# Patient Record
Sex: Female | Born: 1965 | Race: White | Hispanic: No | Smoking: Never smoker
Health system: Southern US, Community
[De-identification: ages and names within clinical notes are randomized; demographics above are authoritative.]

## PROBLEM LIST (undated history)

## (undated) DIAGNOSIS — I509 Heart failure, unspecified: Secondary | ICD-10-CM

## (undated) DIAGNOSIS — G4730 Sleep apnea, unspecified: Secondary | ICD-10-CM

## (undated) DIAGNOSIS — F32A Depression, unspecified: Secondary | ICD-10-CM

## (undated) DIAGNOSIS — I639 Cerebral infarction, unspecified: Secondary | ICD-10-CM

## (undated) DIAGNOSIS — F419 Anxiety disorder, unspecified: Secondary | ICD-10-CM

## (undated) DIAGNOSIS — Z9889 Other specified postprocedural states: Secondary | ICD-10-CM

## (undated) DIAGNOSIS — M359 Systemic involvement of connective tissue, unspecified: Secondary | ICD-10-CM

## (undated) DIAGNOSIS — I10 Essential (primary) hypertension: Secondary | ICD-10-CM

## (undated) DIAGNOSIS — I2699 Other pulmonary embolism without acute cor pulmonale: Secondary | ICD-10-CM

## (undated) DIAGNOSIS — N289 Disorder of kidney and ureter, unspecified: Secondary | ICD-10-CM

## (undated) DIAGNOSIS — R7303 Prediabetes: Secondary | ICD-10-CM

## (undated) DIAGNOSIS — R519 Headache, unspecified: Secondary | ICD-10-CM

## (undated) DIAGNOSIS — E039 Hypothyroidism, unspecified: Secondary | ICD-10-CM

## (undated) DIAGNOSIS — Z87442 Personal history of urinary calculi: Secondary | ICD-10-CM

## (undated) DIAGNOSIS — K769 Liver disease, unspecified: Secondary | ICD-10-CM

## (undated) DIAGNOSIS — A6004 Herpesviral vulvovaginitis: Secondary | ICD-10-CM

## (undated) DIAGNOSIS — E785 Hyperlipidemia, unspecified: Secondary | ICD-10-CM

## (undated) DIAGNOSIS — R112 Nausea with vomiting, unspecified: Secondary | ICD-10-CM

## (undated) DIAGNOSIS — K259 Gastric ulcer, unspecified as acute or chronic, without hemorrhage or perforation: Secondary | ICD-10-CM

## (undated) DIAGNOSIS — J449 Chronic obstructive pulmonary disease, unspecified: Secondary | ICD-10-CM

## (undated) DIAGNOSIS — K219 Gastro-esophageal reflux disease without esophagitis: Secondary | ICD-10-CM

## (undated) DIAGNOSIS — E538 Deficiency of other specified B group vitamins: Secondary | ICD-10-CM

## (undated) DIAGNOSIS — M1990 Unspecified osteoarthritis, unspecified site: Secondary | ICD-10-CM

## (undated) DIAGNOSIS — K5909 Other constipation: Secondary | ICD-10-CM

## (undated) DIAGNOSIS — R053 Chronic cough: Secondary | ICD-10-CM

## (undated) DIAGNOSIS — Z8711 Personal history of peptic ulcer disease: Secondary | ICD-10-CM

## (undated) DIAGNOSIS — K449 Diaphragmatic hernia without obstruction or gangrene: Secondary | ICD-10-CM

## (undated) DIAGNOSIS — I2724 Chronic thromboembolic pulmonary hypertension: Secondary | ICD-10-CM

## (undated) DIAGNOSIS — E559 Vitamin D deficiency, unspecified: Secondary | ICD-10-CM

## (undated) DIAGNOSIS — N393 Stress incontinence (female) (male): Secondary | ICD-10-CM

## (undated) DIAGNOSIS — Z8616 Personal history of COVID-19: Secondary | ICD-10-CM

## (undated) DIAGNOSIS — R569 Unspecified convulsions: Secondary | ICD-10-CM

## (undated) DIAGNOSIS — K649 Unspecified hemorrhoids: Secondary | ICD-10-CM

## (undated) DIAGNOSIS — N189 Chronic kidney disease, unspecified: Secondary | ICD-10-CM

## (undated) DIAGNOSIS — K760 Fatty (change of) liver, not elsewhere classified: Secondary | ICD-10-CM

## (undated) DIAGNOSIS — M797 Fibromyalgia: Secondary | ICD-10-CM

## (undated) DIAGNOSIS — F329 Major depressive disorder, single episode, unspecified: Secondary | ICD-10-CM

## (undated) DIAGNOSIS — I890 Lymphedema, not elsewhere classified: Secondary | ICD-10-CM

## (undated) DIAGNOSIS — R51 Headache: Secondary | ICD-10-CM

## (undated) DIAGNOSIS — Z8719 Personal history of other diseases of the digestive system: Secondary | ICD-10-CM

## (undated) HISTORY — DX: Unspecified convulsions: R56.9

## (undated) HISTORY — DX: Headache: R51

## (undated) HISTORY — DX: Anxiety disorder, unspecified: F41.9

## (undated) HISTORY — PX: CHOLECYSTECTOMY: SHX55

## (undated) HISTORY — DX: Hyperlipidemia, unspecified: E78.5

## (undated) HISTORY — PX: BREAST SURGERY: SHX581

## (undated) HISTORY — PX: LEFT HEART CATH: SHX5946

## (undated) HISTORY — DX: Personal history of other diseases of the digestive system: Z87.19

## (undated) HISTORY — PX: REDUCTION MAMMAPLASTY: SUR839

## (undated) HISTORY — PX: URETER SURGERY: SHX823

## (undated) HISTORY — PX: CARPAL TUNNEL RELEASE: SHX101

## (undated) HISTORY — PX: TONSILLECTOMY: SUR1361

## (undated) HISTORY — DX: Personal history of peptic ulcer disease: Z87.11

## (undated) HISTORY — DX: Headache, unspecified: R51.9

## (undated) HISTORY — DX: Liver disease, unspecified: K76.9

## (undated) HISTORY — DX: Major depressive disorder, single episode, unspecified: F32.9

## (undated) HISTORY — DX: Depression, unspecified: F32.A

---

## 1998-07-23 HISTORY — PX: VAGINAL HYSTERECTOMY: SUR661

## 2003-10-26 ENCOUNTER — Ambulatory Visit (HOSPITAL_COMMUNITY): Admission: RE | Admit: 2003-10-26 | Discharge: 2003-10-26 | Payer: Self-pay | Admitting: Gastroenterology

## 2003-11-02 ENCOUNTER — Ambulatory Visit (HOSPITAL_COMMUNITY): Admission: RE | Admit: 2003-11-02 | Discharge: 2003-11-02 | Payer: Self-pay | Admitting: Gastroenterology

## 2005-07-06 ENCOUNTER — Emergency Department: Payer: Self-pay | Admitting: Emergency Medicine

## 2005-07-07 ENCOUNTER — Other Ambulatory Visit: Payer: Self-pay

## 2006-01-04 ENCOUNTER — Encounter: Payer: Self-pay | Admitting: Physical Medicine & Rehabilitation

## 2006-01-20 ENCOUNTER — Encounter: Payer: Self-pay | Admitting: Physical Medicine & Rehabilitation

## 2006-02-16 ENCOUNTER — Emergency Department: Payer: Self-pay | Admitting: Emergency Medicine

## 2008-04-26 ENCOUNTER — Encounter: Admission: RE | Admit: 2008-04-26 | Discharge: 2008-04-26 | Payer: Self-pay | Admitting: Rheumatology

## 2008-04-30 ENCOUNTER — Emergency Department: Payer: Self-pay | Admitting: Emergency Medicine

## 2009-09-05 ENCOUNTER — Emergency Department: Payer: Self-pay | Admitting: Emergency Medicine

## 2011-05-30 DIAGNOSIS — N200 Calculus of kidney: Secondary | ICD-10-CM | POA: Insufficient documentation

## 2011-05-30 DIAGNOSIS — G5600 Carpal tunnel syndrome, unspecified upper limb: Secondary | ICD-10-CM | POA: Insufficient documentation

## 2012-07-09 ENCOUNTER — Ambulatory Visit: Payer: Self-pay | Admitting: Family Medicine

## 2012-07-18 ENCOUNTER — Ambulatory Visit: Payer: Self-pay | Admitting: Family Medicine

## 2012-10-08 DIAGNOSIS — M7918 Myalgia, other site: Secondary | ICD-10-CM | POA: Insufficient documentation

## 2012-10-09 ENCOUNTER — Ambulatory Visit: Payer: Self-pay | Admitting: Family Medicine

## 2012-10-23 DIAGNOSIS — E039 Hypothyroidism, unspecified: Secondary | ICD-10-CM | POA: Insufficient documentation

## 2012-10-23 DIAGNOSIS — M546 Pain in thoracic spine: Secondary | ICD-10-CM | POA: Insufficient documentation

## 2012-10-23 HISTORY — DX: Hypothyroidism, unspecified: E03.9

## 2012-11-15 ENCOUNTER — Ambulatory Visit: Payer: Self-pay | Admitting: Family Medicine

## 2013-01-14 ENCOUNTER — Ambulatory Visit: Payer: Self-pay | Admitting: Unknown Physician Specialty

## 2013-01-16 LAB — PATHOLOGY REPORT

## 2013-02-07 ENCOUNTER — Ambulatory Visit: Payer: Self-pay | Admitting: Unknown Physician Specialty

## 2013-02-13 ENCOUNTER — Ambulatory Visit: Payer: Self-pay | Admitting: Unknown Physician Specialty

## 2013-02-18 ENCOUNTER — Ambulatory Visit: Payer: Self-pay | Admitting: Unknown Physician Specialty

## 2013-08-04 ENCOUNTER — Emergency Department: Payer: Self-pay | Admitting: Emergency Medicine

## 2013-08-04 LAB — BASIC METABOLIC PANEL
Anion Gap: 6 — ABNORMAL LOW (ref 7–16)
BUN: 13 mg/dL (ref 7–18)
Calcium, Total: 9.6 mg/dL (ref 8.5–10.1)
Chloride: 106 mmol/L (ref 98–107)
Co2: 25 mmol/L (ref 21–32)
Creatinine: 0.74 mg/dL (ref 0.60–1.30)
EGFR (African American): >60
EGFR (Non-African Amer.): >60
Glucose: 76 mg/dL (ref 65–99)
Osmolality: 273 (ref 275–301)
Potassium: 3.6 mmol/L (ref 3.5–5.1)
Sodium: 137 mmol/L (ref 136–145)

## 2013-08-04 LAB — CBC WITH DIFFERENTIAL/PLATELET
Basophil #: 0.1 10*3/uL (ref 0.0–0.1)
Basophil %: 1.1 %
Eosinophil #: 0.2 10*3/uL (ref 0.0–0.7)
Eosinophil %: 1.6 %
HCT: 40.2 % (ref 35.0–47.0)
HGB: 13.9 g/dL (ref 12.0–16.0)
Lymphocyte #: 5.9 10*3/uL — ABNORMAL HIGH (ref 1.0–3.6)
Lymphocyte %: 54.2 %
MCH: 31.4 pg (ref 26.0–34.0)
MCHC: 34.5 g/dL (ref 32.0–36.0)
MCV: 91 fL (ref 80–100)
Monocyte #: 1.1 x10 3/mm — ABNORMAL HIGH (ref 0.2–0.9)
Monocyte %: 10.3 %
Neutrophil #: 3.6 10*3/uL (ref 1.4–6.5)
Neutrophil %: 32.8 %
Platelet: 247 10*3/uL (ref 150–440)
RBC: 4.42 10*6/uL (ref 3.80–5.20)
RDW: 13.3 % (ref 11.5–14.5)
WBC: 11 10*3/uL (ref 3.6–11.0)

## 2013-08-04 LAB — CK TOTAL AND CKMB (NOT AT ARMC)
CK, Total: 98 U/L (ref 21–215)
CK-MB: 0.9 ng/mL (ref 0.5–3.6)

## 2013-08-04 LAB — TSH: Thyroid Stimulating Horm: 2.12 u[IU]/mL

## 2013-08-04 LAB — TROPONIN I: Troponin-I: <0.02 ng/mL

## 2013-10-06 ENCOUNTER — Encounter: Payer: Self-pay | Admitting: Gastroenterology

## 2013-12-02 ENCOUNTER — Emergency Department: Payer: Self-pay | Admitting: Internal Medicine

## 2014-03-09 ENCOUNTER — Emergency Department: Payer: Self-pay | Admitting: Emergency Medicine

## 2014-03-09 LAB — URINALYSIS, COMPLETE
Bacteria: NONE SEEN
Bilirubin,UR: NEGATIVE
Blood: NEGATIVE
Glucose,UR: NEGATIVE mg/dL (ref 0–75)
Ketone: NEGATIVE
Leukocyte Esterase: NEGATIVE
Nitrite: NEGATIVE
Ph: 6 (ref 4.5–8.0)
Protein: NEGATIVE
RBC,UR: 4 /HPF (ref 0–5)
Specific Gravity: 1.025 (ref 1.003–1.030)
Squamous Epithelial: NONE SEEN
WBC UR: 1 /HPF (ref 0–5)

## 2014-03-09 LAB — COMPREHENSIVE METABOLIC PANEL
Albumin: 3.4 g/dL (ref 3.4–5.0)
Alkaline Phosphatase: 90 U/L
Anion Gap: 12 (ref 7–16)
BUN: 15 mg/dL (ref 7–18)
Bilirubin,Total: 0.2 mg/dL (ref 0.2–1.0)
Calcium, Total: 8.5 mg/dL (ref 8.5–10.1)
Chloride: 108 mmol/L — ABNORMAL HIGH (ref 98–107)
Co2: 21 mmol/L (ref 21–32)
Creatinine: 0.8 mg/dL (ref 0.60–1.30)
EGFR (African American): >60
EGFR (Non-African Amer.): >60
Glucose: 99 mg/dL (ref 65–99)
Osmolality: 282 (ref 275–301)
Potassium: 3.8 mmol/L (ref 3.5–5.1)
SGOT(AST): 27 U/L (ref 15–37)
SGPT (ALT): 40 U/L
Sodium: 141 mmol/L (ref 136–145)
Total Protein: 7.3 g/dL (ref 6.4–8.2)

## 2014-03-09 LAB — CBC
HCT: 38.4 % (ref 35.0–47.0)
HGB: 12.9 g/dL (ref 12.0–16.0)
MCH: 31.6 pg (ref 26.0–34.0)
MCHC: 33.6 g/dL (ref 32.0–36.0)
MCV: 94 fL (ref 80–100)
Platelet: 218 10*3/uL (ref 150–440)
RBC: 4.08 10*6/uL (ref 3.80–5.20)
RDW: 12.5 % (ref 11.5–14.5)
WBC: 7.5 10*3/uL (ref 3.6–11.0)

## 2014-03-17 DIAGNOSIS — R1013 Epigastric pain: Secondary | ICD-10-CM | POA: Insufficient documentation

## 2014-03-17 DIAGNOSIS — K219 Gastro-esophageal reflux disease without esophagitis: Secondary | ICD-10-CM | POA: Insufficient documentation

## 2014-03-17 DIAGNOSIS — E66813 Obesity, class 3: Secondary | ICD-10-CM | POA: Insufficient documentation

## 2014-03-17 DIAGNOSIS — K760 Fatty (change of) liver, not elsewhere classified: Secondary | ICD-10-CM | POA: Insufficient documentation

## 2014-04-05 ENCOUNTER — Ambulatory Visit: Payer: Self-pay | Admitting: Unknown Physician Specialty

## 2014-04-06 LAB — PATHOLOGY REPORT

## 2014-04-16 DIAGNOSIS — K259 Gastric ulcer, unspecified as acute or chronic, without hemorrhage or perforation: Secondary | ICD-10-CM | POA: Insufficient documentation

## 2014-06-14 ENCOUNTER — Ambulatory Visit: Payer: Self-pay | Admitting: Unknown Physician Specialty

## 2014-09-16 ENCOUNTER — Encounter: Payer: Self-pay | Admitting: Gastroenterology

## 2014-11-17 DIAGNOSIS — A6004 Herpesviral vulvovaginitis: Secondary | ICD-10-CM | POA: Insufficient documentation

## 2014-12-01 ENCOUNTER — Other Ambulatory Visit: Payer: Self-pay | Admitting: Family Medicine

## 2014-12-02 ENCOUNTER — Other Ambulatory Visit: Payer: Self-pay | Admitting: Family Medicine

## 2014-12-03 ENCOUNTER — Other Ambulatory Visit: Payer: Self-pay | Admitting: Family Medicine

## 2014-12-03 DIAGNOSIS — N644 Mastodynia: Secondary | ICD-10-CM

## 2015-02-23 ENCOUNTER — Ambulatory Visit
Admission: RE | Admit: 2015-02-23 | Discharge: 2015-02-23 | Disposition: A | Discharge status: Home / Self Care | Payer: 59 | Source: Ambulatory Visit | Attending: Unknown Physician Specialty | Admitting: Unknown Physician Specialty

## 2015-02-23 ENCOUNTER — Other Ambulatory Visit: Payer: Self-pay | Admitting: Unknown Physician Specialty

## 2015-02-23 DIAGNOSIS — R1013 Epigastric pain: Secondary | ICD-10-CM | POA: Diagnosis present

## 2015-02-23 MED ORDER — IOHEXOL 300 MG/ML  SOLN
100.0000 mL | Freq: Once | INTRAMUSCULAR | Status: AC | PRN
  Administered 2015-02-23: 100 mL via INTRAVENOUS

## 2015-04-14 ENCOUNTER — Encounter (HOSPITAL_BASED_OUTPATIENT_CLINIC_OR_DEPARTMENT_OTHER): Payer: Self-pay | Admitting: *Deleted

## 2015-04-15 ENCOUNTER — Ambulatory Visit (HOSPITAL_BASED_OUTPATIENT_CLINIC_OR_DEPARTMENT_OTHER)
Admission: RE | Admit: 2015-04-15 | Discharge: 2015-04-15 | Disposition: A | Discharge status: Home / Self Care | Payer: 59 | Source: Ambulatory Visit | Attending: Otolaryngology | Admitting: Otolaryngology

## 2015-04-15 ENCOUNTER — Encounter (HOSPITAL_BASED_OUTPATIENT_CLINIC_OR_DEPARTMENT_OTHER): Payer: Self-pay | Admitting: Anesthesiology

## 2015-04-15 ENCOUNTER — Ambulatory Visit (HOSPITAL_BASED_OUTPATIENT_CLINIC_OR_DEPARTMENT_OTHER): Payer: 59 | Admitting: Anesthesiology

## 2015-04-15 ENCOUNTER — Encounter (HOSPITAL_BASED_OUTPATIENT_CLINIC_OR_DEPARTMENT_OTHER)
Admission: RE | Disposition: A | Discharge status: Home / Self Care | Payer: Self-pay | Source: Ambulatory Visit | Attending: Otolaryngology

## 2015-04-15 DIAGNOSIS — Z6834 Body mass index (BMI) 34.0-34.9, adult: Secondary | ICD-10-CM | POA: Insufficient documentation

## 2015-04-15 DIAGNOSIS — E039 Hypothyroidism, unspecified: Secondary | ICD-10-CM | POA: Insufficient documentation

## 2015-04-15 DIAGNOSIS — D101 Benign neoplasm of tongue: Secondary | ICD-10-CM | POA: Diagnosis not present

## 2015-04-15 HISTORY — DX: Gastro-esophageal reflux disease without esophagitis: K21.9

## 2015-04-15 HISTORY — DX: Nausea with vomiting, unspecified: R11.2

## 2015-04-15 HISTORY — DX: Unspecified osteoarthritis, unspecified site: M19.90

## 2015-04-15 HISTORY — DX: Other specified postprocedural states: Z98.890

## 2015-04-15 HISTORY — PX: DIRECT LARYNGOSCOPY: SHX5326

## 2015-04-15 HISTORY — DX: Hypothyroidism, unspecified: E03.9

## 2015-04-15 HISTORY — DX: Nausea with vomiting, unspecified: Z98.890

## 2015-04-15 SURGERY — LARYNGOSCOPY, DIRECT
Anesthesia: General | Site: Mouth | Laterality: Left

## 2015-04-15 MED ORDER — ATROPINE SULFATE 0.4 MG/ML IJ SOLN
INTRAMUSCULAR | Status: AC
  Filled 2015-04-15: qty 1

## 2015-04-15 MED ORDER — SUCCINYLCHOLINE CHLORIDE 20 MG/ML IJ SOLN
INTRAMUSCULAR | Status: DC | PRN
  Administered 2015-04-15: 120 mg via INTRAVENOUS

## 2015-04-15 MED ORDER — MIDAZOLAM HCL 5 MG/5ML IJ SOLN
INTRAMUSCULAR | Status: DC | PRN
  Administered 2015-04-15: 2 mg via INTRAVENOUS

## 2015-04-15 MED ORDER — METHYLENE BLUE 1 % INJ SOLN
INTRAMUSCULAR | Status: AC
  Filled 2015-04-15: qty 10

## 2015-04-15 MED ORDER — LIDOCAINE HCL (CARDIAC) 20 MG/ML IV SOLN
INTRAVENOUS | Status: DC | PRN
  Administered 2015-04-15: 75 mg via INTRAVENOUS

## 2015-04-15 MED ORDER — OXYCODONE-ACETAMINOPHEN 5-325 MG PO TABS: 1.0000 | ORAL_TABLET | ORAL | Status: DC | PRN

## 2015-04-15 MED ORDER — PROPOFOL 10 MG/ML IV BOLUS
INTRAVENOUS | Status: DC | PRN
  Administered 2015-04-15: 200 mg via INTRAVENOUS

## 2015-04-15 MED ORDER — OXYMETAZOLINE HCL 0.05 % NA SOLN
NASAL | Status: DC | PRN
  Administered 2015-04-15: 1

## 2015-04-15 MED ORDER — PROMETHAZINE HCL 25 MG/ML IJ SOLN: 6.2500 mg | INTRAMUSCULAR | Status: DC | PRN

## 2015-04-15 MED ORDER — HYDROMORPHONE HCL 1 MG/ML IJ SOLN: 0.2500 mg | INTRAMUSCULAR | Status: DC | PRN

## 2015-04-15 MED ORDER — DEXAMETHASONE SODIUM PHOSPHATE 10 MG/ML IJ SOLN
INTRAMUSCULAR | Status: AC
  Filled 2015-04-15: qty 1

## 2015-04-15 MED ORDER — SUCCINYLCHOLINE CHLORIDE 20 MG/ML IJ SOLN
INTRAMUSCULAR | Status: AC
  Filled 2015-04-15: qty 1

## 2015-04-15 MED ORDER — OXYCODONE HCL 5 MG/5ML PO SOLN: 5.0000 mg | Freq: Once | ORAL | Status: AC | PRN

## 2015-04-15 MED ORDER — SCOPOLAMINE 1 MG/3DAYS TD PT72: 1.0000 | MEDICATED_PATCH | Freq: Once | TRANSDERMAL | Status: DC | PRN

## 2015-04-15 MED ORDER — ONDANSETRON HCL 4 MG/2ML IJ SOLN
INTRAMUSCULAR | Status: AC
  Filled 2015-04-15: qty 2

## 2015-04-15 MED ORDER — DEXAMETHASONE SODIUM PHOSPHATE 4 MG/ML IJ SOLN
INTRAMUSCULAR | Status: DC | PRN
  Administered 2015-04-15: 10 mg via INTRAVENOUS

## 2015-04-15 MED ORDER — GLYCOPYRROLATE 0.2 MG/ML IJ SOLN: 0.2000 mg | Freq: Once | INTRAMUSCULAR | Status: DC | PRN

## 2015-04-15 MED ORDER — LIDOCAINE-EPINEPHRINE 1 %-1:100000 IJ SOLN
INTRAMUSCULAR | Status: AC
  Filled 2015-04-15: qty 1

## 2015-04-15 MED ORDER — MIDAZOLAM HCL 2 MG/2ML IJ SOLN
INTRAMUSCULAR | Status: AC
  Filled 2015-04-15: qty 4

## 2015-04-15 MED ORDER — LACTATED RINGERS IV SOLN
INTRAVENOUS | Status: DC
  Administered 2015-04-15: 07:00:00 via INTRAVENOUS

## 2015-04-15 MED ORDER — EPINEPHRINE HCL 1 MG/ML IJ SOLN
INTRAMUSCULAR | Status: AC
  Filled 2015-04-15: qty 1

## 2015-04-15 MED ORDER — OXYMETAZOLINE HCL 0.05 % NA SOLN
NASAL | Status: AC
  Filled 2015-04-15: qty 15

## 2015-04-15 MED ORDER — FENTANYL CITRATE (PF) 100 MCG/2ML IJ SOLN
INTRAMUSCULAR | Status: AC
  Filled 2015-04-15: qty 4

## 2015-04-15 MED ORDER — FENTANYL CITRATE (PF) 100 MCG/2ML IJ SOLN
INTRAMUSCULAR | Status: DC | PRN
  Administered 2015-04-15: 100 ug via INTRAVENOUS

## 2015-04-15 MED ORDER — LIDOCAINE HCL (CARDIAC) 20 MG/ML IV SOLN
INTRAVENOUS | Status: AC
  Filled 2015-04-15: qty 5

## 2015-04-15 MED ORDER — PROPOFOL 500 MG/50ML IV EMUL
INTRAVENOUS | Status: AC
  Filled 2015-04-15: qty 50

## 2015-04-15 MED ORDER — OXYCODONE HCL 5 MG PO TABS
5.0000 mg | ORAL_TABLET | Freq: Once | ORAL | Status: AC | PRN
  Administered 2015-04-15: 5 mg via ORAL

## 2015-04-15 MED ORDER — BACITRACIN ZINC 500 UNIT/GM EX OINT
TOPICAL_OINTMENT | CUTANEOUS | Status: AC
  Filled 2015-04-15: qty 0.9

## 2015-04-15 MED ORDER — OXYCODONE HCL 5 MG PO TABS
ORAL_TABLET | ORAL | Status: AC
  Filled 2015-04-15: qty 1

## 2015-04-15 SURGICAL SUPPLY — 13 items
COAGULATOR SUCT 6 FR SWTCH (ELECTROSURGICAL) ×1
COAGULATOR SUCT SWTCH 10FR 6 (ELECTROSURGICAL) ×2 IMPLANT
COVER MAYO STAND STRL (DRAPES) ×3 IMPLANT
GLOVE BIOGEL PI IND STRL 7.5 (GLOVE) ×1 IMPLANT
GLOVE BIOGEL PI INDICATOR 7.5 (GLOVE) ×2
GLOVE SS BIOGEL STRL SZ 7.5 (GLOVE) ×1 IMPLANT
GLOVE SUPERSENSE BIOGEL SZ 7.5 (GLOVE) ×2
GLOVE SURG SS PI 7.5 STRL IVOR (GLOVE) ×3 IMPLANT
NEEDLE SPNL 25GX3.5 QUINCKE BL (NEEDLE) ×3 IMPLANT
SHEET MEDIUM DRAPE 40X70 STRL (DRAPES) ×3 IMPLANT
SYR CONTROL 10ML LL (SYRINGE) ×3 IMPLANT
TUBE CONNECTING 20'X1/4 (TUBING) ×1
TUBE CONNECTING 20X1/4 (TUBING) ×2 IMPLANT

## 2015-04-15 NOTE — Transfer of Care (Signed)
Immediate Anesthesia Transfer of Care Note  Patient: Tammy Young  Procedure(s) Performed: Procedure(s): DIRECT LARYNGOSCOPY BIOPSY AND LASER LEFT TONGUE LESION (Left)  Patient Location: PACU  Anesthesia Type:General  Level of Consciousness: awake, alert  and oriented  Airway & Oxygen Therapy: Patient Spontanous Breathing and Patient connected to face mask oxygen  Post-op Assessment: Report given to RN and Post -op Vital signs reviewed and stable  Post vital signs: Reviewed and stable  Last Vitals:  Filed Vitals:   04/15/15 0719  BP: 138/86  Pulse: 78  Temp: 36.8 C  Resp: 18    Complications: No apparent anesthesia complications

## 2015-04-15 NOTE — Anesthesia Preprocedure Evaluation (Addendum)
Anesthesia Evaluation  Patient identified by MRN, date of birth, ID band Patient awake    Reviewed: Allergy & Precautions, NPO status   History of Anesthesia Complications (+) PONV  Airway Mallampati: II  TM Distance: >3 FB Neck ROM: Full    Dental  (+) Dental Advisory Given   Pulmonary neg pulmonary ROS,    breath sounds clear to auscultation       Cardiovascular negative cardio ROS   Rhythm:Regular Rate:Normal     Neuro/Psych negative neurological ROS     GI/Hepatic Neg liver ROS, GERD  ,  Endo/Other  Hypothyroidism Morbid obesity  Renal/GU negative Renal ROS     Musculoskeletal  (+) Arthritis ,   Abdominal   Peds  Hematology negative hematology ROS (+)   Anesthesia Other Findings   Reproductive/Obstetrics                            Anesthesia Physical Anesthesia Plan  ASA: II  Anesthesia Plan: General   Post-op Pain Management:    Induction: Intravenous  Airway Management Planned: Oral ETT  Additional Equipment:   Intra-op Plan:   Post-operative Plan: Extubation in OR  Informed Consent: I have reviewed the patients History and Physical, chart, labs and discussed the procedure including the risks, benefits and alternatives for the proposed anesthesia with the patient or authorized representative who has indicated his/her understanding and acceptance.   Dental advisory given  Plan Discussed with: CRNA  Anesthesia Plan Comments:        Anesthesia Quick Evaluation

## 2015-04-15 NOTE — Op Note (Signed)
Preop/postop diagnosis: Left tongue lesion and hoarseness Procedure: Direct laryngoscopy and excision of left tongue lesion Anesthesia: Gen. Estimated blood loss less than 5 mL Indications: A 49 year old with a tongue lesion on the left side that has persisted for several months. She did not want to proceed with a biopsy in the office. She's had hoarseness previously and one would have her larynx looked at again. She was informed risks and benefits of the procedure and options were discussed all questions are answered and consent was obtained. Procedure: Patient was taken to the operating room placed in the supine position after general endotracheal tube anesthesia was examined with the anesthesia scope. The vocal cords looked clean and normal with no lesions. Subglottis look normal. The arytenoids and area epiglottic fold had no lesions. The epiglottis looked normal. The left tongue was examined and the area of ulceration was grasped with a forceps and sharp dissection to remove this area for biopsy. The area was then ablated with the laser and suction cautery was used to gain good hemostasis. The laser was set on 8. There was good hemostasis. She was awakened brought to recovery room in stable condition counts correct

## 2015-04-15 NOTE — Discharge Instructions (Addendum)
Call for an appointment in about 1 week. Gargle with salt water to cleanse the wound. The pain medication will not completely eliminate all of the discomfort in the tongue. It should be sore for approximately 7-10 days. Call if there's any questions and especially if there's any bleeding, swelling, inability to move the tongue, voice change, or increasing pain.   Post Anesthesia Home Care Instructions  Activity: Get plenty of rest for the remainder of the day. A responsible adult should stay with you for 24 hours following the procedure.  For the next 24 hours, DO NOT: -Drive a car -Paediatric nurse -Drink alcoholic beverages -Take any medication unless instructed by your physician -Make any legal decisions or sign important papers.  Meals: Start with liquid foods such as gelatin or soup. Progress to regular foods as tolerated. Avoid greasy, spicy, heavy foods. If nausea and/or vomiting occur, drink only clear liquids until the nausea and/or vomiting subsides. Call your physician if vomiting continues.  Special Instructions/Symptoms: Your throat may feel dry or sore from the anesthesia or the breathing tube placed in your throat during surgery. If this causes discomfort, gargle with warm salt water. The discomfort should disappear within 24 hours.  If you had a scopolamine patch placed behind your ear for the management of post- operative nausea and/or vomiting:  1. The medication in the patch is effective for 72 hours, after which it should be removed.  Wrap patch in a tissue and discard in the trash. Wash hands thoroughly with soap and water. 2. You may remove the patch earlier than 72 hours if you experience unpleasant side effects which may include dry mouth, dizziness or visual disturbances. 3. Avoid touching the patch. Wash your hands with soap and water after contact with the patch.

## 2015-04-15 NOTE — Anesthesia Postprocedure Evaluation (Signed)
  Anesthesia Post-op Note  Patient: Tammy Young  Procedure(s) Performed: Procedure(s): DIRECT LARYNGOSCOPY BIOPSY AND LASER LEFT TONGUE LESION (Left)  Patient Location: PACU  Anesthesia Type:General  Level of Consciousness: awake, alert  and oriented  Airway and Oxygen Therapy: Patient Spontanous Breathing  Post-op Pain: none  Post-op Assessment: Post-op Vital signs reviewed              Post-op Vital Signs: Reviewed  Last Vitals:  Filed Vitals:   04/15/15 0954  BP: 139/84  Pulse: 73  Temp: 36.5 C  Resp: 16    Complications: No apparent anesthesia complications

## 2015-04-15 NOTE — H&P (Signed)
Tammy Young is an 49 y.o. female.   Chief Complaint: sore in mouth  HPI: hx of 2 months of sore in the mouth. Wants removed in OR  Past Medical History  Diagnosis Date  . GERD (gastroesophageal reflux disease)   . Hypothyroidism   . Arthritis   . PONV (postoperative nausea and vomiting)     Past Surgical History  Procedure Laterality Date  . Vaginal hysterectomy  2000  . Cholecystectomy    . Ureter surgery    . Tonsillectomy    . Carpal tunnel release Bilateral     History reviewed. No pertinent family history. Social History:  reports that she has never smoked. She has never used smokeless tobacco. She reports that she does not drink alcohol or use illicit drugs.  Allergies: No Known Allergies  Medications Prior to Admission  Medication Sig Dispense Refill  . levothyroxine (SYNTHROID, LEVOTHROID) 25 MCG tablet Take 25 mcg by mouth daily before breakfast.    . pantoprazole (PROTONIX) 40 MG tablet Take 40 mg by mouth 2 (two) times daily.    . ranitidine (ZANTAC) 150 MG tablet Take 150 mg by mouth at bedtime.    . sucralfate (CARAFATE) 1 G tablet Take 1 g by mouth 4 (four) times daily -  with meals and at bedtime.      No results found for this or any previous visit (from the past 48 hour(s)). No results found.  Review of Systems  Constitutional: Negative.   HENT: Negative.   Eyes: Negative.   Respiratory: Negative.   Cardiovascular: Negative.   Skin: Negative.     Blood pressure 138/86, pulse 78, temperature 98.2 F (36.8 C), temperature source Oral, resp. rate 18, height 5\' 5"  (1.651 m), weight 93.169 kg (205 lb 6.4 oz), SpO2 99 %. Physical Exam  Constitutional: She appears well-developed and well-nourished.  HENT:  Head: Normocephalic and atraumatic.  Nose: Nose normal.  Eyes: Pupils are equal, round, and reactive to light.  Neck: Normal range of motion. Neck supple.  Cardiovascular: Normal rate.   Respiratory: Effort normal.  GI: Soft.  Musculoskeletal:  Normal range of motion.     Assessment/Plan Left tongue ulcer- has ulcer and needs biopsy. Discussed procedure  Melissa Montane 04/15/2015, 7:34 AM

## 2015-04-15 NOTE — Anesthesia Procedure Notes (Signed)
Procedure Name: Intubation Date/Time: 04/15/2015 7:54 AM Performed by: Melynda Ripple D Pre-anesthesia Checklist: Patient identified, Emergency Drugs available, Suction available and Patient being monitored Patient Re-evaluated:Patient Re-evaluated prior to inductionOxygen Delivery Method: Circle System Utilized Preoxygenation: Pre-oxygenation with 100% oxygen Intubation Type: IV induction Ventilation: Mask ventilation without difficulty Tube type: Oral Laser Tube: Laser Tube and Cuffed inflated with minimal occlusive pressure - saline Number of attempts: 1 Airway Equipment and Method: Stylet and Oral airway Placement Confirmation: ETT inserted through vocal cords under direct vision,  positive ETCO2 and breath sounds checked- equal and bilateral Tube secured with: Tape Dental Injury: Teeth and Oropharynx as per pre-operative assessment

## 2015-04-18 ENCOUNTER — Encounter (HOSPITAL_BASED_OUTPATIENT_CLINIC_OR_DEPARTMENT_OTHER): Payer: Self-pay | Admitting: Otolaryngology

## 2015-09-11 ENCOUNTER — Emergency Department: Payer: 59

## 2015-09-11 ENCOUNTER — Encounter: Payer: Self-pay | Admitting: Emergency Medicine

## 2015-09-11 ENCOUNTER — Emergency Department
Admission: EM | Admit: 2015-09-11 | Discharge: 2015-09-11 | Disposition: A | Discharge status: Home / Self Care | Payer: 59 | Attending: Emergency Medicine | Admitting: Emergency Medicine

## 2015-09-11 DIAGNOSIS — J040 Acute laryngitis: Secondary | ICD-10-CM | POA: Insufficient documentation

## 2015-09-11 DIAGNOSIS — Z79899 Other long term (current) drug therapy: Secondary | ICD-10-CM | POA: Insufficient documentation

## 2015-09-11 DIAGNOSIS — M79661 Pain in right lower leg: Secondary | ICD-10-CM | POA: Insufficient documentation

## 2015-09-11 DIAGNOSIS — R2241 Localized swelling, mass and lump, right lower limb: Secondary | ICD-10-CM | POA: Diagnosis not present

## 2015-09-11 DIAGNOSIS — J029 Acute pharyngitis, unspecified: Secondary | ICD-10-CM | POA: Diagnosis present

## 2015-09-11 LAB — URINALYSIS COMPLETE WITH MICROSCOPIC (ARMC ONLY)
Bacteria, UA: NONE SEEN
Bilirubin Urine: NEGATIVE
Glucose, UA: NEGATIVE mg/dL
Ketones, ur: NEGATIVE mg/dL
Leukocytes, UA: NEGATIVE
Nitrite: NEGATIVE
Protein, ur: NEGATIVE mg/dL
RBC / HPF: NONE SEEN RBC/hpf (ref 0–5)
Specific Gravity, Urine: 1.01 (ref 1.005–1.030)
Squamous Epithelial / LPF: NONE SEEN
WBC, UA: NONE SEEN WBC/hpf (ref 0–5)
pH: 5 (ref 5.0–8.0)

## 2015-09-11 LAB — BASIC METABOLIC PANEL
Anion gap: 7 (ref 5–15)
BUN: 13 mg/dL (ref 6–20)
CO2: 25 mmol/L (ref 22–32)
Calcium: 9.5 mg/dL (ref 8.9–10.3)
Chloride: 108 mmol/L (ref 101–111)
Creatinine, Ser: 0.81 mg/dL (ref 0.44–1.00)
GFR calc Af Amer: >60 mL/min (ref >60)
GFR calc non Af Amer: >60 mL/min (ref >60)
Glucose, Bld: 120 mg/dL — ABNORMAL HIGH (ref 65–99)
Potassium: 3.4 mmol/L — ABNORMAL LOW (ref 3.5–5.1)
Sodium: 140 mmol/L (ref 135–145)

## 2015-09-11 LAB — CBC
HCT: 38.6 % (ref 35.0–47.0)
Hemoglobin: 13.3 g/dL (ref 12.0–16.0)
MCH: 31.6 pg (ref 26.0–34.0)
MCHC: 34.3 g/dL (ref 32.0–36.0)
MCV: 91.9 fL (ref 80.0–100.0)
Platelets: 251 10*3/uL (ref 150–440)
RBC: 4.2 MIL/uL (ref 3.80–5.20)
RDW: 13 % (ref 11.5–14.5)
WBC: 10.5 10*3/uL (ref 3.6–11.0)

## 2015-09-11 LAB — TSH: TSH: 1.753 u[IU]/mL (ref 0.350–4.500)

## 2015-09-11 MED ORDER — BENZONATATE 100 MG PO CAPS: 100.0000 mg | ORAL_CAPSULE | Freq: Three times a day (TID) | ORAL | Status: DC

## 2015-09-11 NOTE — ED Notes (Signed)
Pt states that she can't wait any longer and she needs to leave. Pt states she will follow up with her PCP to check her ears.

## 2015-09-11 NOTE — ED Provider Notes (Signed)
[facility] Emergency Department Provider Note  ____________________________________________   I have reviewed the triage vital signs and the nursing notes.   HISTORY  Chief Complaint Dizziness and Leg Pain  hoarse voice   HPI Tammy Young is a 50 y.o. female who presents with primary complaint of "losing my voice "and sore throat. She reports over the past 2 days she has been losing her voice and has had a mild to moderate sore throat. She denies difficulty breathing. She denies fevers or chills. No recent sick contacts. She also complains of right calf swelling and pain. She reports it has been swollen before but recently it has started hurting. She denies a history of DVT. No recent travel.     Past Medical History  Diagnosis Date  . GERD (gastroesophageal reflux disease)   . Hypothyroidism   . Arthritis   . PONV (postoperative nausea and vomiting)     There are no active problems to display for this patient.   Past Surgical History  Procedure Laterality Date  . Vaginal hysterectomy  2000  . Cholecystectomy    . Ureter surgery    . Tonsillectomy    . Carpal tunnel release Bilateral   . Direct laryngoscopy Left 04/15/2015    Procedure: DIRECT LARYNGOSCOPY BIOPSY AND LASER LEFT TONGUE LESION;  Surgeon: Melissa Montane, MD;  Location: [place];  Service: ENT;  Laterality: Left;    Current Outpatient Rx  Name  Route  Sig  Dispense  Refill  . levothyroxine (SYNTHROID, LEVOTHROID) 25 MCG tablet   Oral   Take 25 mcg by mouth daily before breakfast.         . pantoprazole (PROTONIX) 40 MG tablet   Oral   Take 40 mg by mouth 2 (two) times daily.         . ranitidine (ZANTAC) 150 MG tablet   Oral   Take 150 mg by mouth at bedtime.         . sucralfate (CARAFATE) 1 G tablet   Oral   Take 1 g by mouth 4 (four) times daily -  with meals and at bedtime.           Allergies Review of patient's allergies indicates no  known allergies.  History reviewed. No pertinent family history.  Social History Social History  Substance Use Topics  . Smoking status: Never Smoker   . Smokeless tobacco: Never Used  . Alcohol Use: No    Review of Systems  Constitutional: Negative for fever. Eyes: Negative for visual changes. ENT: Positive for sore throat Cardiovascular: Negative for chest pain. Respiratory: Negative for shortness of breath. Gastrointestinal: Negative for abdominal pain Genitourinary: Negative for dysuria. Musculoskeletal: Negative for back pain. Skin: Negative for rash. Neurological: Negative for headaches  Psychiatric: No anxiety    ____________________________________________   PHYSICAL EXAM:  VITAL SIGNS: ED Triage Vitals  Enc Vitals Group     BP 09/11/15 1626 151/83 mmHg     Pulse Rate 09/11/15 1626 94     Resp 09/11/15 1626 20     Temp 09/11/15 1626 98.1 F (36.7 C)     Temp Source 09/11/15 1626 Oral     SpO2 09/11/15 1626 95 %     Weight 09/11/15 1626 213 lb (96.616 kg)     Height 09/11/15 1626 5\' 5"  (1.651 m)     Head Cir --      Peak Flow --      Pain Score 09/11/15  1626 4     Pain Loc --      Pain Edu? --      Excl. in [place]? --     Constitutional: Alert and oriented. Well appearing and in no distress. Eyes: Conjunctivae are normal.  ENT   Head: Normocephalic and atraumatic.   Mouth/Throat: Mucous membranes are moist. Pharynx is normal Cardiovascular: Normal rate, regular rhythm. Normal and symmetric distal pulses are present in all extremities. Respiratory: Normal respiratory effort without tachypnea nor retractions.  Gastrointestinal: Soft and non-tender in all quadrants.  Genitourinary: deferred Musculoskeletal: Nontender with normal range of motion in all extremities. Right calf is slightly swollen, no erythema, 2+ distal pulses Neurologic:  Normal speech and language. No gross focal neurologic deficits are appreciated. Skin:  Skin is warm, dry and  intact. No rash noted. Psychiatric: Mood and affect are normal. Patient exhibits appropriate insight and judgment.  ____________________________________________    LABS (pertinent positives/negatives)  Labs Reviewed  BASIC METABOLIC PANEL - Abnormal; Notable for the following:    Potassium 3.4 (*)    Glucose, Bld 120 (*)    All other components within normal limits  URINALYSIS COMPLETEWITH MICROSCOPIC ([facility] ONLY) - Abnormal; Notable for the following:    Color, Urine STRAW (*)    APPearance CLEAR (*)    Hgb urine dipstick 1+ (*)    All other components within normal limits  CBC  TSH  CBG MONITORING, ED    ____________________________________________   EKG  ED ECG REPORT I, Lavonia Drafts, the attending physician, personally viewed and interpreted this ECG.  Date: 09/11/2015 EKG Time: 4:38 PM Rate: 94 Rhythm: normal sinus rhythm QRS Axis: normal Intervals: normal ST/T Wave abnormalities: normal Conduction Disturbances: none Narrative Interpretation: unremarkable   ____________________________________________    RADIOLOGY I have personally reviewed any xrays that were ordered on this patient: Ultrasound of lower extremity is negative for DVT  ____________________________________________   PROCEDURES  Procedure(s) performed: none  Critical Care performed: none  ____________________________________________   INITIAL IMPRESSION / ASSESSMENT AND PLAN / ED COURSE  Pertinent labs & imaging results that were available during my care of the patient were reviewed by me and considered in my medical decision making (see chart for details).  History of present illness most consistent with laryngitis likely viral nature. We will obtain ultrasound of her extremity given swelling of the calf.  Ultrasound is unremarkable. Recommend supportive care for her likely viral illness. Lab work and EKG are  unremarkable  ____________________________________________   FINAL CLINICAL IMPRESSION(S) / ED DIAGNOSES  Final diagnoses:  Laryngitis  Calf pain, right     Lavonia Drafts, MD 09/11/15 2020

## 2015-09-11 NOTE — ED Notes (Addendum)
C/o sore throat and hoarseness.  Has been feeling dizzy; thinks might be r/t thyroid. Also has notable right calf swelling with pain to right calf. Thyroid levels last checked one year ago.

## 2015-09-11 NOTE — ED Notes (Signed)
Also c/o headaches

## 2015-09-11 NOTE — ED Notes (Signed)
Spoke with dr Archie Balboa about pt, will add tsh to labs.

## 2015-09-11 NOTE — ED Notes (Signed)
Started pt's discharge instructions, pt states she forgot to ask MD about her headaches and dizziness, wants MD to look in her ears. MD aware.

## 2015-09-11 NOTE — ED Notes (Signed)
Discussed discharge instructions, prescriptions, and follow-up care with patient. No questions or concerns at this time. Pt stable at discharge.  

## 2015-09-11 NOTE — Discharge Instructions (Signed)
Laryngitis  Laryngitis is inflammation of your vocal cords. This causes hoarseness, coughing, loss of voice, sore throat, or a dry throat. Your vocal cords are two bands of muscles that are found in your throat. When you speak, these cords come together and vibrate. These vibrations come out through your mouth as sound. When your vocal cords are inflamed, your voice sounds different.  Laryngitis can be temporary (acute) or long-term (chronic). Most cases of acute laryngitis improve with time. Chronic laryngitis is laryngitis that lasts for more than three weeks.  CAUSES  Acute laryngitis may be caused by:   A viral infection.   Lots of talking, yelling, or singing. This is also called vocal strain.   Bacterial infections.  Chronic laryngitis may be caused by:   Vocal strain.   Injury to your vocal cords.   Acid reflux (gastroesophageal reflux disease or GERD).   Allergies.   Sinus infection.   Smoking.   Alcohol abuse.   Breathing in chemicals or dust.   Growths on the vocal cords.  RISK FACTORS  Risk factors for laryngitis include:   Smoking.   Alcohol abuse.   Having allergies.  SIGNS AND SYMPTOMS  Symptoms of laryngitis may include:   Low, hoarse voice.   Loss of voice.   Dry cough.   Sore throat.   Stuffy nose.  DIAGNOSIS  Laryngitis may be diagnosed by:   Physical exam.   Throat culture.   Blood test.   Laryngoscopy. This procedure allows your health care provider to look at your vocal cords with a mirror or viewing tube.  TREATMENT  Treatment for laryngitis depends on what is causing it. Usually, treatment involves resting your voice and using medicines to soothe your throat. However, if your laryngitis is caused by a bacterial infection, you may need to take antibiotic medicine. If your laryngitis is caused by a growth, you may need to have a procedure to remove it.  HOME CARE INSTRUCTIONS   Drink enough fluid to keep your urine clear or pale yellow.   Breathe in moist air. Use a  humidifier if you live in a dry climate.   Take medicines only as directed by your health care provider.   If you were prescribed an antibiotic medicine, finish it all even if you start to feel better.   Do not smoke cigarettes or electronic cigarettes. If you need help quitting, ask your health care provider.   Talk as little as possible. Also avoid whispering, which can cause vocal strain.   Write instead of talking. Do this until your voice is back to normal.  SEEK MEDICAL CARE IF:   You have a fever.   You have increasing pain.   You have difficulty swallowing.  SEEK IMMEDIATE MEDICAL CARE IF:   You cough up blood.   You have trouble breathing.     This information is not intended to replace advice given to you by your health care provider. Make sure you discuss any questions you have with your health care provider.     Document Released: 07/09/2005 Document Revised: 07/30/2014 Document Reviewed: 12/22/2013  Elsevier Interactive Patient Education 2016 [employer]

## 2015-10-24 DIAGNOSIS — K649 Unspecified hemorrhoids: Secondary | ICD-10-CM | POA: Insufficient documentation

## 2015-10-24 DIAGNOSIS — J324 Chronic pansinusitis: Secondary | ICD-10-CM | POA: Insufficient documentation

## 2015-10-24 DIAGNOSIS — E785 Hyperlipidemia, unspecified: Secondary | ICD-10-CM | POA: Insufficient documentation

## 2015-10-24 DIAGNOSIS — G44221 Chronic tension-type headache, intractable: Secondary | ICD-10-CM | POA: Insufficient documentation

## 2015-11-16 ENCOUNTER — Ambulatory Visit (INDEPENDENT_AMBULATORY_CARE_PROVIDER_SITE_OTHER): Payer: 59 | Admitting: Neurology

## 2015-11-16 ENCOUNTER — Encounter: Payer: Self-pay | Admitting: Neurology

## 2015-11-16 VITALS — BP 158/97 | HR 88 | Ht 64.0 in | Wt 233.6 lb

## 2015-11-16 DIAGNOSIS — G44209 Tension-type headache, unspecified, not intractable: Secondary | ICD-10-CM

## 2015-11-16 DIAGNOSIS — R519 Headache, unspecified: Secondary | ICD-10-CM

## 2015-11-16 DIAGNOSIS — R51 Headache: Secondary | ICD-10-CM

## 2015-11-16 MED ORDER — TRAMADOL HCL 50 MG PO TABS: 100.0000 mg | ORAL_TABLET | Freq: Four times a day (QID) | ORAL | Status: DC | PRN

## 2015-11-16 MED ORDER — TOPIRAMATE 50 MG PO TABS: 100.0000 mg | ORAL_TABLET | Freq: Two times a day (BID) | ORAL | Status: DC

## 2015-11-16 NOTE — Patient Instructions (Signed)
I had a long discussion with the patient regarding her chronic daily headaches and discussed my evaluation and treatment plan and answered questions. I recommend she start Topamax 50 mg twice daily for headache prevention and have discussed side effects with her. Also tried tramadol 100 mg every 6 hourly when necessary but not more than 2 days per week for symptomatic treatment. I also advised her to increase participation in activities for stress relaxation-like regular exercise, swimming, medication and yoga. Check MRI scan of the brain for structural lesions and MRV for sinus thrombosis. She will return for follow-up in 2 months or call earlier if necessary  General Headache Without Cause A headache is pain or discomfort felt around the head or neck area. The specific cause of a headache may not be found. There are many causes and types of headaches. A few common ones are:  Tension headaches.  Migraine headaches.  Cluster headaches.  Chronic daily headaches. HOME CARE INSTRUCTIONS  Watch your condition for any changes. Take these steps to help with your condition: Managing Pain  Take over-the-counter and prescription medicines only as told by your health care provider.  Lie down in a dark, quiet room when you have a headache.  If directed, apply ice to the head and neck area:  Put ice in a plastic bag.  Place a towel between your skin and the bag.  Leave the ice on for 20 minutes, 2-3 times per day.  Use a heating pad or hot shower to apply heat to the head and neck area as told by your health care provider.  Keep lights dim if bright lights bother you or make your headaches worse. Eating and Drinking  Eat meals on a regular schedule.  Limit alcohol use.  Decrease the amount of caffeine you drink, or stop drinking caffeine. General Instructions  Keep all follow-up visits as told by your health care provider. This is important.  Keep a headache journal to help find out  what may trigger your headaches. For example, write down:  What you eat and drink.  How much sleep you get.  Any change to your diet or medicines.  Try massage or other relaxation techniques.  Limit stress.  Sit up straight, and do not tense your muscles.  Do not use tobacco products, including cigarettes, chewing tobacco, or e-cigarettes. If you need help quitting, ask your health care provider.  Exercise regularly as told by your health care provider.  Sleep on a regular schedule. Get 7-9 hours of sleep, or the amount recommended by your health care provider. SEEK MEDICAL CARE IF:   Your symptoms are not helped by medicine.  You have a headache that is different from the usual headache.  You have nausea or you vomit.  You have a fever. SEEK IMMEDIATE MEDICAL CARE IF:   Your headache becomes severe.  You have repeated vomiting.  You have a stiff neck.  You have a loss of vision.  You have problems with speech.  You have pain in the eye or ear.  You have muscular weakness or loss of muscle control.  You lose your balance or have trouble walking.  You feel faint or pass out.  You have confusion.   This information is not intended to replace advice given to you by your health care provider. Make sure you discuss any questions you have with your health care provider.   Document Released: 07/09/2005 Document Revised: 03/30/2015 Document Reviewed: 11/01/2014 Elsevier Interactive Patient Education 2016 [employer]vier  Inc[employer]  

## 2015-11-16 NOTE — Progress Notes (Signed)
[facility] [address] [place]. [place] [address] [phone]       OFFICE CONSULT NOTE  Tammy Young Date of Birth:  March 20, 1966 Medical Record Number:  [id number]   Referring MD:  Melissa Montane  Reason for Referral:  Headaches  HPI: Tammy Young is a 50 year obese Caucasian lady with history of chronic headaches off and on since age 50. She states the headaches have gotten particularly worse in the last 4-5 years and have been constant and daily now. She wakes up in the morning with a headache and may fluctuate a bit during the day with activity being 3/3-4 out of 5 on the mild side and 10/10 when severe. She cannot identify has any specific triggers or relieving factors. The headache is not accompanied by nausea, vomiting, light or sound sensitivity. The headache is not disabling and she is delivered to work and do her daily routine. He does not interfere with her sleep. She describes the headache has been mostly throbbing at times gets a tightness or pressure sensation. She denies any pain in neck: Neck tightness or spasm. She was recently seen by Dr. Janace Hoard from ENT and had a negative evaluation. She has tried multiple over-the-counter agents including Aleve, Tylenol, Advil, Excedrin Migraine as well as recently Percocet but without relief of her headache. She takes [facility] powder occasionally but states she does not take it more than a few times a month. She does admit to significant weight gain of nearly 50 pounds over the last 6 months despite being euthyroid. She denies any loss of vision, blurred vision, double vision, vertigo, focal extremity weakness numbness gait or balance problems. There is no history of significant recent head injury or fall. He does have a history of a closed head injury when she was a baby with a skull fracture requiring stitches on the scalp but she had no lasting residual deficits. She states that she was never formally diagnosed with migraines and  she did not have significant photophobia, phonophobia or nausea with her prior headaches as well. She did have a polysomnogram study done 5 years ago which did not show sleep apnea. She was evaluated and headache clinic at [facility]. She states that she was on several different pain medications which made her drowsy and sleepy and caused only temporary relief and headaches and hence she stopped all medications. She is unable to give me specific details of the medication names and I do not have any records to review even while looking in care everywhere [facility] records.  ROS:   14 system review of systems is positive for  headache, dizziness, confusion, restless legs, shortness of breath, blurred vision, weight gain, fatigue, leg swelling, spinning sensation and all other systems negative PMH:  Past Medical History  Diagnosis Date  . GERD (gastroesophageal reflux disease)   . Hypothyroidism   . Arthritis   . PONV (postoperative nausea and vomiting)   . Headache     Social History:  Social History   Social History  . Marital Status: Married    Spouse Name: N/A  . Number of Children: N/A  . Years of Education: N/A   Occupational History  . Not on file.   Social History Main Topics  . Smoking status: Never Smoker   . Smokeless tobacco: Never Used  . Alcohol Use: No  . Drug Use: No  . Sexual Activity: Yes   Other Topics Concern  .  Not on file   Social History Narrative    Medications:   Current Outpatient Prescriptions on File Prior to Visit  Medication Sig Dispense Refill  . levothyroxine (SYNTHROID, LEVOTHROID) 25 MCG tablet Take 25 mcg by mouth daily before breakfast.    . pantoprazole (PROTONIX) 40 MG tablet Take 40 mg by mouth 2 (two) times daily.    . ranitidine (ZANTAC) 150 MG tablet Take 150 mg by mouth at bedtime.    . sucralfate (CARAFATE) 1 G tablet Take 1 g by mouth 4 (four) times daily -  with meals and at  bedtime.     No current facility-administered medications on file prior to visit.    Allergies:  No Known Allergies  Physical Exam General: well developed, well nourished, seated, in no evident distress Head: head normocephalic and atraumatic.   Neck: supple with no carotid or supraclavicular bruits Cardiovascular: regular rate and rhythm, no murmurs Musculoskeletal: no deformity Skin:  no rash/petichiae Vascular:  Normal pulses all extremities  Neurologic Exam Mental Status: Awake and fully alert. Oriented to place and time. Recent and remote memory intact. Attention span, concentration and fund of knowledge appropriate. Mood and affect appropriate.  Cranial Nerves: Fundoscopic exam reveals sharp disc margins. Spontaneous vessel pulsations are noted on the disc. Pupils equal, briskly reactive to light. Extraocular movements full without nystagmus. Visual fields full to confrontation. Hearing intact. Facial sensation intact. Face, tongue, palate moves normally and symmetrically.  Motor: Normal bulk and tone. Normal strength in all tested extremity muscles. Sensory.: intact to touch , pinprick , position and vibratory sensation.  Coordination: Rapid alternating movements normal in all extremities. Finger-to-nose and heel-to-shin performed accurately bilaterally. Gait and Station: Arises from chair without difficulty. Stance is normal. Gait demonstrates normal stride length and balance . Able to heel, toe and tandem walk without difficulty.  Reflexes: 1+ and symmetric. Toes downgoing.      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 She has a life long history of similar headaches with   episodic flareups      PLAN: I had a long discussion with the patient regarding her chronic daily headaches and discussed my evaluation and treatment plan and answered questions. I recommend she start Topamax 50 mg twice daily for headache  prevention and have discussed side effects with her. Also tried tramadol 100 mg every 6 hourly when necessary but not more than 2 days per week for symptomatic treatment. I also advised her to increase participation in activities for stress relaxation-like regular exercise, swimming, medication and yoga. Check MRI scan of the brain for structural lesions and MRV for sinus thrombosis. Greater than 50% time during this 40 minute consultation was spent on counseling and coordination of care about her headaches and headache prevention and treatment She will return for follow-up in 2 months or call earlier if necessary Antony Contras, MD  [facility] [address] [place], [place] [address]  Phone [phone] Fax [phone]  Note: This document was prepared with digital dictation and possible smart phrase technology. Any transcriptional errors that result from this process are unintentional.

## 2015-11-21 ENCOUNTER — Other Ambulatory Visit: Payer: Self-pay | Admitting: Neurology

## 2015-11-21 ENCOUNTER — Telehealth: Payer: Self-pay | Admitting: Neurology

## 2015-11-21 DIAGNOSIS — G44021 Chronic cluster headache, intractable: Secondary | ICD-10-CM

## 2015-11-21 NOTE — Telephone Encounter (Signed)
Rn call patient back having side effects from topiramte. Pt stated she started taking it Friday, and the whole weekend. Pt stated the whole weekend she was dizzy, almost pass out. She had swimmers head to. Pt stated her past headache regimen from her neurologist was prednisone,trazodone,floricet-CD, metoclopramide-HCL,depakoete-ER, indomethacin,and none of these medications help her headache. Pt stated she did not take the topirmate today. Rn stated a message will be sent to the [place].

## 2015-11-21 NOTE — Telephone Encounter (Signed)
II spoke to the patient. She was inserted spoke to patient and advised to stay on topamax for few more dsys to see if she felt better.he has failed depakote, zanaflex, indomethacin, Prednisone in past. I will refer her to Dr Jaynee Eagles for second opinion. She voiced understanding

## 2015-11-21 NOTE — Telephone Encounter (Signed)
Patient is calling and Rx topiramate 50 mg you gave her is not working well for her. She states she is dizzy, swimmie-headed, and has almost passed out.  She states she has a 45 minute drive each way to work and is afraid to try to drive this.  She has discontinued the medication.   Also, she wanted to give you the medications another neurologist gave her in 2010.  They are:  Depakote-ER, irdomethacin, metoclopramide-HCL, tizanidine, predizone, trazodone, fioricet-CD.  Please call.

## 2015-11-21 NOTE — Telephone Encounter (Signed)
I'm more than happy to see patient, Tammy Young you can talk to emma to get her scheduled thanks

## 2015-11-22 ENCOUNTER — Telehealth: Payer: Self-pay | Admitting: Neurology

## 2015-11-22 NOTE — Telephone Encounter (Signed)
Spoke with patient that [facility] imaging facilities are in [place] or [place] and patient did not want to travel that far. Patient is keeping her appointment on 5/10 @ [facility]. Thanks!

## 2015-11-22 NOTE — Telephone Encounter (Signed)
Rn call patient back about seeing Dr.Ahern for a second opinion for headaches. Pt has tried 7 headaches regimens. Also she was recently prescribed topamax and is having side effects from it. Rn stated Dr.Sethi referred her to Dr. Jaynee Eagles for a second opinion. Pt agreed to see Dr.Ahern on 12/01/2015. Pt will be having MRI on 11/30/2015.

## 2015-11-22 NOTE — Telephone Encounter (Signed)
Pt called said she will need to pay $600 out of pocket for MRI at [facility] but if she could get MRI referred to somewhere associated with [facility] it would be 92% covered thru PAP. Please call

## 2015-11-24 ENCOUNTER — Telehealth: Payer: Self-pay | Admitting: Neurology

## 2015-11-24 ENCOUNTER — Encounter (HOSPITAL_BASED_OUTPATIENT_CLINIC_OR_DEPARTMENT_OTHER): Payer: Self-pay | Admitting: Emergency Medicine

## 2015-11-24 ENCOUNTER — Emergency Department (HOSPITAL_BASED_OUTPATIENT_CLINIC_OR_DEPARTMENT_OTHER)
Admission: EM | Admit: 2015-11-24 | Discharge: 2015-11-24 | Disposition: A | Discharge status: Home / Self Care | Payer: 59 | Attending: Emergency Medicine | Admitting: Emergency Medicine

## 2015-11-24 DIAGNOSIS — R51 Headache: Secondary | ICD-10-CM | POA: Diagnosis present

## 2015-11-24 DIAGNOSIS — M1990 Unspecified osteoarthritis, unspecified site: Secondary | ICD-10-CM | POA: Diagnosis not present

## 2015-11-24 DIAGNOSIS — E039 Hypothyroidism, unspecified: Secondary | ICD-10-CM | POA: Insufficient documentation

## 2015-11-24 DIAGNOSIS — R519 Headache, unspecified: Secondary | ICD-10-CM

## 2015-11-24 HISTORY — DX: Disorder of kidney and ureter, unspecified: N28.9

## 2015-11-24 MED ORDER — DIPHENHYDRAMINE HCL 50 MG/ML IJ SOLN
25.0000 mg | Freq: Once | INTRAMUSCULAR | Status: AC
  Administered 2015-11-24: 25 mg via INTRAVENOUS
  Filled 2015-11-24: qty 1

## 2015-11-24 MED ORDER — METOCLOPRAMIDE HCL 5 MG/ML IJ SOLN
10.0000 mg | Freq: Once | INTRAMUSCULAR | Status: AC
  Administered 2015-11-24: 10 mg via INTRAVENOUS
  Filled 2015-11-24: qty 2

## 2015-11-24 MED ORDER — KETOROLAC TROMETHAMINE 30 MG/ML IJ SOLN
30.0000 mg | Freq: Once | INTRAMUSCULAR | Status: AC
  Administered 2015-11-24: 30 mg via INTRAVENOUS
  Filled 2015-11-24: qty 1

## 2015-11-24 MED ORDER — SODIUM CHLORIDE 0.9 % IV BOLUS (SEPSIS)
1000.0000 mL | Freq: Once | INTRAVENOUS | Status: AC
  Administered 2015-11-24: 1000 mL via INTRAVENOUS

## 2015-11-24 NOTE — ED Notes (Signed)
Pt c/o recurrent headaches for past year, "some days worse than others".  Pt seen by neurology and rx'd a medication which she had to stop taking 3 days ago due to side effects. Pt has MRI scheduled for May 10th, but past 3 days pt has been tearful from pain.

## 2015-11-24 NOTE — Discharge Instructions (Signed)

## 2015-11-24 NOTE — Telephone Encounter (Signed)
I believe this was sent to me in error?

## 2015-11-24 NOTE — Telephone Encounter (Signed)
I do'nt have anything today at all, she should go to the ED.

## 2015-11-24 NOTE — Telephone Encounter (Signed)
Dr Jaynee Eagles- please advise if you would like me to do anything more. Pt has f/u with you next week

## 2015-11-24 NOTE — Telephone Encounter (Signed)
Pt called, she has appt with Dr Jaynee Eagles on 5/11- referred by Dr Leonie Man. Pt sts her head is pounding, she is teary-eyed. She said her supervisor said she should go to ED. Pt asked if she could be seen today. Dr Jaynee Eagles schedule is booked. Operator was unable to get in touch with RN, pt was advised to go to ED. FYI

## 2015-11-24 NOTE — ED Provider Notes (Signed)
CSN: [id number]     Arrival date & time 11/24/15  1037 History   First MD Initiated Contact with Patient 11/24/15 1047     Chief Complaint  Patient presents with  . Headache     (Consider location/radiation/quality/duration/timing/severity/associated sxs/prior Treatment) HPI Comments: Patient presents to the emergency department with chief complaint of headache. She states that she has a history of recurrent headaches. She is followed by neurology for these. She states that she has tried several different medications, but none have worked. She states that most recently she was taking Topamax, but states that this caused her to feel dizzy, so she discontinued it 3 days ago. She states that she has not had dizziness since discontinuing this medication. She has a follow-up appointment with a different neurologist on May 10, and is also scheduled to have outpatient MRI. She denies any associated fevers, chills, nausea, photophobia, phonophobia, neck stiffness, or ataxia. There are no modifying factors. Patient's states that she was sent to the ED by her employer, because she was crying at her workplace. Patient states that this headache is not unlike her other headaches, however she is just worried about the persistent nature.  The history is provided by the patient. No language interpreter was used.    Past Medical History  Diagnosis Date  . GERD (gastroesophageal reflux disease)   . Hypothyroidism   . Arthritis   . PONV (postoperative nausea and vomiting)   . Headache   . 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   Past Surgical History  Procedure Laterality Date  . Vaginal hysterectomy  2000  . Cholecystectomy    . Ureter surgery    . Tonsillectomy    . Carpal tunnel release Bilateral   . Direct laryngoscopy Left 04/15/2015    Procedure: DIRECT LARYNGOSCOPY BIOPSY AND LASER LEFT TONGUE LESION;  Surgeon: Melissa Montane, MD;  Location: [facility];  Service: ENT;  Laterality: Left;  . Breast surgery     Family History  Problem Relation Age of Onset  . Migraines Maternal Grandmother   . Stroke Maternal Grandmother    Social History  Substance Use Topics  . Smoking status: Never Smoker   . Smokeless tobacco: Never Used  . Alcohol Use: No   OB History    No data available     Review of Systems  Constitutional: Negative for fever and chills.  Respiratory: Negative for shortness of breath.   Cardiovascular: Negative for chest pain.  Gastrointestinal: Negative for nausea, vomiting, diarrhea and constipation.  Genitourinary: Negative for dysuria.  Neurological: Positive for headaches.  All other systems reviewed and are negative.     Allergies  Review of patient's allergies indicates not on file.  Home Medications   Prior to Admission medications   Medication Sig Start Date End Date Taking? Authorizing Provider  Cyanocobalamin (VITAMIN B 12 PO) Take 5,000 mg by mouth.    Historical Provider, MD  levothyroxine (SYNTHROID, LEVOTHROID) 25 MCG tablet Take 25 mcg by mouth daily before breakfast.    Historical Provider, MD  oxyCODONE-acetaminophen (PERCOCET/ROXICET) 5-325 MG tablet  04/15/15   Historical Provider, MD  pantoprazole (PROTONIX) 40 MG tablet Take 40 mg by mouth 2 (two) times daily.    Historical Provider, MD  phentermine (ADIPEX-P) 37.5 MG tablet Take by mouth. 10/13/15   Historical Provider, MD  ranitidine (ZANTAC) 150 MG tablet Take 150 mg by mouth at bedtime.  Historical Provider, MD  ranitidine (ZANTAC) 150 MG tablet Take by mouth.    Historical Provider, MD  sucralfate (CARAFATE) 1 G tablet Take 1 g by mouth 4 (four) times daily -  with meals and at bedtime.    Historical Provider, MD  topiramate (TOPAMAX) 50 MG tablet Take 2 tablets (100 mg total) by mouth 2 (two) times daily. 11/16/15   Garvin Fila, MD  traMADol (ULTRAM) 50 MG tablet Take 2 tablets (100 mg total) by mouth every 6 (six)  hours as needed. 11/16/15   Garvin Fila, MD   BP 157/97 mmHg  Pulse 87  Temp(Src) 98.2 F (36.8 C) (Oral)  Resp 18  Ht 5\' 4"  (1.626 m)  Wt 105.688 kg  BMI 39.97 kg/m2  SpO2 100% Physical Exam  Constitutional: She is oriented to person, place, and time. She appears well-developed and well-nourished.  HENT:  Head: Normocephalic and atraumatic.  Right Ear: External ear normal.  Left Ear: External ear normal.  Eyes: Conjunctivae and EOM are normal. Pupils are equal, round, and reactive to light.  Neck: Normal range of motion. Neck supple.  No pain with neck flexion, no meningismus  Cardiovascular: Normal rate, regular rhythm and normal heart sounds.  Exam reveals no gallop and no friction rub.   No murmur heard. Pulmonary/Chest: Effort normal and breath sounds normal. No respiratory distress. She has no wheezes. She has no rales. She exhibits no tenderness.  Abdominal: Soft. She exhibits no distension and no mass. There is no tenderness. There is no rebound and no guarding.  Musculoskeletal: Normal range of motion. She exhibits no edema or tenderness.  Normal gait  Neurological: She is alert and oriented to person, place, and time.  CN 3-12 intact, normal finger to nose, no pronator drift, sensation and strength intact bilaterally  Skin: Skin is warm and dry.  Psychiatric: She has a normal mood and affect. Her behavior is normal. Judgment and thought content normal.  Nursing note and vitals reviewed.   ED Course  Procedures (including critical care time)   MDM   Final diagnoses:  Chronic nonintractable headache, unspecified headache type    Patient with chronic recurrent headaches. Patient is followed by neurology. Their recent note and workup is reviewed.  Neurology is concerned for combination tension/migraine type headaches. Patient has outpatient MRI scheduled for 1 week. She has had negative CT head scans in the past several years. Patient is currently neurovascularly  intact. Her vital signs are stable. I doubt there is a need for emergent transfer for MRI today. Will treat patient's symptoms, and reassess.  12:16 PM Patient feels improved. Feel patient is stable for discharge today. She is neurovascularly intact. She has close follow-up with neurology.  Pt HA treated and improved while in ED.  Presentation is like pts typical HA and non concerning for [facility], ICH, Meningitis, or temporal arteritis. Pt is afebrile with no focal neuro deficits, nuchal rigidity, or change in vision. Pt is to follow up with PCP to discuss prophylactic medication. Pt verbalizes understanding and is agreeable with plan to dc.      Montine Circle, PA-C 11/24/15 1217  Ezequiel Essex, MD 11/24/15 1310

## 2015-11-30 ENCOUNTER — Other Ambulatory Visit: Payer: 59

## 2015-11-30 ENCOUNTER — Ambulatory Visit (INDEPENDENT_AMBULATORY_CARE_PROVIDER_SITE_OTHER): Payer: 59

## 2015-11-30 DIAGNOSIS — R51 Headache: Secondary | ICD-10-CM

## 2015-11-30 DIAGNOSIS — R519 Headache, unspecified: Secondary | ICD-10-CM

## 2015-12-01 ENCOUNTER — Encounter: Payer: Self-pay | Admitting: Neurology

## 2015-12-01 ENCOUNTER — Ambulatory Visit (INDEPENDENT_AMBULATORY_CARE_PROVIDER_SITE_OTHER): Payer: 59 | Admitting: Neurology

## 2015-12-01 ENCOUNTER — Telehealth: Payer: Self-pay | Admitting: Neurology

## 2015-12-01 VITALS — BP 170/90 | HR 74 | Ht 64.0 in | Wt 231.6 lb

## 2015-12-01 DIAGNOSIS — G43711 Chronic migraine without aura, intractable, with status migrainosus: Secondary | ICD-10-CM | POA: Diagnosis not present

## 2015-12-01 MED ORDER — GADOPENTETATE DIMEGLUMINE 469.01 MG/ML IV SOLN: 20.0000 mL | Freq: Once | INTRAVENOUS | Status: AC | PRN

## 2015-12-01 MED ORDER — PROPRANOLOL HCL ER 60 MG PO CP24: 60.0000 mg | ORAL_CAPSULE | Freq: Every day | ORAL | Status: DC

## 2015-12-01 NOTE — Telephone Encounter (Signed)
Patient called, states she was just here to see Dr. Jaynee Eagles and was supposed to get a Rx for a medication. Per nurse Heron Nay goes to her pharmacy electronically. Patient then wanted to know which pharmacy? [facility] or [facility]? Patient advised, [facility] [place].

## 2015-12-01 NOTE — Patient Instructions (Signed)
Remember to drink plenty of fluid, eat healthy meals and do not skip any meals. Try to eat protein with a every meal and eat a healthy snack such as fruit or nuts in between meals. Try to keep a regular sleep-wake schedule and try to exercise daily, particularly in the form of walking, 20-30 minutes a day, if you can.   As far as your medications are concerned, I would like to suggest Propranolol once daily at night.  I would like to see you back in 3 months, sooner if we need to. Please call us with any interim questions, concerns, problems, updates or refill requests.   Our phone number is [phone]. We also have an after hours call service for urgent matters and there is a physician on-call for urgent questions. For any emergencies you know to call 911 or go to the nearest emergency room

## 2015-12-01 NOTE — Progress Notes (Addendum)
[facility]    Provider:  Dr Jaynee Eagles Referring Provider: White, Corley Physician:  WHITE, Orlene Och, NP  CC:  Chronic headache  HPI:  Tammy Young is a 50 y.o. female here as a referral from Dr. Dema Severin for headaches. She has headaches that are throbbing, more on one or the other side but can also be all over the head. She has smell sensitivity. She has mild sensitivity to light, she has to wear sun glasses sometimes but does not need a dark room. But the light bothers her when directly coming at her. She can get some rare nausea as well. Headaches are daily and continuous. Started at the age of 41. in 2010 she started having them daily. She went to the headache center in [facility]. She has tried multiple medications. She has pressure and squeezing in her head and she has a feeling of brain freeze and tingling and bees stinging her. She does not take OTC medications daily, no more than 2-3x a week, no medication overuse headache. The headaches can be severe "12/10", on average they are 8/10 and right now is a 6/10. She has had weight gain. She has had a sleep study 7 years ago was negative, the headaches were occuring back then as well.  She has had multiple MRIs and evaluations even as far back as the age of 41 when she may have had a lumbar puncture. She doesn't know if she snores. She is very tired during the day. Her B12 was low and she takes supplementation which has helped with her fatigue. She is tired during the day. This is the worst the headaches have ever been. She saw an eye doctor several years ago and she just uses reading glasses.  Headache is worse when laying down maybe, she doesn't know. All she knows is that she has a headache 24x7. Throbbing in the morning. She stopped the Topamax due to side effects. No aura. No other focal neurologic deficits.  She has tried depakote, reglan, indomethacin, tizanidibe, prednisone, trazodone,  fioricet, Topamax. Amitriptyline sounds familiar but she doesn't know for sure.  Reviewed notes, labs and imaging from outside physicians, which showed: Personally reviewed MRI of the brain and MRV of the head completed 12/01/2015 which are both unremarkable, no signs of intracranial hypertension or other etiology for her headaches.  Review of Systems: Patient complains of symptoms per HPI as well as the following symptoms: Weight gain, fatigue, blurred vision, lymph nodes, swelling of legs, confusion, headache, dizziness. Pertinent negatives per HPI. All others negative.   Social History   Social History  . Marital Status: Married    Spouse Name: N/A  . Number of Children: 1  . Years of Education: GED   Occupational History  . [employer]    Social History Main Topics  . Smoking status: Never Smoker   . Smokeless tobacco: Never Used  . Alcohol Use: No  . Drug Use: No  . Sexual Activity: Yes   Other Topics Concern  . Not on file   Social History Narrative   Lives alone   Caffeine use: Drink 1 glass tea/day       Family History  Problem Relation Age of Onset  . Migraines Maternal Grandmother   . Stroke Maternal Grandmother     Past Medical History  Diagnosis Date  . GERD (gastroesophageal reflux disease)   . Hypothyroidism   . Arthritis   . PONV (postoperative nausea and vomiting)   .  Headache   . 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    Past Surgical History  Procedure Laterality Date  . Vaginal hysterectomy  2000  . Cholecystectomy    . Ureter surgery    . Tonsillectomy    . Carpal tunnel release Bilateral   . Direct laryngoscopy Left 04/15/2015    Procedure: DIRECT LARYNGOSCOPY BIOPSY AND LASER LEFT TONGUE LESION;  Surgeon: Melissa Montane, MD;  Location: [facility];  Service: ENT;  Laterality: Left;  . Breast surgery      Current Outpatient Prescriptions  Medication Sig Dispense  Refill  . Cyanocobalamin (VITAMIN B 12 PO) Take 5,000 mg by mouth.    . levothyroxine (SYNTHROID, LEVOTHROID) 25 MCG tablet Take 25 mcg by mouth daily before breakfast.    . oxyCODONE-acetaminophen (PERCOCET/ROXICET) 5-325 MG tablet Take 1 tablet by mouth every 6 (six) hours as needed.     . pantoprazole (PROTONIX) 40 MG tablet Take 40 mg by mouth 2 (two) times daily.    . ranitidine (ZANTAC) 150 MG tablet Take 150 mg by mouth at bedtime.    . sucralfate (CARAFATE) 1 G tablet Take 1 g by mouth 4 (four) times daily -  with meals and at bedtime.    . traMADol (ULTRAM) 50 MG tablet Take 2 tablets (100 mg total) by mouth every 6 (six) hours as needed. 30 tablet 1   No current facility-administered medications for this visit.   Facility-Administered Medications Ordered in Other Visits  Medication Dose Route Frequency Provider Last Rate Last Dose  . gadopentetate dimeglumine (MAGNEVIST) injection 20 mL  20 mL Intravenous Once PRN Garvin Fila, MD        Allergies as of 12/01/2015  . (No Known Allergies)    Vitals: BP 170/90 mmHg  Pulse 74  Ht 5\' 4"  (1.626 m)  Wt 231 lb 9.6 oz (105.053 kg)  BMI 39.73 kg/m2 Last Weight:  Wt Readings from Last 1 Encounters:  12/01/15 231 lb 9.6 oz (105.053 kg)   Last Height:   Ht Readings from Last 1 Encounters:  12/01/15 5\' 4"  (1.626 m)   Physical exam: Exam: Gen: NAD, conversant, well nourised, obese, well groomed                     CV: RRR, no MRG. No Carotid Bruits. No peripheral edema, warm, nontender Eyes: Conjunctivae clear without exudates or hemorrhage  Neuro: Detailed Neurologic Exam  Speech:    Speech is normal; fluent and spontaneous with normal comprehension.  Cognition:    The patient is oriented to person, place, and time;     recent and remote memory intact;     language fluent;     normal attention, concentration,     fund of knowledge Cranial Nerves:    The pupils are equal, round, and reactive to light. The fundi are  normal and spontaneous venous pulsations are present. Visual fields are full to finger confrontation. Extraocular movements are intact. Trigeminal sensation is intact and the muscles of mastication are normal. The face is symmetric. The palate elevates in the midline. Hearing intact. Voice is normal. Shoulder shrug is normal. The tongue has normal motion without fasciculations.   Coordination:    Normal finger to nose and heel to shin. Normal rapid alternating movements.   Gait:    Heel-toe and tandem gait are normal.   Motor Observation:    No asymmetry, no  atrophy, and no involuntary movements noted. Tone:    Normal muscle tone.    Posture:    Posture is normal. normal erect    Strength:    Strength is V/V in the upper and lower limbs.      Sensation: intact to LT     Reflex Exam:  DTR's:    Deep tendon reflexes in the upper and lower extremities are normal bilaterally.   Toes:    The toes are downgoing bilaterally.   Clonus:    Clonus is absent.       Assessment/Plan:  50 year old failed female with years of daily continuous headaches. We'll try propranolol. She is crying in the office today. Discussed that I'm happy to work with her, maintain a partnership to try to manage her headaches. But she has had these headaches for over 20 years and it may take time. We'll try propranolol, she is never tried that in the past. She could not tolerate Topamax and stopped taking it. Discussed Botox for migraines as well. Discussed evaluating for triggers, weight loss, diet, exercise and managing stress. Funduscopic exam is normal and MRI of the brain did not show any signs of intracranial hypertension or other etiology for her headaches. Also discussed nerve blocks and sphenopalatine nerve blocks.  Common propranolol side effects may include:nausea, vomiting, diarrhea, constipation, stomach cramps; decreased sex drive, impotence, or difficulty having an orgasm;sleep problems (insomnia); or  tired feeling. Do NOT get pregnant on this medication. Stop immediately for: slow or uneven heartbeats;a light-headed feeling, like you might pass out;wheezing or trouble breathing;shortness of breath (even with mild exertion), swelling, rapid weight gain;.sudden weakness, vision problems, or loss of coordination (especially in a child with hemangioma that affects the face or head);cold feeling in your hands and feet;depression, confusion, hallucinations;liver problems - nausea, upper stomach pain, itching, tired feeling, loss of appetite, dark urine, clay-colored stools, jaundice (yellowing of the skin or eyes);low blood sugar - headache, hunger, weakness, sweating, confusion, irritability, dizziness, fast heart rate, or feeling jittery;low blood sugar in a baby - pale skin, blue or purple skin, sweating, fussiness, crying, not wanting to eat, feeling cold, drowsiness, weak or shallow breathing (breathing may stop for short periods), seizure (convulsions), or loss of consciousness; or severe skin reaction - fever, sore throat, swelling in your face or tongue, burning in your eyes, skin pain, followed by a red or purple skin rash that spreads (especially in the face or upper body) and causes blistering and peeling.  To prevent or relieve headaches, try the following: Cool Compress. Lie down and place a cool compress on your head.  Avoid headache triggers. If certain foods or odors seem to have triggered your migraines in the past, avoid them. A headache diary might help you identify triggers.  Include physical activity in your daily routine. Try a daily walk or other moderate aerobic exercise.  Manage stress. Find healthy ways to cope with the stressors, such as delegating tasks on your to-do list.  Practice relaxation techniques. Try deep breathing, yoga, massage and visualization.  Eat regularly. Eating regularly scheduled meals and maintaining a healthy diet might help prevent headaches. Also, drink  plenty of fluids.  Follow a regular sleep schedule. Sleep deprivation might contribute to headaches Consider biofeedback. With this mind-body technique, you learn to control certain bodily functions - such as muscle tension, heart rate and blood pressure - to prevent headaches or reduce headache pain.    Proceed to emergency room if you experience new or  worsening symptoms or symptoms do not resolve, if you have new neurologic symptoms or if headache is severe, or for any concerning symptom.   CC: Eulogio Bear, NP  Sarina Ill, MD  [facility] [address] [place], [place] [address]  Phone [phone] Fax [phone]  A total of 40 minutes was spent face-to-face with this patient. Over half this time was spent on counseling patient on the intractable migraine diagnosis and different diagnostic and therapeutic options available.

## 2015-12-02 ENCOUNTER — Encounter: Payer: Self-pay | Admitting: Neurology

## 2015-12-02 DIAGNOSIS — G43711 Chronic migraine without aura, intractable, with status migrainosus: Secondary | ICD-10-CM | POA: Insufficient documentation

## 2015-12-27 ENCOUNTER — Encounter (HOSPITAL_BASED_OUTPATIENT_CLINIC_OR_DEPARTMENT_OTHER): Payer: Self-pay | Admitting: *Deleted

## 2015-12-27 ENCOUNTER — Emergency Department (HOSPITAL_BASED_OUTPATIENT_CLINIC_OR_DEPARTMENT_OTHER)
Admission: EM | Admit: 2015-12-27 | Discharge: 2015-12-27 | Disposition: A | Discharge status: Home / Self Care | Payer: 59 | Attending: Emergency Medicine | Admitting: Emergency Medicine

## 2015-12-27 DIAGNOSIS — M1990 Unspecified osteoarthritis, unspecified site: Secondary | ICD-10-CM | POA: Insufficient documentation

## 2015-12-27 DIAGNOSIS — Y939 Activity, unspecified: Secondary | ICD-10-CM | POA: Diagnosis not present

## 2015-12-27 DIAGNOSIS — E039 Hypothyroidism, unspecified: Secondary | ICD-10-CM | POA: Diagnosis not present

## 2015-12-27 DIAGNOSIS — W57XXXA Bitten or stung by nonvenomous insect and other nonvenomous arthropods, initial encounter: Secondary | ICD-10-CM | POA: Insufficient documentation

## 2015-12-27 DIAGNOSIS — Y999 Unspecified external cause status: Secondary | ICD-10-CM | POA: Insufficient documentation

## 2015-12-27 DIAGNOSIS — S30861A Insect bite (nonvenomous) of abdominal wall, initial encounter: Secondary | ICD-10-CM | POA: Insufficient documentation

## 2015-12-27 DIAGNOSIS — Y929 Unspecified place or not applicable: Secondary | ICD-10-CM | POA: Diagnosis not present

## 2015-12-27 DIAGNOSIS — S80861A Insect bite (nonvenomous), right lower leg, initial encounter: Secondary | ICD-10-CM | POA: Diagnosis not present

## 2015-12-27 MED ORDER — DOXYCYCLINE HYCLATE 100 MG PO CAPS: 100.0000 mg | ORAL_CAPSULE | Freq: Two times a day (BID) | ORAL | Status: AC

## 2015-12-27 MED FILL — DOXYCYCLINE MONO 100 MG CAP: 100 | 10 days supply | Qty: 20 | Fill #0

## 2015-12-27 NOTE — ED Notes (Signed)
She has several small raised areas that she states are tick bites. She removed one tick.

## 2015-12-27 NOTE — ED Provider Notes (Signed)
CSN: [id number]     Arrival date & time 12/27/15  1035 History   First MD Initiated Contact with Patient 12/27/15 1047     Chief Complaint  Patient presents with  . Insect Bite     (Consider location/radiation/quality/duration/timing/severity/associated sxs/prior Treatment) HPI   50 year old female presenting for evaluation of tick bite. Patient went on a fishing trip more than a week ago. She noticed a tick on her right upper abdomen 2 days ago which she initially thought it was a skin tag. She was able to successfully remove it but complaining of itchiness and skin irritation at the affected area. She also noticed 2 other skin irritation to her back and her right lower leg without any sign of tic. She endorses feeling generalized fatigue for the past several days and some mild abdominal discomfort. She denies having any fever, severe headache, focal numbness or weakness, nausea vomiting diarrhea. She has been using alcohol wipes to clean the skin. She has a total hysterectomy. She believes the tick may have been on the skin for more than a week. She cannot tell me if the tick was fully engorged when she removed it.  Past Medical History  Diagnosis Date  . GERD (gastroesophageal reflux disease)   . Hypothyroidism   . Arthritis   . PONV (postoperative nausea and vomiting)   . Headache   . 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   Past Surgical History  Procedure Laterality Date  . Vaginal hysterectomy  2000  . Cholecystectomy    . Ureter surgery    . Tonsillectomy    . Carpal tunnel release Bilateral   . Direct laryngoscopy Left 04/15/2015    Procedure: DIRECT LARYNGOSCOPY BIOPSY AND LASER LEFT TONGUE LESION;  Surgeon: Melissa Montane, MD;  Location: [facility];  Service: ENT;  Laterality: Left;  . Breast surgery     Family History  Problem Relation Age of Onset  . Migraines Maternal Grandmother   . Stroke Maternal  Grandmother    Social History  Substance Use Topics  . Smoking status: Never Smoker   . Smokeless tobacco: Never Used  . Alcohol Use: No   OB History    No data available     Review of Systems  Constitutional: Negative for fever.  Gastrointestinal: Positive for abdominal pain.  Skin: Negative for rash.  Neurological: Negative for headaches.      Allergies  Review of patient's allergies indicates no known allergies.  Home Medications   Prior to Admission medications   Medication Sig Start Date End Date Taking? Authorizing Provider  Cyanocobalamin (VITAMIN B 12 PO) Take 5,000 mg by mouth.    Historical Provider, MD  levothyroxine (SYNTHROID, LEVOTHROID) 25 MCG tablet Take 25 mcg by mouth daily before breakfast.    Historical Provider, MD  oxyCODONE-acetaminophen (PERCOCET/ROXICET) 5-325 MG tablet Take 1 tablet by mouth every 6 (six) hours as needed.  04/15/15   Historical Provider, MD  pantoprazole (PROTONIX) 40 MG tablet Take 40 mg by mouth 2 (two) times daily.    Historical Provider, MD  propranolol ER (INDERAL LA) 60 MG 24 hr capsule Take 1 capsule (60 mg total) by mouth daily. 12/01/15   Melvenia Beam, MD  ranitidine (ZANTAC) 150 MG tablet Take 150 mg by mouth at bedtime.    Historical Provider, MD  sucralfate (CARAFATE) 1 G tablet Take 1 g by mouth 4 (four) times daily -  with  meals and at bedtime.    Historical Provider, MD  traMADol (ULTRAM) 50 MG tablet Take 2 tablets (100 mg total) by mouth every 6 (six) hours as needed. 11/16/15   Garvin Fila, MD   BP 147/90 mmHg  Pulse 75  Temp(Src) 98.6 F (37 C) (Oral)  Resp 20  Ht 5' 4.5" (1.638 m)  Wt 104.781 kg  BMI 39.05 kg/m2  SpO2 99% Physical Exam  Constitutional: She is oriented to person, place, and time. She appears well-developed and well-nourished. No distress.  Caucasian female resting in bed in no acute discomfort.  HENT:  Head: Atraumatic.  Right Ear: External ear normal.  Left Ear: External ear normal.   Eyes: Conjunctivae are normal.  Neck: Neck supple.  Abdominal: Soft. She exhibits no distension. There is no tenderness.  Neurological: She is alert and oriented to person, place, and time.  Skin: No rash noted.  Localized skin irritation noted to the right upper abdominal region without any tick or rash. Similar localized skin irritation noted to the left lateral flank and right anterior lower leg without embedded tick.    Psychiatric: She has a normal mood and affect.  Nursing note and vitals reviewed.   ED Course  Procedures (including critical care time)   MDM   Final diagnoses:  Tick bites    BP 147/90 mmHg  Pulse 75  Temp(Src) 98.6 F (37 C) (Oral)  Resp 20  Ht 5' 4.5" (1.638 m)  Wt 104.781 kg  BMI 39.05 kg/m2  SpO2 99%   11:32 AM Patient with recent tick bite. Tick has been on her skin for more than 24 hours. She does not have any concerning rash, no fever or severe headache or polyarthralgia. However, due to the prolonged duration of tick exposure and concern, patient will be placed on doxycycline the next 10 days. Return precaution given. A full body skin examination was performed by myself and our nurse.  Domenic Moras, PA-C 12/27/15 Mystic Liu, MD 12/27/15 Carollee Massed

## 2015-12-27 NOTE — Discharge Instructions (Signed)
Tick Bite Information °Ticks are insects that attach themselves to the skin. There are many types of ticks. Common types include wood ticks and deer ticks. Sometimes, ticks carry diseases that can make a person very ill. The most common places for ticks to attach themselves are the scalp, neck, armpits, waist, and groin.  °HOW CAN YOU PREVENT TICK BITES? °Take these steps to help prevent tick bites when you are outdoors: °· Wear long sleeves and long pants. °· Wear white clothes so you can see ticks more easily. °· Tuck your pant legs into your socks. °· If walking on a trail, stay in the middle of the trail to avoid brushing against bushes. °· Avoid walking through areas with long grass. °· Put bug spray on all skin that is showing and along boot tops, pant legs, and sleeve cuffs. °· Check clothes, hair, and skin often and before going inside. °· Brush off any ticks that are not attached. °· Take a shower or bath as soon as possible after being outdoors. °HOW SHOULD YOU REMOVE A TICK? °Ticks should be removed as soon as possible to help prevent diseases. °1. If latex gloves are available, put them on before trying to remove a tick. °2. Use tweezers to grasp the tick as close to the skin as possible. You may also use curved forceps or a tick removal tool. Grasp the tick as close to its head as possible. Avoid grasping the tick on its body. °3. Pull gently upward until the tick lets go. Do not twist the tick or jerk it suddenly. This may break off the tick's head or mouth parts. °4. Do not squeeze or crush the tick's body. This could force disease-carrying fluids from the tick into your body. °5. After the tick is removed, wash the bite area and your hands with soap and water or alcohol. °6. Apply a small amount of antiseptic cream or ointment to the bite site. °7. Wash any tools that were used. °Do not try to remove a tick by applying a hot match, petroleum jelly, or fingernail polish to the tick. These methods do  not work. They may also increase the chances of disease being spread from the tick bite. °WHEN SHOULD YOU SEEK HELP? °Contact your health care provider if you are unable to remove a tick or if a part of the tick breaks off in the skin. °After a tick bite, you need to watch for signs and symptoms of diseases that can be spread by ticks. Contact your health care provider if you develop any of the following: °· Fever. °· Rash. °· Redness and puffiness (swelling) in the area of the tick bite. °· Tender, puffy lymph glands. °· Watery poop (diarrhea). °· Weight loss. °· Cough. °· Feeling more tired than normal (fatigue). °· Muscle, joint, or bone pain. °· Belly (abdominal) pain. °· Headache. °· Change in your level of consciousness. °· Trouble walking or moving your legs. °· Loss of feeling (numbness) in the legs. °· Loss of movement (paralysis). °· Shortness of breath. °· Confusion. °· Throwing up (vomiting) many times. °  °This information is not intended to replace advice given to you by your health care provider. Make sure you discuss any questions you have with your health care provider. °  °Document Released: 10/03/2009 Document Revised: 03/11/2013 Document Reviewed: 12/17/2012 °Elsevier Interactive Patient Education ©2016 [employer] ° °

## 2016-01-04 ENCOUNTER — Other Ambulatory Visit: Payer: Self-pay | Admitting: Neurology

## 2016-01-04 ENCOUNTER — Telehealth: Payer: Self-pay | Admitting: Neurology

## 2016-01-04 MED ORDER — PROPRANOLOL HCL ER 120 MG PO CP24: 120.0000 mg | ORAL_CAPSULE | Freq: Every day | ORAL | Status: DC

## 2016-01-04 NOTE — Telephone Encounter (Signed)
I increased her propranolol, called in prescription. Again, watch for CP, low blood pressure. Do not take if BP less than 120/80 or pulse less than 65 thanks

## 2016-01-04 NOTE — Telephone Encounter (Signed)
Dr Jaynee Eagles- FYI Called and spoke to pt. Relayed message below per Dr Jaynee Eagles. Pt in agreement to increase dose. She knows to stop med and call if she experiences any SE. She denies having home BP cuff to monitor  BP. I recommended to pick one up from local drug store to monitor BP. She verbalized understanding and states she will go look for one tonight.

## 2016-01-04 NOTE — Telephone Encounter (Signed)
Dr Jaynee Eagles- This is the pt I spoke to you about. You mentioned trying to increase her dose. Please advise. I can call pt back, thank you

## 2016-01-04 NOTE — Telephone Encounter (Signed)
Patient called to advise she has been taking propranolol ER (INDERAL LA) 60 MG 24 hr capsule for (1) month and headaches are no better.

## 2016-01-09 NOTE — Telephone Encounter (Signed)
Pt called said she went to pick up medication, it will cost &70/mth. She does not want to get a refill for a medication that doesn't work, she does not want to spend that money and the medication not work.

## 2016-01-09 NOTE — Telephone Encounter (Signed)
She doesn't have to pick up the medication. Unfortunately she has tried multiple medications over the last 20 years that have not worked. She has a follow up with me in august I can see her then unless she wants to see a nurse practioner beforehand. Was she asking for another medication? If not, i will see her in August.

## 2016-01-09 NOTE — Telephone Encounter (Signed)
Dr Jaynee Eagles- see phone note below. Pt last seen 12/01/15. Do you want her to come in and see NP? You are booked out I think until August for f/u's.

## 2016-01-10 NOTE — Telephone Encounter (Signed)
Spoke with pt on 6/19 at 1730. Relayed Dr Jaynee Eagles message. Pt declined making appt with NP at this time. She wants to think about her options and she is going to call back and let me know. Power was lost at [facility] and phone call got dropped.

## 2016-01-10 NOTE — Telephone Encounter (Signed)
Dr Jaynee Eagles- see below Called pt back. Apologized that phone cut off last night. Explained [facility] power/phones shut off. We lost power. She verbalized understanding. She stated she is going to call around today to different pharmacies to see if she can get med cheaper somewhere else. She will call back and let me know.

## 2016-01-11 ENCOUNTER — Other Ambulatory Visit: Payer: Self-pay | Admitting: Neurology

## 2016-01-11 NOTE — Telephone Encounter (Signed)
Will try effexor, discussed side effects. For migraines.      For Venlafaxine: provided UpToDate patient drug information handout and also discussed the following: .Call your doctor at once if you have: blurred vision, tunnel vision, eye pain or swelling, or seeing halos around lights; easy bruising, unusual bleeding (nose, mouth, vagina, or rectum), purple or red pinpoint spots under your skin; cough, chest tightness, trouble breathing; seizure (convulsions); high levels of serotonin in the body - agitation, hallucinations, fever, fast heart rate, overactive reflexes, nausea, vomiting, diarrhea, loss of coordination, fainting; low levels of sodium in the body - headache, confusion, slurred speech, severe weakness, vomiting, loss of coordination, feeling unsteady; or severe nervous system reaction - very stiff (rigid) muscles, high fever, sweating, confusion, fast or uneven heartbeats, tremors, feeling like you might pass out. Common venlafaxine side effects may include: vision changes; nausea, vomiting, diarrhea; changes in appetite or weight; dry mouth, yawning; dizziness, headache, anxiety, feeling nervous; fast heartbeats, tremors or shaking; sleep problems (insomnia), strange dreams, tired feeling; increased sweating; or decreased sex drive, impotence, or difficulty having an orgasm.

## 2016-01-12 ENCOUNTER — Telehealth: Payer: Self-pay | Admitting: Neurology

## 2016-01-12 ENCOUNTER — Encounter: Payer: Self-pay | Admitting: *Deleted

## 2016-01-12 ENCOUNTER — Other Ambulatory Visit: Payer: Self-pay | Admitting: *Deleted

## 2016-01-12 DIAGNOSIS — G43711 Chronic migraine without aura, intractable, with status migrainosus: Secondary | ICD-10-CM

## 2016-01-12 MED ORDER — VENLAFAXINE HCL ER 75 MG PO CP24: 75.0000 mg | ORAL_CAPSULE | Freq: Every day | ORAL | Status: DC

## 2016-01-12 NOTE — Telephone Encounter (Signed)
Katie with [facility] is calling for clarification on quanity for Rx venlafaxine XR 75 mg 24 hr.  Please call her [phone] or refax.  Thanks!

## 2016-01-12 NOTE — Telephone Encounter (Signed)
Sent clarified order with quantity 30 as requested.

## 2016-02-08 ENCOUNTER — Ambulatory Visit: Payer: 59 | Admitting: Neurology

## 2016-02-20 ENCOUNTER — Encounter: Payer: Self-pay | Admitting: Neurology

## 2016-03-01 ENCOUNTER — Ambulatory Visit: Payer: 59 | Admitting: Neurology

## 2016-05-03 ENCOUNTER — Encounter: Payer: 59 | Admitting: Neurology

## 2016-05-20 ENCOUNTER — Ambulatory Visit
Admission: EM | Admit: 2016-05-20 | Discharge: 2016-05-20 | Discharge status: Against Medical Advice | Payer: 59 | Attending: Family Medicine | Admitting: Family Medicine

## 2016-05-20 DIAGNOSIS — M79645 Pain in left finger(s): Secondary | ICD-10-CM

## 2016-05-20 NOTE — ED Triage Notes (Signed)
Per patient while giving her grandson a bath x 2 weeks ago hit left hand on door knob.

## 2016-05-20 NOTE — ED Provider Notes (Signed)
CSN: [id number]     Arrival date & time 05/20/16  1529 History   First MD Initiated Contact with Patient 05/20/16 1555     Chief Complaint  Patient presents with  . Hand Pain   (Consider location/radiation/quality/duration/timing/severity/associated sxs/prior Treatment) Pt came in for hitting her middle finger on a door 2 weeks ago states that she was not seen at the time of the injury. Pt has full ROM , no decrease sensation, denies any radiating pain, able to flex and extend digits. No swelling or bruising noted to digits. When I expressed to pt that I did not feel that she needed an x ray she began to state that it was due to she did not have any insurance. I expressed that I had not looked to see her insurance and I did not want to treat with unwarranted test for her. I explained that I could refer to a hand specialist to ensure it was no nerve or ligament damage and pt states she did not want me to refer her. Nurse was in room with [place]. Pt then decided to leave AMA and refused to give any other information. Discussed this with MD Alveta Heimlich       Past Medical History:  Diagnosis Date  . Arthritis   . GERD (gastroesophageal reflux disease)   . Headache   . Hypothyroidism   . PONV (postoperative nausea and vomiting)   . 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   Past Surgical History:  Procedure Laterality Date  . BREAST SURGERY    . CARPAL TUNNEL RELEASE Bilateral   . CHOLECYSTECTOMY    . DIRECT LARYNGOSCOPY Left 04/15/2015   Procedure: DIRECT LARYNGOSCOPY BIOPSY AND LASER LEFT TONGUE LESION;  Surgeon: Melissa Montane, MD;  Location: [facility];  Service: ENT;  Laterality: Left;  . TONSILLECTOMY    . URETER SURGERY    . VAGINAL HYSTERECTOMY  2000   Family History  Problem Relation Age of Onset  . Migraines Maternal Grandmother   . Stroke Maternal Grandmother    Social History  Substance Use Topics  . Smoking status: Never  Smoker  . Smokeless tobacco: Never Used  . Alcohol use No   OB History    No data available     Review of Systems  Constitutional: Negative.   Musculoskeletal:       Pain to lt long finger   Skin: Negative.   Neurological: Negative.     Allergies  Review of patient's allergies indicates no known allergies.  Home Medications   Prior to Admission medications   Medication Sig Start Date End Date Taking? Authorizing Provider  Cyanocobalamin (VITAMIN B 12 PO) Take 5,000 mg by mouth.   Yes Historical Provider, MD  levothyroxine (SYNTHROID, LEVOTHROID) 25 MCG tablet Take 25 mcg by mouth daily before breakfast.    Historical Provider, MD  oxyCODONE-acetaminophen (PERCOCET/ROXICET) 5-325 MG tablet Take 1 tablet by mouth every 6 (six) hours as needed.  04/15/15   Historical Provider, MD  pantoprazole (PROTONIX) 40 MG tablet Take 40 mg by mouth 2 (two) times daily.    Historical Provider, MD  ranitidine (ZANTAC) 150 MG tablet Take 150 mg by mouth at bedtime.    Historical Provider, MD  sucralfate (CARAFATE) 1 G tablet Take 1 g by mouth 4 (four) times daily -  with meals and at bedtime.    Historical Provider, MD  traMADol (ULTRAM) 50 MG tablet Take 2  tablets (100 mg total) by mouth every 6 (six) hours as needed. 11/16/15   Garvin Fila, MD  venlafaxine XR (EFFEXOR XR) 75 MG 24 hr capsule Take 1 capsule (75 mg total) by mouth daily with breakfast. 01/12/16   Melvenia Beam, MD   Meds Ordered and Administered this Visit  Medications - No data to display  There were no vitals taken for this visit. No data found.   Physical Exam  Constitutional: She appears well-developed.  Musculoskeletal: Normal range of motion.  Neurological: She is alert.  Skin: Skin is warm. Capillary refill takes less than 2 seconds.    Urgent Care Course   Clinical Course    Procedures (including critical care time)  Labs Review Labs Reviewed - No data to display  Imaging Review No results  found.           MDM   1. Finger pain, left    Discussed that I did not feel an x ray was needed and we could refer to a hand specialist to r/o nerve or ligament injury. Pt has full ROM no swelling, no decrease ROM, discussed splinting and pt did not want any further treatment she walked out leaving ama.     Melanee Left, NP 05/20/16 [phone]

## 2016-06-22 DIAGNOSIS — I10 Essential (primary) hypertension: Secondary | ICD-10-CM | POA: Insufficient documentation

## 2016-07-02 DIAGNOSIS — N183 Chronic kidney disease, stage 3 unspecified: Secondary | ICD-10-CM | POA: Insufficient documentation

## 2016-10-08 ENCOUNTER — Emergency Department: Payer: PRIVATE HEALTH INSURANCE

## 2016-10-08 ENCOUNTER — Encounter: Payer: Self-pay | Admitting: Emergency Medicine

## 2016-10-08 ENCOUNTER — Inpatient Hospital Stay
Admission: EM | Admit: 2016-10-08 | Discharge: 2016-10-10 | DRG: 661 | Disposition: A | Discharge status: Home / Self Care | Payer: PRIVATE HEALTH INSURANCE | Attending: Internal Medicine | Admitting: Internal Medicine

## 2016-10-08 DIAGNOSIS — E039 Hypothyroidism, unspecified: Secondary | ICD-10-CM | POA: Diagnosis present

## 2016-10-08 DIAGNOSIS — R109 Unspecified abdominal pain: Secondary | ICD-10-CM | POA: Diagnosis present

## 2016-10-08 DIAGNOSIS — N201 Calculus of ureter: Secondary | ICD-10-CM | POA: Diagnosis not present

## 2016-10-08 DIAGNOSIS — N202 Calculus of kidney with calculus of ureter: Secondary | ICD-10-CM | POA: Diagnosis present

## 2016-10-08 DIAGNOSIS — I10 Essential (primary) hypertension: Secondary | ICD-10-CM | POA: Diagnosis present

## 2016-10-08 DIAGNOSIS — R52 Pain, unspecified: Secondary | ICD-10-CM | POA: Diagnosis not present

## 2016-10-08 DIAGNOSIS — Z9049 Acquired absence of other specified parts of digestive tract: Secondary | ICD-10-CM

## 2016-10-08 DIAGNOSIS — Z823 Family history of stroke: Secondary | ICD-10-CM

## 2016-10-08 DIAGNOSIS — Z87442 Personal history of urinary calculi: Secondary | ICD-10-CM

## 2016-10-08 DIAGNOSIS — Z79899 Other long term (current) drug therapy: Secondary | ICD-10-CM

## 2016-10-08 DIAGNOSIS — Z419 Encounter for procedure for purposes other than remedying health state, unspecified: Secondary | ICD-10-CM

## 2016-10-08 DIAGNOSIS — Z90710 Acquired absence of both cervix and uterus: Secondary | ICD-10-CM

## 2016-10-08 DIAGNOSIS — K219 Gastro-esophageal reflux disease without esophagitis: Secondary | ICD-10-CM | POA: Diagnosis present

## 2016-10-08 DIAGNOSIS — N139 Obstructive and reflux uropathy, unspecified: Principal | ICD-10-CM | POA: Diagnosis present

## 2016-10-08 HISTORY — DX: Essential (primary) hypertension: I10

## 2016-10-08 LAB — URINALYSIS, ROUTINE W REFLEX MICROSCOPIC
Bacteria, UA: NONE SEEN
Bilirubin Urine: NEGATIVE
Glucose, UA: NEGATIVE mg/dL
Ketones, ur: NEGATIVE mg/dL
Leukocytes, UA: NEGATIVE
Nitrite: NEGATIVE
Protein, ur: NEGATIVE mg/dL
Specific Gravity, Urine: 1.01 (ref 1.005–1.030)
pH: 5 (ref 5.0–8.0)

## 2016-10-08 LAB — CBC
HCT: 41.6 % (ref 35.0–47.0)
HCT: 39.1 % (ref 35.0–47.0)
Hemoglobin: 14.8 g/dL (ref 12.0–16.0)
Hemoglobin: 13.7 g/dL (ref 12.0–16.0)
MCH: 32.4 pg (ref 26.0–34.0)
MCH: 32 pg (ref 26.0–34.0)
MCHC: 35.6 g/dL (ref 32.0–36.0)
MCHC: 35.1 g/dL (ref 32.0–36.0)
MCV: 91.1 fL (ref 80.0–100.0)
MCV: 91.3 fL (ref 80.0–100.0)
Platelets: 252 10*3/uL (ref 150–440)
Platelets: 207 10*3/uL (ref 150–440)
RBC: 4.57 MIL/uL (ref 3.80–5.20)
RBC: 4.28 MIL/uL (ref 3.80–5.20)
RDW: 12.7 % (ref 11.5–14.5)
RDW: 12.5 % (ref 11.5–14.5)
WBC: 8 10*3/uL (ref 3.6–11.0)
WBC: 8.5 10*3/uL (ref 3.6–11.0)

## 2016-10-08 LAB — COMPREHENSIVE METABOLIC PANEL
ALT: 30 U/L (ref 14–54)
AST: 28 U/L (ref 15–41)
Albumin: 4.4 g/dL (ref 3.5–5.0)
Alkaline Phosphatase: 77 U/L (ref 38–126)
Anion gap: 9 (ref 5–15)
BUN: 7 mg/dL (ref 6–20)
CO2: 22 mmol/L (ref 22–32)
Calcium: 9.2 mg/dL (ref 8.9–10.3)
Chloride: 107 mmol/L (ref 101–111)
Creatinine, Ser: 0.7 mg/dL (ref 0.44–1.00)
GFR calc Af Amer: >60 mL/min (ref >60)
GFR calc non Af Amer: >60 mL/min (ref >60)
Glucose, Bld: 127 mg/dL — ABNORMAL HIGH (ref 65–99)
Potassium: 3.7 mmol/L (ref 3.5–5.1)
Sodium: 138 mmol/L (ref 135–145)
Total Bilirubin: 0.6 mg/dL (ref 0.3–1.2)
Total Protein: 8.1 g/dL (ref 6.5–8.1)

## 2016-10-08 LAB — LIPASE, BLOOD: Lipase: 28 U/L (ref 11–51)

## 2016-10-08 LAB — POCT PREGNANCY, URINE: Preg Test, Ur: POSITIVE — AB

## 2016-10-08 LAB — CREATININE, SERUM
Creatinine, Ser: 0.69 mg/dL (ref 0.44–1.00)
GFR calc Af Amer: >60 mL/min (ref >60)
GFR calc non Af Amer: >60 mL/min (ref >60)

## 2016-10-08 LAB — HCG, QUANTITATIVE, PREGNANCY: hCG, Beta Chain, Quant, S: 4 m[IU]/mL (ref <5)

## 2016-10-08 MED ORDER — ENOXAPARIN SODIUM 40 MG/0.4ML ~~LOC~~ SOLN
40.0000 mg | SUBCUTANEOUS | Status: DC
  Administered 2016-10-09: 40 mg via SUBCUTANEOUS
  Filled 2016-10-08 (×2): qty 0.4

## 2016-10-08 MED ORDER — SUCRALFATE 1 G PO TABS
1.0000 g | ORAL_TABLET | Freq: Three times a day (TID) | ORAL | Status: DC
  Administered 2016-10-08 – 2016-10-10 (×7): 1 g via ORAL
  Filled 2016-10-08 (×7): qty 1

## 2016-10-08 MED ORDER — LEVOTHYROXINE SODIUM 25 MCG PO TABS
25.0000 ug | ORAL_TABLET | Freq: Every day | ORAL | Status: DC
  Administered 2016-10-09 – 2016-10-10 (×2): 25 ug via ORAL
  Filled 2016-10-08 (×2): qty 1

## 2016-10-08 MED ORDER — SODIUM CHLORIDE 0.9 % IV BOLUS (SEPSIS)
1000.0000 mL | Freq: Once | INTRAVENOUS | Status: AC
  Administered 2016-10-08: 1000 mL via INTRAVENOUS

## 2016-10-08 MED ORDER — MORPHINE SULFATE (PF) 4 MG/ML IV SOLN
4.0000 mg | Freq: Once | INTRAVENOUS | Status: AC
  Administered 2016-10-08: 4 mg via INTRAVENOUS
  Filled 2016-10-08: qty 1

## 2016-10-08 MED ORDER — SENNOSIDES-DOCUSATE SODIUM 8.6-50 MG PO TABS: 1.0000 | ORAL_TABLET | Freq: Every evening | ORAL | Status: DC | PRN

## 2016-10-08 MED ORDER — SODIUM CHLORIDE 0.9 % IV SOLN
INTRAVENOUS | Status: DC
  Administered 2016-10-08 – 2016-10-10 (×5): via INTRAVENOUS

## 2016-10-08 MED ORDER — HYDROCODONE-ACETAMINOPHEN 5-325 MG PO TABS
1.0000 | ORAL_TABLET | ORAL | Status: DC | PRN
  Administered 2016-10-08 – 2016-10-10 (×4): 2 via ORAL
  Filled 2016-10-08 (×4): qty 2

## 2016-10-08 MED ORDER — NAPROXEN 500 MG PO TABS: 500.0000 mg | ORAL_TABLET | Freq: Two times a day (BID) | ORAL | 2 refills | Status: DC

## 2016-10-08 MED ORDER — BISACODYL 5 MG PO TBEC: 5.0000 mg | DELAYED_RELEASE_TABLET | Freq: Every day | ORAL | Status: DC | PRN

## 2016-10-08 MED ORDER — ACETAMINOPHEN 650 MG RE SUPP: 650.0000 mg | Freq: Four times a day (QID) | RECTAL | Status: DC | PRN

## 2016-10-08 MED ORDER — MORPHINE SULFATE (PF) 2 MG/ML IV SOLN
2.0000 mg | INTRAVENOUS | Status: DC | PRN
  Administered 2016-10-08 – 2016-10-09 (×3): 2 mg via INTRAVENOUS
  Filled 2016-10-08 (×4): qty 1

## 2016-10-08 MED ORDER — ONDANSETRON HCL 4 MG/2ML IJ SOLN
INTRAMUSCULAR | Status: AC
  Administered 2016-10-08: 4 mg via INTRAVENOUS
  Filled 2016-10-08: qty 2

## 2016-10-08 MED ORDER — HYDROCODONE-ACETAMINOPHEN 5-325 MG PO TABS
1.0000 | ORAL_TABLET | ORAL | 0 refills | Status: DC | PRN
Start: 2016-10-08 — End: 2016-10-10

## 2016-10-08 MED ORDER — ACETAMINOPHEN 325 MG PO TABS: 650.0000 mg | ORAL_TABLET | Freq: Four times a day (QID) | ORAL | Status: DC | PRN

## 2016-10-08 MED ORDER — PANTOPRAZOLE SODIUM 40 MG PO TBEC
40.0000 mg | DELAYED_RELEASE_TABLET | Freq: Two times a day (BID) | ORAL | Status: DC
  Administered 2016-10-08 – 2016-10-10 (×4): 40 mg via ORAL
  Filled 2016-10-08 (×4): qty 1

## 2016-10-08 MED ORDER — MORPHINE SULFATE (PF) 4 MG/ML IV SOLN
4.0000 mg | Freq: Once | INTRAVENOUS | Status: AC
  Administered 2016-10-08: 4 mg via INTRAVENOUS

## 2016-10-08 MED ORDER — KETOROLAC TROMETHAMINE 30 MG/ML IJ SOLN
30.0000 mg | Freq: Four times a day (QID) | INTRAMUSCULAR | Status: DC | PRN
  Administered 2016-10-09 (×2): 30 mg via INTRAVENOUS
  Filled 2016-10-08 (×2): qty 1

## 2016-10-08 MED ORDER — ONDANSETRON HCL 4 MG/2ML IJ SOLN
4.0000 mg | Freq: Once | INTRAMUSCULAR | Status: AC
  Administered 2016-10-08: 4 mg via INTRAVENOUS

## 2016-10-08 MED ORDER — KETOROLAC TROMETHAMINE 30 MG/ML IJ SOLN
30.0000 mg | Freq: Once | INTRAMUSCULAR | Status: AC
  Administered 2016-10-08: 30 mg via INTRAVENOUS
  Filled 2016-10-08: qty 1

## 2016-10-08 MED ORDER — LOSARTAN POTASSIUM 50 MG PO TABS
50.0000 mg | ORAL_TABLET | Freq: Every day | ORAL | Status: DC
  Administered 2016-10-08: 50 mg via ORAL
  Filled 2016-10-08: qty 1

## 2016-10-08 MED ORDER — HYDRALAZINE HCL 20 MG/ML IJ SOLN: 10.0000 mg | Freq: Four times a day (QID) | INTRAMUSCULAR | Status: DC | PRN

## 2016-10-08 MED ORDER — MORPHINE SULFATE (PF) 4 MG/ML IV SOLN
INTRAVENOUS | Status: AC
  Administered 2016-10-08: 4 mg via INTRAVENOUS
  Filled 2016-10-08: qty 1

## 2016-10-08 NOTE — ED Provider Notes (Addendum)
[facility] Emergency Department Provider Note   ____________________________________________    I have reviewed the triage vital signs and the nursing notes.   HISTORY  Chief Complaint Back Pain   HPI Tammy Young is a 51 y.o. female who presents with complaints of acute right flank pain. Patient reports she has a history of kidney stones. She reports she passed a small one over the weekend but kept having intermittent right flank pain. This morning he became very severe, and sharp in nature. She denies dysuria or fevers. History of appendectomy and cholecystectomy. Mild nausea from pain.   Past Medical History:  Diagnosis Date  . Arthritis   . GERD (gastroesophageal reflux disease)   . Headache   . Hypothyroidism   . PONV (postoperative nausea and vomiting)   . 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    Patient Active Problem List   Diagnosis Date Noted  . Intractable chronic migraine without aura and with status migrainosus 12/02/2015  . Chronic daily headache 11/16/2015  . Tension headache 11/16/2015    Past Surgical History:  Procedure Laterality Date  . BREAST SURGERY    . CARPAL TUNNEL RELEASE Bilateral   . CHOLECYSTECTOMY    . DIRECT LARYNGOSCOPY Left 04/15/2015   Procedure: DIRECT LARYNGOSCOPY BIOPSY AND LASER LEFT TONGUE LESION;  Surgeon: Melissa Montane, MD;  Location: [place];  Service: ENT;  Laterality: Left;  . TONSILLECTOMY    . URETER SURGERY    . VAGINAL HYSTERECTOMY  2000    Prior to Admission medications   Medication Sig Start Date End Date Taking? Authorizing Provider  levothyroxine (SYNTHROID, LEVOTHROID) 25 MCG tablet Take 25 mcg by mouth daily before breakfast.   Yes Historical Provider, MD  pantoprazole (PROTONIX) 40 MG tablet Take 40 mg by mouth 2 (two) times daily.   Yes Historical Provider, MD  ranitidine (ZANTAC) 150 MG tablet Take 150 mg by mouth  at bedtime.   Yes Historical Provider, MD  sucralfate (CARAFATE) 1 G tablet Take 1 g by mouth 4 (four) times daily -  with meals and at bedtime.   Yes Historical Provider, MD  Cyanocobalamin (VITAMIN B 12 PO) Take 5,000 mg by mouth.    Historical Provider, MD  HYDROcodone-acetaminophen (NORCO/VICODIN) 5-325 MG tablet Take 1 tablet by mouth every 4 (four) hours as needed for moderate pain. 10/08/16   Lavonia Drafts, MD  naproxen (NAPROSYN) 500 MG tablet Take 1 tablet (500 mg total) by mouth 2 (two) times daily with a meal. 10/08/16   Lavonia Drafts, MD     Allergies Patient has no known allergies.  Family History  Problem Relation Age of Onset  . Migraines Maternal Grandmother   . Stroke Maternal Grandmother     Social History Social History  Substance Use Topics  . Smoking status: Never Smoker  . Smokeless tobacco: Never Used  . Alcohol use No    Review of Systems  Constitutional: No fever/chills Eyes: No visual changes.   Cardiovascular: Denies chest pain. Respiratory: Denies shortness of breath. Gastrointestinal: As above  Genitourinary: Negative for dysuria. Musculoskeletal: As above Skin: Negative for rash. Neurological: Negative for headaches  10-point ROS otherwise negative.  ____________________________________________   PHYSICAL EXAM:  VITAL SIGNS: ED Triage Vitals  Enc Vitals Group     BP 10/08/16 0647 (!) 177/107     Pulse Rate 10/08/16 0647 86     Resp 10/08/16 0647 18  Temp 10/08/16 0647 98 F (36.7 C)     Temp Source 10/08/16 0647 Oral     SpO2 10/08/16 0647 97 %     Weight 10/08/16 0648 213 lb (96.6 kg)     Height 10/08/16 0648 5\' 5"  (1.651 m)     Head Circumference --      Peak Flow --      Pain Score 10/08/16 0648 9     Pain Loc --      Pain Edu? --      Excl. in [place]? --     Constitutional: Alert and oriented. In obvious pain, no acute distress Eyes: Conjunctivae are normal.   Nose: No congestion/rhinnorhea. Mouth/Throat: Mucous  membranes are moist.    Cardiovascular: Normal rate, regular rhythm. Grossly normal heart sounds.  Good peripheral circulation. Respiratory: Normal respiratory effort.  No retractions. Lungs CTAB. Gastrointestinal: Soft and nontender. No distention.  No CVA tenderness. Genitourinary: deferred Musculoskeletal: No lower extremity tenderness nor edema.  Warm and well perfused Neurologic:  Normal speech and language. No gross focal neurologic deficits are appreciated.  Skin:  Skin is warm, dry and intact. No rash noted. Psychiatric: Mood and affect are normal. Speech and behavior are normal.  ____________________________________________   LABS (all labs ordered are listed, but only abnormal results are displayed)  Labs Reviewed  URINALYSIS, ROUTINE W REFLEX MICROSCOPIC - Abnormal; Notable for the following:       Result Value   Color, Urine YELLOW (*)    APPearance CLEAR (*)    Hgb urine dipstick LARGE (*)    Squamous Epithelial / LPF 0-5 (*)    All other components within normal limits  COMPREHENSIVE METABOLIC PANEL - Abnormal; Notable for the following:    Glucose, Bld 127 (*)    All other components within normal limits  POCT PREGNANCY, URINE - Abnormal; Notable for the following:    Preg Test, Ur POSITIVE (*)    All other components within normal limits  LIPASE, BLOOD  CBC  HCG, QUANTITATIVE, PREGNANCY  POC URINE PREG, ED   ____________________________________________  EKG  None ____________________________________________  RADIOLOGY  CT renal stone study pending ____________________________________________   PROCEDURES  Procedure(s) performed: No    Critical Care performed: No ____________________________________________   INITIAL IMPRESSION / ASSESSMENT AND PLAN / ED COURSE  Pertinent labs & imaging results that were available during my care of the patient were reviewed by me and considered in my medical decision making (see chart for  details).  Patient presents with right flank pain, and obvious pain. IV morphine and IV Zofran ordered. Presentation strongly suspicious for kidney stone, we will give IV fluids, check labs urine CT renal stone study and reevaluate    Patient reports improvement in pain but still is quite uncomfortable despite having had multiple doses of IV pain medications. I'll discuss with urology and admit to medicine for pain control.  Discussed with Dr. Zannie Cove of Urology he will see the patient in the hospital.  ____________________________________________   FINAL CLINICAL IMPRESSION(S) / ED DIAGNOSES  Final diagnoses:  Acute right flank pain  Ureterolithiasis  Intractable pain      NEW MEDICATIONS STARTED DURING THIS VISIT:  New Prescriptions   HYDROCODONE-ACETAMINOPHEN (NORCO/VICODIN) 5-325 MG TABLET    Take 1 tablet by mouth every 4 (four) hours as needed for moderate pain.   NAPROXEN (NAPROSYN) 500 MG TABLET    Take 1 tablet (500 mg total) by mouth 2 (two) times daily with a meal.  Note:  This document was prepared using Dragon voice recognition software and may include unintentional dictation errors.    Lavonia Drafts, MD 10/08/16 1204    Lavonia Drafts, MD 10/08/16 [phone]

## 2016-10-08 NOTE — Consult Note (Signed)
Urology Consult  I have been asked to see the patient by Dr. Corky Downs, for evaluation and management of ureteral stone, flank pain.  Chief Complaint: right flank pain  History of Present Illness: Tammy Young is a 51 y.o. year old with a history of nephrolithiasis presented to the emergency room today with worsening right flank pain since Friday. Noncontrast CT scan shows a 4 mm (possibly 2 adjacent fragments) with mild/ moderate associated hydroureteronephrosis down to this level.  She denies any fevers, chills, nausea, vomiting.  Creatinine 0.7. UA unremarkable. No leukocytosis.  She does have a history of recurrent stones.  She has undergone a ureteral reconstructive procedure at age 31 (left flank incision) with Dr. Ernst Spell.  She passes stones every several years.  Her last visit was at least 3 years ago.    Due to difficulty controlling her pain, she was admitted to the hospitalist service.   Past Medical History:  Diagnosis Date  . Arthritis   . GERD (gastroesophageal reflux disease)   . Headache   . Hypothyroidism   . PONV (postoperative nausea and vomiting)   . 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    Past Surgical History:  Procedure Laterality Date  . BREAST SURGERY    . CARPAL TUNNEL RELEASE Bilateral   . CHOLECYSTECTOMY    . DIRECT LARYNGOSCOPY Left 04/15/2015   Procedure: DIRECT LARYNGOSCOPY BIOPSY AND LASER LEFT TONGUE LESION;  Surgeon: Melissa Montane, MD;  Location: [place];  Service: ENT;  Laterality: Left;  . TONSILLECTOMY    . URETER SURGERY    . VAGINAL HYSTERECTOMY  2000    Home Medications:  Current Meds  Medication Sig  . levothyroxine (SYNTHROID, LEVOTHROID) 25 MCG tablet Take 25 mcg by mouth daily before breakfast.  . pantoprazole (PROTONIX) 40 MG tablet Take 40 mg by mouth 2 (two) times daily.  . ranitidine (ZANTAC) 150 MG tablet Take 150 mg by mouth at bedtime.  . sucralfate  (CARAFATE) 1 G tablet Take 1 g by mouth 4 (four) times daily -  with meals and at bedtime.    Allergies: No Known Allergies  Family History  Problem Relation Age of Onset  . Migraines Maternal Grandmother   . Stroke Maternal Grandmother     Social History:  reports that she has never smoked. She has never used smokeless tobacco. She reports that she does not drink alcohol or use drugs.  ROS: A complete review of systems was performed.  All systems are negative except for pertinent findings as noted.  Physical Exam:  Vital signs in last 24 hours: Temp:  [98 F (36.7 C)] 98 F (36.7 C) (03/19 0647) Pulse Rate:  [61-90] 87 (03/19 1211) Resp:  [18] 18 (03/19 1211) BP: (108-177)/(67-107) 149/85 (03/19 1211) SpO2:  [94 %-99 %] 97 % (03/19 1211) Weight:  [213 lb (96.6 kg)] 213 lb (96.6 kg) (03/19 0648) Constitutional:  Alert and oriented, lying on side, visible uncomfortable. HEENT: [place] AT, moist mucus membranes.  Trachea midline, no masses Cardiovascular: Regular rate and rhythm, no clubbing, cyanosis, or edema. Respiratory: Normal respiratory effort, lungs clear bilaterally GI: Abdomen is soft, nontender, nondistended, no abdominal masses GU: No CVA tenderness Skin: No rashes, bruises or suspicious lesions Neurologic: Grossly intact, no focal deficits, moving all 4 extremities Psychiatric: Normal mood and affect   Laboratory Data:   Recent Labs  10/08/16 0655  WBC 8.0  HGB 14.8  HCT 41.6    Recent Labs  10/08/16 0655  NA 138  K 3.7  CL 107  CO2 22  GLUCOSE 127*  BUN 7  CREATININE 0.70  CALCIUM 9.2   Component     Latest Ref Rng & Units 10/08/2016  pH     5.0 - 8.0 5.0  Protein     NEGATIVE mg/dL NEGATIVE  Nitrite     NEGATIVE NEGATIVE  Mucous      PRESENT  Color, Urine     YELLOW YELLOW (A)  Appearance     CLEAR CLEAR (A)  Specific Gravity, Urine     1.005 - 1.030 1.010  Glucose     NEGATIVE mg/dL NEGATIVE  Hgb urine dipstick     NEGATIVE LARGE  (A)  Bilirubin Urine     NEGATIVE NEGATIVE  Ketones, ur     NEGATIVE mg/dL NEGATIVE  Leukocytes, UA     NEGATIVE NEGATIVE  RBC / HPF     0 - 5 RBC/hpf TOO NUMEROUS TO COUNT  WBC, UA     0 - 5 WBC/hpf 0-5  Bacteria, UA     NONE SEEN NONE SEEN  Squamous Epithelial / LPF     NONE SEEN 0-5 (A)    Radiologic Imaging: Ct Renal Stone Study  Result Date: 10/08/2016 CLINICAL DATA:  51 year old female fell with acute right flank and mid back pain. Reports she passed a small kidney stone over the weekend. EXAM: CT ABDOMEN AND PELVIS WITHOUT CONTRAST TECHNIQUE: Multidetector CT imaging of the abdomen and pelvis was performed following the standard protocol without IV contrast. COMPARISON:  CT Abdomen 02/23/2015 and earlier. FINDINGS: Lower chest: Normal lung bases.  No pericardial or pleural effusion. Hepatobiliary: Surgically absent gallbladder as before. Geographic decreased density in the right lobe of the noncontrast liver compatible with steatosis. Pancreas: Negative. Spleen: Negative. Adrenals/Urinary Tract: Normal adrenal glands. The left kidney and left ureter are normal. Mild to moderate right hydronephrosis and moderate to severe right hydroureter. No perinephric stranding. No intrarenal calculus. The right ureter remains dilated into the pelvis, and about 10 mm proximal to the right ureterovesical junction there is a solitary 3 x 4 mm solitary versus two smaller adjacent obstructing calculi (series 2, image 85 and coronal image 79. Multiple superimposed bilateral calcified phleboliths. The urinary bladder is mostly decompressed and unremarkable. No abdominal free fluid. Stomach/Bowel: Negative rectosigmoid colon. Negative left colon. Negative transverse and right colon. The appendix is not identified and is suspected to be surgically absent. Negative terminal ileum. No dilated small bowel. Negative stomach and duodenum. Vascular/Lymphatic: Vascular patency is not evaluated in the absence of IV  contrast. No lymphadenopathy. Reproductive: Uterus is surgically absent. Adnexa are diminutive or absent. Other: No pelvic free fluid. Musculoskeletal: Chronic L1 superior endplate compression. Stable visualized osseous structures. IMPRESSION: 1. Acute obstructive uropathy on the right with 3 x 4 mm - solitary versus two small adjacent - obstructing stone(s) located about 10 mm from the right UVJ. 2. No other urologic calculus identified. 3. Hepatic steatosis suspected. Electronically Signed   By: Genevie Ann M.D.   On: 10/08/2016 07:26   CT scan personally reviewed today.  Impression/Assessment:  51 yo F with 4 mm R UVJ stone with poorly controlled pain, otherwise no evidence of infection.  Plan:  -Options including medical expulsive therapy vs. Ureteroscopy were discussed, risks and benefits of each were discussed -Desires conservative management at this time -recommend IVF @125 , strain urine, flomax, pain meds -CALL IMMEDIATELY if  spikes fever or clinic status changes -NPO at MN with reassessment early tomorrow AM  10/08/2016, 1:32 PM  Hollice Espy,  MD   Thank you for involving me in this patient's care.  I will continue to follow along.  Please page with any further questions or concerns.

## 2016-10-08 NOTE — ED Notes (Signed)
Discussed positive pregnancy with Dr. Corky Downs.  Patient states she had a full hysterectomy.  The second line on the pregnancy test was very faint.

## 2016-10-08 NOTE — H&P (Addendum)
[facility] [place] NAME: Tammy Young    MR#:  [id number]  DATE OF BIRTH:  1965-11-17  DATE OF ADMISSION:  10/08/2016  PRIMARY CARE PHYSICIAN: WHITE, Orlene Och, NP   REQUESTING/REFERRING PHYSICIAN: dr Corky Downs  CHIEF COMPLAINT:   Kidney pain HISTORY OF PRESENT ILLNESS:  Tammy Young  is a 51 y.o. female with a known history of Kidney stones who presents to the emergency room with intractable right flank pain. Patient reports she developed flank pain on Friday and thought she passed a stone on Saturday however symptoms worsened onset a says she presents today. She denies nausea, vomiting, fevers or chills. She denies dysuria. She has received multiple pain medications in the emergency room without much relief. Her current pain is 6 out of 10. CT scan shows Acute obstructive uropathy on the right with 3 x 4 mm - solitary versus two small adjacent - obstructing stone(s) located about 10 mm from the right UVJ.    PAST MEDICAL HISTORY:   Past Medical History:  Diagnosis Date  . Arthritis   . GERD (gastroesophageal reflux disease)   . Headache   . Hypothyroidism   . PONV (postoperative nausea and vomiting)   . 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  Essential hypertension  PAST SURGICAL HISTORY:   Past Surgical History:  Procedure Laterality Date  . BREAST SURGERY    . CARPAL TUNNEL RELEASE Bilateral   . CHOLECYSTECTOMY    . DIRECT LARYNGOSCOPY Left 04/15/2015   Procedure: DIRECT LARYNGOSCOPY BIOPSY AND LASER LEFT TONGUE LESION;  Surgeon: Melissa Montane, MD;  Location: [facility];  Service: ENT;  Laterality: Left;  . TONSILLECTOMY    . URETER SURGERY    . VAGINAL HYSTERECTOMY  2000    SOCIAL HISTORY:   Social History  Substance Use Topics  . Smoking status: Never Smoker  . Smokeless tobacco: Never Used  . Alcohol use No    FAMILY HISTORY:   Family  History  Problem Relation Age of Onset  . Migraines Maternal Grandmother   . Stroke Maternal Grandmother     DRUG ALLERGIES:  No Known Allergies  REVIEW OF SYSTEMS:   Review of Systems  Constitutional: Negative.  Negative for chills, fever and malaise/fatigue.  HENT: Negative.  Negative for ear discharge, ear pain, hearing loss, nosebleeds and sore throat.   Eyes: Negative.  Negative for blurred vision and pain.  Respiratory: Negative.  Negative for cough, hemoptysis, shortness of breath and wheezing.   Cardiovascular: Negative.  Negative for chest pain, palpitations and leg swelling.  Gastrointestinal: Negative.  Negative for abdominal pain, blood in stool, diarrhea, nausea and vomiting.  Genitourinary: Positive for flank pain. Negative for dysuria.  Musculoskeletal: Negative for back pain.  Skin: Negative.   Neurological: Negative for dizziness, tremors, speech change, focal weakness, seizures and headaches.  Endo/Heme/Allergies: Negative.  Does not bruise/bleed easily.  Psychiatric/Behavioral: Negative.  Negative for depression, hallucinations and suicidal ideas.    MEDICATIONS AT HOME:   Prior to Admission medications   Medication Sig Start Date End Date Taking? Authorizing Provider  levothyroxine (SYNTHROID, LEVOTHROID) 25 MCG tablet Take 25 mcg by mouth daily before breakfast.   Yes Historical Provider, MD  pantoprazole (PROTONIX) 40 MG tablet Take 40 mg by mouth 2 (two) times daily.   Yes Historical Provider, MD  ranitidine (ZANTAC) 150 MG tablet Take 150 mg by mouth at bedtime.  Yes Historical Provider, MD  sucralfate (CARAFATE) 1 G tablet Take 1 g by mouth 4 (four) times daily -  with meals and at bedtime.   Yes Historical Provider, MD  Cyanocobalamin (VITAMIN B 12 PO) Take 5,000 mg by mouth.    Historical Provider, MD  HYDROcodone-acetaminophen (NORCO/VICODIN) 5-325 MG tablet Take 1 tablet by mouth every 4 (four) hours as needed for moderate pain. 10/08/16   Lavonia Drafts, MD  naproxen (NAPROSYN) 500 MG tablet Take 1 tablet (500 mg total) by mouth 2 (two) times daily with a meal. 10/08/16   Lavonia Drafts, MD    Losartan 50 mg daily  VITAL SIGNS:  Blood pressure (!) 149/85, pulse 87, temperature 98 F (36.7 C), temperature source Oral, resp. rate 18, height 5\' 5"  (1.651 m), weight 96.6 kg (213 lb), SpO2 97 %.  PHYSICAL EXAMINATION:   Physical Exam  Constitutional: She is oriented to person, place, and time and well-developed, well-nourished, and in no distress. No distress.  HENT:  Head: Normocephalic.  Eyes: No scleral icterus.  Neck: Normal range of motion. Neck supple. No JVD present. No tracheal deviation present.  Cardiovascular: Normal rate, regular rhythm and normal heart sounds.  Exam reveals no gallop and no friction rub.   No murmur heard. Pulmonary/Chest: Effort normal and breath sounds normal. No respiratory distress. She has no wheezes. She has no rales. She exhibits no tenderness.  Abdominal: Soft. Bowel sounds are normal. She exhibits no distension and no mass. There is no tenderness. There is no rebound and no guarding.  Musculoskeletal: Normal range of motion. She exhibits no edema.  Right cvat  Neurological: She is alert and oriented to person, place, and time.  Skin: Skin is warm. No rash noted. No erythema.  Psychiatric: Affect and judgment normal.      LABORATORY PANEL:   CBC  Recent Labs Lab 10/08/16 0655  WBC 8.0  HGB 14.8  HCT 41.6  PLT 252   ------------------------------------------------------------------------------------------------------------------  Chemistries   Recent Labs Lab 10/08/16 0655  NA 138  K 3.7  CL 107  CO2 22  GLUCOSE 127*  BUN 7  CREATININE 0.70  CALCIUM 9.2  AST 28  ALT 30  ALKPHOS 77  BILITOT 0.6   ------------------------------------------------------------------------------------------------------------------  Cardiac Enzymes No results for input(s): TROPONINI in  the last 168 hours. ------------------------------------------------------------------------------------------------------------------  RADIOLOGY:  Ct Renal Stone Study  Result Date: 10/08/2016 CLINICAL DATA:  51 year old female fell with acute right flank and mid back pain. Reports she passed a small kidney stone over the weekend. EXAM: CT ABDOMEN AND PELVIS WITHOUT CONTRAST TECHNIQUE: Multidetector CT imaging of the abdomen and pelvis was performed following the standard protocol without IV contrast. COMPARISON:  CT Abdomen 02/23/2015 and earlier. FINDINGS: Lower chest: Normal lung bases.  No pericardial or pleural effusion. Hepatobiliary: Surgically absent gallbladder as before. Geographic decreased density in the right lobe of the noncontrast liver compatible with steatosis. Pancreas: Negative. Spleen: Negative. Adrenals/Urinary Tract: Normal adrenal glands. The left kidney and left ureter are normal. Mild to moderate right hydronephrosis and moderate to severe right hydroureter. No perinephric stranding. No intrarenal calculus. The right ureter remains dilated into the pelvis, and about 10 mm proximal to the right ureterovesical junction there is a solitary 3 x 4 mm solitary versus two smaller adjacent obstructing calculi (series 2, image 85 and coronal image 79. Multiple superimposed bilateral calcified phleboliths. The urinary bladder is mostly decompressed and unremarkable. No abdominal free fluid. Stomach/Bowel: Negative rectosigmoid colon. Negative left colon. Negative  transverse and right colon. The appendix is not identified and is suspected to be surgically absent. Negative terminal ileum. No dilated small bowel. Negative stomach and duodenum. Vascular/Lymphatic: Vascular patency is not evaluated in the absence of IV contrast. No lymphadenopathy. Reproductive: Uterus is surgically absent. Adnexa are diminutive or absent. Other: No pelvic free fluid. Musculoskeletal: Chronic L1 superior endplate  compression. Stable visualized osseous structures. IMPRESSION: 1. Acute obstructive uropathy on the right with 3 x 4 mm - solitary versus two small adjacent - obstructing stone(s) located about 10 mm from the right UVJ. 2. No other urologic calculus identified. 3. Hepatic steatosis suspected. Electronically Signed   By: Genevie Ann M.D.   On: 10/08/2016 07:26    EKG:    IMPRESSION AND PLAN:   51 year old female with a past medical history significant for kidney stones and hypothyroidism who presents with right flank pain.  1. Acute obstructive uropathy on the right with 3 x 4 mm - solitary versus two small adjacent - obstructing stone(s) located about 10 mm from the right UVJ: Urology consult Pain management IV fluids Strain urine  2. Hypothyroidism: Continue Levaquin 7  3. GERD: Continue PPI  4. Accelerated essential hypertension due to pain: Continue losartan and start when necessary hydralazine   All the records are reviewed and case discussed with ED provider as stated above. Management plans discussed with the patient and she is in agreement  CODE STATUS: full  TOTAL TIME TAKING CARE OF THIS PATIENT: 39 minutes.    Arseniy Toomey M.D on 10/08/2016 at 12:18 PM  Between 7am to 6pm - Pager - [phone]  After 6pm go to [email or web address] - Proofreader  [facility]  Office  [phone]  CC: Primary care physician; WHITE, Orlene Och, NP

## 2016-10-08 NOTE — ED Triage Notes (Signed)
Pt with history of kidney stones; says she passed a small one over the weekend; pt presents in tears, unable to sit still with right mid back pain; says she is unable to void;

## 2016-10-09 ENCOUNTER — Encounter: Payer: Self-pay | Admitting: *Deleted

## 2016-10-09 ENCOUNTER — Inpatient Hospital Stay: Payer: PRIVATE HEALTH INSURANCE

## 2016-10-09 ENCOUNTER — Encounter
Admission: EM | Disposition: A | Discharge status: Home / Self Care | Payer: Self-pay | Source: Home / Self Care | Attending: Internal Medicine

## 2016-10-09 ENCOUNTER — Inpatient Hospital Stay: Payer: PRIVATE HEALTH INSURANCE | Admitting: Anesthesiology

## 2016-10-09 DIAGNOSIS — N201 Calculus of ureter: Secondary | ICD-10-CM

## 2016-10-09 HISTORY — PX: CYSTOSCOPY WITH URETEROSCOPY, STONE BASKETRY AND STENT PLACEMENT: SHX6378

## 2016-10-09 LAB — BASIC METABOLIC PANEL
Anion gap: 6 (ref 5–15)
BUN: 14 mg/dL (ref 6–20)
CO2: 23 mmol/L (ref 22–32)
Calcium: 8.4 mg/dL — ABNORMAL LOW (ref 8.9–10.3)
Chloride: 109 mmol/L (ref 101–111)
Creatinine, Ser: 0.7 mg/dL (ref 0.44–1.00)
GFR calc Af Amer: >60 mL/min (ref >60)
GFR calc non Af Amer: >60 mL/min (ref >60)
Glucose, Bld: 107 mg/dL — ABNORMAL HIGH (ref 65–99)
Potassium: 3.8 mmol/L (ref 3.5–5.1)
Sodium: 138 mmol/L (ref 135–145)

## 2016-10-09 LAB — CBC
HCT: 36.6 % (ref 35.0–47.0)
Hemoglobin: 12.6 g/dL (ref 12.0–16.0)
MCH: 31.7 pg (ref 26.0–34.0)
MCHC: 34.4 g/dL (ref 32.0–36.0)
MCV: 92.1 fL (ref 80.0–100.0)
Platelets: 194 10*3/uL (ref 150–440)
RBC: 3.97 MIL/uL (ref 3.80–5.20)
RDW: 12.9 % (ref 11.5–14.5)
WBC: 6.6 10*3/uL (ref 3.6–11.0)

## 2016-10-09 LAB — HIV ANTIBODY (ROUTINE TESTING W REFLEX): HIV Screen 4th Generation wRfx: NONREACTIVE

## 2016-10-09 SURGERY — CYSTOSCOPY, WITH CALCULUS MANIPULATION OR REMOVAL
Anesthesia: General | Laterality: Right

## 2016-10-09 MED ORDER — MIDAZOLAM HCL 2 MG/2ML IJ SOLN
INTRAMUSCULAR | Status: AC
  Filled 2016-10-09: qty 2

## 2016-10-09 MED ORDER — ROCURONIUM BROMIDE 100 MG/10ML IV SOLN
INTRAVENOUS | Status: DC | PRN
  Administered 2016-10-09: 20 mg via INTRAVENOUS

## 2016-10-09 MED ORDER — ONDANSETRON HCL 4 MG/2ML IJ SOLN: 4.0000 mg | Freq: Once | INTRAMUSCULAR | Status: DC | PRN

## 2016-10-09 MED ORDER — SUCCINYLCHOLINE CHLORIDE 20 MG/ML IJ SOLN
INTRAMUSCULAR | Status: DC | PRN
  Administered 2016-10-09: 100 mg via INTRAVENOUS

## 2016-10-09 MED ORDER — ROCURONIUM BROMIDE 50 MG/5ML IV SOLN
INTRAVENOUS | Status: AC
  Filled 2016-10-09: qty 1

## 2016-10-09 MED ORDER — LIDOCAINE HCL (CARDIAC) 20 MG/ML IV SOLN
INTRAVENOUS | Status: DC | PRN
  Administered 2016-10-09: 80 mg via INTRAVENOUS

## 2016-10-09 MED ORDER — IOTHALAMATE MEGLUMINE 43 % IV SOLN
INTRAVENOUS | Status: DC | PRN
Start: 2016-10-09 — End: 2016-10-09
  Administered 2016-10-09: 5 mL

## 2016-10-09 MED ORDER — ONDANSETRON HCL 4 MG/2ML IJ SOLN
INTRAMUSCULAR | Status: AC
  Filled 2016-10-09: qty 2

## 2016-10-09 MED ORDER — MIDAZOLAM HCL 2 MG/2ML IJ SOLN
INTRAMUSCULAR | Status: DC | PRN
  Administered 2016-10-09: 2 mg via INTRAVENOUS

## 2016-10-09 MED ORDER — KETOROLAC TROMETHAMINE 30 MG/ML IJ SOLN
INTRAMUSCULAR | Status: AC
  Filled 2016-10-09: qty 1

## 2016-10-09 MED ORDER — DEXAMETHASONE SODIUM PHOSPHATE 10 MG/ML IJ SOLN
INTRAMUSCULAR | Status: DC | PRN
  Administered 2016-10-09: 10 mg via INTRAVENOUS

## 2016-10-09 MED ORDER — FENTANYL CITRATE (PF) 100 MCG/2ML IJ SOLN
INTRAMUSCULAR | Status: AC
Start: 2016-10-09 — End: 2016-10-09
  Filled 2016-10-09: qty 2

## 2016-10-09 MED ORDER — SUGAMMADEX SODIUM 500 MG/5ML IV SOLN
INTRAVENOUS | Status: AC
  Filled 2016-10-09: qty 5

## 2016-10-09 MED ORDER — PROPOFOL 10 MG/ML IV BOLUS
INTRAVENOUS | Status: AC
  Filled 2016-10-09: qty 20

## 2016-10-09 MED ORDER — FENTANYL CITRATE (PF) 100 MCG/2ML IJ SOLN
INTRAMUSCULAR | Status: DC | PRN
  Administered 2016-10-09 (×2): 50 ug via INTRAVENOUS

## 2016-10-09 MED ORDER — SUGAMMADEX SODIUM 200 MG/2ML IV SOLN
INTRAVENOUS | Status: DC | PRN
  Administered 2016-10-09: 225 mg via INTRAVENOUS

## 2016-10-09 MED ORDER — LIDOCAINE HCL (PF) 2 % IJ SOLN
INTRAMUSCULAR | Status: AC
  Filled 2016-10-09: qty 2

## 2016-10-09 MED ORDER — SUCCINYLCHOLINE CHLORIDE 20 MG/ML IJ SOLN
INTRAMUSCULAR | Status: AC
  Filled 2016-10-09: qty 1

## 2016-10-09 MED ORDER — CIPROFLOXACIN IN D5W 400 MG/200ML IV SOLN
400.0000 mg | Freq: Once | INTRAVENOUS | Status: AC
  Administered 2016-10-09: 400 mg via INTRAVENOUS
  Filled 2016-10-09: qty 200

## 2016-10-09 MED ORDER — FENTANYL CITRATE (PF) 100 MCG/2ML IJ SOLN
25.0000 ug | INTRAMUSCULAR | Status: DC | PRN
  Administered 2016-10-09 (×4): 25 ug via INTRAVENOUS

## 2016-10-09 MED ORDER — KETOROLAC TROMETHAMINE 30 MG/ML IJ SOLN
INTRAMUSCULAR | Status: DC | PRN
  Administered 2016-10-09: 30 mg via INTRAVENOUS

## 2016-10-09 MED ORDER — PROPOFOL 10 MG/ML IV BOLUS
INTRAVENOUS | Status: DC | PRN
  Administered 2016-10-09: 170 mg via INTRAVENOUS

## 2016-10-09 MED ORDER — FENTANYL CITRATE (PF) 100 MCG/2ML IJ SOLN
INTRAMUSCULAR | Status: AC
  Administered 2016-10-09: 25 ug via INTRAVENOUS
  Filled 2016-10-09: qty 2

## 2016-10-09 MED ORDER — DEXAMETHASONE SODIUM PHOSPHATE 10 MG/ML IJ SOLN
INTRAMUSCULAR | Status: AC
  Filled 2016-10-09: qty 1

## 2016-10-09 MED ORDER — ONDANSETRON HCL 4 MG/2ML IJ SOLN
INTRAMUSCULAR | Status: DC | PRN
  Administered 2016-10-09: 4 mg via INTRAVENOUS

## 2016-10-09 SURGICAL SUPPLY — 31 items
ADHESIVE MASTISOL STRL (MISCELLANEOUS) ×4 IMPLANT
BAG DRAIN CYSTO-URO LG1000N (MISCELLANEOUS) ×4 IMPLANT
BASKET ZERO TIP 1.9FR (BASKET) ×4 IMPLANT
BRUSH SCRUB EZ 1% IODOPHOR (MISCELLANEOUS) ×4 IMPLANT
CATH URETL 5X70 OPEN END (CATHETERS) ×4 IMPLANT
CNTNR SPEC 2.5X3XGRAD LEK (MISCELLANEOUS)
CONRAY 43 FOR UROLOGY 50M (MISCELLANEOUS) ×4 IMPLANT
CONT SPEC 4OZ STER OR WHT (MISCELLANEOUS)
CONTAINER SPEC 2.5X3XGRAD LEK (MISCELLANEOUS) IMPLANT
DRAPE UTILITY 15X26 TOWEL STRL (DRAPES) ×4 IMPLANT
DRSG TEGADERM 2-3/8X2-3/4 SM (GAUZE/BANDAGES/DRESSINGS) ×4 IMPLANT
FIBER LASER LITHO 273 (Laser) IMPLANT
GLOVE BIO SURGEON STRL SZ 6.5 (GLOVE) ×3 IMPLANT
GLOVE BIO SURGEONS STRL SZ 6.5 (GLOVE) ×1
GOWN STRL REUS W/ TWL LRG LVL3 (GOWN DISPOSABLE) ×4 IMPLANT
GOWN STRL REUS W/TWL LRG LVL3 (GOWN DISPOSABLE) ×4
GUIDEWIRE SUPER STIFF (WIRE) IMPLANT
INFUSOR MANOMETER BAG 3000ML (MISCELLANEOUS) ×4 IMPLANT
INTRODUCER DILATOR DOUBLE (INTRODUCER) IMPLANT
KIT RM TURNOVER CYSTO AR (KITS) ×4 IMPLANT
PACK CYSTO AR (MISCELLANEOUS) ×4 IMPLANT
SENSORWIRE 0.038 NOT ANGLED (WIRE) ×8
SET CYSTO W/LG BORE CLAMP LF (SET/KITS/TRAYS/PACK) ×4 IMPLANT
SHEATH URETERAL 12FRX35CM (MISCELLANEOUS) IMPLANT
SOL .9 NS 3000ML IRR  AL (IV SOLUTION) ×2
SOL .9 NS 3000ML IRR UROMATIC (IV SOLUTION) ×2 IMPLANT
STENT URET 6FRX24 CONTOUR (STENTS) ×4 IMPLANT
STENT URET 6FRX26 CONTOUR (STENTS) IMPLANT
SURGILUBE 2OZ TUBE FLIPTOP (MISCELLANEOUS) ×4 IMPLANT
WATER STERILE IRR 1000ML POUR (IV SOLUTION) ×4 IMPLANT
WIRE SENSOR 0.038 NOT ANGLED (WIRE) ×4 IMPLANT

## 2016-10-09 NOTE — Anesthesia Preprocedure Evaluation (Signed)
Anesthesia Evaluation  Patient identified by MRN, date of birth, ID band Patient awake    Reviewed: Allergy & Precautions, NPO status , Patient's Chart, lab work & pertinent test results  History of Anesthesia Complications (+) PONV  Airway Mallampati: III       Dental   Pulmonary neg pulmonary ROS,           Cardiovascular negative cardio ROS       Neuro/Psych negative neurological ROS     GI/Hepatic GERD  Medicated and Poorly Controlled,  Endo/Other  Hypothyroidism   Renal/GU Renal disease (stones)     Musculoskeletal   Abdominal   Peds  Hematology negative hematology ROS (+)   Anesthesia Other Findings   Reproductive/Obstetrics                             Anesthesia Physical Anesthesia Plan  ASA: II  Anesthesia Plan: General   Post-op Pain Management:    Induction: Intravenous  Airway Management Planned: Oral ETT  Additional Equipment:   Intra-op Plan:   Post-operative Plan:   Informed Consent: I have reviewed the patients History and Physical, chart, labs and discussed the procedure including the risks, benefits and alternatives for the proposed anesthesia with the patient or authorized representative who has indicated his/her understanding and acceptance.     Plan Discussed with:   Anesthesia Plan Comments:         Anesthesia Quick Evaluation

## 2016-10-09 NOTE — Clinical Social Work Note (Signed)
CSW consulted for pt having difficulty affording medications. CSW updated RNCM who will follow up. CSW is signing off as no further needs identified. Please reconsult if a need arises prior to discharge.   Darden Dates, MSW, LCSW  Clinical Social Worker  [phone]

## 2016-10-09 NOTE — Progress Notes (Signed)
Chaplain responded to an OR for a Pt in [id number], who requested for Prayer. When the [facility] arrived, the Nurse was in the Rm. [place] waited for the Nurse to complete her visit and then entered the Rm. CH had a talk with the Pt. Pt states she is anxious about a procedure due this afternoon. Pt also mentioned she is lonely because her mother cannot come to see her at the hospital, and her son is not responding to her phone calls. Pt states she [place] alter[place] beach [place]. Pt thinks that her son is angry with her and that is making her depressed. CH provided encouragement, prayer support and presence. [place] also informed Pt that [facility] services were available if needed. Pt does not have a good faith or family support system, and would benefit from a Chaplain's follow up visit.     10/09/16 1000  Clinical Encounter Type  Visited With Patient;Health care provider  Visit Type Initial  Referral From Nurse  Consult/Referral To Chaplain  Spiritual Encounters  Spiritual Needs Prayer;Other (Comment)

## 2016-10-09 NOTE — Plan of Care (Signed)
Patient states that she had a complete hysterectomy at 28 and is currently no sexually active. Not possible for her to be pregnant.

## 2016-10-09 NOTE — Anesthesia Procedure Notes (Signed)
Procedure Name: Intubation Date/Time: 10/09/2016 1:35 PM Performed by: Johnna Acosta Pre-anesthesia Checklist: Patient identified, Emergency Drugs available, Suction available, Patient being monitored and Timeout performed Patient Re-evaluated:Patient Re-evaluated prior to inductionOxygen Delivery Method: Circle system utilized Preoxygenation: Pre-oxygenation with 100% oxygen Intubation Type: IV induction Ventilation: Mask ventilation without difficulty Laryngoscope Size: Miller and 2 Grade View: Grade I Tube type: Oral Tube size: 7.0 mm Number of attempts: 1 Airway Equipment and Method: Stylet Placement Confirmation: ETT inserted through vocal cords under direct vision,  positive ETCO2 and breath sounds checked- equal and bilateral Secured at: 21 cm Tube secured with: Tape Dental Injury: Teeth and Oropharynx as per pre-operative assessment

## 2016-10-09 NOTE — Anesthesia Post-op Follow-up Note (Cosign Needed)
Anesthesia QCDR form completed.        

## 2016-10-09 NOTE — Op Note (Signed)
Date of procedure: 10/09/16  Preoperative diagnosis:  1. Right distal ureteral stone   Postoperative diagnosis:  1. Same as above   Procedure: 1. Right ureteroscopy 2. Basket extraction of Stone fragment 3. Right ureteral stent placement 4. Right retrograde pyelogram  Surgeon: Hollice Espy, MD  Anesthesia: General  Complications: None  Intraoperative findings: Small 4 mm right distal ureteral stone identified, able to be extracted in small pieces by basket.  EBL: Minimal  Specimens: Stone fragments  Drains: 6 x 24 French double-J ureteral stent with string in place on right  Indication: Tammy Young is a 51 y.o. patient with a 4 mm obstructing right distal ureteral stone admitted for poorly controlled pain .  After reviewing the management options for treatment, she elected to proceed with the above surgical procedure(s). We have discussed the potential benefits and risks of the procedure, side effects of the proposed treatment, the likelihood of the patient achieving the goals of the procedure, and any potential problems that might occur during the procedure or recuperation. Informed consent has been obtained.  Description of procedure:  The patient was taken to the operating room and general anesthesia was induced.  The patient was placed in the dorsal lithotomy position, prepped and draped in the usual sterile fashion, and preoperative antibiotics were administered. A preoperative time-out was performed.   A 21 French cystoscope was advanced per urethra into the bladder. Attention was turned to the right ureteral orifice which was cannulated using a 5 [place] open-ended ureteral catheter just within the UO. Gentle retrograde pyelogram was performed which revealed a decompressed distal ureter with a transition point approximately 1 cm within the ureter consistent with an obstructing stone.  A wire was then placed up to level of care kidney under fluoroscopic guidance. This was  snapped in place as a safety wire. A 4.5 French semirigid ureteroscope was then advanced in the distal ureter where a stone was encountered. With some manipulation, the stone broke up into a few smaller pieces. A 1.9 French to plus nitinol basket was then used to extract each of these pieces. Once the distal ureter was clear, the scope was advanced all the way up to level of the iliacs and no additional stone fragments were identified. The scope was then removed. A 624 French double-J ureteral stent was advanced over the wire up to level of the kidney. The wire was partially drawn until coil was noted within the renal pelvis. The wire was then fully withdrawn and a coil was opened within the bladder. The bladder was then drained. The string was left attached to the stent. This was affixed to the patient's left inner thigh using Mastisol and Tegaderm. The patient was then cleaned and dried, repositioned supine position, reversed anesthesia, taken the PACU in stable condition.  Plan: Patient may remove her own stent on Friday morning. She will follow up in our clinic in 4 weeks with a renal ultrasound prior. Please discharged home with pain medication and Flomax, Ditropan.  She may be discharged home when deemed appropriate by the medical service once her pain is adequately controlled.  Hollice Espy, M.D.

## 2016-10-09 NOTE — Progress Notes (Signed)
Patient ID: Tammy Young, female   DOB: 08-15-1965, 51 y.o.   MRN: [id number]  [facility] PROGRESS NOTE  Tammy Young QIH:[id number] DOB: 1965-12-07 DOA: 10/08/2016 PCP: WHITE, Orlene Och, NP  HPI/Subjective: Patient seen earlier this morning before procedure and she was in a lot of pain. She is very concerned because she lives by herself and takes a long time to drive to the hospital. Some nausea but no vomiting.  Objective: Vitals:   10/09/16 1423 10/09/16 1424  BP: 102/63 102/63  Pulse: 68 67  Resp: 14 13  Temp: 98 F (36.7 C) 98 F (36.7 C)    Filed Weights   10/08/16 0648 10/08/16 1527 10/09/16 1238  Weight: 96.6 kg (213 lb) 106.6 kg (235 lb) 106.6 kg (235 lb)    ROS: Review of Systems  Constitutional: Negative for chills and fever.  Eyes: Negative for blurred vision.  Respiratory: Negative for cough and shortness of breath.   Cardiovascular: Negative for chest pain.  Gastrointestinal: Positive for abdominal pain and nausea. Negative for constipation, diarrhea and vomiting.  Genitourinary: Negative for dysuria.  Musculoskeletal: Negative for joint pain.  Neurological: Negative for dizziness and headaches.   Exam: Physical Exam  Constitutional: She is oriented to person, place, and time.  HENT:  Nose: No mucosal edema.  Mouth/Throat: No oropharyngeal exudate or posterior oropharyngeal edema.  Eyes: Conjunctivae, EOM and lids are normal. Pupils are equal, round, and reactive to light.  Neck: No JVD present. Carotid bruit is not present. No edema present. No thyroid mass and no thyromegaly present.  Cardiovascular: S1 normal and S2 normal.  Exam reveals no gallop.   No murmur heard. Pulses:      Dorsalis pedis pulses are 2+ on the right side, and 2+ on the left side.  Respiratory: No respiratory distress. She has decreased breath sounds in the right lower field and the left lower field. She has no wheezes. She has no rhonchi. She has no rales.  GI:  Soft. Bowel sounds are normal. There is tenderness in the right lower quadrant.  Musculoskeletal:       Right ankle: She exhibits swelling.       Left ankle: She exhibits swelling.  Lymphadenopathy:    She has no cervical adenopathy.  Neurological: She is alert and oriented to person, place, and time. No cranial nerve deficit.  Skin: Skin is warm. No rash noted. Nails show no clubbing.  Psychiatric: She has a normal mood and affect.      Data Reviewed: Basic Metabolic Panel:  Recent Labs Lab 10/08/16 0655 10/08/16 1450 10/09/16 0328  NA 138  --  138  K 3.7  --  3.8  CL 107  --  109  CO2 22  --  23  GLUCOSE 127*  --  107*  BUN 7  --  14  CREATININE 0.70 0.69 0.70  CALCIUM 9.2  --  8.4*   Liver Function Tests:  Recent Labs Lab 10/08/16 0655  AST 28  ALT 30  ALKPHOS 77  BILITOT 0.6  PROT 8.1  ALBUMIN 4.4    Recent Labs Lab 10/08/16 0655  LIPASE 28   CBC:  Recent Labs Lab 10/08/16 0655 10/08/16 1450 10/09/16 0328  WBC 8.0 8.5 6.6  HGB 14.8 13.7 12.6  HCT 41.6 39.1 36.6  MCV 91.1 91.3 92.1  PLT 252 207 194    Studies: Ct Renal Stone Study  Result Date: 10/08/2016 CLINICAL DATA:  51 year old female fell with acute right flank  and mid back pain. Reports she passed a small kidney stone over the weekend. EXAM: CT ABDOMEN AND PELVIS WITHOUT CONTRAST TECHNIQUE: Multidetector CT imaging of the abdomen and pelvis was performed following the standard protocol without IV contrast. COMPARISON:  CT Abdomen 02/23/2015 and earlier. FINDINGS: Lower chest: Normal lung bases.  No pericardial or pleural effusion. Hepatobiliary: Surgically absent gallbladder as before. Geographic decreased density in the right lobe of the noncontrast liver compatible with steatosis. Pancreas: Negative. Spleen: Negative. Adrenals/Urinary Tract: Normal adrenal glands. The left kidney and left ureter are normal. Mild to moderate right hydronephrosis and moderate to severe right hydroureter. No  perinephric stranding. No intrarenal calculus. The right ureter remains dilated into the pelvis, and about 10 mm proximal to the right ureterovesical junction there is a solitary 3 x 4 mm solitary versus two smaller adjacent obstructing calculi (series 2, image 85 and coronal image 79. Multiple superimposed bilateral calcified phleboliths. The urinary bladder is mostly decompressed and unremarkable. No abdominal free fluid. Stomach/Bowel: Negative rectosigmoid colon. Negative left colon. Negative transverse and right colon. The appendix is not identified and is suspected to be surgically absent. Negative terminal ileum. No dilated small bowel. Negative stomach and duodenum. Vascular/Lymphatic: Vascular patency is not evaluated in the absence of IV contrast. No lymphadenopathy. Reproductive: Uterus is surgically absent. Adnexa are diminutive or absent. Other: No pelvic free fluid. Musculoskeletal: Chronic L1 superior endplate compression. Stable visualized osseous structures. IMPRESSION: 1. Acute obstructive uropathy on the right with 3 x 4 mm - solitary versus two small adjacent - obstructing stone(s) located about 10 mm from the right UVJ. 2. No other urologic calculus identified. 3. Hepatic steatosis suspected. Electronically Signed   By: Genevie Ann M.D.   On: 10/08/2016 07:26    Scheduled Meds: . [MAR Hold] enoxaparin (LOVENOX) injection  40 mg Subcutaneous Q24H  . [MAR Hold] levothyroxine  25 mcg Oral QAC breakfast  . [MAR Hold] pantoprazole  40 mg Oral BID  . [MAR Hold] sucralfate  1 g Oral TID WC & HS   Continuous Infusions: . sodium chloride 125 mL/hr at 10/09/16 0000    Assessment/Plan:  1. Obstructive uropathy on the right. Patient taken today for urological procedure by urology. Flomax and Ditropan recommended by urology. Pain control. Gentle fluids. Strain stone. Patient will end up taking out her own stent on Friday. Follow-up with urology as outpatient. Likely discharge tomorrow  morning. 2. Hypothyroidism unspecified on levothyroxine 3. GERD on Protonix and Carafate  Code Status:     Code Status Orders        Start     Ordered   10/08/16 1421  Full code  Continuous     10/08/16 1420    Code Status History    Date Active Date Inactive Code Status Order ID Comments User Context   This patient has a current code status but no historical code status.     Disposition Plan: Likely home tomorrow morning  Consultants:  Urology  Procedures:  Please see operative report by urology  Time spent: 26 minutes  Loletha Grayer  [employer]

## 2016-10-09 NOTE — Transfer of Care (Signed)
Immediate Anesthesia Transfer of Care Note  Patient: Tammy Young  Procedure(s) Performed: Procedure(s): CYSTOSCOPY WITH URETEROSCOPY, STONE BASKETRY AND STENT PLACEMENT  Patient Location: PACU  Anesthesia Type:General  Level of Consciousness: sedated  Airway & Oxygen Therapy: Patient Spontanous Breathing and Patient connected to face mask oxygen  Post-op Assessment: Report given to RN and Post -op Vital signs reviewed and stable  Post vital signs: Reviewed and stable  Last Vitals:  Vitals:   10/09/16 1239 10/09/16 1423  BP: (!) 155/75 102/63  Pulse:  68  Resp:  14  Temp:  36.7 C    Last Pain:  Vitals:   10/09/16 1423  TempSrc: Temporal  PainSc:          Complications: No apparent anesthesia complications

## 2016-10-09 NOTE — Progress Notes (Signed)
Urology Consult Follow Up  Subjective: Continues to have severe pain.  Desiring intervention this AM.  Currently NPO.  Anti-infectives: Anti-infectives    Start     Dose/Rate Route Frequency Ordered Stop   10/09/16 0800  ciprofloxacin (CIPRO) IVPB 400 mg     400 mg 200 mL/hr over 60 Minutes Intravenous  Once 10/09/16 0755        Current Facility-Administered Medications  Medication Dose Route Frequency Provider Last Rate Last Dose  . 0.9 %  sodium chloride infusion   Intravenous Continuous Bettey Costa, MD 125 mL/hr at 10/09/16 0000    . acetaminophen (TYLENOL) tablet 650 mg  650 mg Oral Q6H PRN Bettey Costa, MD       Or  . acetaminophen (TYLENOL) suppository 650 mg  650 mg Rectal Q6H PRN Bettey Costa, MD      . bisacodyl (DULCOLAX) EC tablet 5 mg  5 mg Oral Daily PRN Bettey Costa, MD      . ciprofloxacin (CIPRO) IVPB 400 mg  400 mg Intravenous Once Hollice Espy, MD      . enoxaparin (LOVENOX) injection 40 mg  40 mg Subcutaneous Q24H Sital Mody, MD      . HYDROcodone-acetaminophen (NORCO/VICODIN) 5-325 MG per tablet 1-2 tablet  1-2 tablet Oral Q4H PRN Bettey Costa, MD   2 tablet at 10/08/16 1811  . ketorolac (TORADOL) 30 MG/ML injection 30 mg  30 mg Intravenous Q6H PRN Bettey Costa, MD   30 mg at 10/09/16 0307  . levothyroxine (SYNTHROID, LEVOTHROID) tablet 25 mcg  25 mcg Oral QAC breakfast Bettey Costa, MD   25 mcg at 10/09/16 0746  . morphine 2 MG/ML injection 2 mg  2 mg Intravenous Q4H PRN Bettey Costa, MD   2 mg at 10/09/16 0234  . pantoprazole (PROTONIX) EC tablet 40 mg  40 mg Oral BID Bettey Costa, MD   40 mg at 10/09/16 0746  . senna-docusate (Senokot-S) tablet 1 tablet  1 tablet Oral QHS PRN Bettey Costa, MD      . sucralfate (CARAFATE) tablet 1 g  1 g Oral TID WC & HS Bettey Costa, MD   1 g at 10/09/16 0746   Facility-Administered Medications Ordered in Other Encounters  Medication Dose Route Frequency Provider Last Rate Last Dose  . gadopentetate dimeglumine (MAGNEVIST) injection 20 mL  20 mL  Intravenous Once PRN Garvin Fila, MD         Objective: Vital signs in last 24 hours: Temp:  [97.7 F (36.5 C)-98.1 F (36.7 C)] 98 F (36.7 C) (03/20 0351) Pulse Rate:  [60-90] 66 (03/20 0351) Resp:  [18-20] 18 (03/20 0351) BP: (108-159)/(60-93) 117/70 (03/20 0351) SpO2:  [94 %-99 %] 98 % (03/20 0351) Weight:  [235 lb (106.6 kg)] 235 lb (106.6 kg) (03/19 1527)  Intake/Output from previous day: 03/19 0701 - 03/20 0700 In: 2829.6 [P.O.:240; I.V.:1589.6; IV Piggyback:1000] Out: 1150 [Urine:1150] Intake/Output this shift: No intake/output data recorded.   Physical Exam  Constitutional: She is oriented to person, place, and time. She appears distressed.  uncomfortable  HENT:  Head: Normocephalic and atraumatic.  Cardiovascular: Normal rate.   Pulmonary/Chest: Effort normal. No respiratory distress.  Abdominal: Soft. She exhibits no distension.  Neurological: She is oriented to person, place, and time.  Skin: Skin is warm and dry.  Psychiatric: Affect normal.    Lab Results:   Recent Labs  10/08/16 1450 10/09/16 0328  WBC 8.5 6.6  HGB 13.7 12.6  HCT 39.1 36.6  PLT 207 194  BMET  Recent Labs  10/08/16 0655 10/08/16 1450 10/09/16 0328  NA 138  --  138  K 3.7  --  3.8  CL 107  --  109  CO2 22  --  23  GLUCOSE 127*  --  107*  BUN 7  --  14  CREATININE 0.70 0.69 0.70  CALCIUM 9.2  --  8.4*   PT/INR No results for input(s): LABPROT, INR in the last 72 hours. ABG No results for input(s): PHART, HCO3 in the last 72 hours.  Invalid input(s): PCO2, PO2  Studies/Results: Ct Renal Stone Study  Result Date: 10/08/2016 CLINICAL DATA:  51 year old female fell with acute right flank and mid back pain. Reports she passed a small kidney stone over the weekend. EXAM: CT ABDOMEN AND PELVIS WITHOUT CONTRAST TECHNIQUE: Multidetector CT imaging of the abdomen and pelvis was performed following the standard protocol without IV contrast. COMPARISON:  CT Abdomen  02/23/2015 and earlier. FINDINGS: Lower chest: Normal lung bases.  No pericardial or pleural effusion. Hepatobiliary: Surgically absent gallbladder as before. Geographic decreased density in the right lobe of the noncontrast liver compatible with steatosis. Pancreas: Negative. Spleen: Negative. Adrenals/Urinary Tract: Normal adrenal glands. The left kidney and left ureter are normal. Mild to moderate right hydronephrosis and moderate to severe right hydroureter. No perinephric stranding. No intrarenal calculus. The right ureter remains dilated into the pelvis, and about 10 mm proximal to the right ureterovesical junction there is a solitary 3 x 4 mm solitary versus two smaller adjacent obstructing calculi (series 2, image 85 and coronal image 79. Multiple superimposed bilateral calcified phleboliths. The urinary bladder is mostly decompressed and unremarkable. No abdominal free fluid. Stomach/Bowel: Negative rectosigmoid colon. Negative left colon. Negative transverse and right colon. The appendix is not identified and is suspected to be surgically absent. Negative terminal ileum. No dilated small bowel. Negative stomach and duodenum. Vascular/Lymphatic: Vascular patency is not evaluated in the absence of IV contrast. No lymphadenopathy. Reproductive: Uterus is surgically absent. Adnexa are diminutive or absent. Other: No pelvic free fluid. Musculoskeletal: Chronic L1 superior endplate compression. Stable visualized osseous structures. IMPRESSION: 1. Acute obstructive uropathy on the right with 3 x 4 mm - solitary versus two small adjacent - obstructing stone(s) located about 10 mm from the right UVJ. 2. No other urologic calculus identified. 3. Hepatic steatosis suspected. Electronically Signed   By: Genevie Ann M.D.   On: 10/08/2016 07:26     Assessment: Will arrange for RIGHT  URS today given poorly controlled pain  Risks and benefits of ureteroscopy were reviewed including but not limited to infection,  bleeding, pain, ureteral injury which could require open surgery versus prolonged indwelling if ureteral perforation occurs, persistent stone disease, requirement for staged procedure, possible stent, and global anesthesia risks. Patient expressed understanding and desires to proceed with ureteroscopy.  Plan: -NPO -Consent -to OR around noon today -abx on call to OR     LOS: 1 day    Hollice Espy 10/09/2016

## 2016-10-10 ENCOUNTER — Telehealth: Payer: Self-pay

## 2016-10-10 ENCOUNTER — Encounter: Payer: Self-pay | Admitting: Urology

## 2016-10-10 MED ORDER — TAMSULOSIN HCL 0.4 MG PO CAPS
0.4000 mg | ORAL_CAPSULE | Freq: Every day | ORAL | Status: DC
  Administered 2016-10-10: 0.4 mg via ORAL
  Filled 2016-10-10: qty 1

## 2016-10-10 MED ORDER — OXYBUTYNIN CHLORIDE 5 MG PO TABS: 5.0000 mg | ORAL_TABLET | Freq: Two times a day (BID) | ORAL | 0 refills | Status: DC

## 2016-10-10 MED ORDER — HYDROCODONE-ACETAMINOPHEN 5-325 MG PO TABS: 1.0000 | ORAL_TABLET | Freq: Four times a day (QID) | ORAL | 0 refills | Status: DC | PRN

## 2016-10-10 MED ORDER — OXYBUTYNIN CHLORIDE 5 MG PO TABS
5.0000 mg | ORAL_TABLET | Freq: Two times a day (BID) | ORAL | Status: DC
  Administered 2016-10-10: 10:00:00 5 mg via ORAL
  Filled 2016-10-10 (×2): qty 1

## 2016-10-10 MED ORDER — TAMSULOSIN HCL 0.4 MG PO CAPS: 0.4000 mg | ORAL_CAPSULE | Freq: Every day | ORAL | 0 refills | Status: DC

## 2016-10-10 NOTE — Telephone Encounter (Signed)
Pt called stating she had a stent placed yesterday by Dr. Erlene Quan and is starting to feel discomfort with stent in place. Reinforced with pt that stent pain is normal, drink more fluids, can apply a heating pad to the area, and tylenol/ibuprofen alternating q4h. Reinforced with pt not to pull stent until Friday. Pt voiced understanding of whole conversation.

## 2016-10-10 NOTE — Progress Notes (Signed)
Patient ID: Tammy Young, female   DOB: 1965-08-22, 51 y.o.   MRN: [id number] [facility] was admitted to the Hospital on 10/08/2016 and Discharged  10/10/2016 and should be excused from work/school   for 4 days starting 10/08/2016 , may return to work/school without any restrictions.  Loletha Grayer M.D on 10/10/2016,at 10:45 AM  [facility] [place]

## 2016-10-10 NOTE — Care Management (Signed)
Admitted to this facility with the diagnosis of obstructive uropathy. Lives alone. Mother is Pricilla Holm [phone]). Last seen Eulogio Bear NP 2-3 months ago. No home health, skilled nursing or oxygen in the home. Takes care of all basic activities of daily living herself, drives. No falls. Good appetite. Prescriptions are filled at [facility] or [facility] on [address]. Works for [employer]. States she has a hard time paying for her medications. Works part-time now.  Information given about [facility].  Discharge to home today per Dr. Leslye Peer. Shelbie Ammons RN MSN CCM Care Management

## 2016-10-10 NOTE — Discharge Summary (Signed)
[facility] [employer] NAME: Tammy Young    MR#:  [id number]  DATE OF BIRTH:  1966/04/02  DATE OF ADMISSION:  10/08/2016 ADMITTING PHYSICIAN: Bettey Costa, MD  DATE OF DISCHARGE: 10/10/2016  1:38 PM  PRIMARY CARE PHYSICIAN: WHITE, Orlene Och, NP    ADMISSION DIAGNOSIS:  Ureterolithiasis [N20.1] Acute right flank pain [R10.9] Intractable pain [R52]  DISCHARGE DIAGNOSIS:  Active Problems:   Obstructive uropathy   Ureterolithiasis   Acute right flank pain   Intractable pain   SECONDARY DIAGNOSIS:   Past Medical History:  Diagnosis Date  . Arthritis   . GERD (gastroesophageal reflux disease)   . Headache   . Hypertension   . Hypothyroidism   . PONV (postoperative nausea and vomiting)   . 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    HOSPITAL COURSE:   1. Obstructive nephrolithiasis on the right. The patient was taken to the operating room by Dr. Erlene Quan on 10/09/2016 and stent placement and lithotripsy was done. Patient was feeling better in the morning of 10/10/2016 and was discharged home. The patient has a string attached to her stent that she can pull out on Friday. Patient was given a small amount of pain medication, Flomax and ditropan. Follow up with urology as outpatient. The patient did have amber: Urine in the morning which is normal after a procedure like this. Patient was advised that she must increase her urine output to over 2 L per day of urine output. 2. Hypothyroidism unspecified on levothyroxine 3. GERD on Protonix and Carafate   DISCHARGE CONDITIONS:   Satisfactory  CONSULTS OBTAINED:  Treatment Team:  Hollice Espy, MD  DRUG ALLERGIES:  No Known Allergies  DISCHARGE MEDICATIONS:   Discharge Medication List as of 10/10/2016 10:25 AM    START taking these medications   Details  oxybutynin (DITROPAN) 5 MG tablet Take 1 tablet (5 mg total) by mouth 2  (two) times daily., Starting Wed 10/10/2016, Print    tamsulosin (FLOMAX) 0.4 MG CAPS capsule Take 1 capsule (0.4 mg total) by mouth daily after breakfast., Starting Wed 10/10/2016, Print      CONTINUE these medications which have CHANGED   Details  HYDROcodone-acetaminophen (NORCO/VICODIN) 5-325 MG tablet Take 1 tablet by mouth every 6 (six) hours as needed for moderate pain., Starting Wed 10/10/2016, Print      CONTINUE these medications which have NOT CHANGED   Details  levothyroxine (SYNTHROID, LEVOTHROID) 25 MCG tablet Take 25 mcg by mouth daily before breakfast., Historical Med    pantoprazole (PROTONIX) 40 MG tablet Take 40 mg by mouth 2 (two) times daily., Historical Med    ranitidine (ZANTAC) 150 MG tablet Take 150 mg by mouth at bedtime., Historical Med    sucralfate (CARAFATE) 1 G tablet Take 1 g by mouth 4 (four) times daily -  with meals and at bedtime., Historical Med    Cyanocobalamin (VITAMIN B 12 PO) Take 5,000 mg by mouth., Historical Med      STOP taking these medications     traMADol (ULTRAM) 50 MG tablet      venlafaxine XR (EFFEXOR XR) 75 MG 24 hr capsule          DISCHARGE INSTRUCTIONS:   Follow-up urology 1 week, pull out stent on Friday follow-up with PMD in 2 weeks  If you experience worsening of your admission symptoms, develop shortness of breath, life threatening emergency, suicidal or homicidal  thoughts you must seek medical attention immediately by calling 911 or calling your MD immediately  if symptoms less severe.  You Must read complete instructions/literature along with all the possible adverse reactions/side effects for all the Medicines you take and that have been prescribed to you. Take any new Medicines after you have completely understood and accept all the possible adverse reactions/side effects.   Please note  You were cared for by a hospitalist during your hospital stay. If you have any questions about your discharge medications or  the care you received while you were in the hospital after you are discharged, you can call the unit and asked to speak with the hospitalist on call if the hospitalist that took care of you is not available. Once you are discharged, your primary care physician will handle any further medical issues. Please note that NO REFILLS for any discharge medications will be authorized once you are discharged, as it is imperative that you return to your primary care physician (or establish a relationship with a primary care physician if you do not have one) for your aftercare needs so that they can reassess your need for medications and monitor your lab values.    Today   CHIEF COMPLAINT:   Chief Complaint  Patient presents with  . Back Pain    HISTORY OF PRESENT ILLNESS:  Tammy Young  is a 51 y.o. female with a known history of Kidney stones presented with back pain and flank pain   VITAL SIGNS:  Blood pressure 135/73, pulse 85, temperature 98.2 F (36.8 C), temperature source Oral, resp. rate 20, height 5\' 5"  (1.651 m), weight 106.6 kg (235 lb), SpO2 97 %.    PHYSICAL EXAMINATION:  GENERAL:  52 y.o.-year-old patient lying in the bed with no acute distress.  EYES: Pupils equal, round, reactive to light and accommodation. No scleral icterus. Extraocular muscles intact.  HEENT: Head atraumatic, normocephalic. Oropharynx and nasopharynx clear.  NECK:  Supple, no jugular venous distention. No thyroid enlargement, no tenderness.  LUNGS: Normal breath sounds bilaterally, no wheezing, rales,rhonchi or crepitation. No use of accessory muscles of respiration.  CARDIOVASCULAR: S1, S2 normal. No murmurs, rubs, or gallops.  ABDOMEN: Soft, non-tender, non-distended. Bowel sounds present. No organomegaly or mass.  EXTREMITIES: No pedal edema, cyanosis, or clubbing.  NEUROLOGIC: Cranial nerves II through XII are intact. Muscle strength 5/5 in all extremities. Sensation intact. Gait not checked.   PSYCHIATRIC: The patient is alert and oriented x 3.  SKIN: No obvious rash, lesion, or ulcer.   DATA REVIEW:   CBC  Recent Labs Lab 10/09/16 0328  WBC 6.6  HGB 12.6  HCT 36.6  PLT 194    Chemistries   Recent Labs Lab 10/08/16 0655  10/09/16 0328  NA 138  --  138  K 3.7  --  3.8  CL 107  --  109  CO2 22  --  23  GLUCOSE 127*  --  107*  BUN 7  --  14  CREATININE 0.70  < > 0.70  CALCIUM 9.2  --  8.4*  AST 28  --   --   ALT 30  --   --   ALKPHOS 77  --   --   BILITOT 0.6  --   --   < > = values in this interval not displayed.    RADIOLOGY:  Dg Abd 1 View  Result Date: 10/09/2016 CLINICAL DATA:  51 year old female with history of cystoscopy in ureteroscopy status post  stent placement. EXAM: ABDOMEN - 1 VIEW; DG C-ARM 61-120 MIN COMPARISON:  No priors. FINDINGS: Two intraoperative fluoroscopic spot views of the abdomen and pelvis demonstrate a right double-J ureteral stent, the proximal loop of which appears reformed in the expected location of the right renal pelvis, and the distal loop appears reformed likely in the right side of the urinary bladder. IMPRESSION: 1. Intraoperative documentation of right-sided ureteroscopy and stent placement, as above. Electronically Signed   By: Vinnie Langton M.D.   On: 10/09/2016 15:25   Dg C-arm 1-60 Min  Result Date: 10/09/2016 CLINICAL DATA:  51 year old female with history of cystoscopy in ureteroscopy status post stent placement. EXAM: ABDOMEN - 1 VIEW; DG C-ARM 61-120 MIN COMPARISON:  No priors. FINDINGS: Two intraoperative fluoroscopic spot views of the abdomen and pelvis demonstrate a right double-J ureteral stent, the proximal loop of which appears reformed in the expected location of the right renal pelvis, and the distal loop appears reformed likely in the right side of the urinary bladder. IMPRESSION: 1. Intraoperative documentation of right-sided ureteroscopy and stent placement, as above. Electronically Signed   By: Vinnie Langton M.D.   On: 10/09/2016 15:25    Management plans discussed with the patient, and she is in agreement.  CODE STATUS:  Code Status History    Date Active Date Inactive Code Status Order ID Comments User Context   10/08/2016  2:20 PM 10/10/2016  4:43 PM Full Code [id number]  Bettey Costa, MD ED      TOTAL TIME TAKING CARE OF THIS PATIENT: 35 minutes.    Loletha Grayer M.D on 10/10/2016 at 5:33 PM  Between 7am to 6pm - Pager - [phone]  After 6pm go to [email or web address] - Proofreader  [facility] Office  [phone]  CC: Primary care physician; WHITE, Orlene Och, NP

## 2016-10-10 NOTE — Discharge Instructions (Signed)
You have a ureteral stent in place.  This is a tube that extends from your kidney to your bladder.  This may cause urinary bleeding, burning with urination, and urinary frequency.  Please call our office or present to the ED if you develop fevers >101 or pain which is not able to be controlled with oral pain medications.  You may be given either Flomax and/ or ditropan to help with bladder spasms and stent pain in addition to pain medications.    Your stent is on a string. This is taped to your left inner thigh. On Friday morning, untape the sting and pulled gently until the entire stent is removed. If you have any difficulty doing this or concerns, please call our office.  [facility] [address] [place], [place] [address] [phone]

## 2016-10-10 NOTE — Anesthesia Postprocedure Evaluation (Signed)
Anesthesia Post Note  Patient: Tammy Young  Procedure(s) Performed: Procedure(s): CYSTOSCOPY WITH URETEROSCOPY, [place]  Patient location during evaluation: PACU Anesthesia Type: General Level of consciousness: awake and alert and oriented Pain management: pain level controlled Vital Signs Assessment: post-procedure vital signs reviewed and stable Respiratory status: spontaneous breathing, nonlabored ventilation and respiratory function stable Cardiovascular status: blood pressure returned to baseline and stable Postop Assessment: no signs of nausea or vomiting Anesthetic complications: no     Last Vitals:  Vitals:   10/09/16 1938 10/10/16 0417  BP: 135/78 113/62  Pulse: 92 70  Resp: 20 18  Temp: 36.9 C 36.4 C    Last Pain:  Vitals:   10/10/16 0417  TempSrc: Oral  PainSc:                  [name]

## 2016-10-12 ENCOUNTER — Telehealth: Payer: Self-pay

## 2016-10-12 NOTE — Telephone Encounter (Signed)
Pt called back c/o pain after removing stent. Reinforced with pt to take tylenol/ibuprofen, apply heat to the area, take flomax, and relax. Pt requested stronger pain medication. Reinforced with pt pain medication typically is not given for this as it doesn't help as well as the other suggestions. Pt voiced understanding of whole conversation.

## 2016-10-19 LAB — STONE ANALYSIS
Ca Oxalate,Dihydrate: 45 %
Ca Oxalate,Monohydr.: 45 %
Ca phos cry stone ql IR: 10 %
Stone Weight KSTONE: 1.2 mg

## 2016-11-01 ENCOUNTER — Ambulatory Visit
Admission: RE | Admit: 2016-11-01 | Discharge: 2016-11-01 | Disposition: A | Discharge status: Home / Self Care | Payer: PRIVATE HEALTH INSURANCE | Source: Ambulatory Visit | Attending: Urology | Admitting: Urology

## 2016-11-01 DIAGNOSIS — N201 Calculus of ureter: Secondary | ICD-10-CM | POA: Diagnosis not present

## 2016-11-06 ENCOUNTER — Ambulatory Visit: Payer: Self-pay | Admitting: Urology

## 2016-11-07 NOTE — Progress Notes (Signed)
11/08/2016 10:18 AM   Tammy Young 1965/08/26 [id number]  Referring provider: Ricardo Jericho, NP [address] [place], [place] [address]  Chief Complaint  Patient presents with  . New Patient (Initial Visit)    ureteral stone 4 week follow up from cystoscopy with ureteroscopy at hospital    HPI: Patient is a 51 year old Caucasian female who is status post right URS for a distal ureteral stone presents today for follow up and to discuss RUS results.  Patient was admitted for a 4 mm distal right ureteral stone with obstruction that resulted in poorly controlled pain.  She underwent right ureteroscopically with stone basketing and right ureteral stent placement for this stone on 10/09/2016 with Dr. Erlene Quan.  The ureteral stent was left on a tether and patient removed the stents 3 days later.  Her postoperative course was as expected and uneventful.  Renal ultrasound completed on 11/01/2016 noted minimal to mild right side hydronephrosis prior to voiding which resolved after voiding.  Otherwise normal sonographic appearance of both kidneys and the urinary bladder.  I have independently reviewed the films.  Her stone composition was 45% calcium oxalate dihydrate, 45% calcium oxalate monohydrate and 10% calcium phosphate.  Today, she is experiencing incontinence, intermittency, hesitancy, straining to urinate and a weak urinary stream.  She denies dysuria, gross hematuria, suprapubic pain and flank pain. She also denies fevers, chills, nausea or vomiting.  Her UA was unremarkable.   She states these symptoms stated after her stent.    PMH: Past Medical History:  Diagnosis Date  . Anxiety and depression   . Arthritis   . GERD (gastroesophageal reflux disease)   . Headache   . History of stomach ulcers   . HLD (hyperlipidemia)   . Hypertension   . Hypothyroidism   . Liver disease   . PONV (postoperative nausea and vomiting)   . Renal disorder    kidney stones and  shortened ureter repaired with surgery as a child.  . Seizures [facility])     Surgical History: Past Surgical History:  Procedure Laterality Date  . BREAST SURGERY    . CARPAL TUNNEL RELEASE Bilateral   . CHOLECYSTECTOMY    . CYSTOSCOPY WITH URETEROSCOPY, STONE BASKETRY AND STENT PLACEMENT  10/09/2016   Procedure: CYSTOSCOPY WITH URETEROSCOPY, STONE BASKETRY AND STENT PLACEMENT;  Surgeon: Hollice Espy, MD;  Location: [facility];  Service: Urology;;  . DIRECT LARYNGOSCOPY Left 04/15/2015   Procedure: DIRECT LARYNGOSCOPY BIOPSY AND LASER LEFT TONGUE LESION;  Surgeon: Melissa Montane, MD;  Location: [place];  Service: ENT;  Laterality: Left;  . TONSILLECTOMY    . URETER SURGERY    . VAGINAL HYSTERECTOMY  2000    Home Medications:  Allergies as of 11/08/2016   No Known Allergies     Medication List       Accurate as of 11/08/16 10:18 AM. Always use your most recent med list.          CARAFATE 1 g tablet Generic drug:  sucralfate Take 1 g by mouth 4 (four) times daily -  with meals and at bedtime.   clonazePAM 0.5 MG tablet Commonly known as:  KLONOPIN Take by mouth.   escitalopram 10 MG tablet Commonly known as:  LEXAPRO Take by mouth.   HYDROcodone-acetaminophen 5-325 MG tablet Commonly known as:  NORCO/VICODIN Take 1 tablet by mouth every 6 (six) hours as needed for moderate pain.   levothyroxine 25 MCG tablet Commonly known as:  SYNTHROID, LEVOTHROID  Take 25 mcg by mouth daily before breakfast.   losartan 25 MG tablet Commonly known as:  COZAAR Take by mouth.   oxybutynin 5 MG tablet Commonly known as:  DITROPAN Take 1 tablet (5 mg total) by mouth 2 (two) times daily.   pantoprazole 40 MG tablet Commonly known as:  PROTONIX Take 40 mg by mouth 2 (two) times daily.   ranitidine 150 MG tablet Commonly known as:  ZANTAC Take 150 mg by mouth at bedtime.   tamsulosin 0.4 MG Caps capsule Commonly known as:  FLOMAX Take 1 capsule (0.4 mg total) by  mouth daily after breakfast.   valACYclovir 500 MG tablet Commonly known as:  VALTREX TAKE ONE CAPLET BY MOUTH TWICE DAILY   VITAMIN B 12 PO Take 5,000 mg by mouth.   cyanocobalamin 1000 MCG/ML injection Commonly known as:  (VITAMIN B-12) Inject into the muscle.       Allergies: No Known Allergies  Family History: Family History  Problem Relation Age of Onset  . Kidney Stones Mother   . Diabetes Mother   . Kidney Stones Father   . Migraines Maternal Grandmother   . Stroke Maternal Grandmother   . Diabetes Maternal Grandmother   . Prostate cancer Maternal Grandfather   . Kidney cancer Neg Hx   . Bladder Cancer Neg Hx     Social History:  reports that she has never smoked. She has never used smokeless tobacco. She reports that she does not drink alcohol or use drugs.  ROS: UROLOGY Frequent Urination?: No Hard to postpone urination?: No Burning/pain with urination?: No Get up at night to urinate?: No Leakage of urine?: Yes Urine stream starts and stops?: Yes Trouble starting stream?: Yes Do you have to strain to urinate?: Yes Blood in urine?: No Urinary tract infection?: No Sexually transmitted disease?: No Injury to kidneys or bladder?: No Painful intercourse?: No Weak stream?: Yes Currently pregnant?: No Vaginal bleeding?: No Last menstrual period?: n  Gastrointestinal Nausea?: No Vomiting?: No Indigestion/heartburn?: Yes Diarrhea?: No Constipation?: No  Constitutional Fever: No Night sweats?: No Weight loss?: No Fatigue?: No  Skin Skin rash/lesions?: No Itching?: No  Eyes Blurred vision?: Yes Double vision?: No  Ears/Nose/Throat Sore throat?: No Sinus problems?: No  Hematologic/Lymphatic Swollen glands?: Yes Easy bruising?: No  Cardiovascular Leg swelling?: Yes Chest pain?: No  Respiratory Cough?: Yes Shortness of breath?: No  Endocrine Excessive thirst?: No  Musculoskeletal Back pain?: Yes Joint pain?:  No  Neurological Headaches?: Yes Dizziness?: No  Psychologic Depression?: No Anxiety?: No  Physical Exam: BP 126/88   Pulse 83   Ht 5' 4.5" (1.638 m)   Wt 230 lb 8 oz (104.6 kg)   BMI 38.95 kg/m   Constitutional: Well nourished. Alert and oriented, No acute distress. HEENT: [place] AT, moist mucus membranes. Trachea midline, no masses. Cardiovascular: No clubbing, cyanosis, or edema. Respiratory: Normal respiratory effort, no increased work of breathing. GI: Abdomen is soft, non tender, non distended, no abdominal masses. Liver and spleen not palpable.  No hernias appreciated.  Stool sample for occult testing is not indicated.  Normal external genitalia, normal pubic hair distribution, no lesions.  Normal urethral meatus, no lesions, no prolapse, no discharge.   No urethral masses, tenderness and/or tenderness. No bladder fullness, tenderness or masses. Normal vagina mucosa, good estrogen effect, no discharge, no lesions, good pelvic support, Grade I cystocele is noted.  No rectocele noted.  Uterus, cervix and adnexa are surgically absent.  Anus and perineum are without rashes or  lesions.    GU: No CVA tenderness.  No bladder fullness or masses.   Skin: No rashes, bruises or suspicious lesions. Lymph: No cervical or inguinal adenopathy. Neurologic: Grossly intact, no focal deficits, moving all 4 extremities. Psychiatric: Normal mood and affect.  Laboratory Data: Lab Results  Component Value Date   WBC 6.6 10/09/2016   HGB 12.6 10/09/2016   HCT 36.6 10/09/2016   MCV 92.1 10/09/2016   PLT 194 10/09/2016    Lab Results  Component Value Date   CREATININE 0.70 10/09/2016     Lab Results  Component Value Date   TSH 1.753 09/11/2015    Lab Results  Component Value Date   AST 28 10/08/2016   Lab Results  Component Value Date   ALT 30 10/08/2016     Urinalysis Unremarkable.  See EPIC.    Pertinent Imaging: CLINICAL DATA:  51 year old female with obstructive uropathy  on the right in March and right ureteral stent placement at that time.  EXAM: RENAL / URINARY TRACT ULTRASOUND COMPLETE  COMPARISON:  Fluoroscopic images from right ureteral stent 10/09/2016. CT Abdomen and Pelvis 10/08/2016  FINDINGS: Right Kidney:  Length: 11.9 cm. Echogenicity within normal limits. No mass visualized. Minimal to mild right hydronephrosis on prevoid images (image 12) which is resolved postvoid (image 33). No right ureteral stent identified.  Left Kidney:  Length: 12.2 cm. Echogenicity within normal limits. No mass or hydronephrosis visualized.  Bladder:  Appears normal for degree of bladder distention. No ureteral stent identified. Both ureteral jets detected with Doppler.  IMPRESSION: 1. Minimal to mild right side hydronephrosis prior to voiding which resolved after voiding. 2. Otherwise normal sonographic appearance of both kidneys and the urinary bladder.   Electronically Signed   By: Genevie Ann M.D.   On: 11/01/2016 16:56     Assessment & Plan:    1. Right ureteral stone  - Stone composition is 45% calcium oxalate dihydrate, 45% calcium oxalate monohydrate and 10% calcium phosphate.  - Offered to 24 hour metabolic analysis - patient would like to pursue study - orders sent  2. Right hydronephrosis  - the hydronephrosis has resolved.    3. Microscopic hematuria  - UA today is unremarkable  - continue to monitor the patient's UA after the treatment/passage of the stone to ensure the hematuria has resolved  - if hematuria persists, we will pursue a hematuria workup with CT Urogram and cystoscopy if appropriate.  4. SUI  - offered behavioral therapies, bladder training, bladder control strategies and pelvic floor muscle training - Patient does not want to pursue physical therapy at this time due to cost, but I did give her the handout on Kegel exercises  - fluid management - encouraged patient to increase her water intake especially  sent she is a recurrent stone former  - RTC in symptoms worsen   These notes generated with voice recognition software. I apologize for typographical errors.  Zara Council, [facility] [address] [place], [place] [address] [phone]

## 2016-11-08 ENCOUNTER — Encounter: Payer: Self-pay | Admitting: Urology

## 2016-11-08 ENCOUNTER — Ambulatory Visit (INDEPENDENT_AMBULATORY_CARE_PROVIDER_SITE_OTHER): Payer: No Typology Code available for payment source | Admitting: Urology

## 2016-11-08 VITALS — BP 126/88 | HR 83 | Ht 64.5 in | Wt 230.5 lb

## 2016-11-08 DIAGNOSIS — N393 Stress incontinence (female) (male): Secondary | ICD-10-CM

## 2016-11-08 DIAGNOSIS — N201 Calculus of ureter: Secondary | ICD-10-CM | POA: Diagnosis not present

## 2016-11-08 DIAGNOSIS — R3129 Other microscopic hematuria: Secondary | ICD-10-CM

## 2016-11-08 DIAGNOSIS — N132 Hydronephrosis with renal and ureteral calculous obstruction: Secondary | ICD-10-CM | POA: Diagnosis not present

## 2016-11-08 LAB — MICROSCOPIC EXAMINATION
Bacteria, UA: NONE SEEN
RBC, UA: NONE SEEN /hpf (ref 0–?)
WBC, UA: NONE SEEN /hpf (ref 0–?)

## 2016-11-08 LAB — URINALYSIS, COMPLETE
Bilirubin, UA: NEGATIVE
Glucose, UA: NEGATIVE
Ketones, UA: NEGATIVE
Leukocytes, UA: NEGATIVE
Nitrite, UA: NEGATIVE
Protein, UA: NEGATIVE
Specific Gravity, UA: 1.025 (ref 1.005–1.030)
Urobilinogen, Ur: 0.2 mg/dL (ref 0.2–1.0)
pH, UA: 5 (ref 5.0–7.5)

## 2017-07-27 ENCOUNTER — Encounter: Payer: Self-pay | Admitting: Emergency Medicine

## 2017-07-27 ENCOUNTER — Other Ambulatory Visit: Payer: Self-pay

## 2017-07-27 ENCOUNTER — Ambulatory Visit
Admission: EM | Admit: 2017-07-27 | Discharge: 2017-07-27 | Disposition: A | Discharge status: Home / Self Care | Payer: 59 | Attending: Emergency Medicine | Admitting: Emergency Medicine

## 2017-07-27 DIAGNOSIS — B958 Unspecified staphylococcus as the cause of diseases classified elsewhere: Secondary | ICD-10-CM

## 2017-07-27 DIAGNOSIS — L089 Local infection of the skin and subcutaneous tissue, unspecified: Secondary | ICD-10-CM | POA: Diagnosis not present

## 2017-07-27 MED ORDER — MUPIROCIN 2 % EX OINT
1.0000 | TOPICAL_OINTMENT | Freq: Three times a day (TID) | CUTANEOUS | 0 refills | Status: DC
Start: 2017-07-27 — End: 2018-10-03

## 2017-07-27 NOTE — ED Provider Notes (Addendum)
[facility]    CSN: [id number] Arrival date & time: 07/27/17  1325     History   Chief Complaint Chief Complaint  Patient presents with  . Abscess    HPI Tammy Young is a 52 y.o. female.   HPI  52 year old female with a circular sore on her superior left buttock.  States has been present since December 27.  She went on vacation in [place] and [place] and returned home with it.  She has no remembrance of any injury.  It is not been draining.  He has not been spreading.  She has no other lesions on the rest of her body.  Has felt tired and run down but has also recently undergone a CT scan because of a lung issue.  She denies any fever or chills.          Past Medical History:  Diagnosis Date  . Anxiety and depression   . Arthritis   . GERD (gastroesophageal reflux disease)   . Headache   . History of stomach ulcers   . HLD (hyperlipidemia)   . Hypertension   . Hypothyroidism   . Liver disease   . PONV (postoperative nausea and vomiting)   . 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  . Seizures [facility])     Patient Active Problem List   Diagnosis Date Noted  . Obstructive uropathy 10/08/2016  . Ureterolithiasis   . Acute right flank pain   . Intractable pain   . Intractable chronic migraine without aura and with status migrainosus 12/02/2015  . Chronic daily headache 11/16/2015  . Tension headache 11/16/2015    Past Surgical History:  Procedure Laterality Date  . BREAST SURGERY    . CARPAL TUNNEL RELEASE Bilateral   . CHOLECYSTECTOMY    . CYSTOSCOPY WITH URETEROSCOPY, STONE BASKETRY AND STENT PLACEMENT  10/09/2016   Procedure: CYSTOSCOPY WITH URETEROSCOPY, STONE BASKETRY AND STENT PLACEMENT;  Surgeon: Hollice Espy, MD;  Location: [facility];  Service: Urology;;  . DIRECT LARYNGOSCOPY Left 04/15/2015   Procedure: DIRECT LARYNGOSCOPY BIOPSY AND LASER LEFT TONGUE LESION;  Surgeon: Melissa Montane, MD;  Location: [place];  Service: ENT;  Laterality: Left;  . TONSILLECTOMY    . URETER SURGERY    . VAGINAL HYSTERECTOMY  2000    OB History    No data available       Home Medications    Prior to Admission medications   Medication Sig Start Date End Date Taking? Authorizing Provider  cyanocobalamin (,VITAMIN B-12,) 1000 MCG/ML injection Inject into the muscle. 09/15/14  Yes [provider]  Cyanocobalamin (VITAMIN B 12 PO) Take 5,000 mg by mouth.   Yes [provider]  escitalopram (LEXAPRO) 10 MG tablet Take by mouth. 10/17/16 07/27/17 Yes [provider]  HYDROcodone-acetaminophen (NORCO/VICODIN) 5-325 MG tablet Take 1 tablet by mouth every 6 (six) hours as needed for moderate pain. 10/10/16  Yes Wieting, Richard, MD  levothyroxine (SYNTHROID, LEVOTHROID) 25 MCG tablet Take 25 mcg by mouth daily before breakfast.   Yes [provider]  losartan (COZAAR) 25 MG tablet Take by mouth. 09/24/16 09/24/17 Yes [provider]  ranitidine (ZANTAC) 150 MG tablet Take 150 mg by mouth at bedtime.   Yes [provider]  mupirocin ointment (BACTROBAN) 2 % Apply 1 application topically 3 (three) times daily. 07/27/17   Lorin Picket, PA-C  oxybutynin (DITROPAN) 5 MG tablet Take  1 tablet (5 mg total) by mouth 2 (two) times daily. 10/10/16   Loletha Grayer, MD  valACYclovir (VALTREX) 500 MG tablet TAKE ONE CAPLET BY MOUTH TWICE DAILY 10/10/15   [provider]    Family History Family History  Problem Relation Age of Onset  . Kidney Stones Mother   . Diabetes Mother   . Kidney Stones Father   . Migraines Maternal Grandmother   . Stroke Maternal Grandmother   . Diabetes Maternal Grandmother   . Prostate cancer Maternal Grandfather   . Kidney cancer Neg Hx   . Bladder Cancer Neg Hx     Social History Social History   Tobacco Use  . Smoking status: Never Smoker  . Smokeless tobacco: Never Used  Substance  Use Topics  . Alcohol use: No  . Drug use: No     Allergies   Patient has no known allergies.   Review of Systems Review of Systems  Constitutional: Positive for fatigue. Negative for activity change, chills and fever.  Skin: Positive for rash.  All other systems reviewed and are negative.    Physical Exam Triage Vital Signs ED Triage Vitals  Enc Vitals Group     BP 07/27/17 1357 (!) 147/87     Pulse Rate 07/27/17 1357 85     Resp 07/27/17 1357 16     Temp 07/27/17 1357 98.1 F (36.7 C)     Temp Source 07/27/17 1357 Oral     SpO2 07/27/17 1357 97 %     Weight 07/27/17 1353 250 lb (113.4 kg)     Height 07/27/17 1353 5\' 5"  (1.651 m)     Head Circumference --      Peak Flow --      Pain Score 07/27/17 1353 0     Pain Loc --      Pain Edu? --      Excl. in [place]? --    No data found.  Updated Vital Signs BP (!) 147/87 (BP Location: Left Arm)   Pulse 85   Temp 98.1 F (36.7 C) (Oral)   Resp 16   Ht 5\' 5"  (1.651 m)   Wt 250 lb (113.4 kg)   SpO2 97%   BMI 41.60 kg/m   Visual Acuity Right Eye Distance:   Left Eye Distance:   Bilateral Distance:    Right Eye Near:   Left Eye Near:    Bilateral Near:     Physical Exam  Constitutional: She is oriented to person, place, and time. She appears well-developed and well-nourished. No distress.  HENT:  Head: Normocephalic.  Eyes: Pupils are equal, round, and reactive to light.  Neck: Normal range of motion.  Musculoskeletal: Normal range of motion.  Neurological: She is alert and oriented to person, place, and time.  Skin: Skin is warm and dry. She is not diaphoretic.  Refer to photographs for detail  Psychiatric: She has a normal mood and affect. Her behavior is normal. Judgment and thought content normal.  Nursing note and vitals reviewed.        UC Treatments / Results  Labs (all labs ordered are listed, but only abnormal results are displayed) Labs Reviewed - No data to display  EKG  EKG  Interpretation None       Radiology No results found.  Procedures Procedures (including critical care time)  Medications Ordered in UC Medications - No data to display   Initial Impression / Assessment and Plan / UC Course  I have  reviewed the triage vital signs and the nursing notes.  Pertinent labs & imaging results that were available during my care of the patient were reviewed by me and considered in my medical decision making (see chart for details).     Plan: 1. Test/x-ray results and diagnosis reviewed with patient 2. rx as per orders; risks, benefits, potential side effects reviewed with patient 3. Recommend supportive treatment with keeping area clean.  Apply Bactroban ointment 3 times daily cover with a dressing after each application.  Follow up with primaryr care or dermatology if is not improving in a week or 2. 4. F/u prn if symptoms worsen or don't improve   Final Clinical Impressions(s) / UC Diagnoses   Final diagnoses:  Staph skin infection    ED Discharge Orders        Ordered    mupirocin ointment (BACTROBAN) 2 %  3 times daily     07/27/17 1432       Controlled Substance Prescriptions [place] Controlled Substance Registry consulted? Not Applicable   Lorin Picket, PA-C 07/27/17 1445    Lorin Picket, PA-C 07/27/17 1447

## 2017-07-27 NOTE — ED Triage Notes (Signed)
Patient c/o abscess on her right buttock since Dec. 27.

## 2017-08-01 DIAGNOSIS — R05 Cough: Secondary | ICD-10-CM | POA: Insufficient documentation

## 2017-08-01 DIAGNOSIS — R053 Chronic cough: Secondary | ICD-10-CM | POA: Insufficient documentation

## 2017-09-12 ENCOUNTER — Other Ambulatory Visit: Payer: Self-pay | Admitting: Sports Medicine

## 2017-09-12 DIAGNOSIS — S32010D Wedge compression fracture of first lumbar vertebra, subsequent encounter for fracture with routine healing: Secondary | ICD-10-CM

## 2017-09-15 DIAGNOSIS — R0602 Shortness of breath: Secondary | ICD-10-CM | POA: Insufficient documentation

## 2017-09-23 ENCOUNTER — Ambulatory Visit: Payer: 59

## 2017-10-17 DIAGNOSIS — K1120 Sialoadenitis, unspecified: Secondary | ICD-10-CM | POA: Insufficient documentation

## 2017-10-18 DIAGNOSIS — G4733 Obstructive sleep apnea (adult) (pediatric): Secondary | ICD-10-CM

## 2017-10-18 HISTORY — DX: Obstructive sleep apnea (adult) (pediatric): G47.33

## 2017-12-09 ENCOUNTER — Ambulatory Visit: Payer: 59

## 2017-12-13 ENCOUNTER — Ambulatory Visit
Admission: RE | Admit: 2017-12-13 | Discharge: 2017-12-13 | Disposition: A | Discharge status: Home / Self Care | Payer: 59 | Source: Ambulatory Visit | Attending: Sports Medicine | Admitting: Sports Medicine

## 2017-12-13 DIAGNOSIS — S32010D Wedge compression fracture of first lumbar vertebra, subsequent encounter for fracture with routine healing: Secondary | ICD-10-CM | POA: Diagnosis not present

## 2017-12-13 DIAGNOSIS — G8929 Other chronic pain: Secondary | ICD-10-CM | POA: Insufficient documentation

## 2017-12-13 DIAGNOSIS — M5127 Other intervertebral disc displacement, lumbosacral region: Secondary | ICD-10-CM | POA: Insufficient documentation

## 2017-12-13 DIAGNOSIS — M5441 Lumbago with sciatica, right side: Secondary | ICD-10-CM | POA: Diagnosis present

## 2017-12-13 DIAGNOSIS — M438X6 Other specified deforming dorsopathies, lumbar region: Secondary | ICD-10-CM | POA: Insufficient documentation

## 2018-01-14 DIAGNOSIS — F3341 Major depressive disorder, recurrent, in partial remission: Secondary | ICD-10-CM | POA: Insufficient documentation

## 2018-02-10 IMAGING — CT CT RENAL STONE PROTOCOL
2 of 4 series · 16 of 46 positions shown, 18 images · non-contrast
Comparison: CT Abdomen 02/23/2015 and earlier.

CLINICAL DATA: 50-year-old female fell with acute right flank and
mid back pain. Reports she passed a small kidney stone over the
weekend.

EXAM:
CT ABDOMEN AND PELVIS WITHOUT CONTRAST
TECHNIQUE: Multidetector CT imaging of the abdomen and pelvis was performed
following the standard protocol without IV contrast.

[Series 2: stone full standard · axial · 0.84mm/px · z∈[-781,-331]mm · 13 of 100 slices shown, 15 images]
[im 5/100  soft-tissue]
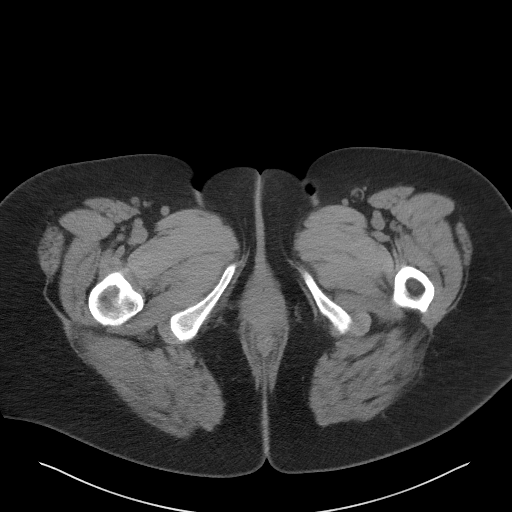
[im 5/100  bone]
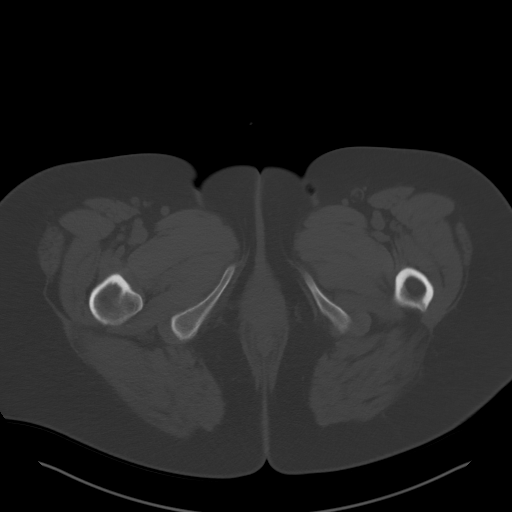
[im 13/100  soft-tissue]
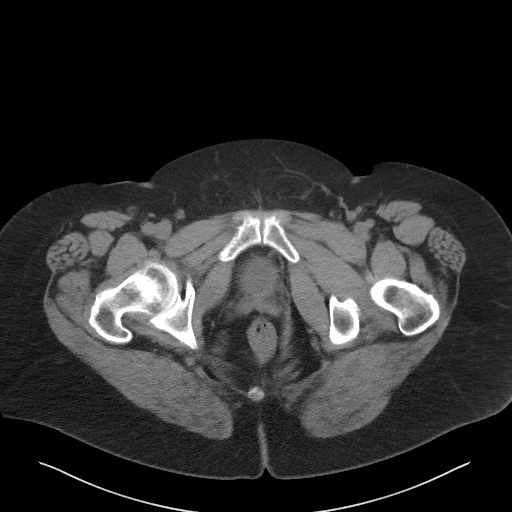
[im 22/100  soft-tissue]
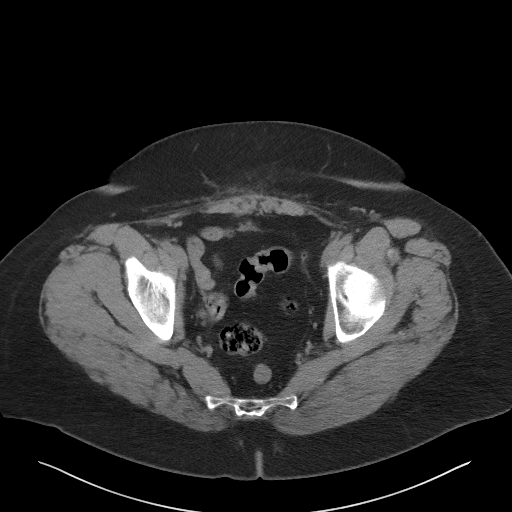
[im 26/100  soft-tissue]
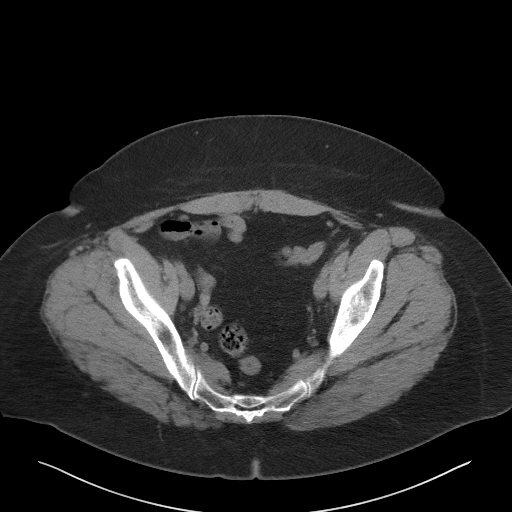
[im 35/100  soft-tissue]
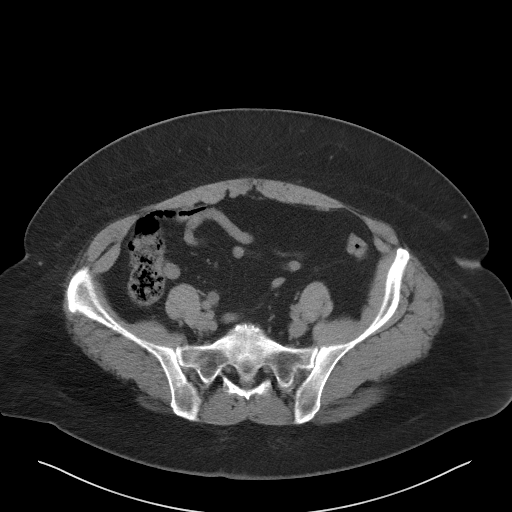
[im 44/100  soft-tissue]
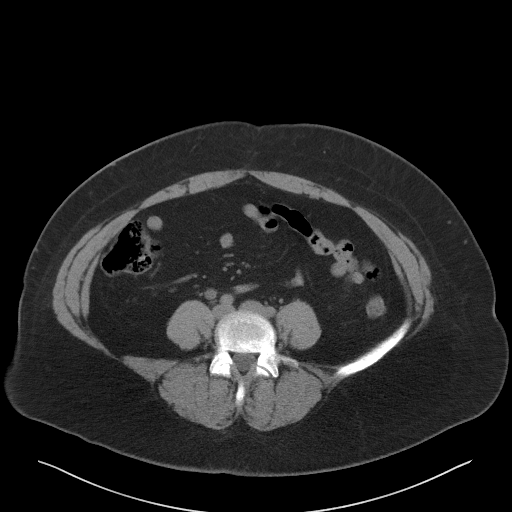
[im 52/100  soft-tissue]
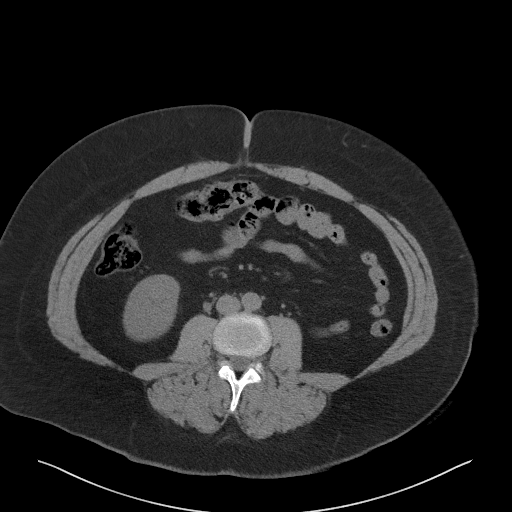
[im 56/100  soft-tissue]
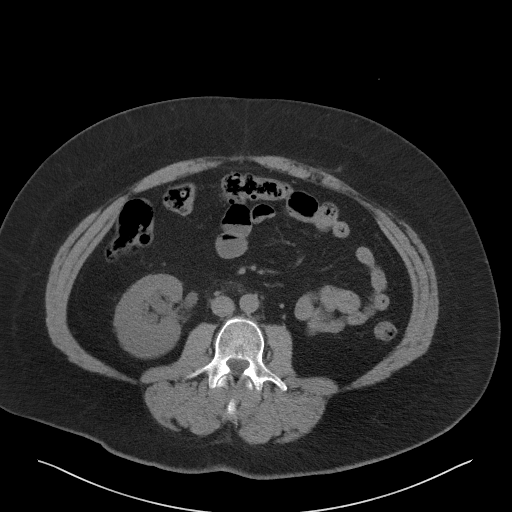
[im 65/100  soft-tissue]
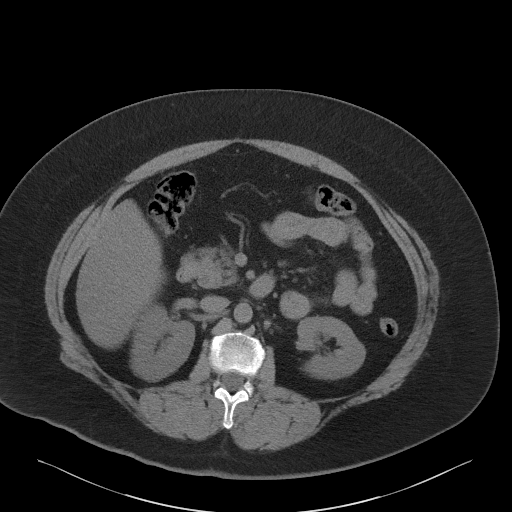
[im 65/100  bone]
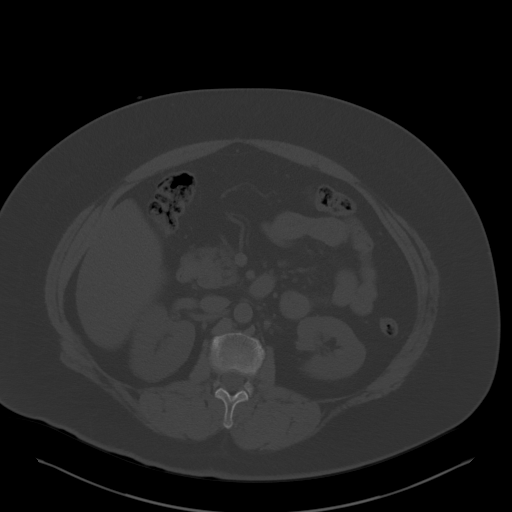
[im 74/100  soft-tissue]
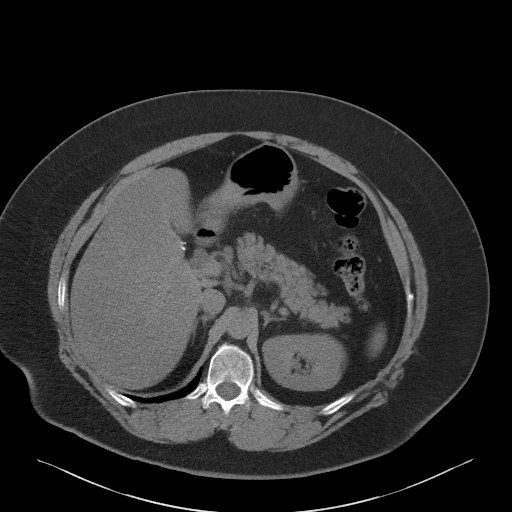
[im 78/100  soft-tissue]
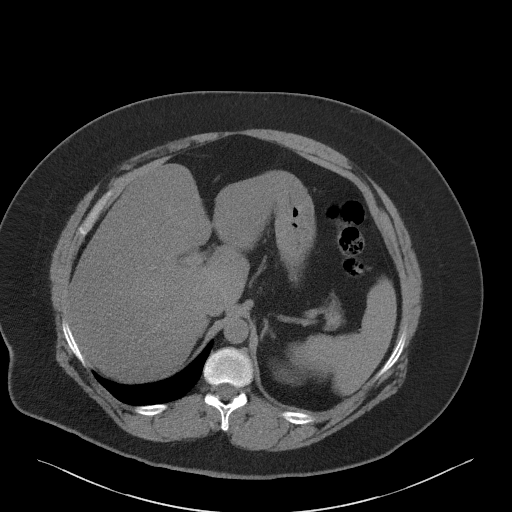
[im 87/100  soft-tissue]
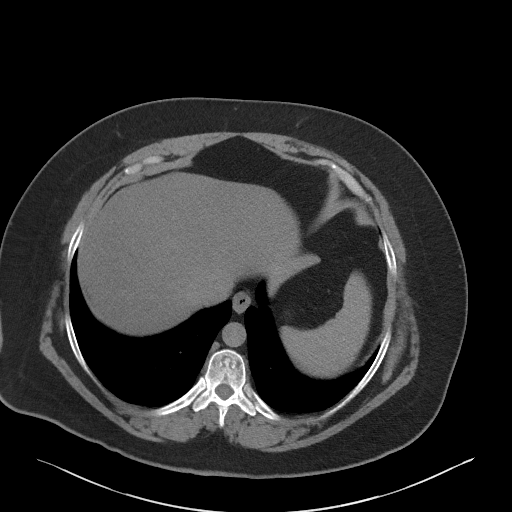
[im 95/100  soft-tissue]
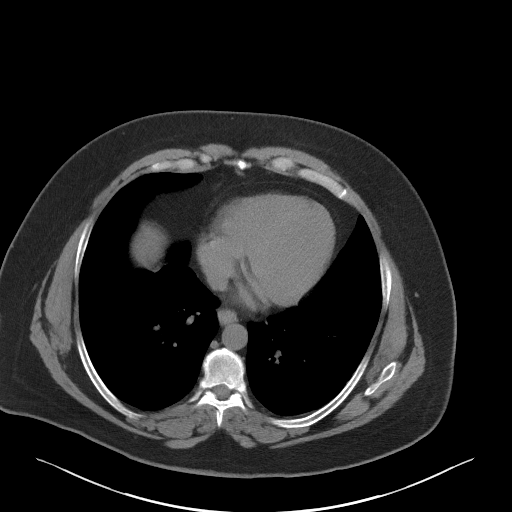

[Series 5: coronal · coronal · 0.87mm/px · 3 of 143 slices shown]
[im 48/143  soft-tissue]
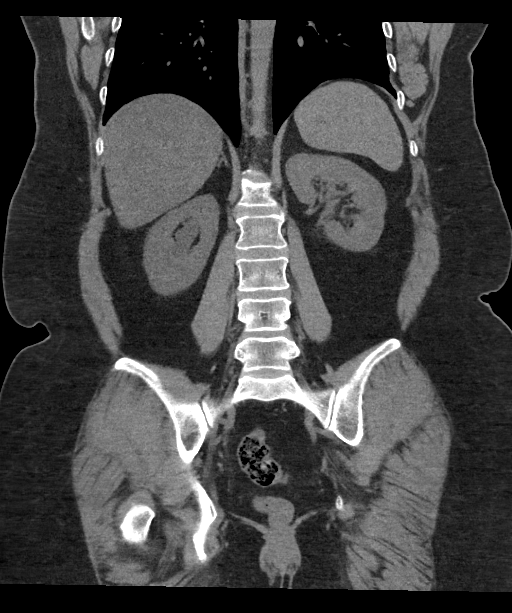
[im 64/143  soft-tissue]
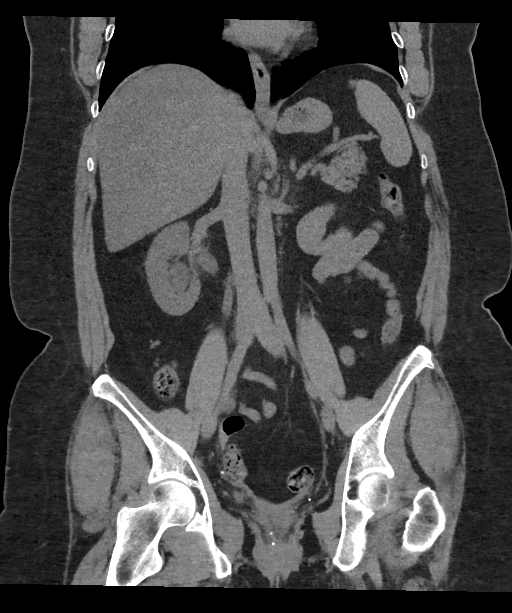
[im 79/143  soft-tissue]
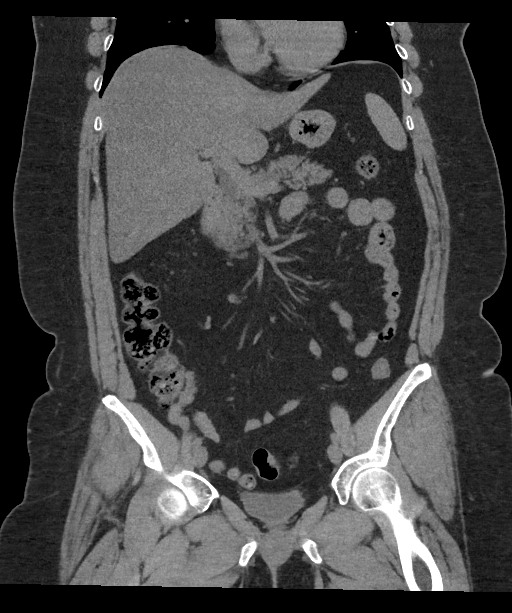

[16 of 46 positions shown; findings below may reference images not displayed]

FINDINGS: Lower chest: Normal lung bases.  No pericardial or pleural effusion.

Hepatobiliary: Surgically absent gallbladder as before. Geographic
decreased density in the right lobe of the noncontrast liver
compatible with steatosis.

Pancreas: Negative.

Spleen: Negative.

Adrenals/Urinary Tract: Normal adrenal glands.

The left kidney and left ureter are normal.

Mild to moderate right hydronephrosis and moderate to severe right
hydroureter. No perinephric stranding. No intrarenal calculus. The
right ureter remains dilated into the pelvis, and about 10 mm
proximal to the right ureterovesical junction there is a solitary 3
x 4 mm solitary versus two smaller adjacent obstructing calculi
(series 2, image 85 and coronal image 79. Multiple superimposed
bilateral calcified phleboliths. The urinary bladder is mostly
decompressed and unremarkable.

No abdominal free fluid.

Stomach/Bowel: Negative rectosigmoid colon. Negative left colon.
Negative transverse and right colon. The appendix is not identified
and is suspected to be surgically absent. Negative terminal ileum.
No dilated small bowel. Negative stomach and duodenum.

Vascular/Lymphatic: Vascular patency is not evaluated in the absence
of IV contrast. No lymphadenopathy.

Reproductive: Uterus is surgically absent. Adnexa are diminutive or
absent.

Other: No pelvic free fluid.

Musculoskeletal: Chronic L1 superior endplate compression. Stable
visualized osseous structures.
IMPRESSION: 1. Acute obstructive uropathy on the right with 3 x 4 mm - solitary
versus two small adjacent - obstructing stone(s) located about 10 mm
from the right UVJ.
2. No other urologic calculus identified.
3. Hepatic steatosis suspected.

## 2018-02-11 IMAGING — CR DG ABDOMEN 1V
2 series · 2 of 2 positions shown · non-contrast
Comparison: No priors.

CLINICAL DATA: 50-year-old female with history of cystoscopy in
ureteroscopy status post stent placement.

EXAM:
ABDOMEN - 1 VIEW; DG C-ARM 61-120 MIN

[cont. (1 of 2)]
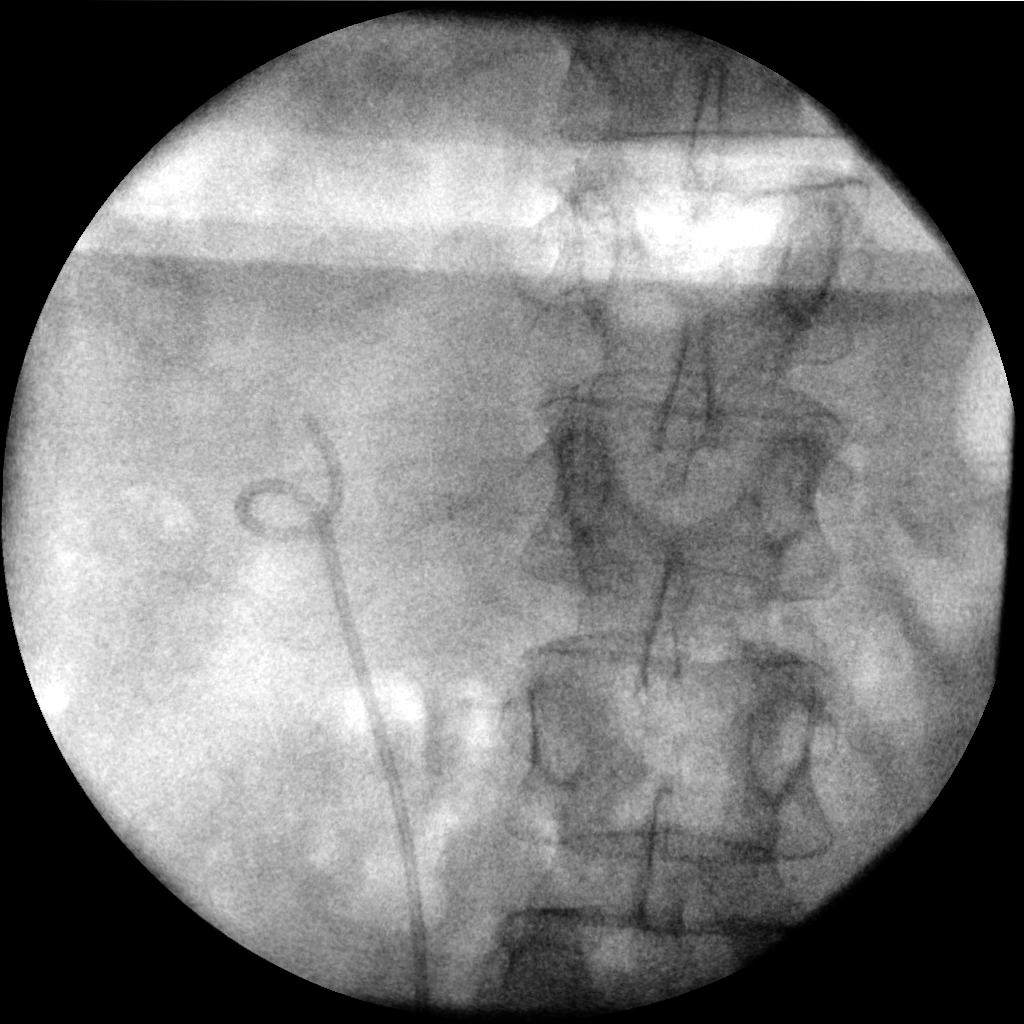

[cont. (2 of 2)]
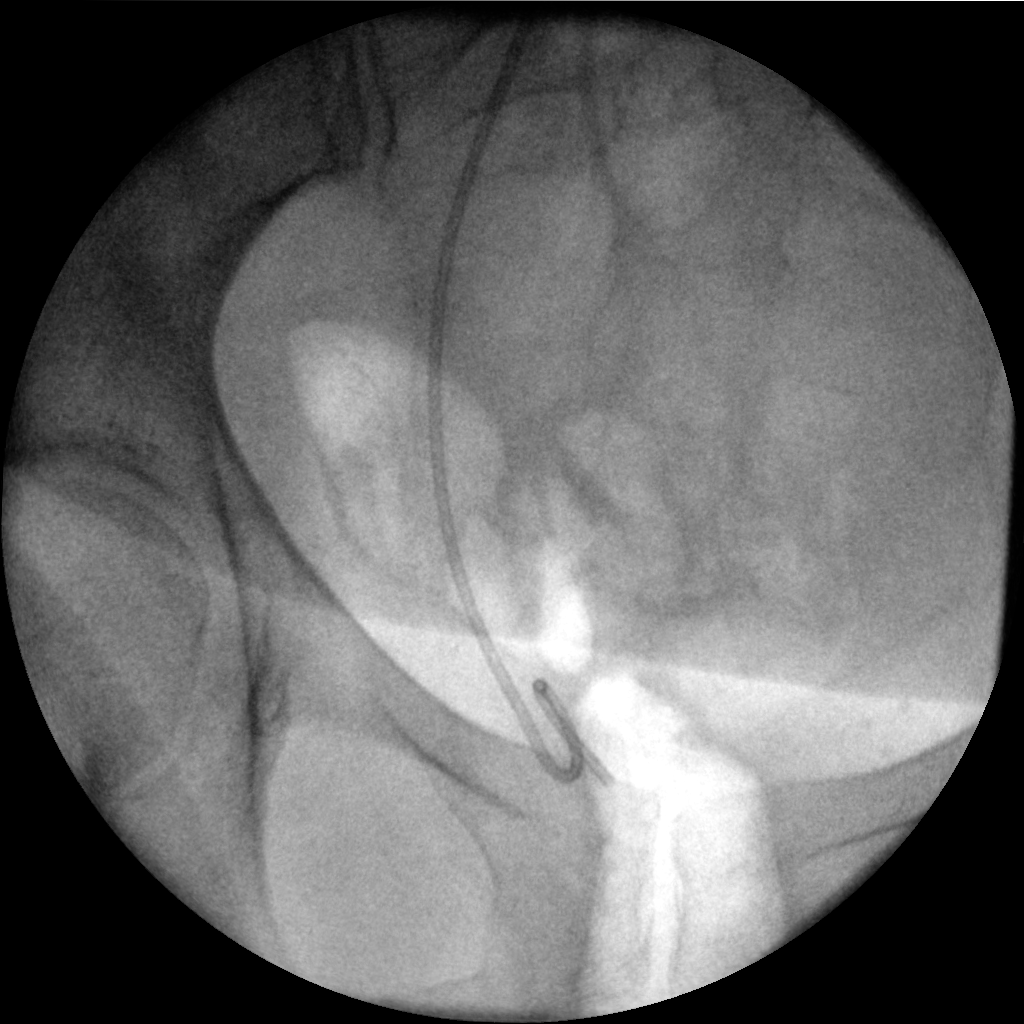

[2 of 2 positions shown; findings below may reference images not displayed]

FINDINGS: Two intraoperative fluoroscopic spot views of the abdomen and pelvis
demonstrate a right double-J ureteral stent, the proximal loop of
which appears reformed in the expected location of the right renal
pelvis, and the distal loop appears reformed likely in the right
side of the urinary bladder.
IMPRESSION: 1. Intraoperative documentation of right-sided ureteroscopy and
stent placement, as above.

## 2018-10-03 ENCOUNTER — Other Ambulatory Visit: Payer: Self-pay

## 2018-10-03 ENCOUNTER — Ambulatory Visit
Admission: EM | Admit: 2018-10-03 | Discharge: 2018-10-03 | Disposition: A | Discharge status: Home / Self Care | Payer: 59 | Attending: Family Medicine | Admitting: Family Medicine

## 2018-10-03 ENCOUNTER — Encounter: Payer: Self-pay | Admitting: Emergency Medicine

## 2018-10-03 DIAGNOSIS — R519 Headache, unspecified: Secondary | ICD-10-CM

## 2018-10-03 DIAGNOSIS — H9212 Otorrhea, left ear: Secondary | ICD-10-CM | POA: Diagnosis not present

## 2018-10-03 DIAGNOSIS — R51 Headache: Secondary | ICD-10-CM

## 2018-10-03 MED ORDER — BUTALBITAL-APAP-CAFFEINE 50-325-40 MG PO TABS: 1.0000 | ORAL_TABLET | Freq: Four times a day (QID) | ORAL | 0 refills | Status: AC | PRN

## 2018-10-03 MED ORDER — KETOROLAC TROMETHAMINE 60 MG/2ML IM SOLN
60.0000 mg | Freq: Once | INTRAMUSCULAR | Status: AC
  Administered 2018-10-03: 60 mg via INTRAMUSCULAR

## 2018-10-03 MED ORDER — BACLOFEN 10 MG PO TABS: 10.0000 mg | ORAL_TABLET | Freq: Three times a day (TID) | ORAL | 0 refills | Status: DC | PRN

## 2018-10-03 NOTE — ED Triage Notes (Signed)
Patient in today c/o headache off & on x a few months. Patient also complaining of bilateral ear itching and sharp pains in her left ear. Patient denies fever. Patient has tried OTC earache relief drops, which she has only used once. Patient also has tried [facility] for the headache without relief.

## 2018-10-03 NOTE — Discharge Instructions (Signed)
Follow up with your primary.   I recommend that you see Neurology again [facility] or [facility]).  Take care  Dr. Lacinda Axon

## 2018-10-04 NOTE — ED Provider Notes (Signed)
[facility]    CSN: [id number] Arrival date & time: 10/03/18  1429  History   Chief Complaint Chief Complaint  Patient presents with  . Headache  . Otalgia    left   HPI  53 year old female presents with the above complaints.  Patient is a longstanding history of headaches.  Has previously seen neurology with no improvement with several treatment options.  Was initially thought to be tension headache.  Later thought to be secondary to migraine.  She has taken Topamax, propranolol, tramadol in the past without resolution.  She has also seen pain management without a significant improvement in her frequent headaches.  Patient states that she continues to have ongoing headaches.  Worse as of late.  She also reports left ear pain.  Her pain is quite severe.  She has had no relief with over-the-counter BC powder.  She reports that her headaches are left-sided, located around the eye and then extends posteriorly.  Describes it as throbbing.  No associated nausea or photophobia.  No known exacerbating or relieving factors.  No other reported symptoms.  No other complaints.  PMH, Surgical Hx, Family Hx, Social History reviewed and updated as below.  Past Medical History:  Diagnosis Date  . Anxiety and depression   . Arthritis   . GERD (gastroesophageal reflux disease)   . Headache   . History of stomach ulcers   . HLD (hyperlipidemia)   . Hypertension   . Hypothyroidism   . Liver disease   . PONV (postoperative nausea and vomiting)   . 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  . Seizures [place])     Patient Active Problem List   Diagnosis Date Noted  . Obstructive uropathy 10/08/2016  . Ureterolithiasis   . Acute right flank pain   . Intractable pain   . Intractable chronic migraine without aura and with status migrainosus 12/02/2015  . Chronic daily headache 11/16/2015  . Tension headache 11/16/2015    Past Surgical  History:  Procedure Laterality Date  . BREAST SURGERY    . CARPAL TUNNEL RELEASE Bilateral   . CHOLECYSTECTOMY    . CYSTOSCOPY WITH URETEROSCOPY, STONE BASKETRY AND STENT PLACEMENT  10/09/2016   Procedure: CYSTOSCOPY WITH URETEROSCOPY, STONE BASKETRY AND STENT PLACEMENT;  Surgeon: Hollice Espy, MD;  Location: [facility];  Service: Urology;;  . DIRECT LARYNGOSCOPY Left 04/15/2015   Procedure: DIRECT LARYNGOSCOPY BIOPSY AND LASER LEFT TONGUE LESION;  Surgeon: Melissa Montane, MD;  Location: [place];  Service: ENT;  Laterality: Left;  . TONSILLECTOMY    . URETER SURGERY    . VAGINAL HYSTERECTOMY  2000    OB History   No obstetric history on file.      Home Medications    Prior to Admission medications   Medication Sig Start Date End Date Taking? Authorizing Provider  Cyanocobalamin (VITAMIN B 12 PO) Take 5,000 mg by mouth.   Yes [provider]  DULoxetine (CYMBALTA) 30 MG capsule Take 1 capsule by mouth daily. 01/14/18 01/14/19 Yes [provider]  levothyroxine (SYNTHROID, LEVOTHROID) 25 MCG tablet Take 25 mcg by mouth daily before breakfast.   Yes [provider]  ranitidine (ZANTAC) 150 MG tablet Take 150 mg by mouth at bedtime.   Yes [provider]  valACYclovir (VALTREX) 500 MG tablet TAKE ONE CAPLET BY MOUTH TWICE DAILY 10/10/15  Yes [provider]  baclofen (LIORESAL) 10 MG tablet Take 1 tablet (  10 mg total) by mouth 3 (three) times daily as needed. 10/03/18   Coral Spikes, DO  butalbital-acetaminophen-caffeine (FIORICET, ESGIC) 2038237159 MG tablet Take 1-2 tablets by mouth every 6 (six) hours as needed for headache. Do not exceed 6 tablets in 24 hours. 10/03/18 10/03/19  Coral Spikes, DO    Family History Family History  Problem Relation Age of Onset  . Kidney Stones Mother   . Diabetes Mother   . Kidney Stones Father   . Migraines Maternal Grandmother   . Stroke Maternal Grandmother   . Diabetes Maternal Grandmother    . Prostate cancer Maternal Grandfather   . Kidney cancer Neg Hx   . Bladder Cancer Neg Hx     Social History Social History   Tobacco Use  . Smoking status: Passive Smoke Exposure - Never Smoker  . Smokeless tobacco: Never Used  . Tobacco comment: mother smokes outside  Substance Use Topics  . Alcohol use: Yes    Comment: rarely  . Drug use: No     Allergies   Augmentin [amoxicillin-pot clavulanate]   Review of Systems Review of Systems  Constitutional: Negative.   HENT: Positive for ear pain.   Neurological: Positive for headaches.   Physical Exam Triage Vital Signs ED Triage Vitals  Enc Vitals Group     BP 10/03/18 1500 (!) 151/100     Pulse Rate 10/03/18 1500 89     Resp 10/03/18 1500 16     Temp 10/03/18 1500 98.3 F (36.8 C)     Temp Source 10/03/18 1500 Oral     SpO2 10/03/18 1500 98 %     Weight 10/03/18 1500 240 lb (108.9 kg)     Height 10/03/18 1500 5\' 5"  (1.651 m)     Head Circumference --      Peak Flow --      Pain Score 10/03/18 1459 9     Pain Loc --      Pain Edu? --      Excl. in [place]? --    Updated Vital Signs BP (!) 151/100 (BP Location: Left Arm)   Pulse 89   Temp 98.3 F (36.8 C) (Oral)   Resp 16   Ht 5\' 5"  (1.651 m)   Wt 108.9 kg   SpO2 98%   BMI 39.94 kg/m   Visual Acuity Right Eye Distance:   Left Eye Distance:   Bilateral Distance:    Right Eye Near:   Left Eye Near:    Bilateral Near:     Physical Exam Vitals signs and nursing note reviewed.  Constitutional:      General: She is not in acute distress.    Appearance: Normal appearance. She is well-developed. She is obese.  HENT:     Head: Normocephalic and atraumatic.     Right Ear: Tympanic membrane normal.     Left Ear: Tympanic membrane normal.     Nose: Nose normal.  Eyes:     General:        Right eye: No discharge.        Left eye: No discharge.     Conjunctiva/sclera: Conjunctivae normal.  Cardiovascular:     Rate and Rhythm: Normal rate and regular  rhythm.  Pulmonary:     Effort: Pulmonary effort is normal.     Breath sounds: Normal breath sounds.  Neurological:     General: No focal deficit present.     Mental Status: She is alert and oriented to person,  place, and time.  Psychiatric:        Mood and Affect: Mood normal.        Behavior: Behavior normal.    UC Treatments / Results  Labs (all labs ordered are listed, but only abnormal results are displayed) Labs Reviewed - No data to display  EKG None  Radiology No results found.  Procedures Procedures (including critical care time)  Medications Ordered in UC Medications  ketorolac (TORADOL) injection 60 mg (60 mg Intramuscular Given 10/03/18 1547)    Initial Impression / Assessment and Plan / UC Course  I have reviewed the triage vital signs and the nursing notes.  Pertinent labs & imaging results that were available during my care of the patient were reviewed by me and considered in my medical decision making (see chart for details).    53 year old female presents with chronic intractable headache.  Etiology is unclear.  Has some features of tension, throbbing character of migraine.  Could be trigeminal neuralgia.  Trial of Fioricet and baclofen.  Toradol given today without improvement.  Advised to follow-up with primary care physician.  Needs additional referral to see neurology again.  Final Clinical Impressions(s) / UC Diagnoses   Final diagnoses:  Chronic intractable headache, unspecified headache type     Discharge Instructions     Follow up with your primary.   I recommend that you see Neurology again [facility] or [facility]).  Take care  Dr. Lacinda Axon    ED Prescriptions    Medication Sig Dispense Auth. Provider   butalbital-acetaminophen-caffeine (FIORICET, ESGIC) 50-325-40 MG tablet Take 1-2 tablets by mouth every 6 (six) hours as needed for headache. Do not exceed 6 tablets in 24 hours. 20 tablet Kary Sugrue G, DO   baclofen (LIORESAL) 10 MG tablet Take 1  tablet (10 mg total) by mouth 3 (three) times daily as needed. 17 each Coral Spikes, DO     Controlled Substance Prescriptions [place] Controlled Substance Registry consulted? Not Applicable   Coral Spikes, [place] 10/04/18 [id number]

## 2018-11-12 ENCOUNTER — Other Ambulatory Visit: Payer: Self-pay | Admitting: Neurology

## 2018-11-12 DIAGNOSIS — G932 Benign intracranial hypertension: Secondary | ICD-10-CM

## 2018-11-13 ENCOUNTER — Ambulatory Visit
Admission: RE | Admit: 2018-11-13 | Discharge: 2018-11-13 | Disposition: A | Discharge status: Home / Self Care | Payer: 59 | Source: Ambulatory Visit | Attending: Neurology | Admitting: Neurology

## 2018-11-13 ENCOUNTER — Other Ambulatory Visit: Payer: Self-pay

## 2018-11-13 DIAGNOSIS — G932 Benign intracranial hypertension: Secondary | ICD-10-CM

## 2018-11-13 MED ORDER — ACETAMINOPHEN 500 MG PO TABS
1000.0000 mg | ORAL_TABLET | Freq: Four times a day (QID) | ORAL | Status: DC | PRN
  Administered 2018-11-13: 1000 mg via ORAL
  Filled 2018-11-13 (×2): qty 2

## 2018-11-13 MED ORDER — ACETAMINOPHEN 500 MG PO TABS
ORAL_TABLET | ORAL | Status: AC
  Filled 2018-11-13: qty 2

## 2018-11-13 NOTE — OR Nursing (Signed)
Sandwich meal served.  Ate 100% Coffee and coke for caffeine to help headache

## 2018-11-13 NOTE — Discharge Instructions (Signed)
Lumbar Puncture, Care After  This sheet gives you information about how to care for yourself after your procedure. Your health care provider may also give you more specific instructions. If you have problems or questions, contact your health care provider.  What can I expect after the procedure?  After the procedure, it is common to have:   Mild discomfort or pain at the puncture site.   A mild headache that is relieved with pain medicines.  Follow these instructions at home:  Activity     Lie down flat or rest for as long as directed by your health care provider.   Return to your normal activities as told by your health care provider. Ask your health care provider what activities are safe for you.   Avoid lifting anything heavier than 10 lb (4.5 kg) for at least 12 hours after the procedure.   Do not drive for 24 hours if you were given a medicine to help you relax (sedative) during your procedure.   Do not drive or use heavy machinery while taking prescription pain medicine.  Puncture site care   Remove or change your bandage (dressing) as told by your health care provider.   Check your puncture area every day for signs of infection. Check for:  ? More pain.  ? Redness or swelling.  ? Fluid or blood leaking from the puncture site.  ? Warmth.  ? Pus or a bad smell.  General instructions   Take over-the-counter and prescription medicines only as told by your health care provider.   Drink enough fluids to keep your urine clear or pale yellow. Your health care provider may recommend drinking caffeine to prevent a headache.   Keep all follow-up visits as told by your health care provider. This is important.  Contact a health care provider if:   You have fever or chills.   You have nausea or vomiting.   You have a headache that lasts for more than 2 days or does not get better with medicine.  Get help right away if:   You develop any of the following in your  legs:  ? Weakness.  ? Numbness.  ? Tingling.   You are unable to control when you urinate or have a bowel movement (incontinence).   You have signs of infection around your puncture site, such as:  ? More pain.  ? Redness or swelling.  ? Fluid or blood leakage.  ? Warmth.  ? Pus or a bad smell.   You are dizzy or you feel like you might faint.   You have a severe headache, especially when you sit or stand.  Summary   A lumbar puncture is a procedure in which a small needle is inserted into the lower back to remove fluid that surrounds the brain and spinal cord.   After this procedure, it is common to have a headache and pain around the needle insertion area.   Lying flat, staying hydrated, and drinking caffeine can help prevent headaches.   Monitor your needle insertion site for signs of infection, including warmth, fluid, or more pain.   Get help right away if you develop leg weakness, leg numbness, incontinence, or severe headaches.  This information is not intended to replace advice given to you by your health care provider. Make sure you discuss any questions you have with your health care provider.  Document Released: 07/14/2013 Document Revised: 08/22/2016 Document Reviewed: 08/22/2016  [employer]vier Interactive Patient Education  2019 [employer]

## 2018-11-25 ENCOUNTER — Other Ambulatory Visit: Payer: Self-pay | Admitting: Neurology

## 2018-11-25 DIAGNOSIS — G932 Benign intracranial hypertension: Secondary | ICD-10-CM

## 2018-12-04 ENCOUNTER — Ambulatory Visit
Admission: RE | Admit: 2018-12-04 | Discharge: 2018-12-04 | Disposition: A | Discharge status: Home / Self Care | Payer: No Typology Code available for payment source | Source: Ambulatory Visit | Attending: Neurology | Admitting: Neurology

## 2018-12-04 ENCOUNTER — Other Ambulatory Visit: Payer: Self-pay

## 2018-12-04 DIAGNOSIS — G932 Benign intracranial hypertension: Secondary | ICD-10-CM

## 2018-12-04 LAB — POCT I-STAT CREATININE: Creatinine, Ser: 1 mg/dL (ref 0.44–1.00)

## 2018-12-04 MED ORDER — GADOBUTROL 1 MMOL/ML IV SOLN
10.0000 mL | Freq: Once | INTRAVENOUS | Status: AC | PRN
  Administered 2018-12-04: 11:00:00 10 mL via INTRAVENOUS

## 2018-12-16 ENCOUNTER — Other Ambulatory Visit: Payer: Self-pay | Admitting: Physical Medicine and Rehabilitation

## 2018-12-16 DIAGNOSIS — M5416 Radiculopathy, lumbar region: Secondary | ICD-10-CM

## 2018-12-23 ENCOUNTER — Other Ambulatory Visit: Payer: Self-pay

## 2018-12-23 ENCOUNTER — Ambulatory Visit
Admission: RE | Admit: 2018-12-23 | Discharge: 2018-12-23 | Disposition: A | Discharge status: Home / Self Care | Payer: 59 | Source: Ambulatory Visit | Attending: Physical Medicine and Rehabilitation | Admitting: Physical Medicine and Rehabilitation

## 2018-12-23 DIAGNOSIS — M5416 Radiculopathy, lumbar region: Secondary | ICD-10-CM | POA: Diagnosis present

## 2019-03-13 ENCOUNTER — Other Ambulatory Visit: Payer: Self-pay

## 2019-03-13 DIAGNOSIS — Z20822 Contact with and (suspected) exposure to covid-19: Secondary | ICD-10-CM

## 2019-03-14 LAB — NOVEL CORONAVIRUS, NAA: SARS-CoV-2, NAA: NOT DETECTED

## 2019-05-08 ENCOUNTER — Ambulatory Visit
Admission: RE | Admit: 2019-05-08 | Discharge: 2019-05-08 | Disposition: A | Discharge status: Home / Self Care | Payer: 59 | Source: Ambulatory Visit | Attending: Family Medicine | Admitting: Family Medicine

## 2019-05-08 ENCOUNTER — Other Ambulatory Visit: Payer: Self-pay

## 2019-05-08 ENCOUNTER — Other Ambulatory Visit: Payer: Self-pay | Admitting: Family Medicine

## 2019-05-08 ENCOUNTER — Ambulatory Visit
Admission: RE | Admit: 2019-05-08 | Discharge: 2019-05-08 | Disposition: A | Discharge status: Home / Self Care | Payer: 59 | Attending: Family Medicine | Admitting: Family Medicine

## 2019-05-08 DIAGNOSIS — R053 Chronic cough: Secondary | ICD-10-CM

## 2019-05-08 DIAGNOSIS — R05 Cough: Secondary | ICD-10-CM | POA: Diagnosis not present

## 2019-06-12 ENCOUNTER — Other Ambulatory Visit: Payer: Self-pay | Admitting: Specialist

## 2019-06-12 DIAGNOSIS — R0602 Shortness of breath: Secondary | ICD-10-CM

## 2019-06-12 DIAGNOSIS — R053 Chronic cough: Secondary | ICD-10-CM

## 2019-06-12 DIAGNOSIS — R05 Cough: Secondary | ICD-10-CM

## 2019-06-26 ENCOUNTER — Ambulatory Visit: Payer: 59

## 2019-08-03 ENCOUNTER — Other Ambulatory Visit: Payer: Self-pay | Admitting: Gastroenterology

## 2019-08-03 DIAGNOSIS — R1013 Epigastric pain: Secondary | ICD-10-CM

## 2019-08-03 DIAGNOSIS — R05 Cough: Secondary | ICD-10-CM

## 2019-08-03 DIAGNOSIS — R053 Chronic cough: Secondary | ICD-10-CM

## 2019-08-06 ENCOUNTER — Ambulatory Visit: Payer: 59

## 2019-08-14 ENCOUNTER — Ambulatory Visit
Admission: RE | Admit: 2019-08-14 | Discharge: 2019-08-14 | Disposition: A | Discharge status: Home / Self Care | Payer: 59 | Source: Ambulatory Visit | Attending: Gastroenterology | Admitting: Gastroenterology

## 2019-08-14 ENCOUNTER — Other Ambulatory Visit
Admission: RE | Admit: 2019-08-14 | Discharge: 2019-08-14 | Disposition: A | Discharge status: Home / Self Care | Payer: 59 | Source: Home / Self Care | Attending: Gastroenterology | Admitting: Gastroenterology

## 2019-08-14 ENCOUNTER — Other Ambulatory Visit: Payer: Self-pay

## 2019-08-14 DIAGNOSIS — I890 Lymphedema, not elsewhere classified: Secondary | ICD-10-CM | POA: Insufficient documentation

## 2019-08-14 DIAGNOSIS — N393 Stress incontinence (female) (male): Secondary | ICD-10-CM | POA: Insufficient documentation

## 2019-08-14 DIAGNOSIS — R1013 Epigastric pain: Secondary | ICD-10-CM | POA: Insufficient documentation

## 2019-08-14 DIAGNOSIS — R05 Cough: Secondary | ICD-10-CM | POA: Diagnosis present

## 2019-08-14 DIAGNOSIS — R053 Chronic cough: Secondary | ICD-10-CM

## 2019-08-14 LAB — CREATININE, SERUM
Creatinine, Ser: 0.8 mg/dL (ref 0.44–1.00)
GFR calc Af Amer: >60 mL/min (ref >60)
GFR calc non Af Amer: >60 mL/min (ref >60)

## 2019-08-14 MED ORDER — IOHEXOL 300 MG/ML  SOLN
100.0000 mL | Freq: Once | INTRAMUSCULAR | Status: AC | PRN
  Administered 2019-08-14: 12:00:00 100 mL via INTRAVENOUS

## 2019-08-17 ENCOUNTER — Other Ambulatory Visit: Payer: Self-pay | Admitting: Certified Nurse Midwife

## 2019-08-17 ENCOUNTER — Other Ambulatory Visit: Payer: Self-pay | Admitting: Gerontology

## 2019-08-17 DIAGNOSIS — Z1231 Encounter for screening mammogram for malignant neoplasm of breast: Secondary | ICD-10-CM

## 2019-08-18 DIAGNOSIS — E559 Vitamin D deficiency, unspecified: Secondary | ICD-10-CM | POA: Insufficient documentation

## 2019-08-18 DIAGNOSIS — R7303 Prediabetes: Secondary | ICD-10-CM | POA: Insufficient documentation

## 2019-08-18 DIAGNOSIS — E538 Deficiency of other specified B group vitamins: Secondary | ICD-10-CM | POA: Insufficient documentation

## 2019-08-20 ENCOUNTER — Other Ambulatory Visit: Payer: Self-pay | Admitting: Gerontology

## 2019-08-20 ENCOUNTER — Ambulatory Visit
Admission: RE | Admit: 2019-08-20 | Discharge: 2019-08-20 | Disposition: A | Discharge status: Home / Self Care | Payer: 59 | Source: Ambulatory Visit | Attending: Gerontology | Admitting: Gerontology

## 2019-08-20 ENCOUNTER — Other Ambulatory Visit: Payer: Self-pay

## 2019-08-20 DIAGNOSIS — R2990 Unspecified symptoms and signs involving the nervous system: Secondary | ICD-10-CM | POA: Diagnosis not present

## 2019-08-20 DIAGNOSIS — M25551 Pain in right hip: Secondary | ICD-10-CM | POA: Insufficient documentation

## 2019-08-20 DIAGNOSIS — I83811 Varicose veins of right lower extremities with pain: Secondary | ICD-10-CM | POA: Insufficient documentation

## 2019-08-20 DIAGNOSIS — M79661 Pain in right lower leg: Secondary | ICD-10-CM | POA: Diagnosis present

## 2019-08-28 ENCOUNTER — Ambulatory Visit (INDEPENDENT_AMBULATORY_CARE_PROVIDER_SITE_OTHER): Payer: 59 | Admitting: Vascular Surgery

## 2019-08-28 ENCOUNTER — Other Ambulatory Visit: Payer: Self-pay

## 2019-08-28 VITALS — BP 162/88 | HR 85 | Resp 16 | Ht 64.5 in | Wt 255.6 lb

## 2019-08-28 DIAGNOSIS — E785 Hyperlipidemia, unspecified: Secondary | ICD-10-CM

## 2019-08-28 DIAGNOSIS — M7989 Other specified soft tissue disorders: Secondary | ICD-10-CM

## 2019-08-28 DIAGNOSIS — I10 Essential (primary) hypertension: Secondary | ICD-10-CM

## 2019-08-28 DIAGNOSIS — M79604 Pain in right leg: Secondary | ICD-10-CM

## 2019-08-28 DIAGNOSIS — M79609 Pain in unspecified limb: Secondary | ICD-10-CM | POA: Insufficient documentation

## 2019-08-28 DIAGNOSIS — M79605 Pain in left leg: Secondary | ICD-10-CM

## 2019-08-28 NOTE — Progress Notes (Signed)
Patient ID: Tammy Young, female   DOB: 14-Jun-1966, 54 y.o.   MRN: [id number]  Chief Complaint  Patient presents with  . New Patient (Initial Visit)    ref Payton Mccallum lymphedema,leg pain    HPI Tammy Young is a 54 y.o. female.  I am asked to see the patient by Littie Deeds, NP for evaluation of leg swelling.  The right leg is more severely affected the 2 legs but both legs are bothered.  She has a lot of chronic issues including back pain, arthritis, leg swelling, and multiple things that could be concerning for causing her pain.  There is no clear inciting event or causative factor that started the symptoms.  She has had multiple DVT studies which were negative.  She has not had ulceration or infection.  Nothing really helps the pain.  Elevating her legs may help slightly but not a lot.  Walking and activity makes it significantly worse.     Past Medical History:  Diagnosis Date  . Anxiety and depression   . Arthritis   . GERD (gastroesophageal reflux disease)   . Headache   . History of stomach ulcers   . HLD (hyperlipidemia)   . Hypertension   . Hypothyroidism   . Liver disease   . PONV (postoperative nausea and vomiting)   . 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  . Seizures ([place])    only had with preeclampsia during pregnancy at age 34. none since    Past Surgical History:  Procedure Laterality Date  . BREAST SURGERY    . CARPAL TUNNEL RELEASE Bilateral   . CHOLECYSTECTOMY    . CYSTOSCOPY WITH URETEROSCOPY, STONE BASKETRY AND STENT PLACEMENT  10/09/2016   Procedure: CYSTOSCOPY WITH URETEROSCOPY, STONE BASKETRY AND STENT PLACEMENT;  Surgeon: Hollice Espy, MD;  Location: [facility];  Service: Urology;;  . DIRECT LARYNGOSCOPY Left 04/15/2015   Procedure: DIRECT LARYNGOSCOPY BIOPSY AND LASER LEFT TONGUE LESION;  Surgeon: Melissa Montane, MD;  Location: [place];  Service: ENT;  Laterality: Left;  . TONSILLECTOMY      . URETER SURGERY    . VAGINAL HYSTERECTOMY  2000     Family History  Problem Relation Age of Onset  . Kidney Stones Mother   . Diabetes Mother   . Kidney Stones Father   . Migraines Maternal Grandmother   . Stroke Maternal Grandmother   . Diabetes Maternal Grandmother   . Prostate cancer Maternal Grandfather   . Kidney cancer Neg Hx   . Bladder Cancer Neg Hx     Social History   Tobacco Use  . Smoking status: Passive Smoke Exposure - Never Smoker  . Smokeless tobacco: Never Used  . Tobacco comment: mother smokes outside  Substance Use Topics  . Alcohol use: Yes    Comment: rarely  . Drug use: No     Allergies  Allergen Reactions  . Augmentin [Amoxicillin-Pot Clavulanate] Itching and Rash    Current Outpatient Medications  Medication Sig Dispense Refill  . albuterol (PROVENTIL) (2.5 MG/3ML) 0.083% nebulizer solution Inhale into the lungs.    . baclofen (LIORESAL) 10 MG tablet Take 1 tablet (10 mg total) by mouth 3 (three) times daily as needed. 30 each 0  . budesonide-formoterol (SYMBICORT) 160-4.5 MCG/ACT inhaler     . butalbital-acetaminophen-caffeine (FIORICET, ESGIC) 50-325-40 MG tablet Take 1-2 tablets by mouth every 6 (six) hours as needed for headache. Do not exceed  6 tablets in 24 hours. 20 tablet 0  . Cholecalciferol 50 MCG (2000 UT) CAPS Take 3 capsules daily for 3 months, then reduce to 1 capsule daily thereafter for Vitamin D Deficiency.    . Cyanocobalamin (VITAMIN B 12 PO) Take 5,000 mg by mouth.    . ergocalciferol (VITAMIN D2) 1.25 MG (50000 UT) capsule Take by mouth.    . fexofenadine (ALLEGRA) 180 MG tablet Take 180 mg by mouth daily.    Marland Kitchen losartan (COZAAR) 100 MG tablet Take 100 mg by mouth daily.    . pantoprazole (PROTONIX) 40 MG tablet Take 40 mg by mouth daily.    . RABEprazole (ACIPHEX) 20 MG tablet Take 20 mg by mouth daily.    . ranitidine (ZANTAC) 150 MG tablet Take 150 mg by mouth at bedtime.    . SUMAtriptan (IMITREX) 100 MG tablet TK 1  T PO ONCE PRF MIGRAINE. MAY TAKE 2ND DOSE AFTER 2 HOURS IF STILL NEEDED    . valACYclovir (VALTREX) 500 MG tablet TAKE ONE CAPLET BY MOUTH TWICE DAILY    . DULoxetine (CYMBALTA) 30 MG capsule Take 1 capsule by mouth daily.    Marland Kitchen levothyroxine (SYNTHROID, LEVOTHROID) 25 MCG tablet Take 25 mcg by mouth daily before breakfast.     No current facility-administered medications for this visit.   Facility-Administered Medications Ordered in Other Visits  Medication Dose Route Frequency Provider Last Rate Last Admin  . gadopentetate dimeglumine (MAGNEVIST) injection 20 mL  20 mL Intravenous Once PRN Garvin Fila, MD          REVIEW OF SYSTEMS (Negative unless checked)  Constitutional: [] Weight loss  [] Fever  [] Chills Cardiac: [] Chest pain   [] Chest pressure   [] Palpitations   [] Shortness of breath when laying flat   [] Shortness of breath at rest   [] Shortness of breath with exertion. Vascular:  [] Pain in legs with walking   [] Pain in legs at rest   [] Pain in legs when laying flat   [] Claudication   [] Pain in feet when walking  [] Pain in feet at rest  [] Pain in feet when laying flat   [] History of DVT   [] Phlebitis   [] Swelling in legs   [] Varicose veins   [] Non-healing ulcers Pulmonary:   [] Uses home oxygen   [] Productive cough   [] Hemoptysis   [] Wheeze  [] COPD   [] Asthma Neurologic:  [] Dizziness  [] Blackouts   [x] Seizures   [] History of stroke   [] History of TIA  [] Aphasia   [] Temporary blindness   [] Dysphagia   [] Weakness or numbness in arms   [] Weakness or numbness in legs Musculoskeletal:  [x] Arthritis   [] Joint swelling   [] Joint pain   [] Low back pain Hematologic:  [] Easy bruising  [] Easy bleeding   [] Hypercoagulable state   [] Anemic  [] Hepatitis Gastrointestinal:  [] Blood in stool   [] Vomiting blood  [x] Gastroesophageal reflux/heartburn   [] Abdominal pain Genitourinary:  [] Chronic kidney disease   [] Difficult urination  [] Frequent urination  [] Burning with urination   [] Hematuria Skin:   [] Rashes   [] Ulcers   [] Wounds Psychological:  [x] History of anxiety   [x]  History of major depression.    Physical Exam BP (!) 162/88 (BP Location: Right Arm)   Pulse 85   Resp 16   Ht 5' 4.5" (1.638 m)   Wt 255 lb 9.6 oz (115.9 kg)   BMI 43.20 kg/m  Gen:  WD/WN, NAD.  Obese Head: [place]/AT, No temporalis wasting.  Ear/Nose/Throat: Hearing grossly intact, nares w/o erythema or drainage, oropharynx w/o Erythema/Exudate  Eyes: Conjunctiva clear, sclera non-icteric  Neck: trachea midline.  No JVD.  Pulmonary:  Good air movement, respirations not labored, no use of accessory muscles  Cardiac: RRR, no JVD Vascular:  Vessel Right Left  Radial Palpable Palpable                          DP  1+  1+  PT  trace  1+   Gastrointestinal:. No masses, surgical incisions, or scars. Musculoskeletal: M/S 5/5 throughout.  Extremities without ischemic changes.  No deformity or atrophy.  2+ right lower extremity edema, 1+ left lower extremity edema. Neurologic: Sensation grossly intact in extremities.  Symmetrical.  Speech is fluent. Motor exam as listed above. Psychiatric: Judgment intact, Mood & affect appropriate for pt's clinical situation. Dermatologic: No rashes or ulcers noted.  No cellulitis or open wounds.    Radiology CT HEAD WO CONTRAST  Result Date: 08/20/2019 CLINICAL DATA:  Unsteady gait. Speech disturbance. Left facial numbness. Symptoms over the last week. EXAM: CT HEAD WITHOUT CONTRAST TECHNIQUE: Contiguous axial images were obtained from the base of the skull through the vertex without intravenous contrast. COMPARISON:  None. FINDINGS: Brain: The brain shows a normal appearance without evidence of malformation, atrophy, old or acute small or large vessel infarction, mass lesion, hemorrhage, hydrocephalus or extra-axial collection. Vascular: No hyperdense vessel. No evidence of atherosclerotic calcification. Skull: Normal.  No traumatic finding.  No focal bone lesion.  Sinuses/Orbits: Sinuses are clear. Orbits appear normal. Mastoids are clear. Other: None significant IMPRESSION: Normal head CT. Electronically Signed   By: Nelson Chimes M.D.   On: 08/20/2019 12:51   CT CHEST W CONTRAST  Result Date: 08/14/2019 CLINICAL DATA:  Chronic cough and shortness of breath. Chronic epigastric pain and bloating. Nausea and diarrhea. EXAM: CT CHEST, ABDOMEN, AND PELVIS WITH CONTRAST TECHNIQUE: Multidetector CT imaging of the chest, abdomen and pelvis was performed following the standard protocol during bolus administration of intravenous contrast. CONTRAST:  118mL OMNIPAQUE IOHEXOL 300 MG/ML  SOLN COMPARISON:  Chest CT only on 08/04/2013 FINDINGS: CT CHEST FINDINGS Cardiovascular: No acute findings. Mediastinum/Lymph Nodes: No masses or pathologically enlarged lymph nodes identified. Lungs/Pleura: No pulmonary infiltrate or mass identified. No effusion present. Musculoskeletal: No suspicious bone lesions identified. Old T4 vertebral body compression fracture deformity remains stable. CT ABDOMEN AND PELVIS FINDINGS Hepatobiliary: No masses identified. Mild-to-moderate diffuse hepatic steatosis is seen. Prior cholecystectomy. No evidence of biliary obstruction. Pancreas:  No mass or inflammatory changes. Spleen:  Within normal limits in size and appearance. Adrenals/Urinary tract:  No masses or hydronephrosis. Stomach/Bowel: No evidence of obstruction, inflammatory process, or abnormal fluid collections. Vascular/Lymphatic: No pathologically enlarged lymph nodes identified. No abdominal aortic aneurysm. Reproductive: Prior hysterectomy noted. Adnexal regions are unremarkable in appearance. Other:  None. Musculoskeletal: No suspicious bone lesions identified. Old compression fracture deformity of L1 vertebral body noted. IMPRESSION: 1. No acute findings within the chest, abdomen, or pelvis. 2. Hepatic steatosis. Electronically Signed   By: Marlaine Hind M.D.   On: 08/14/2019 12:39   CT  ABDOMEN PELVIS W CONTRAST  Result Date: 08/14/2019 CLINICAL DATA:  Chronic cough and shortness of breath. Chronic epigastric pain and bloating. Nausea and diarrhea. EXAM: CT CHEST, ABDOMEN, AND PELVIS WITH CONTRAST TECHNIQUE: Multidetector CT imaging of the chest, abdomen and pelvis was performed following the standard protocol during bolus administration of intravenous contrast. CONTRAST:  164mL OMNIPAQUE IOHEXOL 300 MG/ML  SOLN COMPARISON:  Chest CT only on 08/04/2013 FINDINGS: CT CHEST  FINDINGS Cardiovascular: No acute findings. Mediastinum/Lymph Nodes: No masses or pathologically enlarged lymph nodes identified. Lungs/Pleura: No pulmonary infiltrate or mass identified. No effusion present. Musculoskeletal: No suspicious bone lesions identified. Old T4 vertebral body compression fracture deformity remains stable. CT ABDOMEN AND PELVIS FINDINGS Hepatobiliary: No masses identified. Mild-to-moderate diffuse hepatic steatosis is seen. Prior cholecystectomy. No evidence of biliary obstruction. Pancreas:  No mass or inflammatory changes. Spleen:  Within normal limits in size and appearance. Adrenals/Urinary tract:  No masses or hydronephrosis. Stomach/Bowel: No evidence of obstruction, inflammatory process, or abnormal fluid collections. Vascular/Lymphatic: No pathologically enlarged lymph nodes identified. No abdominal aortic aneurysm. Reproductive: Prior hysterectomy noted. Adnexal regions are unremarkable in appearance. Other:  None. Musculoskeletal: No suspicious bone lesions identified. Old compression fracture deformity of L1 vertebral body noted. IMPRESSION: 1. No acute findings within the chest, abdomen, or pelvis. 2. Hepatic steatosis. Electronically Signed   By: Marlaine Hind M.D.   On: 08/14/2019 12:39   US Venous Img Lower Unilateral Right (DVT)  Result Date: 08/20/2019 CLINICAL DATA:  Right lower extremity pain EXAM: RIGHT LOWER EXTREMITY VENOUS DUPLEX ULTRASOUND TECHNIQUE: Gray-scale sonography with  graded compression, as well as color Doppler and duplex ultrasound were performed to evaluate the right lower extremity deep venous system from the level of the common femoral vein and including the common femoral, femoral, profunda femoral, popliteal and calf veins including the posterior tibial, peroneal and gastrocnemius veins when visible. The superficial great saphenous vein was also interrogated. Spectral Doppler was utilized to evaluate flow at rest and with distal augmentation maneuvers in the common femoral, femoral and popliteal veins. COMPARISON:  September 11, 2015 FINDINGS: Contralateral Common Femoral Vein: Respiratory phasicity is normal and symmetric with the symptomatic side. No evidence of thrombus. Normal compressibility. Common Femoral Vein: No evidence of thrombus. Normal compressibility, respiratory phasicity and response to augmentation. Saphenofemoral Junction: No evidence of thrombus. Normal compressibility and flow on color Doppler imaging. Profunda Femoral Vein: No evidence of thrombus. Normal compressibility and flow on color Doppler imaging. Femoral Vein: No evidence of thrombus. Normal compressibility, respiratory phasicity and response to augmentation. Popliteal Vein: No evidence of thrombus. Normal compressibility, respiratory phasicity and response to augmentation. Calf Veins: No evidence of thrombus. Normal compressibility and flow on color Doppler imaging. Superficial Great Saphenous Vein: No evidence of thrombus. Normal compressibility. Venous Reflux:  None. Other Findings:  None. IMPRESSION: No evidence of deep venous thrombosis in the right lower extremity. Left common femoral vein also patent. Electronically Signed   By: Lowella Grip III M.D.   On: 08/20/2019 13:46    Labs Recent Results (from the past 2160 hour(s))  Creatinine, serum     Status: None   Collection Time: 08/14/19 11:39 AM  Result Value Ref Range   Creatinine, Ser 0.80 0.44 - 1.00 mg/dL   GFR calc  non Af Amer >60 >60 mL/min   GFR calc Af Amer >60 >60 mL/min    Comment: Performed at [facility], [address] [place], [place] [address]    Assessment/Plan:  Essential hypertension blood pressure control important in reducing the progression of atherosclerotic disease. On appropriate oral medications.   Hyperlipidemia, unspecified lipid control important in reducing the progression of atherosclerotic disease.    Swelling of limb I have had a long discussion with the patient regarding swelling and why it  causes symptoms.  Patient will begin wearing graduated compression stockings class 1 (20-30 mmHg) on a daily basis a prescription was given. The patient will  beginning wearing the stockings first thing in the morning and removing them in the evening. The patient is instructed specifically not to sleep in the stockings.   In addition, behavioral modification will be initiated.  This will include frequent elevation, use of over the counter pain medications and exercise such as walking.  I have reviewed systemic causes for chronic edema such as liver, kidney and cardiac etiologies.  The patient denies problems with these organ systems.    Consideration for a lymph pump will also be made based upon the effectiveness of conservative therapy.  This would help to improve the edema control and prevent sequela such as ulcers and infections   Patient should undergo duplex ultrasound of the venous system to ensure that DVT or reflux is not present.  The patient will follow-up with me after the ultrasound.    Pain in limb  Recommend:  The patient has atypical pain symptoms for pure atherosclerotic disease. However, on physical exam there is evidence of mixed venous and arterial disease, given the diminished pulses and the edema associated with venous changes of the legs.  Noninvasive studies including ABI's and venous ultrasound of the legs will be obtained and the patient  will follow up with me to review these studies.  I suspect the patient is c/o pseudoclaudication.  Patient should have an evaluation of his LS spine which I defer to the primary service.  The patient should continue walking and begin a more formal exercise program. The patient should continue his antiplatelet therapy and aggressive treatment of the lipid abnormalities.  The patient should begin wearing graduated compression socks 15-20 mmHg strength to control edema.       Leotis Pain 08/28/2019, 12:35 PM   This note was created with Dragon medical transcription system.  Any errors from dictation are unintentional.

## 2019-08-28 NOTE — Assessment & Plan Note (Signed)
Recommend:  The patient has atypical pain symptoms for pure atherosclerotic disease. However, on physical exam there is evidence of mixed venous and arterial disease, given the diminished pulses and the edema associated with venous changes of the legs.  Noninvasive studies including ABI's and venous ultrasound of the legs will be obtained and the patient will follow up with me to review these studies.  I suspect the patient is c/o pseudoclaudication.  Patient should have an evaluation of his LS spine which I defer to the primary service.  The patient should continue walking and begin a more formal exercise program. The patient should continue his antiplatelet therapy and aggressive treatment of the lipid abnormalities.  The patient should begin wearing graduated compression socks 15-20 mmHg strength to control edema.  

## 2019-08-28 NOTE — Assessment & Plan Note (Signed)
blood pressure control important in reducing the progression of atherosclerotic disease. On appropriate oral medications.  

## 2019-08-28 NOTE — Assessment & Plan Note (Signed)
lipid control important in reducing the progression of atherosclerotic disease.   

## 2019-08-28 NOTE — Assessment & Plan Note (Signed)

## 2019-08-28 NOTE — Patient Instructions (Signed)

## 2019-08-31 ENCOUNTER — Other Ambulatory Visit: Payer: Self-pay | Admitting: Gerontology

## 2019-08-31 DIAGNOSIS — M8589 Other specified disorders of bone density and structure, multiple sites: Secondary | ICD-10-CM | POA: Insufficient documentation

## 2019-08-31 DIAGNOSIS — Z1239 Encounter for other screening for malignant neoplasm of breast: Secondary | ICD-10-CM

## 2019-08-31 DIAGNOSIS — N644 Mastodynia: Secondary | ICD-10-CM

## 2019-09-01 ENCOUNTER — Other Ambulatory Visit
Admission: RE | Admit: 2019-09-01 | Discharge: 2019-09-01 | Disposition: A | Discharge status: Home / Self Care | Payer: 59 | Source: Ambulatory Visit | Attending: Internal Medicine | Admitting: Internal Medicine

## 2019-09-01 ENCOUNTER — Other Ambulatory Visit: Payer: Self-pay

## 2019-09-01 DIAGNOSIS — Z20822 Contact with and (suspected) exposure to covid-19: Secondary | ICD-10-CM | POA: Insufficient documentation

## 2019-09-01 DIAGNOSIS — Z01812 Encounter for preprocedural laboratory examination: Secondary | ICD-10-CM | POA: Insufficient documentation

## 2019-09-01 LAB — SARS CORONAVIRUS 2 (TAT 6-24 HRS): SARS Coronavirus 2: NEGATIVE

## 2019-09-02 ENCOUNTER — Encounter: Payer: Self-pay | Admitting: Internal Medicine

## 2019-09-03 ENCOUNTER — Encounter: Payer: Self-pay | Admitting: Internal Medicine

## 2019-09-03 ENCOUNTER — Other Ambulatory Visit: Payer: Self-pay

## 2019-09-03 ENCOUNTER — Ambulatory Visit: Payer: 59 | Admitting: Registered Nurse

## 2019-09-03 ENCOUNTER — Ambulatory Visit
Admission: RE | Admit: 2019-09-03 | Discharge: 2019-09-03 | Disposition: A | Discharge status: Home / Self Care | Payer: 59 | Attending: Internal Medicine | Admitting: Internal Medicine

## 2019-09-03 ENCOUNTER — Encounter
Admission: RE | Disposition: A | Discharge status: Home / Self Care | Payer: Self-pay | Source: Home / Self Care | Attending: Internal Medicine

## 2019-09-03 DIAGNOSIS — Z79899 Other long term (current) drug therapy: Secondary | ICD-10-CM | POA: Insufficient documentation

## 2019-09-03 DIAGNOSIS — R569 Unspecified convulsions: Secondary | ICD-10-CM | POA: Insufficient documentation

## 2019-09-03 DIAGNOSIS — R1310 Dysphagia, unspecified: Secondary | ICD-10-CM | POA: Diagnosis not present

## 2019-09-03 DIAGNOSIS — R110 Nausea: Secondary | ICD-10-CM | POA: Diagnosis not present

## 2019-09-03 DIAGNOSIS — Z87442 Personal history of urinary calculi: Secondary | ICD-10-CM | POA: Insufficient documentation

## 2019-09-03 DIAGNOSIS — E785 Hyperlipidemia, unspecified: Secondary | ICD-10-CM | POA: Insufficient documentation

## 2019-09-03 DIAGNOSIS — K319 Disease of stomach and duodenum, unspecified: Secondary | ICD-10-CM | POA: Insufficient documentation

## 2019-09-03 DIAGNOSIS — K2100 Gastro-esophageal reflux disease with esophagitis, without bleeding: Secondary | ICD-10-CM | POA: Insufficient documentation

## 2019-09-03 DIAGNOSIS — Z8711 Personal history of peptic ulcer disease: Secondary | ICD-10-CM | POA: Diagnosis not present

## 2019-09-03 DIAGNOSIS — Z6841 Body Mass Index (BMI) 40.0 and over, adult: Secondary | ICD-10-CM | POA: Insufficient documentation

## 2019-09-03 DIAGNOSIS — I10 Essential (primary) hypertension: Secondary | ICD-10-CM | POA: Insufficient documentation

## 2019-09-03 DIAGNOSIS — R7303 Prediabetes: Secondary | ICD-10-CM | POA: Insufficient documentation

## 2019-09-03 DIAGNOSIS — F329 Major depressive disorder, single episode, unspecified: Secondary | ICD-10-CM | POA: Insufficient documentation

## 2019-09-03 DIAGNOSIS — Z7951 Long term (current) use of inhaled steroids: Secondary | ICD-10-CM | POA: Insufficient documentation

## 2019-09-03 DIAGNOSIS — E039 Hypothyroidism, unspecified: Secondary | ICD-10-CM | POA: Insufficient documentation

## 2019-09-03 DIAGNOSIS — Z881 Allergy status to other antibiotic agents status: Secondary | ICD-10-CM | POA: Insufficient documentation

## 2019-09-03 DIAGNOSIS — Z880 Allergy status to penicillin: Secondary | ICD-10-CM | POA: Diagnosis not present

## 2019-09-03 DIAGNOSIS — K760 Fatty (change of) liver, not elsewhere classified: Secondary | ICD-10-CM | POA: Diagnosis not present

## 2019-09-03 DIAGNOSIS — K259 Gastric ulcer, unspecified as acute or chronic, without hemorrhage or perforation: Secondary | ICD-10-CM | POA: Diagnosis not present

## 2019-09-03 DIAGNOSIS — E538 Deficiency of other specified B group vitamins: Secondary | ICD-10-CM | POA: Insufficient documentation

## 2019-09-03 DIAGNOSIS — K2970 Gastritis, unspecified, without bleeding: Secondary | ICD-10-CM | POA: Insufficient documentation

## 2019-09-03 DIAGNOSIS — N393 Stress incontinence (female) (male): Secondary | ICD-10-CM | POA: Insufficient documentation

## 2019-09-03 DIAGNOSIS — G4730 Sleep apnea, unspecified: Secondary | ICD-10-CM | POA: Insufficient documentation

## 2019-09-03 DIAGNOSIS — K769 Liver disease, unspecified: Secondary | ICD-10-CM | POA: Insufficient documentation

## 2019-09-03 DIAGNOSIS — M1990 Unspecified osteoarthritis, unspecified site: Secondary | ICD-10-CM | POA: Diagnosis not present

## 2019-09-03 DIAGNOSIS — F419 Anxiety disorder, unspecified: Secondary | ICD-10-CM | POA: Insufficient documentation

## 2019-09-03 DIAGNOSIS — R519 Headache, unspecified: Secondary | ICD-10-CM | POA: Diagnosis not present

## 2019-09-03 DIAGNOSIS — E559 Vitamin D deficiency, unspecified: Secondary | ICD-10-CM | POA: Insufficient documentation

## 2019-09-03 DIAGNOSIS — Z9049 Acquired absence of other specified parts of digestive tract: Secondary | ICD-10-CM | POA: Insufficient documentation

## 2019-09-03 HISTORY — DX: Deficiency of other specified B group vitamins: E53.8

## 2019-09-03 HISTORY — DX: Herpesviral vulvovaginitis: A60.04

## 2019-09-03 HISTORY — DX: Prediabetes: R73.03

## 2019-09-03 HISTORY — DX: Gastric ulcer, unspecified as acute or chronic, without hemorrhage or perforation: K25.9

## 2019-09-03 HISTORY — DX: Lymphedema, not elsewhere classified: I89.0

## 2019-09-03 HISTORY — PX: ESOPHAGOGASTRODUODENOSCOPY (EGD) WITH PROPOFOL: SHX5813

## 2019-09-03 HISTORY — DX: Personal history of urinary calculi: Z87.442

## 2019-09-03 HISTORY — DX: Sleep apnea, unspecified: G47.30

## 2019-09-03 HISTORY — DX: Stress incontinence (female) (male): N39.3

## 2019-09-03 HISTORY — DX: Hypothyroidism, unspecified: E03.9

## 2019-09-03 HISTORY — DX: Morbid (severe) obesity due to excess calories: E66.01

## 2019-09-03 HISTORY — DX: Vitamin D deficiency, unspecified: E55.9

## 2019-09-03 HISTORY — DX: Fatty (change of) liver, not elsewhere classified: K76.0

## 2019-09-03 SURGERY — ESOPHAGOGASTRODUODENOSCOPY (EGD) WITH PROPOFOL
Anesthesia: General

## 2019-09-03 MED ORDER — PROPOFOL 10 MG/ML IV BOLUS
INTRAVENOUS | Status: DC | PRN
  Administered 2019-09-03: 20 mg via INTRAVENOUS
  Administered 2019-09-03: 80 mg via INTRAVENOUS

## 2019-09-03 MED ORDER — SODIUM CHLORIDE 0.9 % IV SOLN: INTRAVENOUS | Status: DC

## 2019-09-03 MED ORDER — ONDANSETRON HCL 4 MG/2ML IJ SOLN
INTRAMUSCULAR | Status: DC | PRN
  Administered 2019-09-03: 4 mg via INTRAVENOUS

## 2019-09-03 MED ORDER — LIDOCAINE HCL (PF) 2 % IJ SOLN
INTRAMUSCULAR | Status: AC
  Filled 2019-09-03: qty 10

## 2019-09-03 MED ORDER — EPHEDRINE SULFATE 50 MG/ML IJ SOLN
INTRAMUSCULAR | Status: AC
  Filled 2019-09-03: qty 1

## 2019-09-03 MED ORDER — ONDANSETRON HCL 4 MG/2ML IJ SOLN
INTRAMUSCULAR | Status: AC
  Filled 2019-09-03: qty 2

## 2019-09-03 MED ORDER — PROPOFOL 500 MG/50ML IV EMUL
INTRAVENOUS | Status: AC
  Filled 2019-09-03: qty 100

## 2019-09-03 MED ORDER — PROPOFOL 500 MG/50ML IV EMUL
INTRAVENOUS | Status: AC
  Filled 2019-09-03: qty 150

## 2019-09-03 NOTE — H&P (Signed)
Outpatient short stay form Pre-procedure 09/03/2019 8:29 AM Tammy Young K. Alice Reichert, M.D.  Primary Physician: Toni Arthurs, NP  Reason for visit:  Epigastric pain, Dysphagia, nausea  History of present illness:  54 y/o patient with hx of epigastric pain, nausea, GERD and intermittent solid food dysphagia. CT Scan showed fatty liver, but no acute changes. No hemetemesis or melena.     Current Facility-Administered Medications:  .  0.9 %  sodium chloride infusion, , Intravenous, Continuous, Viola, Benay Pike, MD, Last Rate: 20 mL/hr at 09/03/19 0741, New Bag at 09/03/19 0741  Facility-Administered Medications Ordered in Other Encounters:  .  gadopentetate dimeglumine (MAGNEVIST) injection 20 mL, 20 mL, Intravenous, Once PRN, Garvin Fila, MD  Medications Prior to Admission  Medication Sig Dispense Refill Last Dose  . albuterol (PROVENTIL) (2.5 MG/3ML) 0.083% nebulizer solution Inhale into the lungs.   Past Week at Unknown time  . baclofen (LIORESAL) 10 MG tablet Take 1 tablet (10 mg total) by mouth 3 (three) times daily as needed. 30 each 0 Past Week at Unknown time  . budesonide-formoterol (SYMBICORT) 160-4.5 MCG/ACT inhaler    Past Week at Unknown time  . butalbital-acetaminophen-caffeine (FIORICET, ESGIC) 50-325-40 MG tablet Take 1-2 tablets by mouth every 6 (six) hours as needed for headache. Do not exceed 6 tablets in 24 hours. 20 tablet 0 Past Week at Unknown time  . Cholecalciferol 50 MCG (2000 UT) CAPS Take 3 capsules daily for 3 months, then reduce to 1 capsule daily thereafter for Vitamin D Deficiency.   09/02/2019 at 0800  . Cyanocobalamin (VITAMIN B 12 PO) Take 5,000 mg by mouth.   09/02/2019 at 0800  . ergocalciferol (VITAMIN D2) 1.25 MG (50000 UT) capsule Take by mouth.   09/02/2019 at Unknown time  . fexofenadine (ALLEGRA) 180 MG tablet Take 180 mg by mouth daily.   Past Week at Unknown time  . hydrochlorothiazide (HYDRODIURIL) 25 MG tablet Take 25 mg by mouth daily.   09/02/2019 at  0800  . linaclotide (LINZESS) 72 MCG capsule Take 72 mcg by mouth daily before breakfast.   09/02/2019 at 0800  . losartan (COZAAR) 100 MG tablet Take 100 mg by mouth daily.   09/02/2019 at 0800  . meclizine (ANTIVERT) 25 MG tablet Take 25 mg by mouth 3 (three) times daily as needed for dizziness.   Past Week at Unknown time  . mirabegron ER (MYRBETRIQ) 25 MG TB24 tablet Take 25 mg by mouth daily.   09/02/2019 at 0800  . ondansetron (ZOFRAN-ODT) 4 MG disintegrating tablet Take 4 mg by mouth every 8 (eight) hours as needed for nausea or vomiting.   Past Week at Unknown time  . pantoprazole (PROTONIX) 40 MG tablet Take 40 mg by mouth daily.   09/02/2019 at 0800  . rosuvastatin (CRESTOR) 10 MG tablet Take 10 mg by mouth daily.   09/02/2019 at 0800  . SUMAtriptan (IMITREX) 100 MG tablet TK 1 T PO ONCE PRF MIGRAINE. MAY TAKE 2ND DOSE AFTER 2 HOURS IF STILL NEEDED   Past Month at Unknown time  . valACYclovir (VALTREX) 500 MG tablet TAKE ONE CAPLET BY MOUTH TWICE DAILY   Past Week at Unknown time  . DULoxetine (CYMBALTA) 30 MG capsule Take 1 capsule by mouth daily.     Marland Kitchen levothyroxine (SYNTHROID, LEVOTHROID) 25 MCG tablet Take 25 mcg by mouth daily before breakfast.   Not Taking at Unknown time  . methocarbamol (ROBAXIN) 750 MG tablet Take 750 mg by mouth 4 (four) times daily.  Completed Course at Unknown time  . RABEprazole (ACIPHEX) 20 MG tablet Take 20 mg by mouth daily.   Completed Course at Unknown time  . ranitidine (ZANTAC) 150 MG tablet Take 150 mg by mouth at bedtime.   Completed Course at Unknown time  . venlafaxine XR (EFFEXOR-XR) 75 MG 24 hr capsule Take 75 mg by mouth daily with breakfast.   Not Taking at Unknown time     Allergies  Allergen Reactions  . Amoxicillin Rash  . Augmentin [Amoxicillin-Pot Clavulanate] Itching and Rash     Past Medical History:  Diagnosis Date  . Anxiety and depression   . Arthritis   . Fatty liver disease, nonalcoholic   . GERD (gastroesophageal reflux  disease)   . Headache   . Herpes simplex vulvovaginitis   . History of kidney stones   . History of stomach ulcers   . HLD (hyperlipidemia)   . Hypertension   . Hypothyroid   . Hypothyroidism   . Liver disease   . Lymphedema of right lower extremity   . Morbid obesity ([place])   . Multiple gastric ulcers   . PONV (postoperative nausea and vomiting)   . Pre-diabetes   . 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  . Seizures ([place])    only had with preeclampsia during pregnancy at age 45. none since  . Sleep apnea   . Stress incontinence (female) (female)   . Vitamin B 12 deficiency   . Vitamin D deficiency     Review of systems:  Otherwise negative.    Physical Exam  Gen: Alert, oriented. Appears stated age.  HEENT: [place]/AT. PERRLA. Lungs: CTA, no wheezes. CV: RR nl S1, S2. Abd: soft, benign, no masses. BS+ Ext: No edema. Pulses 2+    Planned procedures: Proceed with EGD.. The patient understands the nature of the planned procedure, indications, risks, alternatives and potential complications including but not limited to bleeding, infection, perforation, damage to internal organs and possible oversedation/side effects from anesthesia. The patient agrees and gives consent to proceed.  Please refer to procedure notes for findings, recommendations and patient disposition/instructions.     Tammy Young K. Alice Reichert, M.D. Gastroenterology 09/03/2019  8:29 AM

## 2019-09-03 NOTE — Anesthesia Preprocedure Evaluation (Signed)
Anesthesia Evaluation  Patient identified by MRN, date of birth, ID band Patient awake    Reviewed: Allergy & Precautions, H&P , NPO status , Patient's Chart, lab work & pertinent test results, reviewed documented beta blocker date and time   History of Anesthesia Complications (+) PONV and history of anesthetic complications  Airway Mallampati: II   Neck ROM: full    Dental  (+) Poor Dentition   Pulmonary neg pulmonary ROS, sleep apnea ,    Pulmonary exam normal        Cardiovascular Exercise Tolerance: Poor hypertension, On Medications negative cardio ROS Normal cardiovascular exam Rhythm:regular Rate:Normal     Neuro/Psych  Headaches, Seizures -,  PSYCHIATRIC DISORDERS Anxiety Depression  Neuromuscular disease negative neurological ROS  negative psych ROS   GI/Hepatic negative GI ROS, Neg liver ROS, PUD, GERD  Medicated,  Endo/Other  negative endocrine ROSHypothyroidism   Renal/GU Renal diseasenegative Renal ROS  negative genitourinary   Musculoskeletal   Abdominal   Peds  Hematology negative hematology ROS (+)   Anesthesia Other Findings Past Medical History: No date: Anxiety and depression No date: Arthritis No date: Fatty liver disease, nonalcoholic No date: GERD (gastroesophageal reflux disease) No date: Headache No date: Herpes simplex vulvovaginitis No date: History of kidney stones No date: History of stomach ulcers No date: HLD (hyperlipidemia) No date: Hypertension No date: Hypothyroid No date: Hypothyroidism No date: Liver disease No date: Lymphedema of right lower extremity No date: Morbid obesity ([place]) No date: Multiple gastric ulcers No date: PONV (postoperative nausea and vomiting) No date: Pre-diabetes No date: Renal disorder     Comment:  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 No date: Seizures (HCC)     Comment:  only had with  preeclampsia during pregnancy at age 33.               none since No date: Sleep apnea No date: Stress incontinence (female) (female) No date: Vitamin B 12 deficiency No date: Vitamin D deficiency Past Surgical History: No date: BREAST SURGERY No date: CARPAL TUNNEL RELEASE; Bilateral No date: CHOLECYSTECTOMY 10/09/2016: CYSTOSCOPY WITH URETEROSCOPY, STONE BASKETRY AND STENT  PLACEMENT     Comment:  Procedure: CYSTOSCOPY WITH URETEROSCOPY, STONE BASKETRY               AND STENT PLACEMENT;  Surgeon: Hollice Espy, MD;                Location: [facility];  Service: Urology;; 04/15/2015: DIRECT LARYNGOSCOPY; Left     Comment:  Procedure: DIRECT LARYNGOSCOPY BIOPSY AND LASER LEFT               TONGUE LESION;  Surgeon: Melissa Montane, MD;  Location: [place];  Service: ENT;  Laterality: Left; No date: TONSILLECTOMY No date: URETER SURGERY 2000: VAGINAL HYSTERECTOMY   Reproductive/Obstetrics negative OB ROS                             Anesthesia Physical Anesthesia Plan  ASA: III  Anesthesia Plan: General   Post-op Pain Management:    Induction:   PONV Risk Score and Plan:   Airway Management Planned:   Additional Equipment:   Intra-op Plan:   Post-operative Plan:   Informed Consent: I have reviewed the patients  History and Physical, chart, labs and discussed the procedure including the risks, benefits and alternatives for the proposed anesthesia with the patient or authorized representative who has indicated his/her understanding and acceptance.     Dental Advisory Given  Plan Discussed with: CRNA  Anesthesia Plan Comments:         Anesthesia Quick Evaluation

## 2019-09-03 NOTE — Anesthesia Postprocedure Evaluation (Signed)
Anesthesia Post Note  Patient: Tammy Young  Procedure(s) Performed: ESOPHAGOGASTRODUODENOSCOPY (EGD) WITH PROPOFOL (N/A )  Patient location during evaluation: PACU Anesthesia Type: General Level of consciousness: awake and alert Pain management: pain level controlled Vital Signs Assessment: post-procedure vital signs reviewed and stable Respiratory status: spontaneous breathing, nonlabored ventilation, respiratory function stable and patient connected to nasal cannula oxygen Cardiovascular status: blood pressure returned to baseline and stable Postop Assessment: no apparent nausea or vomiting Anesthetic complications: no     Last Vitals:  Vitals:   09/03/19 0720 09/03/19 0842  BP: (!) 133/93   Pulse: 94   Resp: 16   Temp: (!) 36.4 C (!) 36.3 C  SpO2: 98%     Last Pain:  Vitals:   09/03/19 0852  TempSrc:   PainSc: 0-No pain                 Molli Barrows

## 2019-09-03 NOTE — Interval H&P Note (Signed)
History and Physical Interval Note:  09/03/2019 8:31 AM  Tammy Young  has presented today for surgery, with the diagnosis of EPIGASTRIC PAIN.  The various methods of treatment have been discussed with the patient and family. After consideration of risks, benefits and other options for treatment, the patient has consented to  Procedure(s): ESOPHAGOGASTRODUODENOSCOPY (EGD) WITH PROPOFOL (N/A) as a surgical intervention.  The patient's history has been reviewed, patient examined, no change in status, stable for surgery.  I have reviewed the patient's chart and labs.  Questions were answered to the patient's satisfaction.     [place], [place]

## 2019-09-03 NOTE — Transfer of Care (Signed)
Immediate Anesthesia Transfer of Care Note  Patient: Tammy Young  Procedure(s) Performed: ESOPHAGOGASTRODUODENOSCOPY (EGD) WITH PROPOFOL (N/A )  Patient Location: PACU  Anesthesia Type:General  Level of Consciousness: sedated  Airway & Oxygen Therapy: Patient Spontanous Breathing  Post-op Assessment: Report given to RN and Post -op Vital signs reviewed and stable  Post vital signs: Reviewed and stable  Last Vitals:  Vitals Value Taken Time  BP 115/68 09/03/19 0841  Temp    Pulse 91 09/03/19 0841  Resp 20 09/03/19 0841  SpO2 95 % 09/03/19 0841  Vitals shown include unvalidated device data.  Last Pain:  Vitals:   09/03/19 0720  TempSrc: Temporal  PainSc: 0-No pain         Complications: No apparent anesthesia complications

## 2019-09-03 NOTE — Op Note (Signed)
[facility] Gastroenterology Patient Name: Tammy Young Procedure Date: 09/03/2019 8:32 AM MRN: [id number] Account #: [id number] Date of Birth: 03-06-1966 Admit Type: Outpatient Age: 54 Room: [facility] ENDO ROOM 3 Gender: Female Note Status: Finalized Procedure:             Upper GI endoscopy Indications:           Epigastric abdominal pain, Esophageal reflux, Nausea Providers:             Benay Pike. Alice Reichert MD, MD Referring MD:          Toni Arthurs (Referring MD) Medicines:             Propofol per Anesthesia Complications:         No immediate complications. Procedure:             Pre-Anesthesia Assessment:                        - The risks and benefits of the procedure and the                         sedation options and risks were discussed with the                         patient. All questions were answered and informed                         consent was obtained.                        - Patient identification and proposed procedure were                         verified prior to the procedure by the nurse. The                         procedure was verified in the procedure room.                        - ASA Grade Assessment: II - A patient with mild                         systemic disease.                        - After reviewing the risks and benefits, the patient                         was deemed in satisfactory condition to undergo the                         procedure.                        After obtaining informed consent, the endoscope was                         passed under direct vision. Throughout the procedure,                         the patient's blood  pressure, pulse, and oxygen                         saturations were monitored continuously. The Endoscope                         was introduced through the mouth, and advanced to the                         third part of duodenum. The upper GI endoscopy was   accomplished without difficulty. The patient tolerated                         the procedure well. Findings:      Localized mild inflammation characterized by erosions and erythema was       found in the gastric antrum. Biopsies were taken with a cold forceps for       Helicobacter pylori testing.      The examined duodenum was normal.      The esophagus was normal. Impression:            - No endoscopic esophageal abnormality to explain                         patient's dysphagia. Esophagus not amenable to                         dilation, and not attempted.                        - Gastritis. Biopsied.                        - Normal examined duodenum. Recommendation:        - Patient has a contact number available for                         emergencies. The signs and symptoms of potential                         delayed complications were discussed with the patient.                         Return to normal activities tomorrow. Written                         discharge instructions were provided to the patient.                        - Resume previous diet.                        - Continue present medications.                        - Await pathology results.                        - Return to nurse practitioner in 6 weeks.                        -  Follow up with Stephens November, NP in 1.5 months.                        - The findings and recommendations were discussed with                         the patient. Procedure Code(s):     --- Professional ---                        [phone], Esophagogastroduodenoscopy, flexible,                         transoral; with biopsy, single or multiple Diagnosis Code(s):     --- Professional ---                        R11.0, Nausea                        K21.9, Gastro-esophageal reflux disease without                         esophagitis                        R10.13, Epigastric pain                        K29.70, Gastritis, unspecified, without  bleeding                        R13.10, Dysphagia, unspecified CPT copyright 2019 American Medical Association. All rights reserved. The codes documented in this report are preliminary and upon coder review may  be revised to meet current compliance requirements. Efrain Sella MD, MD 09/03/2019 8:44:29 AM This report has been signed electronically. Number of Addenda: 0 Note Initiated On: 09/03/2019 8:32 AM Estimated Blood Loss:  Estimated blood loss: none.      [facility]

## 2019-09-04 ENCOUNTER — Encounter: Payer: Self-pay | Admitting: *Deleted

## 2019-09-04 LAB — SURGICAL PATHOLOGY

## 2019-09-04 NOTE — Anesthesia Postprocedure Evaluation (Signed)
Anesthesia Post Note  Patient: Tammy Young  Procedure(s) Performed: ESOPHAGOGASTRODUODENOSCOPY (EGD) WITH PROPOFOL (N/A )  Patient location during evaluation: PACU Anesthesia Type: General Level of consciousness: awake and alert Pain management: pain level controlled Vital Signs Assessment: post-procedure vital signs reviewed and stable Respiratory status: spontaneous breathing, nonlabored ventilation, respiratory function stable and patient connected to nasal cannula oxygen Cardiovascular status: blood pressure returned to baseline and stable Postop Assessment: no apparent nausea or vomiting Anesthetic complications: no     Last Vitals:  Vitals:   09/03/19 0902 09/03/19 0912  BP: 115/90 129/79  Pulse:    Resp:    Temp:    SpO2:      Last Pain:  Vitals:   09/04/19 0740  TempSrc:   PainSc: 0-No pain                 Molli Barrows

## 2019-09-07 ENCOUNTER — Inpatient Hospital Stay
Admission: RE | Admit: 2019-09-07 | Discharge: 2019-09-07 | Disposition: A | Discharge status: Home / Self Care | Payer: Self-pay | Source: Ambulatory Visit | Attending: *Deleted | Admitting: *Deleted

## 2019-09-07 ENCOUNTER — Other Ambulatory Visit: Payer: Self-pay | Admitting: *Deleted

## 2019-09-07 DIAGNOSIS — Z1231 Encounter for screening mammogram for malignant neoplasm of breast: Secondary | ICD-10-CM

## 2019-09-09 ENCOUNTER — Other Ambulatory Visit: Payer: Self-pay | Admitting: Gerontology

## 2019-09-09 DIAGNOSIS — N644 Mastodynia: Secondary | ICD-10-CM

## 2019-09-09 DIAGNOSIS — Z1239 Encounter for other screening for malignant neoplasm of breast: Secondary | ICD-10-CM

## 2019-09-11 ENCOUNTER — Other Ambulatory Visit: Payer: Self-pay

## 2019-09-11 ENCOUNTER — Ambulatory Visit (INDEPENDENT_AMBULATORY_CARE_PROVIDER_SITE_OTHER): Payer: 59

## 2019-09-11 ENCOUNTER — Ambulatory Visit (INDEPENDENT_AMBULATORY_CARE_PROVIDER_SITE_OTHER): Payer: 59 | Admitting: Nurse Practitioner

## 2019-09-11 ENCOUNTER — Encounter (INDEPENDENT_AMBULATORY_CARE_PROVIDER_SITE_OTHER): Payer: Self-pay | Admitting: Nurse Practitioner

## 2019-09-11 VITALS — BP 145/83 | HR 89 | Resp 16 | Wt 256.8 lb

## 2019-09-11 DIAGNOSIS — I83893 Varicose veins of bilateral lower extremities with other complications: Secondary | ICD-10-CM

## 2019-09-11 DIAGNOSIS — M7989 Other specified soft tissue disorders: Secondary | ICD-10-CM | POA: Diagnosis not present

## 2019-09-11 DIAGNOSIS — M79605 Pain in left leg: Secondary | ICD-10-CM

## 2019-09-11 DIAGNOSIS — M79604 Pain in right leg: Secondary | ICD-10-CM

## 2019-09-11 DIAGNOSIS — R600 Localized edema: Secondary | ICD-10-CM

## 2019-09-11 DIAGNOSIS — L282 Other prurigo: Secondary | ICD-10-CM | POA: Insufficient documentation

## 2019-09-14 ENCOUNTER — Encounter (INDEPENDENT_AMBULATORY_CARE_PROVIDER_SITE_OTHER): Payer: Self-pay | Admitting: Nurse Practitioner

## 2019-09-14 NOTE — Progress Notes (Signed)
SUBJECTIVE:  Patient ID: Tammy Young, female    DOB: [birth year], 54 y.o.   MRN: [id number] Chief Complaint  Patient presents with  . Follow-up    ultrasound follow up    HPI  Tammy Young is a 54 y.o. female that presents today for follow-up evaluation of pain in her lower extremities.  She also has swelling as well.  The right leg is much more affected than the left.  The right leg also has the vast majority of the pain.  The patient reports feeling as if she has numbness in her right lower extremity majority of the time that feels like it is asleep.  Sometimes she has episodes where it feels that she has to drag it behind her.  She does notice pain with palpation near the right calf as well.  She denies any fever, chills, nausea, vomiting or diarrhea.  Denies any previous ulcerations or infections.  She has been to a neurosurgeon and has had cortisone shots and nothing has ever really helped the pain.  As far as her lower extremity edema she elevates her legs which helps somewhat however walking and activity significantly make the pain and swelling worse.  Today the patient underwent noninvasive studies.  The patient's right lower extremity has an ABI 1.24 and a left of 1.16.  The patient has strong triphasic waveforms in the bilateral tibial arteries as well as in the bilateral toes.  The patient's TBI on the right was 0.93 on the left 0.87.  The patient also underwent a bilateral venous reflux study.  The left lower extremity has evidence of reflux in the left common femoral vein.  There is no evidence of DVT or superficial venous thrombosis of the left lower extremity.  Right lower extremity has no evidence of DVT or superficial venous thrombosis.  The patient does have evidence of reflux in the great saphenous vein at the proximal thigh.  Past Medical History:  Diagnosis Date  . Anxiety and depression   . Arthritis   . Fatty liver disease, nonalcoholic   . GERD (gastroesophageal  reflux disease)   . Headache   . Herpes simplex vulvovaginitis   . History of kidney stones   . History of stomach ulcers   . HLD (hyperlipidemia)   . Hypertension   . Hypothyroid   . Hypothyroidism   . Liver disease   . Lymphedema of right lower extremity   . Morbid obesity ([place])   . Multiple gastric ulcers   . PONV (postoperative nausea and vomiting)   . Pre-diabetes   . 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  . Seizures ([place])    only had with preeclampsia during pregnancy at age 19. none since  . Sleep apnea   . Stress incontinence (female) (female)   . Vitamin B 12 deficiency   . Vitamin D deficiency     Past Surgical History:  Procedure Laterality Date  . BREAST SURGERY     reduction  . CARPAL TUNNEL RELEASE Bilateral   . CHOLECYSTECTOMY    . CYSTOSCOPY WITH URETEROSCOPY, STONE BASKETRY AND STENT PLACEMENT  10/09/2016   Procedure: CYSTOSCOPY WITH URETEROSCOPY, STONE BASKETRY AND STENT PLACEMENT;  Surgeon: Hollice Espy, MD;  Location: [facility] OR[facility];  Service: Urology;;  . DIRECT LARYNGOSCOPY Left 04/15/2015   Procedure: DIRECT LARYNGOSCOPY BIOPSY AND LASER LEFT TONGUE LESION;  Surgeon: Melissa Montane, MD;  Location: [place];  Service: ENT;  Laterality: Left;  . ESOPHAGOGASTRODUODENOSCOPY (EGD) WITH PROPOFOL N/A 09/03/2019   Procedure: ESOPHAGOGASTRODUODENOSCOPY (EGD) WITH PROPOFOL;  Surgeon: [place], Benay Pike, MD;  Location: [facility] ENDOS[facility];  Service: Gastroenterology;  Laterality: N/A;  . TONSILLECTOMY    . URETER SURGERY    . VAGINAL HYSTERECTOMY  2000    Social History   Socioeconomic History  . Marital status: Single    Spouse name: Not on file  . Number of children: 1  . Years of education: GED  . Highest education level: Not on file  Occupational History  . Occupation: Teacher, music  Tobacco Use  . Smoking status: Passive Smoke Exposure - Never Smoker  . Smokeless tobacco: Never Used  .  Tobacco comment: mother smokes outside  Substance and Sexual Activity  . Alcohol use: Not Currently    Comment: rarely  . Drug use: No  . Sexual activity: Yes  Other Topics Concern  . Not on file  Social History Narrative   Lives alone   Caffeine use: Drink 1 glass tea/day   Social Determinants of Health   Financial Resource Strain:   . Difficulty of Paying Living Expenses: Not on file  Food Insecurity:   . Worried About Charity fundraiser in the Last Year: Not on file  . Ran Out of Food in the Last Year: Not on file  Transportation Needs:   . Lack of Transportation (Medical): Not on file  . Lack of Transportation (Non-Medical): Not on file  Physical Activity:   . Days of Exercise per Week: Not on file  . Minutes of Exercise per Session: Not on file  Stress:   . Feeling of Stress : Not on file  Social Connections:   . Frequency of Communication with Friends and Family: Not on file  . Frequency of Social Gatherings with Friends and Family: Not on file  . Attends Religious Services: Not on file  . Active Member of Clubs or Organizations: Not on file  . Attends Archivist Meetings: Not on file  . Marital Status: Not on file  Intimate Partner Violence:   . Fear of Current or Ex-Partner: Not on file  . Emotionally Abused: Not on file  . Physically Abused: Not on file  . Sexually Abused: Not on file    Family History  Problem Relation Age of Onset  . Kidney Stones Mother   . Diabetes Mother   . Kidney Stones Father   . Migraines Maternal Grandmother   . Stroke Maternal Grandmother   . Diabetes Maternal Grandmother   . Prostate cancer Maternal Grandfather   . Kidney cancer Neg Hx   . Bladder Cancer Neg Hx     Allergies  Allergen Reactions  . Amoxicillin Rash  . Augmentin [Amoxicillin-Pot Clavulanate] Itching and Rash     Review of Systems   Review of Systems: Negative Unless Checked Constitutional: [] Weight loss  [] Fever  [] Chills Cardiac: [] Chest  pain   []  Atrial Fibrillation  [] Palpitations   [] Shortness of breath when laying flat   [] Shortness of breath with exertion. [] Shortness of breath at rest Vascular:  [x] Pain in legs with walking   [] Pain in legs with standing [] Pain in legs when laying flat   [] Claudication    [] Pain in feet when laying flat    [] History of DVT   [] Phlebitis   [x] Swelling in legs   [] Varicose veins   [] Non-healing ulcers Pulmonary:   [] Uses home oxygen   [] Productive cough   [] Hemoptysis   []   Wheeze  [] COPD   [] Asthma Neurologic:  [] Dizziness   [x] Seizures  [] Blackouts [] History of stroke   [] History of TIA  [] Aphasia   [] Temporary Blindness   [] Weakness or numbness in arm   [x] Weakness or numbness in leg Musculoskeletal:   [] Joint swelling   [] Joint pain   [] Low back pain  []  History of Knee Replacement [x] Arthritis [] back Surgeries  []  Spinal Stenosis    Hematologic:  [] Easy bruising  [] Easy bleeding   [] Hypercoagulable state   [] Anemic Gastrointestinal:  [] Diarrhea   [] Vomiting  [] Gastroesophageal reflux/heartburn   [] Difficulty swallowing. [] Abdominal pain Genitourinary:  [] Chronic kidney disease   [] Difficult urination  [] Anuric   [] Blood in urine [] Frequent urination  [] Burning with urination   [] Hematuria Skin:  [] Rashes   [] Ulcers [] Wounds Psychological:  [x] History of anxiety   []  History of major depression  []  Memory Difficulties      OBJECTIVE:   Physical Exam  BP (!) 145/83 (BP Location: Right Arm)   Pulse 89   Resp 16   Wt 256 lb 12.8 oz (116.5 kg)   BMI 43.40 kg/m   Gen: WD/WN, NAD Head: /AT, No temporalis wasting.  Ear/Nose/Throat: Hearing grossly intact, nares w/o erythema or drainage Eyes: PER, EOMI, sclera nonicteric.  Neck: Supple, no masses.  No JVD.  Pulmonary:  Good air movement, no use of accessory muscles.  Cardiac: RRR Vascular: 2+ right lower extremity edema, 1+ left  Vessel Right Left  Dorsalis Pedis Palpable Palpable  Posterior Tibial Palpable Palpable    Gastrointestinal: soft, non-distended. No guarding/no peritoneal signs.  Musculoskeletal: M/S 5/5 throughout.  No deformity or atrophy.  Neurologic: Pain and light touch intact in extremities.  Symmetrical.  Speech is fluent. Motor exam as listed above. Psychiatric: Judgment intact, Mood & affect appropriate for pt's clinical situation. Dermatologic: No Venous rashes. No Ulcers Noted.  No changes consistent with cellulitis. Lymph : No Cervical lymphadenopathy, no lichenification or skin changes of chronic lymphedema.       ASSESSMENT AND PLAN:  1. Pain in both lower extremities Based on the patient's description of pain and numbness, we cannot definitively rule out any arterial causes for this.  The patient does have some reflex present in her right leg however this would not present the amount of pain, numbness and discomfort that the patient is describing.  It sounds strongly related more so to sciatic nerve issues.  The patient has recently seen a neurosurgeon and I offered to place a referral to a different neurosurgeon for second opinion however at this time she wishes to discuss with her PCP.  Patient will continue to have work-up done via PCP for pain in her lower extremity.  2. Swelling of limb Discussed conservative therapy tactics with the patient.  This includes wearing medical grade 1 compression stockings elevation and exercise.  Patient will continue to follow conservative therapy as outlined below.  3. Varicose veins of bilateral lower extremities with other complications Some of the patient's pain in her calf would be a result of her venous reflux.  However, I could not say with any certainty that undergoing an endovenous laser ablation with definitively fix the pain or stop it from occurring.  I discussed with the patient about doing conservative therapy for 3 months consistently compromised of wearing medical grade 1 compression stockings, exercise and elevation of her lower  extremity.  We will have the patient return in 3 months to determine progress with conservative therapy to see if this helps her symptoms.  If it does we will discuss proceeding with an endovenous ablation however if it does not we will discuss other possible issues such as utilization of a lymph pump as well.   Current Outpatient Medications on File Prior to Visit  Medication Sig Dispense Refill  . albuterol (PROVENTIL) (2.5 MG/3ML) 0.083% nebulizer solution Inhale into the lungs.    . baclofen (LIORESAL) 10 MG tablet Take 1 tablet (10 mg total) by mouth 3 (three) times daily as needed. 30 each 0  . budesonide-formoterol (SYMBICORT) 160-4.5 MCG/ACT inhaler     . butalbital-acetaminophen-caffeine (FIORICET, ESGIC) 50-325-40 MG tablet Take 1-2 tablets by mouth every 6 (six) hours as needed for headache. Do not exceed 6 tablets in 24 hours. 20 tablet 0  . Cholecalciferol 50 MCG (2000 UT) CAPS Take 3 capsules daily for 3 months, then reduce to 1 capsule daily thereafter for Vitamin D Deficiency.    . Cyanocobalamin (VITAMIN B 12 PO) Take 5,000 mg by mouth.    . ergocalciferol (VITAMIN D2) 1.25 MG (50000 UT) capsule Take by mouth.    . fexofenadine (ALLEGRA) 180 MG tablet Take 180 mg by mouth daily.    . hydrochlorothiazide (HYDRODIURIL) 25 MG tablet Take 25 mg by mouth daily.    Marland Kitchen levothyroxine (SYNTHROID, LEVOTHROID) 25 MCG tablet Take 25 mcg by mouth daily before breakfast.    . linaclotide (LINZESS) 72 MCG capsule Take 72 mcg by mouth daily before breakfast.    . losartan (COZAAR) 100 MG tablet Take 100 mg by mouth daily.    . meclizine (ANTIVERT) 25 MG tablet Take 25 mg by mouth 3 (three) times daily as needed for dizziness.    . methocarbamol (ROBAXIN) 750 MG tablet Take 750 mg by mouth 4 (four) times daily.    . mirabegron ER (MYRBETRIQ) 25 MG TB24 tablet Take 25 mg by mouth daily.    . ondansetron (ZOFRAN-ODT) 4 MG disintegrating tablet Take 4 mg by mouth every 8 (eight) hours as needed for  nausea or vomiting.    . pantoprazole (PROTONIX) 40 MG tablet Take 40 mg by mouth daily.    . RABEprazole (ACIPHEX) 20 MG tablet Take 20 mg by mouth daily.    . ranitidine (ZANTAC) 150 MG tablet Take 150 mg by mouth at bedtime.    . rosuvastatin (CRESTOR) 10 MG tablet Take 10 mg by mouth daily.    . SUMAtriptan (IMITREX) 100 MG tablet TK 1 T PO ONCE PRF MIGRAINE. MAY TAKE 2ND DOSE AFTER 2 HOURS IF STILL NEEDED    . valACYclovir (VALTREX) 500 MG tablet TAKE ONE CAPLET BY MOUTH TWICE DAILY    . venlafaxine XR (EFFEXOR-XR) 75 MG 24 hr capsule Take 75 mg by mouth daily with breakfast.    . DULoxetine (CYMBALTA) 30 MG capsule Take 1 capsule by mouth daily.     Current Facility-Administered Medications on File Prior to Visit  Medication Dose Route Frequency Provider Last Rate Last Admin  . gadopentetate dimeglumine (MAGNEVIST) injection 20 mL  20 mL Intravenous Once PRN Garvin Fila, MD        There are no Patient Instructions on file for this visit. No follow-ups on file.   Kris Hartmann, NP  This note was completed with Sales executive.  Any errors are purely unintentional.

## 2019-09-18 ENCOUNTER — Ambulatory Visit
Admission: RE | Admit: 2019-09-18 | Discharge: 2019-09-18 | Disposition: A | Discharge status: Home / Self Care | Payer: 59 | Source: Ambulatory Visit | Attending: Gerontology | Admitting: Gerontology

## 2019-09-18 DIAGNOSIS — Z1239 Encounter for other screening for malignant neoplasm of breast: Secondary | ICD-10-CM | POA: Diagnosis present

## 2019-09-18 DIAGNOSIS — N644 Mastodynia: Secondary | ICD-10-CM | POA: Diagnosis present

## 2019-09-23 ENCOUNTER — Other Ambulatory Visit: Payer: Self-pay | Admitting: Neurosurgery

## 2019-09-23 DIAGNOSIS — M5489 Other dorsalgia: Secondary | ICD-10-CM

## 2019-09-23 DIAGNOSIS — M4807 Spinal stenosis, lumbosacral region: Secondary | ICD-10-CM

## 2019-10-05 ENCOUNTER — Ambulatory Visit
Admission: RE | Admit: 2019-10-05 | Discharge: 2019-10-05 | Disposition: A | Discharge status: Home / Self Care | Payer: 59 | Source: Ambulatory Visit | Attending: Neurosurgery | Admitting: Neurosurgery

## 2019-10-05 ENCOUNTER — Other Ambulatory Visit: Payer: Self-pay

## 2019-10-05 DIAGNOSIS — M4807 Spinal stenosis, lumbosacral region: Secondary | ICD-10-CM | POA: Diagnosis not present

## 2019-10-05 DIAGNOSIS — M5489 Other dorsalgia: Secondary | ICD-10-CM | POA: Insufficient documentation

## 2019-10-06 DIAGNOSIS — W57XXXA Bitten or stung by nonvenomous insect and other nonvenomous arthropods, initial encounter: Secondary | ICD-10-CM | POA: Insufficient documentation

## 2019-10-06 DIAGNOSIS — S50362A Insect bite (nonvenomous) of left elbow, initial encounter: Secondary | ICD-10-CM | POA: Insufficient documentation

## 2019-10-14 ENCOUNTER — Telehealth (INDEPENDENT_AMBULATORY_CARE_PROVIDER_SITE_OTHER): Payer: Self-pay | Admitting: Vascular Surgery

## 2019-10-14 NOTE — Telephone Encounter (Signed)
Based on the discussion we had had with the patient in February the pain is not related to her arteries, but more likely her back.  We discussed that she would need to go see her primary doctor to find the cause of the worsening pain.  We planned to see the patient back for her leg swelling but that would not affect her ability to walk.  There is no surgery that we can do, as a vascular surgery practice, to help her pain.  If her ultimate goal is to have some discover the cause of her pain she needs to call her PCP.  Nothing that was seen in her studies would affect her ability to walk. We can see her sooner however the schedule allows on Dr. Bunnie Domino schedule.  If there is nothing sooner, she should call her primary doctor for evaluation.

## 2019-10-14 NOTE — Telephone Encounter (Signed)
I spoke with patient and she informed that today her neurosurgeon told her the previous bulging disc from 1 1/2year ago fix itself and the pain is not related to her back pain. I made the patient aware with medical advice and she verbalized understanding.

## 2019-10-21 ENCOUNTER — Encounter: Payer: Self-pay | Admitting: Student in an Organized Health Care Education/Training Program

## 2019-10-21 ENCOUNTER — Telehealth: Payer: Self-pay
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
## 2019-10-21 NOTE — Telephone Encounter (Signed)
LM for patient to call office  For New patient questions.

## 2019-10-22 ENCOUNTER — Telehealth: Payer: Self-pay | Admitting: *Deleted

## 2019-10-22 ENCOUNTER — Ambulatory Visit
Payer: 59 | Attending: Student in an Organized Health Care Education/Training Program | Admitting: Student in an Organized Health Care Education/Training Program

## 2019-10-22 ENCOUNTER — Ambulatory Visit
Admission: RE | Admit: 2019-10-22 | Discharge: 2019-10-22 | Disposition: A | Discharge status: Home / Self Care | Payer: 59 | Source: Ambulatory Visit | Attending: Student in an Organized Health Care Education/Training Program | Admitting: Student in an Organized Health Care Education/Training Program

## 2019-10-22 ENCOUNTER — Encounter: Payer: Self-pay | Admitting: Student in an Organized Health Care Education/Training Program

## 2019-10-22 ENCOUNTER — Other Ambulatory Visit: Payer: Self-pay

## 2019-10-22 VITALS — Ht 64.0 in | Wt 250.0 lb

## 2019-10-22 DIAGNOSIS — G8929 Other chronic pain: Secondary | ICD-10-CM | POA: Insufficient documentation

## 2019-10-22 DIAGNOSIS — M545 Low back pain, unspecified: Secondary | ICD-10-CM | POA: Insufficient documentation

## 2019-10-22 DIAGNOSIS — M25551 Pain in right hip: Secondary | ICD-10-CM | POA: Diagnosis not present

## 2019-10-22 DIAGNOSIS — Z6841 Body Mass Index (BMI) 40.0 and over, adult: Secondary | ICD-10-CM

## 2019-10-22 DIAGNOSIS — M533 Sacrococcygeal disorders, not elsewhere classified: Secondary | ICD-10-CM | POA: Insufficient documentation

## 2019-10-22 DIAGNOSIS — G894 Chronic pain syndrome: Secondary | ICD-10-CM | POA: Diagnosis not present

## 2019-10-22 DIAGNOSIS — M25552 Pain in left hip: Secondary | ICD-10-CM

## 2019-10-22 DIAGNOSIS — M461 Sacroiliitis, not elsewhere classified: Secondary | ICD-10-CM | POA: Insufficient documentation

## 2019-10-26 ENCOUNTER — Telehealth: Payer: Self-pay | Admitting: Student in an Organized Health Care Education/Training Program

## 2019-10-26 NOTE — Telephone Encounter (Signed)
Xray impressions read to patient.  Informed patient that details will be given by MD at next appointment.

## 2019-10-26 NOTE — Telephone Encounter (Signed)
Patient would like to speak with nurse about her xray results. She is schedule for follow up on 13th

## 2019-10-27 ENCOUNTER — Ambulatory Visit (INDEPENDENT_AMBULATORY_CARE_PROVIDER_SITE_OTHER): Payer: 59 | Admitting: Vascular Surgery

## 2019-10-27 ENCOUNTER — Institutional Professional Consult (permissible substitution): Payer: 59 | Admitting: Internal Medicine

## 2019-10-27 ENCOUNTER — Other Ambulatory Visit: Payer: Self-pay

## 2019-10-27 ENCOUNTER — Encounter (INDEPENDENT_AMBULATORY_CARE_PROVIDER_SITE_OTHER): Payer: Self-pay | Admitting: Vascular Surgery

## 2019-10-27 VITALS — BP 121/76 | HR 72 | Ht 64.0 in | Wt 257.0 lb

## 2019-10-27 DIAGNOSIS — I890 Lymphedema, not elsewhere classified: Secondary | ICD-10-CM | POA: Diagnosis not present

## 2019-10-27 DIAGNOSIS — I10 Essential (primary) hypertension: Secondary | ICD-10-CM

## 2019-10-27 DIAGNOSIS — I83811 Varicose veins of right lower extremities with pain: Secondary | ICD-10-CM

## 2019-10-27 DIAGNOSIS — E785 Hyperlipidemia, unspecified: Secondary | ICD-10-CM | POA: Diagnosis not present

## 2019-10-27 NOTE — Assessment & Plan Note (Signed)
The patient has lower extremity pain that is multifactorial including neuropathic pain from her back, lymphedema, but does have significant venous disease in the right leg which is the more severely affected the 2 legs.  She has done appropriate conservative therapy with compression stockings, elevation, and activity but still has daily and persistent symptoms.  This point, I think proceeding with a right great saphenous vein laser ablation would be prudent.  Risks and benefits of this procedure been discussed and she is agreeable to proceeding.

## 2019-10-27 NOTE — Progress Notes (Signed)
MRN : [id number]  Tammy Young is a 54 y.o. (1965-10-29) female who presents with chief complaint of  Chief Complaint  Patient presents with  . Follow-up    72mos no studies  .  History of Present Illness: Patient returns today in follow up of her lower extremity pain and swelling.  Her right leg continues to be the most severely affected leg with severe pain particularly in the medial knee and calf area.  Previously documented venous reflux studies have shown significant venous reflux in the right great saphenous vein.  She has a lot of neuropathic pain from her back.  This continues to be a problem for her.  She reports no ulceration or infection.  Swelling continues to get worse in both legs.  She says she seems to favor her left leg more because her right leg hurts so bad, and when she does it hurts and swells more.  Current Outpatient Medications  Medication Sig Dispense Refill  . albuterol (PROVENTIL) (2.5 MG/3ML) 0.083% nebulizer solution Inhale into the lungs.    . baclofen (LIORESAL) 10 MG tablet Take 1 tablet (10 mg total) by mouth 3 (three) times daily as needed. 30 each 0  . budesonide-formoterol (SYMBICORT) 160-4.5 MCG/ACT inhaler     . Cholecalciferol 50 MCG (2000 UT) CAPS Take 3 capsules daily for 3 months, then reduce to 1 capsule daily thereafter for Vitamin D Deficiency.    . ergocalciferol (VITAMIN D2) 1.25 MG (50000 UT) capsule Take by mouth.    . fexofenadine (ALLEGRA) 180 MG tablet Take 180 mg by mouth daily.    . hydrochlorothiazide (HYDRODIURIL) 25 MG tablet Take 25 mg by mouth daily.    Marland Kitchen levothyroxine (SYNTHROID, LEVOTHROID) 25 MCG tablet Take 25 mcg by mouth daily before breakfast.    . linaclotide (LINZESS) 72 MCG capsule Take 72 mcg by mouth daily before breakfast.    . losartan (COZAAR) 100 MG tablet Take 100 mg by mouth daily.    . meclizine (ANTIVERT) 25 MG tablet Take 25 mg by mouth 3 (three) times daily as needed for dizziness.    . methocarbamol  (ROBAXIN) 750 MG tablet Take 750 mg by mouth 4 (four) times daily.    . mirabegron ER (MYRBETRIQ) 25 MG TB24 tablet Take 25 mg by mouth daily.    . ondansetron (ZOFRAN-ODT) 4 MG disintegrating tablet Take 4 mg by mouth every 8 (eight) hours as needed for nausea or vomiting.    . pantoprazole (PROTONIX) 40 MG tablet Take 40 mg by mouth daily.    . RABEprazole (ACIPHEX) 20 MG tablet Take 20 mg by mouth daily.    . ranitidine (ZANTAC) 150 MG tablet Take 150 mg by mouth at bedtime.    . rosuvastatin (CRESTOR) 10 MG tablet Take 10 mg by mouth daily.    . SUMAtriptan (IMITREX) 100 MG tablet TK 1 T PO ONCE PRF MIGRAINE. MAY TAKE 2ND DOSE AFTER 2 HOURS IF STILL NEEDED    . valACYclovir (VALTREX) 500 MG tablet TAKE ONE CAPLET BY MOUTH TWICE DAILY    . Cyanocobalamin (VITAMIN B 12 PO) Take 5,000 mg by mouth.    . DULoxetine (CYMBALTA) 30 MG capsule Take 1 capsule by mouth daily.    Marland Kitchen venlafaxine XR (EFFEXOR-XR) 75 MG 24 hr capsule Take 75 mg by mouth daily with breakfast.     No current facility-administered medications for this visit.   Facility-Administered Medications Ordered in Other Visits  Medication Dose Route Frequency Provider Last  Rate Last Admin  . gadopentetate dimeglumine (MAGNEVIST) injection 20 mL  20 mL Intravenous Once PRN Garvin Fila, MD        Past Medical History:  Diagnosis Date  . Anxiety and depression   . Arthritis   . Fatty liver disease, nonalcoholic   . GERD (gastroesophageal reflux disease)   . Headache   . Herpes simplex vulvovaginitis   . History of kidney stones   . History of stomach ulcers   . HLD (hyperlipidemia)   . Hypertension   . Hypothyroid   . Hypothyroidism   . Liver disease   . Lymphedema of right lower extremity   . Morbid obesity ([place])   . Multiple gastric ulcers   . PONV (postoperative nausea and vomiting)   . Pre-diabetes   . 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  . Seizures ([place])     only had with preeclampsia during pregnancy at age 30. none since  . Sleep apnea   . Stress incontinence (female) (female)   . Vitamin B 12 deficiency   . Vitamin D deficiency     Past Surgical History:  Procedure Laterality Date  . BREAST SURGERY     reduction  . CARPAL TUNNEL RELEASE Bilateral   . CHOLECYSTECTOMY    . CYSTOSCOPY WITH URETEROSCOPY, STONE BASKETRY AND STENT PLACEMENT  10/09/2016   Procedure: CYSTOSCOPY WITH URETEROSCOPY, STONE BASKETRY AND STENT PLACEMENT;  Surgeon: Hollice Espy, MD;  Location: [facility];  Service: Urology;;  . DIRECT LARYNGOSCOPY Left 04/15/2015   Procedure: DIRECT LARYNGOSCOPY BIOPSY AND LASER LEFT TONGUE LESION;  Surgeon: Melissa Montane, MD;  Location: [place];  Service: ENT;  Laterality: Left;  . ESOPHAGOGASTRODUODENOSCOPY (EGD) WITH PROPOFOL N/A 09/03/2019   Procedure: ESOPHAGOGASTRODUODENOSCOPY (EGD) WITH PROPOFOL;  Surgeon: Toledo, Benay Pike, MD;  Location: [facility];  Service: Gastroenterology;  Laterality: N/A;  . REDUCTION MAMMAPLASTY    . TONSILLECTOMY    . URETER SURGERY    . VAGINAL HYSTERECTOMY  2000    Social History   Tobacco Use  . Smoking status: Passive Smoke Exposure - Never Smoker  . Smokeless tobacco: Never Used  . Tobacco comment: mother smokes outside/ mother deceased  Substance Use Topics  . Alcohol use: Not Currently    Comment: rarely  . Drug use: No    Family History  Problem Relation Age of Onset  . Kidney Stones Mother   . Diabetes Mother   . COPD Mother   . Kidney Stones Father   . Migraines Maternal Grandmother   . Stroke Maternal Grandmother   . Diabetes Maternal Grandmother   . Prostate cancer Maternal Grandfather   . Kidney cancer Neg Hx   . Bladder Cancer Neg Hx     Allergies  Allergen Reactions  . Amoxicillin Rash  . Augmentin [Amoxicillin-Pot Clavulanate] Itching and Rash     REVIEW OF SYSTEMS (Negative unless checked) Constitutional: [] ?Weight  loss[] ?Fever[] ?Chills Cardiac:[] ?Chest pain[] ? Atrial Fibrillation[] ?Palpitations [] ?Shortness of breath when laying flat [] ?Shortness of breath with exertion. [] ?Shortness of breath at rest Vascular: [x] ?Pain in legs with walking[] ?Pain in legswith standing[] ?Pain in legs when laying flat [] ?Claudication  [] ?Pain in feet when laying flat [] ?History of DVT [] ?Phlebitis [x] ?Swelling in legs [] ?Varicose veins [] ?Non-healing ulcers Pulmonary: [] ?Uses home oxygen [] ?Productive cough[] ?Hemoptysis [] ?Wheeze [] ?COPD [] ?Asthma Neurologic: [] ?Dizziness[x] ?Seizures [] ?Blackouts[] ?History of stroke [] ?History of TIA[] ?Aphasia [] ?Temporary Blindness[] ?Weaknessor numbness in arm [x] ?Weakness or numbnessin leg Musculoskeletal:[] ?Joint swelling [] ?Joint pain [] ?Low back pain  [] ? History  of Knee Replacement [x] ?Arthritis [] ?back Surgeries[] ? Spinal Stenosis  Hematologic:[] ?Easy bruising[] ?Easy bleeding [] ?Hypercoagulable state [] ?Anemic Gastrointestinal:[] ?Diarrhea [] ?Vomiting[] ?Gastroesophageal reflux/heartburn[] ?Difficulty swallowing. [] ?Abdominal pain Genitourinary: [] ?Chronic kidney disease [] ?Difficulturination [] ?Anuric[] ?Blood in urine [] ?Frequenturination [] ?Burning with urination[] ?Hematuria Skin: [] ?Rashes [] ?Ulcers [] ?Wounds Psychological: [x] ?History of anxiety[] ?History of major depression  [] ? Memory Difficulties  Physical Examination  BP 121/76   Pulse 72   Ht 5\' 4"  (1.626 m)   Wt 257 lb (116.6 kg)   BMI 44.11 kg/m  Gen:  WD/WN, NAD Head: [place]/AT, No temporalis wasting. Ear/Nose/Throat: Hearing grossly intact, nares w/o erythema or drainage Eyes: Conjunctiva clear. Sclera non-icteric Neck: Supple.  Trachea midline Pulmonary:  Good air movement, no use of accessory muscles.  Cardiac: RRR, no JVD Vascular:  Vessel Right Left  Radial Palpable Palpable                           PT Palpable Palpable  DP Palpable Palpable   Gastrointestinal: soft, non-tender/non-distended. No guarding/reflex.  Musculoskeletal: M/S 5/5 throughout.  No deformity or atrophy.  1-2+ bilateral lower extremity edema. Neurologic: Sensation grossly intact in extremities.  Symmetrical.  Speech is fluent.  Psychiatric: Judgment intact, Mood & affect appropriate for pt's clinical situation. Dermatologic: No rashes or ulcers noted.  No cellulitis or open wounds.       Labs Recent Results (from the past 2160 hour(s))  Creatinine, serum     Status: None   Collection Time: 08/14/19 11:39 AM  Result Value Ref Range   Creatinine, Ser 0.80 0.44 - 1.00 mg/dL   GFR calc non Af Amer >60 >60 mL/min   GFR calc Af Amer >60 >60 mL/min    Comment: Performed at [facility], [address] [place], [place] [address]  SARS CORONAVIRUS 2 (TAT 6-24 HRS) Nasopharyngeal Nasopharyngeal Swab     Status: None   Collection Time: 09/01/19 12:17 PM   Specimen: Nasopharyngeal Swab  Result Value Ref Range   SARS Coronavirus 2 NEGATIVE NEGATIVE    Comment: (NOTE) SARS-CoV-2 target nucleic acids are NOT DETECTED. The SARS-CoV-2 RNA is generally detectable in upper and lower respiratory specimens during the acute phase of infection. Negative results do not preclude SARS-CoV-2 infection, do not rule out co-infections with other pathogens, and should not be used as the sole basis for treatment or other patient management decisions. Negative results must be combined with clinical observations, patient history, and epidemiological information. The expected result is Negative. Fact Sheet for Patients: SugarRoll.be Fact Sheet for Healthcare Providers: https://www.woods-mathews.com/ This test is not yet approved or cleared by the [place] FDA and  has been authorized for detection and/or diagnosis of SARS-CoV-2 by FDA under an Emergency Use Authorization  (EUA). This EUA will remain  in effect (meaning this test can be used) for the duration of the COVID-19 declaration under Section 56 4(b)(1) of the Act, 21 U.S.C. section 360bbb-3(b)(1), unless the authorization is terminated or revoked sooner. Performed at [facility], [place] [address] [place], [place] [address]   Surgical pathology     Status: None   Collection Time: 09/03/19  8:37 AM  Result Value Ref Range   SURGICAL PATHOLOGY      SURGICAL PATHOLOGY CASE: [id number] PATIENT: Kewana Remund Surgical Pathology Report     Specimen Submitted: A. Stomach, antrum; cbx  Clinical History: Epigastric pain. Gastritis.     DIAGNOSIS: A. STOMACH, ANTRUM; COLD BIOPSY: - MILD REACTIVE GASTROPATHY. - NEGATIVE FOR ACTIVE INFLAMMATION, H. PYLORI, INTESTINAL METAPLASIA, DYSPLASIA, AND  MALIGNANCY.  GROSS DESCRIPTION: A. Labeled: C BX antral of stomach, gastritis, rule out H. pylori Received: In formalin Tissue fragment(s): 1 Size: 0.3 cm Description: Tan soft tissue fragment Entirely submitted in 1 cassette.   Final Diagnosis performed by Bryan Lemma, MD.   Electronically signed 09/04/2019 10:44:02AM The electronic signature indicates that the named Attending Pathologist has evaluated the specimen Technical component performed at [facility], [address], [place], [place] [address] Lab: [phone] Dir: Rush Farmer, MD, MMM  Professional component performed at [facility], [facility], [place], [place], [place] [address] Lab: [phone] Dir: Dellia Nims. Reuel Derby, MD     Radiology DG Si Joints  Result Date: 10/22/2019 CLINICAL DATA:  Sick iliac joint pain. EXAM: BILATERAL SACROILIAC JOINTS - 3+ VIEW COMPARISON:  02/23/2016 FINDINGS: There are mild near symmetric degenerative changes of the sacroiliac joints. This has not significantly progressed since 2017. There is no acute displaced fracture or dislocation. IMPRESSION: Mild degenerative changes  of the bilateral sacroiliac joints. Electronically Signed   By: Constance Holster M.D.   On: 10/22/2019 19:51   MR LUMBAR SPINE WO CONTRAST  Result Date: 10/06/2019 CLINICAL DATA:  Low back pain radiating to the hips and buttocks. Right toe numbness EXAM: MRI LUMBAR SPINE WITHOUT CONTRAST TECHNIQUE: Multiplanar, multisequence MR imaging of the lumbar spine was performed. No intravenous contrast was administered. COMPARISON:  December 23, 2018 FINDINGS: Segmentation:  Normal Alignment:  Normal Vertebrae: Superior endplate Schmorl's node at L1 is unchanged. Otherwise normal vertebrae. Conus medullaris and cauda equina: Conus extends to the L1 level. Conus and cauda equina appear normal. Paraspinal and other soft tissues: Negative. Disc levels: T12-L1: Unchanged small central disc protrusion. There is no spinal canal stenosis. No neural foraminal stenosis. L1-L2: Normal disc space and facet joints. There is no spinal canal stenosis. No neural foraminal stenosis. L2-L3: Normal disc space and facet joints. There is no spinal canal stenosis. No neural foraminal stenosis. L3-L4: Normal disc space and facet joints. There is no spinal canal stenosis. No neural foraminal stenosis. L4-L5: Normal disc space and facet joints. There is no spinal canal stenosis. No neural foraminal stenosis. L5-S1: Involution of previously seen disc extrusion at this level. There is no spinal canal stenosis. No neural foraminal stenosis. Visualized sacrum: Normal. IMPRESSION: 1. Resolution of previously seen L5-S1 disc extrusion with no spinal canal or neural foraminal stenosis. 2. Unchanged small T12-L1 central disc protrusion without associated stenosis. Electronically Signed   By: Ulyses Jarred M.D.   On: 10/06/2019 01:30   DG HIP UNILAT W OR W/O PELVIS 2-3 VIEWS LEFT  Result Date: 10/22/2019 CLINICAL DATA:  Bilateral leg pain, sacroiliac pain, pain with ambulation EXAM: DG HIP (WITH OR WITHOUT PELVIS) 2-3V LEFT; DG HIP (WITH OR WITHOUT  PELVIS) 2-3V RIGHT COMPARISON:  08/14/2019 FINDINGS: Frontal view of the pelvis as well as frontal and frogleg lateral views of both hips are obtained. Sacroiliac joints are grossly unremarkable. Right hip: No fracture, subluxation, or dislocation. Joint space is well preserved. Soft tissues are unremarkable. Left hip: No fracture, subluxation, or dislocation. Joint spaces well preserved. Soft tissues are unremarkable. IMPRESSION: 1. Unremarkable bilateral hips.  No acute bony abnormality. Electronically Signed   By: Randa Ngo M.D.   On: 10/22/2019 19:54   DG HIP UNILAT W OR W/O PELVIS 2-3 VIEWS RIGHT  Result Date: 10/22/2019 CLINICAL DATA:  Bilateral leg pain, sacroiliac pain, pain with ambulation EXAM: DG HIP (WITH OR WITHOUT PELVIS) 2-3V LEFT; DG HIP (WITH OR WITHOUT PELVIS)  2-3V RIGHT COMPARISON:  08/14/2019 FINDINGS: Frontal view of the pelvis as well as frontal and frogleg lateral views of both hips are obtained. Sacroiliac joints are grossly unremarkable. Right hip: No fracture, subluxation, or dislocation. Joint space is well preserved. Soft tissues are unremarkable. Left hip: No fracture, subluxation, or dislocation. Joint spaces well preserved. Soft tissues are unremarkable. IMPRESSION: 1. Unremarkable bilateral hips.  No acute bony abnormality. Electronically Signed   By: Randa Ngo M.D.   On: 10/22/2019 19:54    Assessment/Plan  Essential hypertension blood pressure control important in reducing the progression of atherosclerotic disease. On appropriate oral medications.   Lymphedema of right lower extremity Patient has developed what sounds like lymphedema both lower extremities.  At this point, a lymphedema pump would be an excellent adjuvant therapy to address her symptoms.  She should continue to wear her compression stockings and elevate her legs.  We still have plans to treat her venous disease on the right leg.  She is now having pain and swelling in both legs consistent with  lymphedema type symptoms.  This to be at least stage II lymphedema with swelling that is refractory to compression and elevation.  Hyperlipidemia, unspecified lipid control important in reducing the progression of atherosclerotic disease.    Varicose veins of leg with pain, right The patient has lower extremity pain that is multifactorial including neuropathic pain from her back, lymphedema, but does have significant venous disease in the right leg which is the more severely affected the 2 legs.  She has done appropriate conservative therapy with compression stockings, elevation, and activity but still has daily and persistent symptoms.  This point, I think proceeding with a right great saphenous vein laser ablation would be prudent.  Risks and benefits of this procedure been discussed and she is agreeable to proceeding.    Leotis Pain, MD  10/27/2019 1:13 PM    This note was created with Dragon medical transcription system.  Any errors from dictation are purely unintentional

## 2019-10-27 NOTE — Assessment & Plan Note (Signed)
Patient has developed what sounds like lymphedema both lower extremities.  At this point, a lymphedema pump would be an excellent adjuvant therapy to address her symptoms.  She should continue to wear her compression stockings and elevate her legs.  We still have plans to treat her venous disease on the right leg.  She is now having pain and swelling in both legs consistent with lymphedema type symptoms.  This to be at least stage II lymphedema with swelling that is refractory to compression and elevation.

## 2019-10-27 NOTE — Assessment & Plan Note (Signed)
blood pressure control important in reducing the progression of atherosclerotic disease. On appropriate oral medications.  

## 2019-10-27 NOTE — Assessment & Plan Note (Signed)
lipid control important in reducing the progression of atherosclerotic disease.   

## 2019-10-27 NOTE — Patient Instructions (Signed)

## 2019-11-03 ENCOUNTER — Ambulatory Visit
Payer: 59 | Attending: Student in an Organized Health Care Education/Training Program | Admitting: Student in an Organized Health Care Education/Training Program

## 2019-11-03 ENCOUNTER — Other Ambulatory Visit: Payer: Self-pay

## 2019-11-03 ENCOUNTER — Encounter: Payer: Self-pay | Admitting: Student in an Organized Health Care Education/Training Program

## 2019-11-03 DIAGNOSIS — M47818 Spondylosis without myelopathy or radiculopathy, sacral and sacrococcygeal region: Secondary | ICD-10-CM

## 2019-11-03 DIAGNOSIS — M461 Sacroiliitis, not elsewhere classified: Secondary | ICD-10-CM | POA: Diagnosis not present

## 2019-11-03 DIAGNOSIS — M533 Sacrococcygeal disorders, not elsewhere classified: Secondary | ICD-10-CM | POA: Diagnosis not present

## 2019-11-03 MED ORDER — MAGNESIUM 500 MG PO CAPS: 500.0000 mg | ORAL_CAPSULE | Freq: Two times a day (BID) | ORAL | 5 refills | Status: DC

## 2019-11-03 NOTE — Progress Notes (Signed)
Patient: Tammy Young  Service Category: E/M  Provider: Gillis Santa, MD  DOB: 10/30/1965  DOS: 11/03/2019  Location: Office  MRN: [id number]  Setting: Ambulatory outpatient  Referring Provider: Toni Arthurs, NP  Type: Established Patient  Specialty: Interventional Pain Management  PCP: Toni Arthurs, NP  Location: Home  Delivery: TeleHealth     Virtual Encounter - Pain Management PROVIDER NOTE: Information contained herein reflects review and annotations entered in association with encounter. Interpretation of such information and data should be left to medically-trained personnel. Information provided to patient can be located elsewhere in the medical record under "Patient Instructions". Document created using STT-dictation technology, any transcriptional errors that may result from process are unintentional.    Contact & Pharmacy Preferred: [phone] Home: [phone] (home) Mobile: [phone] (mobile) E-mail: [email or web address] [phone] [address], [address] AT [address] [place] [address] Phone: [phone] Fax: [phone]   Pre-screening  Ms. Peaster offered "in-person" vs "virtual" encounter. She indicated preferring virtual for this encounter.   Reason COVID-19*  Social distancing based on CDC and AMA recommendations.   I contacted Marica Otter on 11/03/2019 via telephone.      I clearly identified myself as Gillis Santa, MD. I verified that I was speaking with the correct person using two identifiers (Name: Tammy Young, and date of birth: [birth year]).  This visit was completed via telephone due to the restrictions of the COVID-19 pandemic. All issues as above were discussed and addressed but no physical exam was performed. If it was felt that the patient should be evaluated in the office, they were directed there. The patient verbally consented to this visit. Patient was unable to  complete an audio/visual visit due to Technical difficulties and/or Lack of internet. Due to the catastrophic nature of the COVID-19 pandemic, this visit was done through audio contact only.  Location of the patient: home address (see Epic for details)  Location of the provider: office  Consent I sought verbal advanced consent from Marica Otter for virtual visit interactions. I informed Ms. Duce of possible security and privacy concerns, risks, and limitations associated with providing "not-in-person" medical evaluation and management services. I also informed Ms. Duplechain of the availability of "in-person" appointments. Finally, I informed her that there would be a charge for the virtual visit and that she could be  personally, fully or partially, financially responsible for it. Ms. Fryberger expressed understanding and agreed to proceed.   Historic Elements   Tammy Young is a 54 y.o. year old, female patient evaluated today after her last contact with our practice on 10/26/2019. Tammy Young  has a past medical history of Anxiety and depression, Arthritis, Fatty liver disease, nonalcoholic, GERD (gastroesophageal reflux disease), Headache, Herpes simplex vulvovaginitis, History of kidney stones, History of stomach ulcers, HLD (hyperlipidemia), Hypertension, Hypothyroid, Hypothyroidism, Liver disease, Lymphedema of right lower extremity, Morbid obesity ([place]), Multiple gastric ulcers, PONV (postoperative nausea and vomiting), Pre-diabetes, Renal disorder, Seizures ([place]), Sleep apnea, Stress incontinence (female) (female), Vitamin B 12 deficiency, and Vitamin D deficiency. She also  has a past surgical history that includes Vaginal hysterectomy (2000); Cholecystectomy; Ureter surgery; Tonsillectomy; Carpal tunnel release (Bilateral); Direct laryngoscopy (Left, 04/15/2015); Cystoscopy with ureteroscopy, stone basketry and stent placement (10/09/2016); Breast surgery; Esophagogastroduodenoscopy (egd) with  propofol (N/A, 09/03/2019); and Reduction mammaplasty. Tammy Young has a current medication list which includes the following prescription(s): albuterol, baclofen, budesonide-formoterol, cholecalciferol, cyanocobalamin,  ergocalciferol, fexofenadine, hydrochlorothiazide, levothyroxine, linaclotide, losartan, meclizine, methocarbamol, mirabegron er, ondansetron, pantoprazole, rabeprazole, ranitidine, rosuvastatin, sumatriptan, valacyclovir, venlafaxine xr, duloxetine, and magnesium, and the following Facility-Administered Medications: gadopentetate dimeglumine. She  reports that she is a non-smoker but has been exposed to tobacco smoke. She has never used smokeless tobacco. She reports previous alcohol use. She reports that she does not use drugs. Tammy Young is allergic to amoxicillin and augmentin [amoxicillin-pot clavulanate].   HPI  Today, she is being contacted for worsening of previously known (established) problem   Discussed patient's hip x-ray and SI joint x-ray results.  Hip joint seems unremarkable and no evidence of any significant arthropathy.  Patient does have mild SI joint arthritis and degeneration present on her x-rays.  She complains of low back and buttock as well as groin pain.  She does have a positive Patrick's test that she was able to perform for me virtually.  We discussed diagnostic sacroiliac joint injection under fluoroscopy.  Risks and benefits reviewed and patient like to proceed.  Patient is also complaining of cramping in her feet.  Recommend patient trial of magnesium as she has tried and failed many muscle relaxers in the past and we would like to avoid any sedative side effects in her case.  Of note, she did see Dr. Lazaro Arms with vascular surgery and tentative plan is for right saphenous nerve laser ablation for her varicose veins.  Laboratory Chemistry Profile   Renal Lab Results  Component Value Date   BUN 14 10/09/2016   CREATININE 0.80 08/14/2019   GFRAA >60 08/14/2019    GFRNONAA >60 08/14/2019     Hepatic Lab Results  Component Value Date   AST 28 10/08/2016   ALT 30 10/08/2016   ALBUMIN 4.4 10/08/2016   ALKPHOS 77 10/08/2016   LIPASE 28 10/08/2016     Electrolytes Lab Results  Component Value Date   NA 138 10/09/2016   K 3.8 10/09/2016   CL 109 10/09/2016   CALCIUM 8.4 (L) 10/09/2016     Bone No results found for: VD25OH, VD125OH2TOT, BT5974BU3, AG5364WO0, 25OHVITD1, 25OHVITD2, 25OHVITD3, TESTOFREE, TESTOSTERONE   Inflammation (CRP: Acute Phase) (ESR: Chronic Phase) No results found for: CRP, ESRSEDRATE, LATICACIDVEN     Note: Above Lab results reviewed.  Imaging  DG HIP UNILAT W OR W/O PELVIS 2-3 VIEWS RIGHT CLINICAL DATA:  Bilateral leg pain, sacroiliac pain, pain with ambulation  EXAM: DG HIP (WITH OR WITHOUT PELVIS) 2-3V LEFT; DG HIP (WITH OR WITHOUT PELVIS) 2-3V RIGHT  COMPARISON:  08/14/2019  FINDINGS: Frontal view of the pelvis as well as frontal and frogleg lateral views of both hips are obtained. Sacroiliac joints are grossly unremarkable.  Right hip: No fracture, subluxation, or dislocation. Joint space is well preserved. Soft tissues are unremarkable.  Left hip: No fracture, subluxation, or dislocation. Joint spaces well preserved. Soft tissues are unremarkable.  IMPRESSION: 1. Unremarkable bilateral hips.  No acute bony abnormality.  Electronically Signed   By: Randa Ngo M.D.   On: 10/22/2019 19:54 DG HIP UNILAT W OR W/O PELVIS 2-3 VIEWS LEFT CLINICAL DATA:  Bilateral leg pain, sacroiliac pain, pain with ambulation  EXAM: DG HIP (WITH OR WITHOUT PELVIS) 2-3V LEFT; DG HIP (WITH OR WITHOUT PELVIS) 2-3V RIGHT  COMPARISON:  08/14/2019  FINDINGS: Frontal view of the pelvis as well as frontal and frogleg lateral views of both hips are obtained. Sacroiliac joints are grossly unremarkable.  Right hip: No fracture, subluxation, or dislocation. Joint space is well preserved. Soft tissues are  unremarkable.  Left hip: No fracture, subluxation, or dislocation. Joint spaces well preserved. Soft tissues are unremarkable.  IMPRESSION: 1. Unremarkable bilateral hips.  No acute bony abnormality.  Electronically Signed   By: Randa Ngo M.D.   On: 10/22/2019 19:54 DG Si Joints CLINICAL DATA:  Sick iliac joint pain.  EXAM: BILATERAL SACROILIAC JOINTS - 3+ VIEW  COMPARISON:  02/23/2016  FINDINGS: There are mild near symmetric degenerative changes of the sacroiliac joints. This has not significantly progressed since 2017. There is no acute displaced fracture or dislocation.  IMPRESSION: Mild degenerative changes of the bilateral sacroiliac joints.  Electronically Signed   By: Constance Holster M.D.   On: 10/22/2019 19:51   Reviewed with patient Assessment  The primary encounter diagnosis was SI (sacroiliac) joint dysfunction. 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  Plan of Care  Ms. Eron KRISTELL WOODING has a current medication list which includes the following long-term medication(s): albuterol, budesonide-formoterol, fexofenadine, hydrochlorothiazide, levothyroxine, linaclotide, mirabegron er, rabeprazole, ranitidine, rosuvastatin, sumatriptan, and duloxetine.  Pharmacotherapy (Medications Ordered): Meds ordered this encounter  Medications  . Magnesium 500 MG CAPS    Sig: Take 1 capsule (500 mg total) by mouth 2 (two) times daily at 8 am and 10 pm.    Dispense:  60 capsule    Refill:  5    Fill one day early if pharmacy is closed on scheduled refill date. May substitute for generic if available. May substitute with similar over-the-counter product.   Orders:  Orders Placed This Encounter  Procedures  . SACROILIAC JOINT INJECTION    Standing Status:   Future    Standing Expiration Date:   12/03/2019    Scheduling Instructions:     Side: Bilateral     Sedation: with      Timeframe: ASAP    Order Specific Question:   Where will this procedure be performed?    Answer:   [facility]   Follow-up plan:   Return in about 2 weeks (around 11/17/2019) for B/L SI-J , with sedation.    Recent Visits Date Type Provider Dept  10/22/19 Office Visit Gillis Santa, MD [facility]  Showing recent visits within past 90 days and meeting all other requirements   Today's Visits Date Type Provider Dept  11/03/19 Office Visit Gillis Santa, MD [facility]  Showing today's visits and meeting all other requirements   Future Appointments No visits were found meeting these conditions.  Showing future appointments within next 90 days and meeting all other requirements   I discussed the assessment and treatment plan with the patient. The patient was provided an opportunity to ask questions and all were answered. The patient agreed with the plan and demonstrated an understanding of the instructions.  Patient advised to call back or seek an in-person evaluation if the symptoms or condition worsens.  Duration of encounter: 25 minutes.  Note by: Gillis Santa, MD Date: 11/03/2019; Time: 1:17 PM

## 2019-11-10 ENCOUNTER — Ambulatory Visit (INDEPENDENT_AMBULATORY_CARE_PROVIDER_SITE_OTHER): Payer: 59 | Admitting: Vascular Surgery

## 2019-11-18 ENCOUNTER — Ambulatory Visit
Admission: RE | Admit: 2019-11-18 | Discharge: 2019-11-18 | Disposition: A | Discharge status: Home / Self Care | Payer: 59 | Source: Ambulatory Visit | Attending: Student in an Organized Health Care Education/Training Program | Admitting: Student in an Organized Health Care Education/Training Program

## 2019-11-18 ENCOUNTER — Other Ambulatory Visit: Payer: Self-pay

## 2019-11-18 ENCOUNTER — Encounter: Payer: Self-pay | Admitting: Student in an Organized Health Care Education/Training Program

## 2019-11-18 ENCOUNTER — Ambulatory Visit (HOSPITAL_BASED_OUTPATIENT_CLINIC_OR_DEPARTMENT_OTHER): Payer: 59 | Admitting: Student in an Organized Health Care Education/Training Program

## 2019-11-18 DIAGNOSIS — M533 Sacrococcygeal disorders, not elsewhere classified: Secondary | ICD-10-CM | POA: Diagnosis not present

## 2019-11-18 DIAGNOSIS — M549 Dorsalgia, unspecified: Secondary | ICD-10-CM | POA: Insufficient documentation

## 2019-11-18 DIAGNOSIS — M461 Sacroiliitis, not elsewhere classified: Secondary | ICD-10-CM | POA: Diagnosis not present

## 2019-11-18 DIAGNOSIS — M47818 Spondylosis without myelopathy or radiculopathy, sacral and sacrococcygeal region: Secondary | ICD-10-CM

## 2019-11-18 MED ORDER — IOHEXOL 180 MG/ML  SOLN
10.0000 mL | Freq: Once | INTRAMUSCULAR | Status: AC
  Administered 2019-11-18: 10 mL via INTRA_ARTICULAR

## 2019-11-18 MED ORDER — METHYLPREDNISOLONE ACETATE 40 MG/ML IJ SUSP
40.0000 mg | Freq: Once | INTRAMUSCULAR | Status: AC
  Administered 2019-11-18: 12:00:00 40 mg via INTRA_ARTICULAR
  Filled 2019-11-18: qty 1

## 2019-11-18 MED ORDER — ROPIVACAINE HCL 2 MG/ML IJ SOLN
INTRAMUSCULAR | Status: AC
  Filled 2019-11-18: qty 10

## 2019-11-18 MED ORDER — FENTANYL CITRATE (PF) 100 MCG/2ML IJ SOLN
25.0000 ug | INTRAMUSCULAR | Status: DC | PRN
  Administered 2019-11-18: 100 ug via INTRAVENOUS

## 2019-11-18 MED ORDER — ROPIVACAINE HCL 2 MG/ML IJ SOLN
4.0000 mL | Freq: Once | INTRAMUSCULAR | Status: AC
  Administered 2019-11-18: 10 mL via INTRA_ARTICULAR
  Filled 2019-11-18: qty 10

## 2019-11-18 MED ORDER — LIDOCAINE HCL 2 % IJ SOLN
INTRAMUSCULAR | Status: AC
  Filled 2019-11-18: qty 20

## 2019-11-18 MED ORDER — ROPIVACAINE HCL 2 MG/ML IJ SOLN
4.0000 mL | Freq: Once | INTRAMUSCULAR | Status: AC
  Administered 2019-11-18: 12:00:00 10 mL via INTRA_ARTICULAR

## 2019-11-18 MED ORDER — METHYLPREDNISOLONE ACETATE 40 MG/ML IJ SUSP
40.0000 mg | Freq: Once | INTRAMUSCULAR | Status: AC
  Administered 2019-11-18: 40 mg via INTRA_ARTICULAR
  Filled 2019-11-18: qty 1

## 2019-11-18 MED ORDER — IOHEXOL 180 MG/ML  SOLN
INTRAMUSCULAR | Status: AC
  Filled 2019-11-18: qty 20

## 2019-11-18 MED ORDER — DEXAMETHASONE SODIUM PHOSPHATE 10 MG/ML IJ SOLN
INTRAMUSCULAR | Status: AC
  Filled 2019-11-18: qty 1

## 2019-11-18 MED ORDER — LIDOCAINE HCL 2 % IJ SOLN
20.0000 mL | Freq: Once | INTRAMUSCULAR | Status: AC
  Administered 2019-11-18: 400 mg

## 2019-11-18 MED ORDER — FENTANYL CITRATE (PF) 100 MCG/2ML IJ SOLN
INTRAMUSCULAR | Status: AC
  Filled 2019-11-18: qty 2

## 2019-11-18 NOTE — Progress Notes (Signed)
PROVIDER NOTE: Information contained herein reflects review and annotations entered in association with encounter. Interpretation of such information and data should be left to medically-trained personnel. Information provided to patient can be located elsewhere in the medical record under "Patient Instructions". Document created using STT-dictation technology, any transcriptional errors that may result from process are unintentional.    Patient: Tammy Young  Service Category: Procedure  Provider: Gillis Santa, MD  DOB: 1965-08-12  DOS: 11/18/2019  Location: [facility]  MRN: [id number]  Setting: Ambulatory - outpatient  Referring Provider: Gillis Santa, MD  Type: Established Patient  Specialty: Interventional Pain Management  PCP: Toni Arthurs, NP   Primary Reason for Visit: Interventional Pain Management Treatment. CC: Back Pain  Procedure:          Anesthesia, Analgesia, Anxiolysis:  Type: Diagnostic Sacroiliac Joint Steroid Injection #1  Region: Inferior Lumbosacral Region Level: PIIS (Posterior Inferior Iliac Spine) Laterality: Bilateral  Type: Moderate (Conscious) Sedation combined with Local Anesthesia Indication(s): Analgesia and Anxiety Route: Intravenous (IV) IV Access: Secured Sedation: Meaningful verbal contact was maintained at all times during the procedure  Local Anesthetic: Lidocaine 1-2%  Position: Prone           Indications: 1. SI (sacroiliac) joint dysfunction   2. SI (sacroiliac) joint inflammation (HCC)   3. 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 (year old), female patient, seen today for interventional treatment. She  has a past surgical history that includes Vaginal hysterectomy (2000); Cholecystectomy; Ureter surgery; Tonsillectomy; Carpal tunnel release (Bilateral); Direct laryngoscopy (Left, 04/15/2015);  Cystoscopy with ureteroscopy, stone basketry and stent placement (10/09/2016); Breast surgery; Esophagogastroduodenoscopy (egd) with propofol (N/A, 09/03/2019); and Reduction mammaplasty. Tammy Young has a current medication list which includes the following prescription(s): albuterol, baclofen, budesonide-formoterol, cholecalciferol, cyanocobalamin, ergocalciferol, fexofenadine, hydrochlorothiazide, levothyroxine, linaclotide, losartan, magnesium, meclizine, methocarbamol, mirabegron er, ondansetron, pantoprazole, rabeprazole, ranitidine, rosuvastatin, sumatriptan, valacyclovir, venlafaxine xr, and duloxetine, and the following Facility-Administered Medications: fentanyl and gadopentetate dimeglumine. Her primarily concern today is the Back Pain  Initial Vital Signs:  Pulse/HCG Rate: 81ECG Heart Rate: 76 Temp: (!) 97.4 F (36.3 C) Resp: 16 BP: 123/84 SpO2: 96 %  BMI: Estimated body mass index is 43.77 kg/m as calculated from the following:   Height as of this encounter: 5\' 4"  (1.626 m).   Weight as of this encounter: 255 lb (115.7 kg).  Risk Assessment: Allergies: Reviewed. She is allergic to amoxicillin and augmentin [amoxicillin-pot clavulanate].  Allergy Precautions: None required Coagulopathies: Reviewed. None identified.  Blood-thinner therapy: None at this time Active Infection(s): Reviewed. None identified. Tammy Young is afebrile  Site Confirmation: Tammy Young was asked to confirm the procedure and laterality before marking the site Procedure checklist: Completed Consent: Before the procedure and under the influence of no sedative(s), amnesic(s), or anxiolytics, the patient was informed of the treatment options, risks and possible complications. To fulfill our ethical and legal obligations, as recommended by the American Medical Association's Code of Ethics, I have informed the patient of my clinical impression; the nature and purpose of the treatment or procedure; the risks, benefits, and  possible complications of the intervention; the alternatives, including doing nothing; the risk(s) and benefit(s) of the alternative treatment(s) or procedure(s); and the risk(s) and benefit(s) of doing nothing. The patient was provided information about the general risks and possible complications associated with the procedure. These may include, but are  not limited to: failure to achieve desired goals, infection, bleeding, organ or nerve damage, allergic reactions, paralysis, and death. In addition, the patient was informed of those risks and complications associated to the procedure, such as failure to decrease pain; infection; bleeding; organ or nerve damage with subsequent damage to sensory, motor, and/or autonomic systems, resulting in permanent pain, numbness, and/or weakness of one or several areas of the body; allergic reactions; (i.e.: anaphylactic reaction); and/or death. Furthermore, the patient was informed of those risks and complications associated with the medications. These include, but are not limited to: allergic reactions (i.e.: anaphylactic or anaphylactoid reaction(s)); adrenal axis suppression; blood sugar elevation that in diabetics may result in ketoacidosis or comma; water retention that in patients with history of congestive heart failure may result in shortness of breath, pulmonary edema, and decompensation with resultant heart failure; weight gain; swelling or edema; medication-induced neural toxicity; particulate matter embolism and blood vessel occlusion with resultant organ, and/or nervous system infarction; and/or aseptic necrosis of one or more joints. Finally, the patient was informed that Medicine is not an exact science; therefore, there is also the possibility of unforeseen or unpredictable risks and/or possible complications that may result in a catastrophic outcome. The patient indicated having understood very clearly. We have given the patient no guarantees and we have  made no promises. Enough time was given to the patient to ask questions, all of which were answered to the patient's satisfaction. Tammy Young has indicated that she wanted to continue with the procedure. Attestation: I, the ordering provider, attest that I have discussed with the patient the benefits, risks, side-effects, alternatives, likelihood of achieving goals, and potential problems during recovery for the procedure that I have provided informed consent. Date  Time: 11/18/2019 11:16 AM  Pre-Procedure Preparation:  Monitoring: As per clinic protocol. Respiration, ETCO2, SpO2, BP, heart rate and rhythm monitor placed and checked for adequate function Safety Precautions: Patient was assessed for positional comfort and pressure points before starting the procedure. Time-out: I initiated and conducted the "Time-out" before starting the procedure, as per protocol. The patient was asked to participate by confirming the accuracy of the "Time Out" information. Verification of the correct person, site, and procedure were performed and confirmed by me, the nursing staff, and the patient. "Time-out" conducted as per Joint Commission's Universal Protocol (UP.01.01.01). Time: [id number]  Description of Procedure:          Target Area: Superior, posterior, aspect of the sacroiliac fissure Approach: Posterior, paraspinal, ipsilateral approach. Area Prepped: Entire Lower Lumbosacral Region DuraPrep (Iodine Povacrylex [0.7% available iodine] and Isopropyl Alcohol, 74% w/w) Safety Precautions: Aspiration looking for blood return was conducted prior to all injections. At no point did we inject any substances, as a needle was being advanced. No attempts were made at seeking any paresthesias. Safe injection practices and needle disposal techniques used. Medications properly checked for expiration dates. SDV (single dose vial) medications used. Description of the Procedure: Protocol guidelines were followed. The patient was  placed in position over the procedure table. The target area was identified and the area prepped in the usual manner. Skin & deeper tissues infiltrated with local anesthetic. Appropriate amount of time allowed to pass for local anesthetics to take effect. The procedure needle was advanced under fluoroscopic guidance into the sacroiliac joint until a firm endpoint was obtained. Proper needle placement secured. Negative aspiration confirmed. Solution injected in intermittent fashion, asking for systemic symptoms every 0.5cc of injectate. The needles were then removed and the area cleansed,  making sure to leave some of the prepping solution back to take advantage of its long term bactericidal properties. Vitals:   11/18/19 1202 11/18/19 1206 11/18/19 1212 11/18/19 1222  BP: (!) 136/91 (!) 142/95 107/61 107/62  Pulse:      Resp: 14 14 15 16   Temp:   (!) 97.4 F (36.3 C)   TempSrc:   Temporal   SpO2: 91% 92% 92% 96%  Weight:      Height:        Start Time: 1158 hrs. End Time: 1206 hrs. Materials:  Needle(s) Type: Spinal Needle Gauge: 22G Length: 3.5-in Medication(s): Please see orders for medications and dosing details.  5 cc solution made of 4 cc of 0.2% ropivacaine, 1 cc of methylprednisolone, 40 mg/cc.  2.5 cc injected intra-articular, 2.5 cc injected periarticular for right SI joint 5 cc solution made of 4 cc of 0.2% ropivacaine, 1 cc of methylprednisolone, 40 mg/cc.  2.5 cc injected intra-articular, 2.5 cc injected periarticular for left SI joint  Imaging Guidance (Non-Spinal):          Type of Imaging Technique: Fluoroscopy Guidance (Non-Spinal) Indication(s): Assistance in needle guidance and placement for procedures requiring needle placement in or near specific anatomical locations not easily accessible without such assistance. Exposure Time: Please see nurses notes. Contrast: Before injecting any contrast, we confirmed that the patient did not have an allergy to iodine, shellfish, or  radiological contrast. Once satisfactory needle placement was completed at the desired level, radiological contrast was injected. Contrast injected under live fluoroscopy. No contrast complications. See chart for type and volume of contrast used. Fluoroscopic Guidance: I was personally present during the use of fluoroscopy. "Tunnel Vision Technique" used to obtain the best possible view of the target area. Parallax error corrected before commencing the procedure. "Direction-depth-direction" technique used to introduce the needle under continuous pulsed fluoroscopy. Once target was reached, antero-posterior, oblique, and lateral fluoroscopic projection used confirm needle placement in all planes. Images permanently stored in EMR. Interpretation: I personally interpreted the imaging intraoperatively. Adequate needle placement confirmed in multiple planes. Appropriate spread of contrast into desired area was observed. No evidence of afferent or efferent intravascular uptake. Permanent images saved into the patient's record.  Antibiotic Prophylaxis:   Anti-infectives (From admission, onward)   None     Indication(s): None identified  Post-operative Assessment:  Post-procedure Vital Signs:  Pulse/HCG Rate: 8166 Temp: (!) 97.4 F (36.3 C) Resp: 16 BP: 107/62 SpO2: 96 %  EBL: None  Complications: No immediate post-treatment complications observed by team, or reported by patient.  Note: The patient tolerated the entire procedure well. A repeat set of vitals were taken after the procedure and the patient was kept under observation following institutional policy, for this type of procedure. Post-procedural neurological assessment was performed, showing return to baseline, prior to discharge. The patient was provided with post-procedure discharge instructions, including a section on how to identify potential problems. Should any problems arise concerning this procedure, the patient was given  instructions to immediately contact us, at any time, without hesitation. In any case, we plan to contact the patient by telephone for a follow-up status report regarding this interventional procedure.  Comments:  No additional relevant information.  Plan of Care  Orders:  Orders Placed This Encounter  Procedures  . [facility] C-ARM 1-60 MIN NO REPORT    Intraoperative interpretation by procedural physician at [facility].    Standing Status:   Standing    Number of Occurrences:  1    Order Specific Question:   Reason for exam:    Answer:   Assistance in needle guidance and placement for procedures requiring needle placement in or near specific anatomical locations not easily accessible without such assistance.   Medications ordered for procedure: Meds ordered this encounter  Medications  . iohexol (OMNIPAQUE) 180 MG/ML injection 10 mL    Must be Myelogram-compatible. If not available, you may substitute with a water-soluble, non-ionic, hypoallergenic, myelogram-compatible radiological contrast medium.  Marland Kitchen lidocaine (XYLOCAINE) 2 % (with pres) injection 400 mg  . fentaNYL (SUBLIMAZE) injection 25-50 mcg    Make sure Narcan is available in the pyxis when using this medication. In the event of respiratory depression (RR< 8/min): Titrate NARCAN (naloxone) in increments of 0.1 to 0.2 mg IV at 2-3 minute intervals, until desired degree of reversal.  . methylPREDNISolone acetate (DEPO-MEDROL) injection 40 mg  . methylPREDNISolone acetate (DEPO-MEDROL) injection 40 mg  . ropivacaine (PF) 2 mg/mL (0.2%) (NAROPIN) injection 4 mL  . ropivacaine (PF) 2 mg/mL (0.2%) (NAROPIN) injection 4 mL   Medications administered: We administered iohexol, lidocaine, fentaNYL, methylPREDNISolone acetate, methylPREDNISolone acetate, ropivacaine (PF) 2 mg/mL (0.2%), and ropivacaine (PF) 2 mg/mL (0.2%).  See the medical record for exact dosing, route, and time of administration.  Follow-up plan:    Return in about 4 weeks (around 12/16/2019) for Post Procedure Evaluation, in person.    Recent Visits Date Type Provider Dept  11/03/19 Office Visit Gillis Santa, MD [facility]  10/22/19 Office Visit Gillis Santa, MD [facility]  Showing recent visits within past 90 days and meeting all other requirements   Today's Visits Date Type Provider Dept  11/18/19 Procedure visit Gillis Santa, MD [facility]  Showing today's visits and meeting all other requirements   Future Appointments Date Type Provider Dept  12/15/19 Appointment Gillis Santa, MD [facility]  Showing future appointments within next 90 days and meeting all other requirements   Disposition: Discharge home  Discharge (Date  Time): 11/18/2019; 1227 hrs.   Primary Care Physician: Toni Arthurs, NP Location: [facility] Note by: Gillis Santa, MD Date: 11/18/2019; Time: 1:00 PM  Disclaimer:  Medicine is not an exact science. The only guarantee in medicine is that nothing is guaranteed. It is important to note that the decision to proceed with this intervention was based on the information collected from the patient. The Data and conclusions were drawn from the patient's questionnaire, the interview, and the physical examination. Because the information was provided in large part by the patient, it cannot be guaranteed that it has not been purposely or unconsciously manipulated. Every effort has been made to obtain as much relevant data as possible for this evaluation. It is important to note that the conclusions that lead to this procedure are derived in large part from the available data. Always take into account that the treatment will also be dependent on availability of resources and existing treatment guidelines, considered by other Pain Management Practitioners as being common knowledge and practice, at the time of the intervention. For Medico-Legal  purposes, it is also important to point out that variation in procedural techniques and pharmacological choices are the acceptable norm. The indications, contraindications, technique, and results of the above procedure should only be interpreted and judged by a Board-Certified Interventional Pain Specialist with extensive familiarity and expertise in the same exact procedure and technique.

## 2019-11-18 NOTE — Patient Instructions (Signed)

## 2019-11-18 NOTE — Progress Notes (Signed)
Safety precautions to be maintained throughout the outpatient stay will include: orient to surroundings, keep bed in low position, maintain call bell within reach at all times, provide assistance with transfer out of bed and ambulation.  

## 2019-11-19 ENCOUNTER — Telehealth: Payer: Self-pay

## 2019-11-19 NOTE — Telephone Encounter (Signed)
Post procedure phone call.  LM 

## 2019-12-09 ENCOUNTER — Institutional Professional Consult (permissible substitution): Payer: 59 | Admitting: Internal Medicine

## 2019-12-11 ENCOUNTER — Ambulatory Visit (INDEPENDENT_AMBULATORY_CARE_PROVIDER_SITE_OTHER): Payer: 59 | Admitting: Vascular Surgery

## 2019-12-15 ENCOUNTER — Ambulatory Visit
Payer: 59 | Attending: Student in an Organized Health Care Education/Training Program | Admitting: Student in an Organized Health Care Education/Training Program

## 2019-12-15 ENCOUNTER — Other Ambulatory Visit: Payer: Self-pay

## 2019-12-15 ENCOUNTER — Encounter: Payer: Self-pay | Admitting: Student in an Organized Health Care Education/Training Program

## 2019-12-15 DIAGNOSIS — M47818 Spondylosis without myelopathy or radiculopathy, sacral and sacrococcygeal region: Secondary | ICD-10-CM | POA: Diagnosis not present

## 2019-12-15 DIAGNOSIS — M461 Sacroiliitis, not elsewhere classified: Secondary | ICD-10-CM | POA: Diagnosis not present

## 2019-12-15 DIAGNOSIS — G8929 Other chronic pain: Secondary | ICD-10-CM

## 2019-12-15 DIAGNOSIS — M533 Sacrococcygeal disorders, not elsewhere classified: Secondary | ICD-10-CM

## 2019-12-15 DIAGNOSIS — G894 Chronic pain syndrome: Secondary | ICD-10-CM

## 2019-12-15 NOTE — Progress Notes (Signed)
Patient: Tammy Young  Service Category: E/M  Provider: Gillis Santa, MD  DOB: [birth year]  DOS: 12/15/2019  Location: Office  MRN: [id number]  Setting: Ambulatory outpatient  Referring Provider: Toni Arthurs, NP  Type: Established Patient  Specialty: Interventional Pain Management  PCP: Toni Arthurs, NP  Location: Home  Delivery: TeleHealth     Virtual Encounter - Pain Management PROVIDER NOTE: Information contained herein reflects review and annotations entered in association with encounter. Interpretation of such information and data should be left to medically-trained personnel. Information provided to patient can be located elsewhere in the medical record under "Patient Instructions". Document created using STT-dictation technology, any transcriptional errors that may result from process are unintentional.    Contact & Pharmacy Preferred: [phone] Home: [phone] (home) Mobile: [phone] (mobile) E-mail: [email or web address]  [facility] [place] [phone] Shari Prows, [place] [address] [place] [address] Phone: [phone] Fax: [phone]   Pre-screening  Tammy Young offered "in-person" vs "virtual" encounter. She indicated preferring virtual for this encounter.   Reason COVID-19*  Social distancing based on CDC and AMA recommendations.   I contacted Tammy Young on 12/15/2019 via video conference.      I clearly identified myself as Gillis Santa, MD. I verified that I was speaking with the correct person using two identifiers (Name: Tammy Young, and date of birth: 06-13-1966).  Consent I sought verbal advanced consent from Tammy Young for virtual visit interactions. I informed Tammy Young of possible security and privacy concerns, risks, and limitations associated with providing "not-in-person" medical evaluation and management services. I also informed Tammy Young of the availability of "in-person"  appointments. Finally, I informed her that there would be a charge for the virtual visit and that she could be  personally, fully or partially, financially responsible for it. Tammy Young expressed understanding and agreed to proceed.   Historic Elements   Tammy Young is a 54 y.o. year old, female patient evaluated today after her last contact with our practice on 11/19/2019. Tammy Young  has a past medical history of Anxiety and depression, Arthritis, Fatty liver disease, nonalcoholic, GERD (gastroesophageal reflux disease), Headache, Herpes simplex vulvovaginitis, History of kidney stones, History of stomach ulcers, HLD (hyperlipidemia), Hypertension, Hypothyroid, Hypothyroidism, Liver disease, Lymphedema of right lower extremity, Morbid obesity ([place]), Multiple gastric ulcers, PONV (postoperative nausea and vomiting), Pre-diabetes, Renal disorder, Seizures ([place]), Sleep apnea, Stress incontinence (female) (female), Vitamin B 12 deficiency, and Vitamin D deficiency. She also  has a past surgical history that includes Vaginal hysterectomy (2000); Cholecystectomy; Ureter surgery; Tonsillectomy; Carpal tunnel release (Bilateral); Direct laryngoscopy (Left, 04/15/2015); Cystoscopy with ureteroscopy, stone basketry and stent placement (10/09/2016); Breast surgery; Esophagogastroduodenoscopy (egd) with propofol (N/A, 09/03/2019); and Reduction mammaplasty. Tammy Young has a current medication list which includes the following prescription(s): albuterol, baclofen, budesonide-formoterol, cholecalciferol, cyanocobalamin, duloxetine, ergocalciferol, fexofenadine, hydrochlorothiazide, levothyroxine, linaclotide, losartan, magnesium, meclizine, methocarbamol, mirabegron er, ondansetron, pantoprazole, rabeprazole, ranitidine, rosuvastatin, sumatriptan, valacyclovir, and venlafaxine xr, and the following Facility-Administered Medications: gadopentetate dimeglumine. She  reports that she is a non-smoker but has been exposed to  tobacco smoke. She has never used smokeless tobacco. She reports previous alcohol use. She reports that she does not use drugs. Tammy Young is allergic to amoxicillin and augmentin [amoxicillin-pot clavulanate].   HPI  Today, she is being contacted for a post-procedure assessment.   Is utilizing Magnesium- is not helping with muscle spasms S/P SI-J on 11/18/19 which  was somewhat helpful, did help her with walking. States that she did not have as intense pain, is interested in repeating.  Laboratory Chemistry Profile   Renal Lab Results  Component Value Date   BUN 14 10/09/2016   CREATININE 0.80 08/14/2019   GFRAA >60 08/14/2019   GFRNONAA >60 08/14/2019     Hepatic Lab Results  Component Value Date   AST 28 10/08/2016   ALT 30 10/08/2016   ALBUMIN 4.4 10/08/2016   ALKPHOS 77 10/08/2016   LIPASE 28 10/08/2016     Electrolytes Lab Results  Component Value Date   NA 138 10/09/2016   K 3.8 10/09/2016   CL 109 10/09/2016   CALCIUM 8.4 (L) 10/09/2016     Bone No results found for: VD25OH, VD125OH2TOT, UX3235TD3, UK0254YH0, 25OHVITD1, 25OHVITD2, 25OHVITD3, TESTOFREE, TESTOSTERONE   Inflammation (CRP: Acute Phase) (ESR: Chronic Phase) No results found for: CRP, ESRSEDRATE, LATICACIDVEN     Note: Above Lab results reviewed.   Assessment  The primary encounter diagnosis was SI (sacroiliac) joint dysfunction. 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  Plan of Care  Tammy Young has a current medication list which includes the following long-term medication(s): albuterol, budesonide-formoterol, duloxetine, fexofenadine, hydrochlorothiazide, levothyroxine, linaclotide, mirabegron er, rabeprazole, ranitidine, rosuvastatin, and sumatriptan.  Status post bilateral sacroiliac joint injection on 11/18/2019.  States that it did provide  moderate pain relief for approximately 10 to 12 days.  She states that she was able to ambulate with less pain.  Is interested in repeating SI joint injection.  We also discussed peripheral nerve stimulation with spread.  Patient states that she would like to try something less invasive and would like to repeat an SI joint injection and see how that does.  Risks and benefits reviewed and patient would like to proceed.  Orders:  Orders Placed This Encounter  Procedures  . SACROILIAC JOINT INJECTION    Standing Status:   Future    Standing Expiration Date:   01/15/2020    Scheduling Instructions:     Side: Bilateral     Sedation: with: ASAP    Order Specific Question:   Where will this procedure be performed?    Answer:   [facility]   Follow-up plan:   Return in about 2 weeks (around 12/29/2019) for B/L SI-J , with sedation.     Status post bilateral SI joint injection with sedation on 11/18/2019, helped, repeat.  Consider sprint PNS in future   Recent Visits Date Type Provider Dept  11/18/19 Procedure visit Gillis Santa, MD [facility]  11/03/19 Office Visit Gillis Santa, MD [facility]  10/22/19 Office Visit Gillis Santa, MD [facility]  Showing recent visits within past 90 days and meeting all other requirements   Today's Visits Date Type Provider Dept  12/15/19 Telemedicine Gillis Santa, MD [facility]  Showing today's visits and meeting all other requirements   Future Appointments No visits were found meeting these conditions.  Showing future appointments within next 90 days and meeting all other requirements   I discussed the assessment and treatment plan with the patient. The patient was provided an opportunity to ask questions and all were answered. The patient agreed with the plan and demonstrated an understanding of the instructions.  Patient advised to call back or seek an in-person evaluation if the symptoms or condition  worsens.  Duration of encounter: 30 minutes.  Note by: Gillis Santa, MD Date: 12/15/2019; Time: 2:25 PM

## 2019-12-18 ENCOUNTER — Other Ambulatory Visit (INDEPENDENT_AMBULATORY_CARE_PROVIDER_SITE_OTHER): Payer: 59 | Admitting: Vascular Surgery

## 2019-12-22 ENCOUNTER — Encounter (INDEPENDENT_AMBULATORY_CARE_PROVIDER_SITE_OTHER): Payer: 59

## 2020-01-11 ENCOUNTER — Ambulatory Visit
Admission: RE | Admit: 2020-01-11 | Discharge: 2020-01-11 | Disposition: A | Discharge status: Home / Self Care | Payer: 59 | Source: Ambulatory Visit | Attending: Student in an Organized Health Care Education/Training Program | Admitting: Student in an Organized Health Care Education/Training Program

## 2020-01-11 ENCOUNTER — Other Ambulatory Visit: Payer: Self-pay

## 2020-01-11 ENCOUNTER — Ambulatory Visit (HOSPITAL_BASED_OUTPATIENT_CLINIC_OR_DEPARTMENT_OTHER): Payer: 59 | Admitting: Student in an Organized Health Care Education/Training Program

## 2020-01-11 DIAGNOSIS — M549 Dorsalgia, unspecified: Secondary | ICD-10-CM | POA: Insufficient documentation

## 2020-01-11 DIAGNOSIS — M461 Sacroiliitis, not elsewhere classified: Secondary | ICD-10-CM | POA: Diagnosis not present

## 2020-01-11 DIAGNOSIS — M533 Sacrococcygeal disorders, not elsewhere classified: Secondary | ICD-10-CM

## 2020-01-11 DIAGNOSIS — G8929 Other chronic pain: Secondary | ICD-10-CM

## 2020-01-11 DIAGNOSIS — M47818 Spondylosis without myelopathy or radiculopathy, sacral and sacrococcygeal region: Secondary | ICD-10-CM

## 2020-01-11 MED ORDER — LIDOCAINE HCL 2 % IJ SOLN
20.0000 mL | Freq: Once | INTRAMUSCULAR | Status: AC
  Administered 2020-01-11: 400 mg
  Filled 2020-01-11: qty 40

## 2020-01-11 MED ORDER — METHYLPREDNISOLONE ACETATE 40 MG/ML IJ SUSP
40.0000 mg | Freq: Once | INTRAMUSCULAR | Status: AC
  Administered 2020-01-11: 40 mg via INTRA_ARTICULAR
  Filled 2020-01-11: qty 1

## 2020-01-11 MED ORDER — FENTANYL CITRATE (PF) 100 MCG/2ML IJ SOLN
25.0000 ug | INTRAMUSCULAR | Status: AC | PRN
  Administered 2020-01-11: 100 ug via INTRAVENOUS
  Administered 2020-01-11: 50 ug via INTRAVENOUS
  Filled 2020-01-11: qty 2

## 2020-01-11 MED ORDER — ROPIVACAINE HCL 2 MG/ML IJ SOLN
9.0000 mL | Freq: Once | INTRAMUSCULAR | Status: AC
  Administered 2020-01-11: 9 mL via PERINEURAL
  Filled 2020-01-11: qty 10

## 2020-01-11 MED ORDER — IOHEXOL 180 MG/ML  SOLN
10.0000 mL | Freq: Once | INTRAMUSCULAR | Status: AC
  Administered 2020-01-11: 10 mL via INTRA_ARTICULAR
  Filled 2020-01-11: qty 20

## 2020-01-11 NOTE — Progress Notes (Signed)
Safety precautions to be maintained throughout the outpatient stay will include: orient to surroundings, keep bed in low position, maintain call bell within reach at all times, provide assistance with transfer out of bed and ambulation.  

## 2020-01-11 NOTE — Patient Instructions (Signed)

## 2020-01-11 NOTE — Progress Notes (Signed)
PROVIDER NOTE: Information contained herein reflects review and annotations entered in association with encounter. Interpretation of such information and data should be left to medically-trained personnel. Information provided to patient can be located elsewhere in the medical record under "Patient Instructions". Document created using STT-dictation technology, any transcriptional errors that may result from process are unintentional.    Patient: Tammy Young  Service Category: Procedure  Provider: Gillis Santa, MD  DOB: [birth year]  DOS: 01/11/2020  Location: [facility]  MRN: [id number]  Setting: Ambulatory - outpatient  Referring Provider: Gillis Santa, MD  Type: Established Patient  Specialty: Interventional Pain Management  PCP: Toni Arthurs, NP   Primary Reason for Visit: Interventional Pain Management Treatment. CC: Back Pain (bilateral lumbar )  Procedure:          Anesthesia, Analgesia, Anxiolysis:  Type: Diagnostic Sacroiliac Joint Steroid Injection #2 (#1 on 11/18/19)  Region: Inferior Lumbosacral Region Level: PIIS (Posterior Inferior Iliac Spine) Laterality: Bilateral  Type: Moderate (Conscious) Sedation combined with Local Anesthesia Indication(s): Analgesia and Anxiety Route: Intravenous (IV) IV Access: Secured Sedation: Meaningful verbal contact was maintained at all times during the procedure  Local Anesthetic: Lidocaine 1-2%  Position: Prone           Indications: 1. SI (sacroiliac) joint dysfunction   2. SI (sacroiliac) joint inflammation (H[facility])   3. 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 Chronic SI joint pain    Pain Score: Pre-procedure: 7 /10 Post-procedure: 2 /10   Pre-op Assessment:  Tammy Young is a 54 y.o. (year old), female patient, seen today for interventional treatment. She  has a past surgical history that includes Vaginal hysterectomy (2000); Cholecystectomy; Ureter surgery; Tonsillectomy; Carpal tunnel release (Bilateral);  Direct laryngoscopy (Left, 04/15/2015); Cystoscopy with ureteroscopy, stone basketry and stent placement (10/09/2016); Breast surgery; Esophagogastroduodenoscopy (egd) with propofol (N/A, 09/03/2019); and Reduction mammaplasty. Tammy Young has a current medication list which includes the following prescription(s): albuterol, baclofen, budesonide-formoterol, cholecalciferol, cyanocobalamin, ergocalciferol, gabapentin, levothyroxine, linaclotide, losartan, magnesium, meclizine, methocarbamol, ondansetron, pantoprazole, rabeprazole, rosuvastatin, valacyclovir, venlafaxine xr, duloxetine, fexofenadine, hydrochlorothiazide, mirabegron er, ranitidine, and sumatriptan, and the following Facility-Administered Medications: gadopentetate dimeglumine. Her primarily concern today is the Back Pain (bilateral lumbar )  Initial Vital Signs:  Pulse/HCG Rate: 88ECG Heart Rate: 87 Temp: (!) 97 F (36.1 C) Resp: 18 BP: (!) 173/99 SpO2: 94 %  BMI: Estimated body mass index is 44.1 kg/m as calculated from the following:   Height as of this encounter: 5\' 5"  (1.651 m).   Weight as of this encounter: 265 lb (120.2 kg).  Risk Assessment: Allergies: Reviewed. She is allergic to amoxicillin and augmentin [amoxicillin-pot clavulanate].  Allergy Precautions: None required Coagulopathies: Reviewed. None identified.  Blood-thinner therapy: None at this time Active Infection(s): Reviewed. None identified. Tammy Young is afebrile  Site Confirmation: Tammy Young was asked to confirm the procedure and laterality before marking the site Procedure checklist: Completed Consent: Before the procedure and under the influence of no sedative(s), amnesic(s), or anxiolytics, the patient was informed of the treatment options, risks and possible complications. To fulfill our ethical and legal obligations, as recommended by the American Medical Association's Code of Ethics, I have informed the patient of my clinical impression; the nature and  purpose of the treatment or procedure; the risks, benefits, and possible complications of the intervention; the alternatives, including doing nothing; the risk(s) and benefit(s) of the alternative treatment(s) or procedure(s); and the risk(s) and benefit(s) of doing nothing. The patient was provided information about  the general risks and possible complications associated with the procedure. These may include, but are not limited to: failure to achieve desired goals, infection, bleeding, organ or nerve damage, allergic reactions, paralysis, and death. In addition, the patient was informed of those risks and complications associated to the procedure, such as failure to decrease pain; infection; bleeding; organ or nerve damage with subsequent damage to sensory, motor, and/or autonomic systems, resulting in permanent pain, numbness, and/or weakness of one or several areas of the body; allergic reactions; (i.e.: anaphylactic reaction); and/or death. Furthermore, the patient was informed of those risks and complications associated with the medications. These include, but are not limited to: allergic reactions (i.e.: anaphylactic or anaphylactoid reaction(s)); adrenal axis suppression; blood sugar elevation that in diabetics may result in ketoacidosis or comma; water retention that in patients with history of congestive heart failure may result in shortness of breath, pulmonary edema, and decompensation with resultant heart failure; weight gain; swelling or edema; medication-induced neural toxicity; particulate matter embolism and blood vessel occlusion with resultant organ, and/or nervous system infarction; and/or aseptic necrosis of one or more joints. Finally, the patient was informed that Medicine is not an exact science; therefore, there is also the possibility of unforeseen or unpredictable risks and/or possible complications that may result in a catastrophic outcome. The patient indicated having understood very  clearly. We have given the patient no guarantees and we have made no promises. Enough time was given to the patient to ask questions, all of which were answered to the patient's satisfaction. Tammy Young has indicated that she wanted to continue with the procedure. Attestation: I, the ordering provider, attest that I have discussed with the patient the benefits, risks, side-effects, alternatives, likelihood of achieving goals, and potential problems during recovery for the procedure that I have provided informed consent. Date  Time: 01/11/2020 10:48 AM  Pre-Procedure Preparation:  Monitoring: As per clinic protocol. Respiration, ETCO2, SpO2, BP, heart rate and rhythm monitor placed and checked for adequate function Safety Precautions: Patient was assessed for positional comfort and pressure points before starting the procedure. Time-out: I initiated and conducted the "Time-out" before starting the procedure, as per protocol. The patient was asked to participate by confirming the accuracy of the "Time Out" information. Verification of the correct person, site, and procedure were performed and confirmed by me, the nursing staff, and the patient. "Time-out" conducted as per Joint Commission's Universal Protocol (UP.01.01.01). Time: 1150  Description of Procedure:          Target Area: Superior, posterior, aspect of the sacroiliac fissure Approach: Posterior, paraspinal, ipsilateral approach. Area Prepped: Entire Lower Lumbosacral Region DuraPrep (Iodine Povacrylex [0.7% available iodine] and Isopropyl Alcohol, 74% w/w) Safety Precautions: Aspiration looking for blood return was conducted prior to all injections. At no point did we inject any substances, as a needle was being advanced. No attempts were made at seeking any paresthesias. Safe injection practices and needle disposal techniques used. Medications properly checked for expiration dates. SDV (single dose vial) medications used. Description of the  Procedure: Protocol guidelines were followed. The patient was placed in position over the procedure table. The target area was identified and the area prepped in the usual manner. Skin & deeper tissues infiltrated with local anesthetic. Appropriate amount of time allowed to pass for local anesthetics to take effect. The procedure needle was advanced under fluoroscopic guidance into the sacroiliac joint until a firm endpoint was obtained. Proper needle placement secured. Negative aspiration confirmed. Solution injected in intermittent fashion, asking for  systemic symptoms every 0.5cc of injectate. The needles were then removed and the area cleansed, making sure to leave some of the prepping solution back to take advantage of its long term bactericidal properties. Vitals:   01/11/20 1207 01/11/20 1211 01/11/20 1217 01/11/20 1227  BP: 138/69 126/60 (!) 121/59 (!) 149/88  Pulse:      Resp: 14  16 16   Temp:      TempSrc:      SpO2: 96%  96% 95%  Weight:      Height:        Start Time: 1150 hrs. End Time: 1158 hrs. Materials:  Needle(s) Type: Spinal Needle Gauge: 22G Length: 3.5-in Medication(s): Please see orders for medications and dosing details.  5 cc solution made of 4 cc of 0.2% ropivacaine, 1 cc of methylprednisolone, 40 mg/cc.  2.5 cc injected intra-articular, 2.5 cc injected periarticular for right SI joint 5 cc solution made of 4 cc of 0.2% ropivacaine, 1 cc of methylprednisolone, 40 mg/cc.  2.5 cc injected intra-articular, 2.5 cc injected periarticular for left SI joint  Imaging Guidance (Non-Spinal):          Type of Imaging Technique: Fluoroscopy Guidance (Non-Spinal) Indication(s): Assistance in needle guidance and placement for procedures requiring needle placement in or near specific anatomical locations not easily accessible without such assistance. Exposure Time: Please see nurses notes. Contrast: Before injecting any contrast, we confirmed that the patient did not have an  allergy to iodine, shellfish, or radiological contrast. Once satisfactory needle placement was completed at the desired level, radiological contrast was injected. Contrast injected under live fluoroscopy. No contrast complications. See chart for type and volume of contrast used. Fluoroscopic Guidance: I was personally present during the use of fluoroscopy. "Tunnel Vision Technique" used to obtain the best possible view of the target area. Parallax error corrected before commencing the procedure. "Direction-depth-direction" technique used to introduce the needle under continuous pulsed fluoroscopy. Once target was reached, antero-posterior, oblique, and lateral fluoroscopic projection used confirm needle placement in all planes. Images permanently stored in EMR. Interpretation: I personally interpreted the imaging intraoperatively. Adequate needle placement confirmed in multiple planes. Appropriate spread of contrast into desired area was observed. No evidence of afferent or efferent intravascular uptake. Permanent images saved into the patient's record.  Antibiotic Prophylaxis:   Anti-infectives (From admission, onward)   None     Indication(s): None identified  Post-operative Assessment:  Post-procedure Vital Signs:  Pulse/HCG Rate: 8876 Temp: (!) 97 F (36.1 C) Resp: 16 BP: (!) 149/88 SpO2: 95 %  EBL: None  Complications: No immediate post-treatment complications observed by team, or reported by patient.  Note: The patient tolerated the entire procedure well. A repeat set of vitals were taken after the procedure and the patient was kept under observation following institutional policy, for this type of procedure. Post-procedural neurological assessment was performed, showing return to baseline, prior to discharge. The patient was provided with post-procedure discharge instructions, including a section on how to identify potential problems. Should any problems arise concerning this  procedure, the patient was given instructions to immediately contact us, at any time, without hesitation. In any case, we plan to contact the patient by telephone for a follow-up status report regarding this interventional procedure.  Comments:  No additional relevant information.  Plan of Care  Orders:  Orders Placed This Encounter  Procedures  . [facility] PA[facility] C-ARM 1-60 MIN NO REPORT    Intraoperative interpretation by procedural physician at [facility].  Standing Status:   Standing    Number of Occurrences:   1    Order Specific Question:   Reason for exam:    Answer:   Assistance in needle guidance and placement for procedures requiring needle placement in or near specific anatomical locations not easily accessible without such assistance.   Medications ordered for procedure: Meds ordered this encounter  Medications  . iohexol (OMNIPAQUE) 180 MG/ML injection 10 mL    Must be Myelogram-compatible. If not available, you may substitute with a water-soluble, non-ionic, hypoallergenic, myelogram-compatible radiological contrast medium.  Marland Kitchen lidocaine (XYLOCAINE) 2 % (with pres) injection 400 mg  . fentaNYL (SUBLIMAZE) injection 25-50 mcg    Make sure Narcan is available in the pyxis when using this medication. In the event of respiratory depression (RR< 8/min): Titrate NARCAN (naloxone) in increments of 0.1 to 0.2 mg IV at 2-3 minute intervals, until desired degree of reversal.  . ropivacaine (PF) 2 mg/mL (0.2%) (NAROPIN) injection 9 mL  . methylPREDNISolone acetate (DEPO-MEDROL) injection 40 mg  . methylPREDNISolone acetate (DEPO-MEDROL) injection 40 mg   Medications administered: We administered iohexol, lidocaine, fentaNYL, ropivacaine (PF) 2 mg/mL (0.2%), methylPREDNISolone acetate, and methylPREDNISolone acetate.  See the medical record for exact dosing, route, and time of administration.  Follow-up plan:   Return in about 4 weeks (around 02/08/2020) for Post  Procedure Evaluation, virtual.    Recent Visits Date Type Provider Dept  12/15/19 Telemedicine Gillis Santa, MD [facility]  11/18/19 Procedure visit Gillis Santa, MD [facility]  11/03/19 Office Visit Gillis Santa, MD [facility]  10/22/19 Office Visit Gillis Santa, MD [facility]  Showing recent visits within past 90 days and meeting all other requirements Today's Visits Date Type Provider Dept  01/11/20 Procedure visit Gillis Santa, MD [facility]  Showing today's visits and meeting all other requirements Future Appointments Date Type Provider Dept  02/15/20 Appointment Gillis Santa, MD [facility]  Showing future appointments within next 90 days and meeting all other requirements  Disposition: Discharge home  Discharge (Date  Time): 01/11/2020; 1227 hrs.   Primary Care Physician: Toni Arthurs, NP Location: [facility] Note by: Gillis Santa, MD Date: 01/11/2020; Time: 12:35 PM  Disclaimer:  Medicine is not an exact science. The only guarantee in medicine is that nothing is guaranteed. It is important to note that the decision to proceed with this intervention was based on the information collected from the patient. The Data and conclusions were drawn from the patient's questionnaire, the interview, and the physical examination. Because the information was provided in large part by the patient, it cannot be guaranteed that it has not been purposely or unconsciously manipulated. Every effort has been made to obtain as much relevant data as possible for this evaluation. It is important to note that the conclusions that lead to this procedure are derived in large part from the available data. Always take into account that the treatment will also be dependent on availability of resources and existing treatment guidelines, considered by other Pain Management Practitioners as being common knowledge  and practice, at the time of the intervention. For Medico-Legal purposes, it is also important to point out that variation in procedural techniques and pharmacological choices are the acceptable norm. The indications, contraindications, technique, and results of the above procedure should only be interpreted and judged by a Board-Certified Interventional Pain Specialist with extensive familiarity and expertise in the same exact procedure and technique.

## 2020-01-12 ENCOUNTER — Telehealth: Payer: Self-pay

## 2020-01-12 NOTE — Telephone Encounter (Signed)
Post procedue phone call.  Patient states she is doing ok.

## 2020-01-20 ENCOUNTER — Other Ambulatory Visit: Payer: Self-pay | Admitting: Nurse Practitioner

## 2020-01-20 ENCOUNTER — Encounter: Payer: Self-pay | Admitting: Emergency Medicine

## 2020-01-20 ENCOUNTER — Other Ambulatory Visit: Payer: Self-pay

## 2020-01-20 ENCOUNTER — Ambulatory Visit (INDEPENDENT_AMBULATORY_CARE_PROVIDER_SITE_OTHER)
Admission: EM | Admit: 2020-01-20 | Discharge: 2020-01-20 | Discharge status: Against Medical Advice | Payer: 59 | Source: Home / Self Care

## 2020-01-20 ENCOUNTER — Other Ambulatory Visit (HOSPITAL_COMMUNITY): Payer: Self-pay | Admitting: Nurse Practitioner

## 2020-01-20 ENCOUNTER — Emergency Department
Admission: EM | Admit: 2020-01-20 | Discharge: 2020-01-20 | Disposition: A | Discharge status: Home / Self Care | Payer: 59 | Attending: Emergency Medicine | Admitting: Emergency Medicine

## 2020-01-20 DIAGNOSIS — Z7722 Contact with and (suspected) exposure to environmental tobacco smoke (acute) (chronic): Secondary | ICD-10-CM | POA: Diagnosis not present

## 2020-01-20 DIAGNOSIS — R0689 Other abnormalities of breathing: Secondary | ICD-10-CM | POA: Diagnosis not present

## 2020-01-20 DIAGNOSIS — K122 Cellulitis and abscess of mouth: Secondary | ICD-10-CM | POA: Diagnosis not present

## 2020-01-20 DIAGNOSIS — R220 Localized swelling, mass and lump, head: Secondary | ICD-10-CM | POA: Diagnosis not present

## 2020-01-20 DIAGNOSIS — Z7901 Long term (current) use of anticoagulants: Secondary | ICD-10-CM | POA: Insufficient documentation

## 2020-01-20 DIAGNOSIS — R221 Localized swelling, mass and lump, neck: Secondary | ICD-10-CM

## 2020-01-20 DIAGNOSIS — Z79899 Other long term (current) drug therapy: Secondary | ICD-10-CM | POA: Diagnosis not present

## 2020-01-20 DIAGNOSIS — N183 Chronic kidney disease, stage 3 unspecified: Secondary | ICD-10-CM | POA: Diagnosis not present

## 2020-01-20 DIAGNOSIS — T7840XA Allergy, unspecified, initial encounter: Secondary | ICD-10-CM | POA: Diagnosis not present

## 2020-01-20 DIAGNOSIS — M5442 Lumbago with sciatica, left side: Secondary | ICD-10-CM

## 2020-01-20 DIAGNOSIS — I129 Hypertensive chronic kidney disease with stage 1 through stage 4 chronic kidney disease, or unspecified chronic kidney disease: Secondary | ICD-10-CM | POA: Insufficient documentation

## 2020-01-20 DIAGNOSIS — E039 Hypothyroidism, unspecified: Secondary | ICD-10-CM | POA: Diagnosis not present

## 2020-01-20 LAB — GROUP A STREP BY PCR: Group A Strep by PCR: NOT DETECTED

## 2020-01-20 MED ORDER — CLINDAMYCIN HCL 150 MG PO CAPS
300.0000 mg | ORAL_CAPSULE | Freq: Once | ORAL | Status: AC
  Administered 2020-01-20: 300 mg via ORAL
  Filled 2020-01-20: qty 2

## 2020-01-20 MED ORDER — DEXAMETHASONE SODIUM PHOSPHATE 4 MG/ML IJ SOLN
4.0000 mg | Freq: Once | INTRAMUSCULAR | Status: AC
  Administered 2020-01-20: 4 mg via INTRAVENOUS
  Filled 2020-01-20: qty 1

## 2020-01-20 MED ORDER — DEXAMETHASONE SODIUM PHOSPHATE 10 MG/ML IJ SOLN
10.0000 mg | Freq: Once | INTRAMUSCULAR | Status: AC
  Administered 2020-01-20: 10 mg via INTRAMUSCULAR

## 2020-01-20 MED ORDER — CLINDAMYCIN HCL 300 MG PO CAPS
300.0000 mg | ORAL_CAPSULE | Freq: Three times a day (TID) | ORAL | 0 refills | Status: AC
Start: 2020-01-20 — End: 2020-01-30

## 2020-01-20 MED ORDER — AMLODIPINE BESYLATE 10 MG PO TABS
10.0000 mg | ORAL_TABLET | Freq: Every day | ORAL | 0 refills | Status: DC
Start: 2020-01-20 — End: 2020-02-18

## 2020-01-20 MED ORDER — METHYLPREDNISOLONE 4 MG PO TBPK
ORAL_TABLET | ORAL | 0 refills | Status: DC
Start: 2020-01-21 — End: 2020-02-17

## 2020-01-20 MED ORDER — DIPHENHYDRAMINE HCL 50 MG/ML IJ SOLN
25.0000 mg | Freq: Once | INTRAMUSCULAR | Status: AC
  Administered 2020-01-20: 25 mg via INTRAVENOUS
  Filled 2020-01-20: qty 1

## 2020-01-20 MED ORDER — RACEPINEPHRINE HCL 2.25 % IN NEBU
0.5000 mL | INHALATION_SOLUTION | Freq: Once | RESPIRATORY_TRACT | Status: AC
  Administered 2020-01-20: 0.5 mL via RESPIRATORY_TRACT
  Filled 2020-01-20: qty 0.5

## 2020-01-20 NOTE — ED Triage Notes (Addendum)
Patient from [facility] via [facility]. Patient woke up with painful swallowing. UC noticed swollen uvula and thinks pt may have had allergic reaction. Unsure of to what. Patient in low 90s on room air. States when she lays back it is hard for her to breathe.  Given steroid at UC  20G L a/c

## 2020-01-20 NOTE — ED Provider Notes (Signed)
[facility]    CSN: [id number] Arrival date & time: 01/20/20  0813      History   Chief Complaint Chief Complaint  Patient presents with   Sore Throat    HPI Tammy Young is a 54 y.o. female.   Patient is a 54 year old female past medical history of hypertension who presents this morning with complaint of sore throat, stating feels like there is something stuck in her throat.  She states she cannot cough anything up.  She reports some trouble breathing, talking, and swallowing.  She is not taking medications this morning.  She reports that she is on losartan and takes her medication for blood pressure at night.  She has not had any reactions to her blood pressure medications in the past and has no known history of angioedema that she can think of.  When asked she is able to breathe well into her nose and her mouth but feels like there is something flapping on the inside.     Past Medical History:  Diagnosis Date   Anxiety and depression    Arthritis    Fatty liver disease, nonalcoholic    GERD (gastroesophageal reflux disease)    Headache    Herpes simplex vulvovaginitis    History of kidney stones    History of stomach ulcers    HLD (hyperlipidemia)    Hypertension    Hypothyroid    Hypothyroidism    Liver disease    Lymphedema of right lower extremity    Morbid obesity (HCC)    Multiple gastric ulcers    PONV (postoperative nausea and vomiting)    Pre-diabetes    Renal disorder    kidney stones and shortened ureter repaired with surgery as a child.   Seizures ([place])    only had with preeclampsia during pregnancy at age 50. 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 12/15/2019   Chronic pain syndrome 10/22/2019   Chronic bilateral low back pain  without sciatica 10/22/2019   SI (sacroiliac) joint inflammation ([place]) 10/22/2019   Bilateral hip pain 10/22/2019   Insect bite of left elbow 10/06/2019   Pruritic rash 09/11/2019   Osteopenia of multiple sites 08/31/2019   Swelling of limb 08/28/2019   Pain in limb 08/28/2019   Varicose veins of leg with pain, right 08/20/2019   Stroke-like symptoms 08/20/2019   Prediabetes 08/18/2019   Vitamin B12 deficiency 08/18/2019   Vitamin D deficiency 08/18/2019   Lymphedema of right lower extremity 08/14/2019   Stress incontinence in female 08/14/2019   Recurrent major depressive disorder, in partial remission ([place]) 01/14/2018   Obstructive sleep apnea syndrome 10/18/2017   Sialadenitis 10/17/2017   SOB (shortness of breath) 09/15/2017   Chronic cough 08/01/2017   Obstructive uropathy 10/08/2016   Ureterolithiasis    Acute right flank pain    Intractable pain    Chronic renal disease, stage 3, moderately decreased glomerular filtration rate between 30-59 mL/min/1.73 square meter 07/02/2016   Essential hypertension 06/22/2016   Intractable chronic migraine without aura and with status migrainosus 12/02/2015   Chronic daily headache 11/16/2015   Tension headache 11/16/2015   Chronic pansinusitis 10/24/2015   Chronic tension-type headache, intractable 10/24/2015   Hemorrhoids 10/24/2015   Hyperlipidemia, unspecified 10/24/2015  Herpesviral vulvovaginitis 11/17/2014   Multiple gastric ulcers 04/16/2014   Class 3 severe obesity with serious comorbidity and body mass index (BMI) of 40.0 to 44.9 in adult ([place]) 03/17/2014   Epigastric pain 03/17/2014   Fatty (change of) liver, not elsewhere classified 03/17/2014   Gastroesophageal reflux disease 03/17/2014   Hypothyroidism, unspecified 10/23/2012   Thoracic back pain 10/23/2012   Myalgia, other site 10/08/2012   Calculus of kidney 05/30/2011   Carpal tunnel syndrome 05/30/2011    Past  Surgical History:  Procedure Laterality Date   BREAST SURGERY     reduction   CARPAL TUNNEL RELEASE Bilateral    CHOLECYSTECTOMY     CYSTOSCOPY WITH URETEROSCOPY, STONE BASKETRY AND STENT PLACEMENT  10/09/2016   Procedure: CYSTOSCOPY WITH URETEROSCOPY, STONE BASKETRY AND STENT PLACEMENT;  Surgeon: Hollice Espy, MD;  Location: [facility];  Service: Urology;;   DIRECT LARYNGOSCOPY Left 04/15/2015   Procedure: DIRECT LARYNGOSCOPY BIOPSY AND LASER LEFT TONGUE LESION;  Surgeon: Melissa Montane, MD;  Location: [facility];  Service: ENT;  Laterality: Left;   ESOPHAGOGASTRODUODENOSCOPY (EGD) WITH PROPOFOL N/A 09/03/2019   Procedure: ESOPHAGOGASTRODUODENOSCOPY (EGD) WITH PROPOFOL;  Surgeon: Toledo, Benay Pike, MD;  Location: [facility];  Service: Gastroenterology;  Laterality: N/A;   REDUCTION MAMMAPLASTY     TONSILLECTOMY     URETER SURGERY     VAGINAL HYSTERECTOMY  2000    OB History   No obstetric history on file.      Home Medications    Prior to Admission medications   Medication Sig Start Date End Date Taking? Authorizing Provider  albuterol (PROVENTIL) (2.5 MG/3ML) 0.083% nebulizer solution Inhale into the lungs. 06/29/19 06/28/20 Yes [provider]  baclofen (LIORESAL) 10 MG tablet Take 1 tablet (10 mg total) by mouth 3 (three) times daily as needed. 10/03/18  Yes Coral Spikes, DO  budesonide-formoterol (SYMBICORT) 160-4.5 MCG/ACT inhaler  08/16/19  Yes [provider]  Cholecalciferol 50 MCG (2000 UT) CAPS Take 3 capsules daily for 3 months, then reduce to 1 capsule daily thereafter for Vitamin D Deficiency. 08/18/19  Yes [provider]  Cyanocobalamin (VITAMIN B 12 PO) Take 5,000 mg by mouth.   Yes [provider]  DULoxetine (CYMBALTA) 30 MG capsule Take 1 capsule by mouth daily. 01/14/18 01/20/20 Yes [provider]  ergocalciferol (VITAMIN D2) 1.25 MG (50000 UT) capsule Take by mouth. 12/30/18  Yes [provider]  gabapentin (NEURONTIN) 600 MG tablet Take 300 mg by mouth in the morning, at noon, and at bedtime. 01/06/20 01/05/21 Yes [provider]  hydrochlorothiazide (HYDRODIURIL) 25 MG tablet Take 25 mg by mouth daily.    Yes [provider]  levothyroxine (SYNTHROID, LEVOTHROID) 25 MCG tablet Take 25 mcg by mouth daily before breakfast.   Yes [provider]  linaclotide (LINZESS) 72 MCG capsule Take 72 mcg by mouth daily before breakfast.   Yes [provider]  losartan (COZAAR) 100 MG tablet Take 100 mg by mouth daily. 07/19/19  Yes [provider]  Magnesium 500 MG CAPS Take 1 capsule (500 mg total) by mouth 2 (two) times daily at 8 am and 10 pm. 11/03/19 05/01/20 Yes Gillis Santa, MD  meclizine (ANTIVERT) 25 MG tablet Take 25 mg by mouth 3 (three) times daily as needed for dizziness.   Yes [provider]  methocarbamol (ROBAXIN) 750 MG tablet Take 750 mg by mouth 4 (four) times daily.   Yes [provider]  ondansetron (ZOFRAN-ODT) 4 MG disintegrating  tablet Take 4 mg by mouth every 8 (eight) hours as needed for nausea or vomiting.   Yes [provider]  pantoprazole (PROTONIX) 40 MG tablet Take 40 mg by mouth daily. 08/16/19  Yes [provider]  rosuvastatin (CRESTOR) 10 MG tablet Take 10 mg by mouth daily.   Yes [provider]  SUMAtriptan (IMITREX) 100 MG tablet TK 1 T PO ONCE PRF MIGRAINE. MAY TAKE 2ND DOSE AFTER 2 HOURS IF STILL NEEDED 03/12/19  Yes [provider]  valACYclovir (VALTREX) 500 MG tablet TAKE ONE CAPLET BY MOUTH TWICE DAILY 10/10/15  Yes [provider]  venlafaxine XR (EFFEXOR-XR) 75 MG 24 hr capsule Take 75 mg by mouth daily with breakfast.   Yes [provider]  amLODipine (NORVASC) 10 MG tablet Take 1 tablet (10 mg total) by mouth daily. 01/20/20   Luvenia Redden, PA-C  methylPREDNISolone (MEDROL DOSEPAK) 4 MG TBPK tablet Take per package instructions 01/21/20    Luvenia Redden, PA-C  mirabegron ER (MYRBETRIQ) 25 MG TB24 tablet Take 25 mg by mouth daily. Patient not taking: Reported on 01/11/2020    [provider]  fexofenadine (ALLEGRA) 180 MG tablet Take 180 mg by mouth daily. Patient not taking: Reported on 01/11/2020 07/19/19 01/20/20  [provider]  RABEprazole (ACIPHEX) 20 MG tablet Take 20 mg by mouth daily. 07/19/19 01/20/20  [provider]  ranitidine (ZANTAC) 150 MG tablet Take 150 mg by mouth at bedtime. Patient not taking: Reported on 01/11/2020  01/20/20  [provider]    Family History Family History  Problem Relation Age of Onset   Kidney Stones Mother    Diabetes Mother    COPD Mother    Kidney Stones Father    Migraines Maternal Grandmother    Stroke Maternal Grandmother    Diabetes Maternal Grandmother    Prostate cancer Maternal Grandfather    Kidney cancer Neg Hx    Bladder Cancer Neg Hx     Social History Social History   Tobacco Use   Smoking status: Passive Smoke Exposure - Never Smoker   Smokeless tobacco: Never Used   Tobacco comment: mother smokes outside/ mother deceased  Vaping Use   Vaping Use: Never used  Substance Use Topics   Alcohol use: Not Currently    Comment: rarely   Drug use: No     Allergies   Amoxicillin and Augmentin [amoxicillin-pot clavulanate]   Review of Systems Review of Systems as noted above HPI.  Other systems reviewed and found to be negative.   Physical Exam Triage Vital Signs ED Triage Vitals  Enc Vitals Group     BP 01/20/20 0822 (!) 160/88     Pulse Rate 01/20/20 0822 85     Resp 01/20/20 0822 18     Temp 01/20/20 0822 (!) 97.5 F (36.4 C)     Temp Source 01/20/20 0822 Oral     SpO2 01/20/20 0822 98 %     Weight 01/20/20 0823 260 lb (117.9 kg)     Height 01/20/20 0823 5\' 5"  (1.651 m)     Head Circumference --      Peak Flow --      Pain Score 01/20/20 0822 10     Pain Loc --      Pain Edu? --       Excl. in [place]? --    No data found.  Updated Vital Signs BP (!) 160/88 (BP Location: Left Arm)    Pulse 85  Temp (!) 97.5 F (36.4 C) (Oral)    Resp 18    Ht 5\' 5"  (1.651 m)    Wt 260 lb (117.9 kg)    SpO2 98%    BMI 43.27 kg/m   Physical Exam Constitutional:      Appearance: She is well-developed.     Comments: Voice distorted due to throat edema   HENT:     Head: Normocephalic and atraumatic.     Comments: Mild facial swelling     Mouth/Throat:     Mouth: Mucous membranes are moist.     Pharynx: Uvula swelling (edematous with increase of 2-3 in size) present. No posterior oropharyngeal erythema.     Tonsils: No tonsillar exudate or tonsillar abscesses. 0 on the left.     Comments: No apparent tongue swelling. Patient reports inability to breath if she lays flat or touches her chin to her chest. Neck:     Comments: Submandibular swelling  Cardiovascular:     Rate and Rhythm: Normal rate and regular rhythm.  Neurological:     General: No focal deficit present.     Mental Status: She is alert and oriented to person, place, and time.      UC Treatments / Results  Labs (all labs ordered are listed, but only abnormal results are displayed) Labs Reviewed - No data to display  EKG   Radiology No results found.  Procedures Procedures (including critical care time)  Medications Ordered in UC Medications  dexamethasone (DECADRON) injection 10 mg (10 mg Intramuscular Given 01/20/20 0855)    Initial Impression / Assessment and Plan / UC Course  I have reviewed the triage vital signs and the nursing notes.  Pertinent labs & imaging results that were available during my care of the patient were reviewed by me and considered in my medical decision making (see chart for details).    Patient presents with facial and oropharynx edema.  Uvula appears edematous but without erythema.  Not believe strep throat is in differential as not have exudate or the strong bright  strawberry erythema that I would expect with strep throat.  Patient also has no recent sick contacts.  She does take her losartan at night but has not had any issues in the past.  We will give her a dose of Decadron here in the clinic and watch her for short period.   After about 1 hour post Decadron, patient continues to have same symptoms.  She reports not being to breathe if she lays back flat or if she touches her chin to her chest.  Given no real improvement and concerned that as patient is here by herself that worsening of her swelling could compromise her airway.  Patient needs to be evaluated in the ER for continued monitoring and ability to control her airway if needed.  I have given instructions to hold her losartan for now and added amlodipine 10 mg daily to replace.  We will have her follow-up with a primary care provider in regards to possible reaction to medication.  Final Clinical Impressions(s) / UC Diagnoses   Final diagnoses:  Facial swelling  Swollen uvula  Allergic reaction, initial encounter  Difficulty breathing     Discharge Instructions     -STOP taking your losartan nightly and exchanged for amlodipine 10 mg.  Do this until you can follow-up with your primary care provider regarding this possible reaction to your blood pressure medication -Given that she continued to edema with difficulty breathing based  on positioning, need to be evaluated the ER for monitoring and control of your airway if needed.  If you go home without being seen in the ER, there is a concern that your airway could be compromised and he will not be able to breathe if your swelling worsens    ED Prescriptions    Medication Sig Dispense Auth. Provider   amLODipine (NORVASC) 10 MG tablet Take 1 tablet (10 mg total) by mouth daily. 15 tablet Luvenia Redden, PA-C   methylPREDNISolone (MEDROL DOSEPAK) 4 MG TBPK tablet Take per package instructions 1 each Luvenia Redden, PA-C     PDMP not  reviewed this encounter.   Luvenia Redden, PA-C 01/20/20 1005

## 2020-01-20 NOTE — ED Triage Notes (Signed)
Patient is being discharged from the Urgent Care and sent to the Emergency Department via EMS . Per Raiford Simmonds, PA, patient is in need of higher level of care due to possible angioedema and difficulty breathing. Patient is aware and verbalizes understanding of plan of care.  Vitals:   01/20/20 0822  BP: (!) 160/88  Pulse: 85  Resp: 18  Temp: (!) 97.5 F (36.4 C)  SpO2: 98%

## 2020-01-20 NOTE — ED Triage Notes (Signed)
Patient in today c/o sore throat x this morning. Patient states she feels like something is struck in her throat and is having trouble breathing. Patient denies fever.

## 2020-01-20 NOTE — ED Provider Notes (Signed)
[facility] Emergency Department Provider Note  Time seen: 11:34 AM  I have reviewed the triage vital signs and the nursing notes.   HISTORY  Chief Complaint Allergic Reaction   HPI Tammy Young is a 54 y.o. female with a past medical history of anxiety, gastric reflux, hypertension, hyperlipidemia, presents to the emergency department for sensation of swelling in her throat sent from urgent care.  According to the patient she woke up this morning with sensation of swelling in her throat and difficulty breathing.  Went to urgent care was given 10 mg of IM Decadron and sent to the emergency department here the patient states it feels like the "thing in the back of my throat" is swollen.  Patient denies any fever.  Is not sure if she snores.  States it is very sore in the back of her throat.  Patient was satting 92% so EMS placed the patient on 2 L of oxygen.  Past Medical History:  Diagnosis Date  . Anxiety and depression   . Arthritis   . Fatty liver disease, nonalcoholic   . GERD (gastroesophageal reflux disease)   . Headache   . Herpes simplex vulvovaginitis   . History of kidney stones   . History of stomach ulcers   . HLD (hyperlipidemia)   . Hypertension   . Hypothyroid   . Hypothyroidism   . Liver disease   . Lymphedema of right lower extremity   . Morbid obesity ([place])   . Multiple gastric ulcers   . PONV (postoperative nausea and vomiting)   . Pre-diabetes   . 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  . Seizures ([place])    only had with preeclampsia during pregnancy at age 89. none since  . Sleep apnea   . Stress incontinence (female) (female)   . Vitamin B 12 deficiency   . Vitamin D deficiency     Patient Active Problem List   Diagnosis Date Noted  . 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 Chronic pain syndrome 10/22/2019  . Chronic bilateral low back pain without sciatica  10/22/2019  . SI (sacroiliac) joint inflammation ([place]) 10/22/2019  . Bilateral hip pain 10/22/2019  . Insect bite of left elbow 10/06/2019  . Pruritic rash 09/11/2019  . Osteopenia of multiple sites 08/31/2019  . Swelling of limb 08/28/2019  . Pain in limb 08/28/2019  . Varicose veins of leg with pain, right 08/20/2019  . Stroke-like symptoms 08/20/2019  . Prediabetes 08/18/2019  . Vitamin B12 deficiency 08/18/2019  . Vitamin D deficiency 08/18/2019  . Lymphedema of right lower extremity 08/14/2019  . Stress incontinence in female 08/14/2019  . Recurrent major depressive disorder, in partial remission ([place]) 01/14/2018  . Obstructive sleep apnea syndrome 10/18/2017  . Sialadenitis 10/17/2017  . SOB (shortness of breath) 09/15/2017  . Chronic cough 08/01/2017  . Obstructive uropathy 10/08/2016  . Ureterolithiasis   . Acute right flank pain   . Intractable pain   . Chronic renal disease, stage 3, moderately decreased glomerular filtration rate between 30-59 mL/min/1.73 square meter 07/02/2016  . Essential hypertension 06/22/2016  . Intractable chronic migraine without aura and with status migrainosus 12/02/2015  . Chronic daily headache 11/16/2015  . Tension headache 11/16/2015  . Chronic pansinusitis 10/24/2015  . Chronic tension-type headache, intractable 10/24/2015  . Hemorrhoids 10/24/2015  . Hyperlipidemia, unspecified 10/24/2015  . Herpesviral vulvovaginitis 11/17/2014  . Multiple gastric ulcers 04/16/2014  .  Class 3 severe obesity with serious comorbidity and body mass index (BMI) of 40.0 to 44.9 in adult ([place]) 03/17/2014  . Epigastric pain 03/17/2014  . Fatty (change of) liver, not elsewhere classified 03/17/2014  . Gastroesophageal reflux disease 03/17/2014  . Hypothyroidism, unspecified 10/23/2012  . Thoracic back pain 10/23/2012  . Myalgia, other site 10/08/2012  . Calculus of kidney 05/30/2011  . Carpal tunnel syndrome 05/30/2011    Past Surgical History:   Procedure Laterality Date  . BREAST SURGERY     reduction  . CARPAL TUNNEL RELEASE Bilateral   . CHOLECYSTECTOMY    . CYSTOSCOPY WITH URETEROSCOPY, STONE BASKETRY AND STENT PLACEMENT  10/09/2016   Procedure: CYSTOSCOPY WITH URETEROSCOPY, STONE BASKETRY AND STENT PLACEMENT;  Surgeon: Hollice Espy, MD;  Location: [facility] ORS;  Service: Urology;;  . DIRECT LARYNGOSCOPY Left 04/15/2015   Procedure: DIRECT LARYNGOSCOPY BIOPSY AND LASER LEFT TONGUE LESION;  Surgeon: Melissa Montane, MD;  Location: [facility];  Service: ENT;  Laterality: Left;  . ESOPHAGOGASTRODUODENOSCOPY (EGD) WITH PROPOFOL N/A 09/03/2019   Procedure: ESOPHAGOGASTRODUODENOSCOPY (EGD) WITH PROPOFOL;  Surgeon: Toledo, Benay Pike, MD;  Location: [facility] ENDOSCOPY;  Service: Gastroenterology;  Laterality: N/A;  . REDUCTION MAMMAPLASTY    . TONSILLECTOMY    . URETER SURGERY    . VAGINAL HYSTERECTOMY  2000    Prior to Admission medications   Medication Sig Start Date End Date Taking? Authorizing Provider  albuterol (PROVENTIL) (2.5 MG/3ML) 0.083% nebulizer solution Inhale into the lungs. 06/29/19 06/28/20  [provider]  amLODipine (NORVASC) 10 MG tablet Take 1 tablet (10 mg total) by mouth daily. 01/20/20   Luvenia Redden, PA-C  baclofen (LIORESAL) 10 MG tablet Take 1 tablet (10 mg total) by mouth 3 (three) times daily as needed. 10/03/18   Coral Spikes, DO  budesonide-formoterol [facility]) 160-4.5 MCG/ACT inhaler  08/16/19   [provider]  Cholecalciferol 50 MCG (2000 UT) CAPS Take 3 capsules daily for 3 months, then reduce to 1 capsule daily thereafter for Vitamin D Deficiency. 08/18/19   [provider]  Cyanocobalamin (VITAMIN B 12 PO) Take 5,000 mg by mouth.    [provider]  DULoxetine (CYMBALTA) 30 MG capsule Take 1 capsule by mouth daily. 01/14/18 01/20/20  [provider]  ergocalciferol (VITAMIN D2) 1.25 MG (50000 UT) capsule Take by mouth. 12/30/18   [provider]  gabapentin (NEURONTIN) 600 MG tablet Take 300 mg by mouth in the morning, at noon, and at bedtime. 01/06/20 01/05/21  [provider]  hydrochlorothiazide (HYDRODIURIL) 25 MG tablet Take 25 mg by mouth daily.     [provider]  levothyroxine (SYNTHROID, LEVOTHROID) 25 MCG tablet Take 25 mcg by mouth daily before breakfast.    [provider]  linaclotide (LINZESS) 72 MCG capsule Take 72 mcg by mouth daily before breakfast.    [provider]  losartan (COZAAR) 100 MG tablet Take 100 mg by mouth daily. 07/19/19   [provider]  Magnesium 500 MG CAPS Take 1 capsule (500 mg total) by mouth 2 (two) times daily at 8 am and 10 pm. 11/03/19 05/01/20  Gillis Santa, MD  meclizine (ANTIVERT) 25 MG tablet Take 25 mg by mouth 3 (three) times daily as needed for dizziness.    [provider]  methocarbamol (ROBAXIN) 750 MG tablet Take 750 mg by mouth 4 (four) times daily.    [provider]  methylPREDNISolone (MEDROL DOSEPAK) 4 MG TBPK tablet Take per package instructions 01/21/20  Luvenia Redden, PA-C  mirabegron ER (MYRBETRIQ) 25 MG TB24 tablet Take 25 mg by mouth daily. Patient not taking: Reported on 01/11/2020    [provider]  ondansetron (ZOFRAN-ODT) 4 MG disintegrating tablet Take 4 mg by mouth every 8 (eight) hours as needed for nausea or vomiting.    [provider]  pantoprazole (PROTONIX) 40 MG tablet Take 40 mg by mouth daily. 08/16/19   [provider]  rosuvastatin (CRESTOR) 10 MG tablet Take 10 mg by mouth daily.    [provider]  SUMAtriptan (IMITREX) 100 MG tablet TK 1 T PO ONCE PRF MIGRAINE. MAY TAKE 2ND DOSE AFTER 2 HOURS IF STILL NEEDED 03/12/19   [provider]  valACYclovir (VALTREX) 500 MG tablet TAKE ONE CAPLET BY MOUTH TWICE DAILY 10/10/15   [provider]  venlafaxine XR (EFFEXOR-XR) 75 MG 24 hr capsule Take 75 mg by mouth daily with breakfast.    [provider]  fexofenadine (ALLEGRA) 180 MG tablet Take 180 mg by mouth daily. Patient not taking: Reported on 01/11/2020 07/19/19 01/20/20  [provider]  RABEprazole (ACIPHEX) 20 MG tablet Take 20 mg by mouth daily. 07/19/19 01/20/20  [provider]  ranitidine (ZANTAC) 150 MG tablet Take 150 mg by mouth at bedtime. Patient not taking: Reported on 01/11/2020  01/20/20  [provider]    Allergies  Allergen Reactions  . Amoxicillin Rash  . Augmentin [Amoxicillin-Pot Clavulanate] Itching and Rash    Family History  Problem Relation Age of Onset  . Kidney Stones Mother   . Diabetes Mother   . COPD Mother   . Kidney Stones Father   . Migraines Maternal Grandmother   . Stroke Maternal Grandmother   . Diabetes Maternal Grandmother   . Prostate cancer Maternal Grandfather   . Kidney cancer Neg Hx   . Bladder Cancer Neg Hx     Social History Social History   Tobacco Use  . Smoking status: Passive Smoke Exposure - Never Smoker  . Smokeless tobacco: Never Used  . Tobacco comment: mother smokes outside/ mother deceased  Vaping Use  . Vaping Use: Never used  Substance Use Topics  . Alcohol use: Not Currently    Comment: rarely  . Drug use: No    Review of Systems Constitutional: Negative for fever ENT: Positive for sore throat and swelling. Cardiovascular: Negative for chest pain. Respiratory: Negative for shortness of breath. Gastrointestinal: Negative for abdominal pain, vomiting  Musculoskeletal: Negative for musculoskeletal complaints Neurological: Negative for headache All other ROS negative  ____________________________________________   PHYSICAL EXAM:  VITAL SIGNS: ED Triage Vitals  Enc Vitals Group     BP 01/20/20 1116 (!) 156/130     Pulse Rate 01/20/20 1116 72     Resp 01/20/20 1116 18     Temp 01/20/20 1116 97.8 F (36.6 C)     Temp Source 01/20/20 1116 Oral     SpO2 01/20/20 1116 98 %     Weight 01/20/20 1102 259 lb  14.8 oz (117.9 kg)     Height 01/20/20 1102 5\' 5"  (1.651 m)     Head Circumference --      Peak Flow --      Pain Score 01/20/20 1118 10     Pain Loc --      Pain Edu? --      Excl. in [place]? --    Constitutional: Alert and oriented. Well appearing and in no distress. Eyes: Normal exam ENT  Head: Normocephalic and atraumatic.      Mouth/Throat: Mucous membranes are moist. Swollen and edematous uvula with moderate erythema  Cardiovascular: Normal rate, regular rhythm.  Respiratory: Normal respiratory effort without tachypnea nor retractions. Breath sounds are clear  Gastrointestinal: Soft and nontender. No distention.   Musculoskeletal: Nontender with normal range of motion in all extremities. Neurologic:  Normal speech and language. No gross focal neurologic deficits  Skin:  Skin is warm, dry and intact.  Psychiatric: Mood and affect are normal.  ____________________________________________    INITIAL IMPRESSION / ASSESSMENT AND PLAN / ED COURSE  Pertinent labs & imaging results that were available during my care of the patient were reviewed by me and considered in my medical decision making (see chart for details).   Patient presents to the emergency department for swelling in her throat.  Patient's exam most consistent with uvulitis.  We will treat the patient with racemic epinephrine.  We will dose IV Benadryl we will dose 4 mg of IV Decadron as the patient received 10 mg of IM Decadron.  We will continue to closely monitor.  We will check for group A strep.  Strep is negative.  Patient's uvula has diminished in size significantly.  Patient is feeling better able to speak in full sentences.  Continued state soreness in the throat but felt much better after drinking cold water.  Swallowing without difficulty.  I discussed supportive care at home including Chloraseptic sprays and Cepacol lozenges.  We will cover with antibiotics as a precaution.  Patient will follow up with her  doctor.  Discussed return precautions.  Paizlie TYTIANA COLES was evaluated in Emergency Department on 01/20/2020 for the symptoms described in the history of present illness. She was evaluated in the context of the global COVID-19 pandemic, which necessitated consideration that the patient might be at risk for infection with the SARS-CoV-2 virus that causes COVID-19. Institutional protocols and algorithms that pertain to the evaluation of patients at risk for COVID-19 are in a state of rapid change based on information released by regulatory bodies including the CDC and federal and state organizations. These policies and algorithms were followed during the patient's care in the ED.  ____________________________________________   FINAL CLINICAL IMPRESSION(S) / ED DIAGNOSES  uvuliitis   Harvest Dark, MD 01/20/20 1437

## 2020-01-20 NOTE — Discharge Instructions (Addendum)
-  STOP taking your losartan nightly and exchanged for amlodipine 10 mg.  Do this until you can follow-up with your primary care provider regarding this possible reaction to your blood pressure medication -Given that she continued to edema with difficulty breathing based on positioning, need to be evaluated the ER for monitoring and control of your airway if needed.  If you go home without being seen in the ER, there is a concern that your airway could be compromised and he will not be able to breathe if your swelling worsens

## 2020-02-04 ENCOUNTER — Ambulatory Visit: Payer: 59

## 2020-02-04 ENCOUNTER — Ambulatory Visit: Admission: RE | Admit: 2020-02-04 | Payer: 59 | Source: Ambulatory Visit

## 2020-02-10 ENCOUNTER — Telehealth: Payer: Self-pay

## 2020-02-10 NOTE — Telephone Encounter (Signed)
Called patient to confirm Mondays appt and to review PP. No answer, Left message to call us back.

## 2020-02-15 ENCOUNTER — Other Ambulatory Visit: Payer: Self-pay | Admitting: Gerontology

## 2020-02-15 ENCOUNTER — Ambulatory Visit
Payer: 59 | Attending: Student in an Organized Health Care Education/Training Program | Admitting: Student in an Organized Health Care Education/Training Program

## 2020-02-15 ENCOUNTER — Other Ambulatory Visit: Payer: Self-pay

## 2020-02-15 DIAGNOSIS — R053 Chronic cough: Secondary | ICD-10-CM

## 2020-02-15 DIAGNOSIS — R0602 Shortness of breath: Secondary | ICD-10-CM

## 2020-02-15 DIAGNOSIS — M533 Sacrococcygeal disorders, not elsewhere classified: Secondary | ICD-10-CM

## 2020-02-15 NOTE — Progress Notes (Signed)
I attempted to call the patient however no response. Voicemail left instructing patient to call front desk office at [phone] to reschedule appointment. -Dr Yousaf Sainato  

## 2020-02-16 ENCOUNTER — Encounter: Payer: Self-pay | Admitting: Emergency Medicine

## 2020-02-16 ENCOUNTER — Ambulatory Visit
Admission: RE | Admit: 2020-02-16 | Discharge: 2020-02-16 | Disposition: A | Discharge status: Home / Self Care | Payer: 59 | Source: Ambulatory Visit | Attending: Gerontology | Admitting: Gerontology

## 2020-02-16 ENCOUNTER — Other Ambulatory Visit: Payer: Self-pay

## 2020-02-16 ENCOUNTER — Inpatient Hospital Stay
Admission: EM | Admit: 2020-02-16 | Discharge: 2020-02-18 | DRG: 176 | Disposition: A | Discharge status: Home / Self Care | Payer: 59 | Attending: Internal Medicine | Admitting: Internal Medicine

## 2020-02-16 ENCOUNTER — Inpatient Hospital Stay: Payer: 59

## 2020-02-16 ENCOUNTER — Inpatient Hospital Stay
Admit: 2020-02-16 | Discharge: 2020-02-16 | Disposition: A | Discharge status: Home / Self Care | Payer: 59 | Attending: Internal Medicine | Admitting: Internal Medicine

## 2020-02-16 DIAGNOSIS — Z7951 Long term (current) use of inhaled steroids: Secondary | ICD-10-CM

## 2020-02-16 DIAGNOSIS — M79661 Pain in right lower leg: Secondary | ICD-10-CM | POA: Diagnosis present

## 2020-02-16 DIAGNOSIS — E559 Vitamin D deficiency, unspecified: Secondary | ICD-10-CM | POA: Diagnosis present

## 2020-02-16 DIAGNOSIS — Z8711 Personal history of peptic ulcer disease: Secondary | ICD-10-CM

## 2020-02-16 DIAGNOSIS — Z880 Allergy status to penicillin: Secondary | ICD-10-CM

## 2020-02-16 DIAGNOSIS — Z79890 Hormone replacement therapy (postmenopausal): Secondary | ICD-10-CM

## 2020-02-16 DIAGNOSIS — R197 Diarrhea, unspecified: Secondary | ICD-10-CM | POA: Diagnosis present

## 2020-02-16 DIAGNOSIS — F419 Anxiety disorder, unspecified: Secondary | ICD-10-CM | POA: Diagnosis present

## 2020-02-16 DIAGNOSIS — R05 Cough: Secondary | ICD-10-CM | POA: Insufficient documentation

## 2020-02-16 DIAGNOSIS — Z20822 Contact with and (suspected) exposure to covid-19: Secondary | ICD-10-CM | POA: Diagnosis present

## 2020-02-16 DIAGNOSIS — F329 Major depressive disorder, single episode, unspecified: Secondary | ICD-10-CM | POA: Diagnosis present

## 2020-02-16 DIAGNOSIS — I2699 Other pulmonary embolism without acute cor pulmonale: Secondary | ICD-10-CM | POA: Diagnosis present

## 2020-02-16 DIAGNOSIS — Z825 Family history of asthma and other chronic lower respiratory diseases: Secondary | ICD-10-CM

## 2020-02-16 DIAGNOSIS — E039 Hypothyroidism, unspecified: Secondary | ICD-10-CM | POA: Diagnosis present

## 2020-02-16 DIAGNOSIS — Z823 Family history of stroke: Secondary | ICD-10-CM

## 2020-02-16 DIAGNOSIS — J440 Chronic obstructive pulmonary disease with acute lower respiratory infection: Secondary | ICD-10-CM | POA: Diagnosis present

## 2020-02-16 DIAGNOSIS — N393 Stress incontinence (female) (male): Secondary | ICD-10-CM | POA: Diagnosis present

## 2020-02-16 DIAGNOSIS — G4733 Obstructive sleep apnea (adult) (pediatric): Secondary | ICD-10-CM | POA: Diagnosis present

## 2020-02-16 DIAGNOSIS — E785 Hyperlipidemia, unspecified: Secondary | ICD-10-CM | POA: Diagnosis present

## 2020-02-16 DIAGNOSIS — I10 Essential (primary) hypertension: Secondary | ICD-10-CM | POA: Diagnosis present

## 2020-02-16 DIAGNOSIS — E538 Deficiency of other specified B group vitamins: Secondary | ICD-10-CM | POA: Diagnosis present

## 2020-02-16 DIAGNOSIS — I493 Ventricular premature depolarization: Secondary | ICD-10-CM | POA: Diagnosis present

## 2020-02-16 DIAGNOSIS — R053 Chronic cough: Secondary | ICD-10-CM

## 2020-02-16 DIAGNOSIS — J209 Acute bronchitis, unspecified: Secondary | ICD-10-CM | POA: Diagnosis present

## 2020-02-16 DIAGNOSIS — K219 Gastro-esophageal reflux disease without esophagitis: Secondary | ICD-10-CM | POA: Diagnosis present

## 2020-02-16 DIAGNOSIS — R0602 Shortness of breath: Secondary | ICD-10-CM

## 2020-02-16 DIAGNOSIS — A770 Spotted fever due to Rickettsia rickettsii: Secondary | ICD-10-CM | POA: Diagnosis present

## 2020-02-16 DIAGNOSIS — Z8042 Family history of malignant neoplasm of prostate: Secondary | ICD-10-CM

## 2020-02-16 DIAGNOSIS — Z79899 Other long term (current) drug therapy: Secondary | ICD-10-CM

## 2020-02-16 DIAGNOSIS — I82409 Acute embolism and thrombosis of unspecified deep veins of unspecified lower extremity: Secondary | ICD-10-CM

## 2020-02-16 DIAGNOSIS — Z87442 Personal history of urinary calculi: Secondary | ICD-10-CM

## 2020-02-16 DIAGNOSIS — Z6841 Body Mass Index (BMI) 40.0 and over, adult: Secondary | ICD-10-CM | POA: Diagnosis not present

## 2020-02-16 DIAGNOSIS — Z833 Family history of diabetes mellitus: Secondary | ICD-10-CM

## 2020-02-16 DIAGNOSIS — R519 Headache, unspecified: Secondary | ICD-10-CM | POA: Diagnosis present

## 2020-02-16 HISTORY — DX: Systemic involvement of connective tissue, unspecified: M35.9

## 2020-02-16 LAB — CBC WITH DIFFERENTIAL/PLATELET
Abs Immature Granulocytes: 0.03 10*3/uL (ref 0.00–0.07)
Basophils Absolute: 0 10*3/uL (ref 0.0–0.1)
Basophils Relative: 0 %
Eosinophils Absolute: 0.1 10*3/uL (ref 0.0–0.5)
Eosinophils Relative: 2 %
HCT: 38.1 % (ref 36.0–46.0)
Hemoglobin: 13.1 g/dL (ref 12.0–15.0)
Immature Granulocytes: 0 %
Lymphocytes Relative: 45 %
Lymphs Abs: 3.1 10*3/uL (ref 0.7–4.0)
MCH: 32.7 pg (ref 26.0–34.0)
MCHC: 34.4 g/dL (ref 30.0–36.0)
MCV: 95 fL (ref 80.0–100.0)
Monocytes Absolute: 0.8 10*3/uL (ref 0.1–1.0)
Monocytes Relative: 12 %
Neutro Abs: 2.8 10*3/uL (ref 1.7–7.7)
Neutrophils Relative %: 41 %
Platelets: 219 10*3/uL (ref 150–400)
RBC: 4.01 MIL/uL (ref 3.87–5.11)
RDW: 13.3 % (ref 11.5–15.5)
WBC: 6.9 10*3/uL (ref 4.0–10.5)
nRBC: 0 % (ref 0.0–0.2)

## 2020-02-16 LAB — COMPREHENSIVE METABOLIC PANEL
ALT: 35 U/L (ref 0–44)
AST: 26 U/L (ref 15–41)
Albumin: 4 g/dL (ref 3.5–5.0)
Alkaline Phosphatase: 45 U/L (ref 38–126)
Anion gap: 9 (ref 5–15)
BUN: 12 mg/dL (ref 6–20)
CO2: 24 mmol/L (ref 22–32)
Calcium: 9 mg/dL (ref 8.9–10.3)
Chloride: 105 mmol/L (ref 98–111)
Creatinine, Ser: 0.74 mg/dL (ref 0.44–1.00)
GFR calc Af Amer: >60 mL/min (ref >60)
GFR calc non Af Amer: >60 mL/min (ref >60)
Glucose, Bld: 96 mg/dL (ref 70–99)
Potassium: 3.5 mmol/L (ref 3.5–5.1)
Sodium: 138 mmol/L (ref 135–145)
Total Bilirubin: 0.8 mg/dL (ref 0.3–1.2)
Total Protein: 7 g/dL (ref 6.5–8.1)

## 2020-02-16 LAB — APTT: aPTT: 27 seconds (ref 24–36)

## 2020-02-16 LAB — TROPONIN I (HIGH SENSITIVITY)
Troponin I (High Sensitivity): 4 ng/L (ref <18)
Troponin I (High Sensitivity): 4 ng/L (ref <18)

## 2020-02-16 LAB — SARS CORONAVIRUS 2 BY RT PCR (HOSPITAL ORDER, PERFORMED IN ~~LOC~~ HOSPITAL LAB): SARS Coronavirus 2: NEGATIVE

## 2020-02-16 LAB — MAGNESIUM: Magnesium: 2.1 mg/dL (ref 1.7–2.4)

## 2020-02-16 LAB — PROTIME-INR
INR: 1 (ref 0.8–1.2)
Prothrombin Time: 12.6 seconds (ref 11.4–15.2)

## 2020-02-16 LAB — HEPARIN LEVEL (UNFRACTIONATED): Heparin Unfractionated: 0.71 IU/mL — ABNORMAL HIGH (ref 0.30–0.70)

## 2020-02-16 MED ORDER — VALACYCLOVIR HCL 500 MG PO TABS
500.0000 mg | ORAL_TABLET | Freq: Two times a day (BID) | ORAL | Status: DC
  Administered 2020-02-16 – 2020-02-18 (×4): 500 mg via ORAL
  Filled 2020-02-16 (×5): qty 1

## 2020-02-16 MED ORDER — MOMETASONE FURO-FORMOTEROL FUM 200-5 MCG/ACT IN AERO
2.0000 | INHALATION_SPRAY | Freq: Two times a day (BID) | RESPIRATORY_TRACT | Status: DC
  Administered 2020-02-17 – 2020-02-18 (×3): 2 via RESPIRATORY_TRACT
  Filled 2020-02-16 (×2): qty 8.8

## 2020-02-16 MED ORDER — HEPARIN BOLUS VIA INFUSION
4250.0000 [IU] | Freq: Once | INTRAVENOUS | Status: AC
  Administered 2020-02-16: 4250 [IU] via INTRAVENOUS
  Filled 2020-02-16: qty 4250

## 2020-02-16 MED ORDER — SODIUM CHLORIDE 0.9% FLUSH
3.0000 mL | Freq: Two times a day (BID) | INTRAVENOUS | Status: DC
  Administered 2020-02-16 – 2020-02-18 (×4): 3 mL via INTRAVENOUS

## 2020-02-16 MED ORDER — HYDROCODONE-ACETAMINOPHEN 5-325 MG PO TABS
1.0000 | ORAL_TABLET | Freq: Four times a day (QID) | ORAL | Status: DC | PRN
  Administered 2020-02-16 – 2020-02-18 (×4): 1 via ORAL
  Filled 2020-02-16 (×4): qty 1

## 2020-02-16 MED ORDER — MOMETASONE FURO-FORMOTEROL FUM 200-5 MCG/ACT IN AERO: 2.0000 | INHALATION_SPRAY | Freq: Two times a day (BID) | RESPIRATORY_TRACT | Status: DC

## 2020-02-16 MED ORDER — MAGNESIUM OXIDE 400 (241.3 MG) MG PO TABS
400.0000 mg | ORAL_TABLET | Freq: Two times a day (BID) | ORAL | Status: DC
  Administered 2020-02-16 – 2020-02-17 (×3): 400 mg via ORAL
  Filled 2020-02-16 (×3): qty 1

## 2020-02-16 MED ORDER — ROSUVASTATIN CALCIUM 10 MG PO TABS
10.0000 mg | ORAL_TABLET | Freq: Every day | ORAL | Status: DC
  Administered 2020-02-17 – 2020-02-18 (×2): 10 mg via ORAL
  Filled 2020-02-16 (×2): qty 1

## 2020-02-16 MED ORDER — VENLAFAXINE HCL ER 75 MG PO CP24
75.0000 mg | ORAL_CAPSULE | Freq: Every day | ORAL | Status: DC
  Administered 2020-02-18: 75 mg via ORAL
  Filled 2020-02-16 (×3): qty 1

## 2020-02-16 MED ORDER — MECLIZINE HCL 25 MG PO TABS
25.0000 mg | ORAL_TABLET | Freq: Three times a day (TID) | ORAL | Status: DC | PRN
  Filled 2020-02-16: qty 1

## 2020-02-16 MED ORDER — AMLODIPINE BESYLATE 10 MG PO TABS
10.0000 mg | ORAL_TABLET | Freq: Every day | ORAL | Status: DC
  Administered 2020-02-16 – 2020-02-18 (×3): 10 mg via ORAL
  Filled 2020-02-16: qty 1
  Filled 2020-02-16 (×2): qty 2

## 2020-02-16 MED ORDER — IOHEXOL 350 MG/ML SOLN
100.0000 mL | Freq: Once | INTRAVENOUS | Status: AC | PRN
  Administered 2020-02-16: 100 mL via INTRAVENOUS

## 2020-02-16 MED ORDER — ONDANSETRON HCL 4 MG/2ML IJ SOLN: 4.0000 mg | Freq: Four times a day (QID) | INTRAMUSCULAR | Status: DC | PRN

## 2020-02-16 MED ORDER — VITAMIN D (ERGOCALCIFEROL) 1.25 MG (50000 UNIT) PO CAPS: 50000.0000 [IU] | ORAL_CAPSULE | ORAL | Status: DC

## 2020-02-16 MED ORDER — VITAMIN B-12 1000 MCG PO TABS
5000.0000 ug | ORAL_TABLET | Freq: Every day | ORAL | Status: DC
  Administered 2020-02-17: 5000 ug via ORAL
  Filled 2020-02-16: qty 5

## 2020-02-16 MED ORDER — PANTOPRAZOLE SODIUM 40 MG PO TBEC
40.0000 mg | DELAYED_RELEASE_TABLET | Freq: Every day | ORAL | Status: DC
  Administered 2020-02-16 – 2020-02-18 (×3): 40 mg via ORAL
  Filled 2020-02-16 (×3): qty 1

## 2020-02-16 MED ORDER — ACETAMINOPHEN 650 MG RE SUPP: 650.0000 mg | Freq: Four times a day (QID) | RECTAL | Status: DC | PRN

## 2020-02-16 MED ORDER — LOSARTAN POTASSIUM 50 MG PO TABS
100.0000 mg | ORAL_TABLET | Freq: Every day | ORAL | Status: DC
  Administered 2020-02-17: 100 mg via ORAL
  Filled 2020-02-16 (×2): qty 2

## 2020-02-16 MED ORDER — ONDANSETRON HCL 4 MG PO TABS: 4.0000 mg | ORAL_TABLET | Freq: Four times a day (QID) | ORAL | Status: DC | PRN

## 2020-02-16 MED ORDER — SUMATRIPTAN SUCCINATE 50 MG PO TABS
100.0000 mg | ORAL_TABLET | ORAL | Status: DC | PRN
  Filled 2020-02-16 (×2): qty 2

## 2020-02-16 MED ORDER — HEPARIN (PORCINE) 25000 UT/250ML-% IV SOLN
1250.0000 [IU]/h | INTRAVENOUS | Status: DC
  Administered 2020-02-16: 1350 [IU]/h via INTRAVENOUS
  Administered 2020-02-17: 1250 [IU]/h via INTRAVENOUS
  Filled 2020-02-16 (×2): qty 250

## 2020-02-16 MED ORDER — DULOXETINE HCL 30 MG PO CPEP
30.0000 mg | ORAL_CAPSULE | Freq: Every day | ORAL | Status: DC
  Administered 2020-02-17: 30 mg via ORAL
  Filled 2020-02-16: qty 1

## 2020-02-16 MED ORDER — ACETAMINOPHEN 325 MG PO TABS
650.0000 mg | ORAL_TABLET | Freq: Four times a day (QID) | ORAL | Status: DC | PRN
  Administered 2020-02-16: 650 mg via ORAL
  Filled 2020-02-16 (×2): qty 2

## 2020-02-16 MED ORDER — SODIUM CHLORIDE 0.9 % IV SOLN: 250.0000 mL | INTRAVENOUS | Status: DC | PRN

## 2020-02-16 MED ORDER — ALBUTEROL SULFATE (2.5 MG/3ML) 0.083% IN NEBU: 2.5000 mg | INHALATION_SOLUTION | RESPIRATORY_TRACT | Status: DC | PRN

## 2020-02-16 MED ORDER — SODIUM CHLORIDE 0.9% FLUSH: 3.0000 mL | INTRAVENOUS | Status: DC | PRN

## 2020-02-16 MED ORDER — MIRABEGRON ER 25 MG PO TB24
25.0000 mg | ORAL_TABLET | Freq: Every day | ORAL | Status: DC
  Administered 2020-02-17: 25 mg via ORAL
  Filled 2020-02-16 (×2): qty 1

## 2020-02-16 MED ORDER — HYDROCHLOROTHIAZIDE 25 MG PO TABS
25.0000 mg | ORAL_TABLET | Freq: Every day | ORAL | Status: DC
  Administered 2020-02-16 – 2020-02-18 (×3): 25 mg via ORAL
  Filled 2020-02-16 (×3): qty 1

## 2020-02-16 MED ORDER — AZITHROMYCIN 250 MG PO TABS
500.0000 mg | ORAL_TABLET | Freq: Every day | ORAL | Status: DC
  Administered 2020-02-16 – 2020-02-17 (×2): 500 mg via ORAL
  Filled 2020-02-16 (×2): qty 2
  Filled 2020-02-16: qty 1

## 2020-02-16 MED ORDER — LINACLOTIDE 72 MCG PO CAPS
72.0000 ug | ORAL_CAPSULE | Freq: Every day | ORAL | Status: DC
  Administered 2020-02-17: 72 ug via ORAL
  Filled 2020-02-16: qty 1

## 2020-02-16 MED ORDER — LEVOTHYROXINE SODIUM 50 MCG PO TABS
25.0000 ug | ORAL_TABLET | Freq: Every day | ORAL | Status: DC
  Administered 2020-02-17: 25 ug via ORAL
  Filled 2020-02-16: qty 1
  Filled 2020-02-16: qty 0.5
  Filled 2020-02-16 (×2): qty 1

## 2020-02-16 MED ORDER — BACLOFEN 10 MG PO TABS
10.0000 mg | ORAL_TABLET | Freq: Three times a day (TID) | ORAL | Status: DC | PRN
  Filled 2020-02-16: qty 1

## 2020-02-16 NOTE — ED Provider Notes (Signed)
[facility] Emergency Department Provider Note  ____________________________________________   First MD Initiated Contact with Patient 02/16/20 1317     (approximate)  I have reviewed the triage vital signs and the nursing notes.   HISTORY  Chief Complaint pulmonary embolism   HPI Tammy Young is a 54 y.o. 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  In addition to worsening chest pain and shortness of breath patient also states that she has had diarrhea for the last 2 weeks nausea, intermittent fevers, and headaches.  She denies any hemoptysis, recent injuries, rash, focal extremity weakness numbness or paresthesias or other acute complaints.  She does endorse some pain in her right calf which has been present for several months but seems to be worse the last several weeks.  No prior similar episodes.  No clear alleviating factors are well aggravating factors include any exertion.  Patient denies any history of DVT/PE.  Denies any tobacco use, history of malignancy, recent surgery, estrogen supplementation, or other clear PE risk factors.  She is not on any blood thinners and has no known bleeding or clotting disorders.         Past Medical History:  Diagnosis Date  . Anxiety and depression   . Arthritis   . Collagen vascular disease (HCC)    RA  . Fatty liver disease, nonalcoholic   . GERD (gastroesophageal reflux disease)   . Headache   . Herpes simplex vulvovaginitis   . History of kidney stones   . History of stomach ulcers   . HLD (hyperlipidemia)   . Hypertension   . Hypothyroid   . Hypothyroidism   . Liver disease    . Lymphedema of right lower extremity   . Morbid obesity ([place])   . Multiple gastric ulcers   . PONV (postoperative nausea and vomiting)   . Pre-diabetes   . 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  . Seizures ([place])    only had with preeclampsia during pregnancy at age 89. none since  . Sleep apnea   . Stress incontinence (female) (female)   . Vitamin B 12 deficiency   . Vitamin D deficiency     Patient Active Problem List   Diagnosis Date Noted  . 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 Chronic pain syndrome 10/22/2019  . Chronic bilateral low back pain without sciatica 10/22/2019  . SI (sacroiliac) joint inflammation ([place]) 10/22/2019  . Bilateral hip pain 10/22/2019  . Insect bite of left elbow 10/06/2019  . Pruritic rash 09/11/2019  . Osteopenia of multiple sites 08/31/2019  . Swelling of limb 08/28/2019  . Pain in limb 08/28/2019  . Varicose veins of leg with pain, right 08/20/2019  . Stroke-like symptoms 08/20/2019  . Prediabetes 08/18/2019  . Vitamin B12 deficiency 08/18/2019  . Vitamin D deficiency 08/18/2019  . Lymphedema of right lower extremity 08/14/2019  . Stress incontinence in female 08/14/2019  . Recurrent major depressive disorder, in partial remission ([place]) 01/14/2018  . Obstructive sleep apnea syndrome 10/18/2017  . Sialadenitis 10/17/2017  . SOB (shortness of breath) 09/15/2017  . Chronic cough 08/01/2017  . Obstructive uropathy 10/08/2016  .  Ureterolithiasis   . Acute right flank pain   . Intractable pain   . Chronic renal disease, stage 3, moderately decreased glomerular filtration rate between 30-59 mL/min/1.73 square meter 07/02/2016  . Essential hypertension 06/22/2016  . Intractable chronic migraine without aura and with status migrainosus 12/02/2015  . Chronic daily headache 11/16/2015  . Tension headache 11/16/2015  . Chronic pansinusitis 10/24/2015  . Chronic tension-type  headache, intractable 10/24/2015  . Hemorrhoids 10/24/2015  . Hyperlipidemia, unspecified 10/24/2015  . Herpesviral vulvovaginitis 11/17/2014  . Multiple gastric ulcers 04/16/2014  . Class 3 severe obesity with serious comorbidity and body mass index (BMI) of 40.0 to 44.9 in adult ([place]) 03/17/2014  . Epigastric pain 03/17/2014  . Fatty (change of) liver, not elsewhere classified 03/17/2014  . Gastroesophageal reflux disease 03/17/2014  . Hypothyroidism, unspecified 10/23/2012  . Thoracic back pain 10/23/2012  . Myalgia, other site 10/08/2012  . Calculus of kidney 05/30/2011  . Carpal tunnel syndrome 05/30/2011    Past Surgical History:  Procedure Laterality Date  . BREAST SURGERY     reduction  . CARPAL TUNNEL RELEASE Bilateral   . CHOLECYSTECTOMY    . CYSTOSCOPY WITH URETEROSCOPY, STONE BASKETRY AND STENT PLACEMENT  10/09/2016   Procedure: CYSTOSCOPY WITH URETEROSCOPY, STONE BASKETRY AND STENT PLACEMENT;  Surgeon: Hollice Espy, MD;  Location: [facility] OR[facility];  Service: Urology;;  . DIRECT LARYNGOSCOPY Left 04/15/2015   Procedure: DIRECT LARYNGOSCOPY BIOPSY AND LASER LEFT TONGUE LESION;  Surgeon: Melissa Montane, MD;  Location: [place];  Service: ENT;  Laterality: Left;  . ESOPHAGOGASTRODUODENOSCOPY (EGD) WITH PROPOFOL N/A 09/03/2019   Procedure: ESOPHAGOGASTRODUODENOSCOPY (EGD) WITH PROPOFOL;  Surgeon: Toledo, Benay Pike, MD;  Location: [facility];  Service: Gastroenterology;  Laterality: N/A;  . REDUCTION MAMMAPLASTY    . TONSILLECTOMY    . URETER SURGERY    . VAGINAL HYSTERECTOMY  2000    Prior to Admission medications   Medication Sig Start Date End Date Taking? Authorizing Provider  DULoxetine (CYMBALTA) 30 MG capsule Take 1 capsule by mouth daily. 01/14/18 02/16/20 Yes [provider]  albuterol (PROVENTIL) (2.5 MG/3ML) 0.083% nebulizer solution Inhale 2.5 mg into the lungs every 4 (four) hours as needed for wheezing or shortness of breath.  06/29/19 06/28/20   [provider]  amLODipine (NORVASC) 10 MG tablet Take 1 tablet (10 mg total) by mouth daily. 01/20/20   Luvenia Redden, PA-C  baclofen (LIORESAL) 10 MG tablet Take 1 tablet (10 mg total) by mouth 3 (three) times daily as needed. 10/03/18   Coral Spikes, DO  budesonide-formoterol (SYMBICORT) 160-4.5 MCG/ACT inhaler Inhale 2 puffs into the lungs daily.  08/16/19   [provider]  Cholecalciferol 50 MCG (2000 UT) CAPS Take 3 capsules daily for 3 months, then reduce to 1 capsule daily thereafter for Vitamin D Deficiency. 08/18/19   [provider]  Cyanocobalamin (VITAMIN B 12 PO) Take 5,000 mg by mouth.    [provider]  ergocalciferol (VITAMIN D2) 1.25 MG (50000 UT) capsule Take by mouth. 12/30/18   [provider]  gabapentin (NEURONTIN) 600 MG tablet Take 300 mg by mouth in the morning, at noon, and at bedtime. 01/06/20 01/05/21  [provider]  hydrochlorothiazide (HYDRODIURIL) 25 MG tablet Take 25 mg by mouth daily.     [provider]  levothyroxine (SYNTHROID, LEVOTHROID) 25 MCG tablet Take 25 mcg by mouth daily before breakfast.    [provider]  linaclotide (LINZESS) 72 MCG capsule Take 72 mcg by mouth  daily before breakfast.    [provider]  losartan (COZAAR) 100 MG tablet Take 100 mg by mouth daily. 07/19/19   [provider]  Magnesium 500 MG CAPS Take 1 capsule (500 mg total) by mouth 2 (two) times daily at 8 am and 10 pm. 11/03/19 05/01/20  Gillis Santa, MD  meclizine (ANTIVERT) 25 MG tablet Take 25 mg by mouth 3 (three) times daily as needed for dizziness.    [provider]  methocarbamol (ROBAXIN) 750 MG tablet Take 750 mg by mouth 4 (four) times daily.    [provider]  methylPREDNISolone (MEDROL DOSEPAK) 4 MG TBPK tablet Take per package instructions 01/21/20   Luvenia Redden, PA-C  mirabegron ER (MYRBETRIQ) 25 MG TB24 tablet Take 25 mg by mouth daily. Patient not  taking: Reported on 01/11/2020    [provider]  ondansetron (ZOFRAN-ODT) 4 MG disintegrating tablet Take 4 mg by mouth every 8 (eight) hours as needed for nausea or vomiting.    [provider]  pantoprazole (PROTONIX) 40 MG tablet Take 40 mg by mouth daily. 08/16/19   [provider]  rosuvastatin (CRESTOR) 10 MG tablet Take 10 mg by mouth daily.    [provider]  SUMAtriptan (IMITREX) 100 MG tablet Take 100 mg by mouth every 2 (two) hours as needed.  03/12/19   [provider]  valACYclovir (VALTREX) 500 MG tablet Take 500 mg by mouth 2 (two) times daily.  10/10/15   [provider]  venlafaxine XR (EFFEXOR-XR) 75 MG 24 hr capsule Take 75 mg by mouth daily with breakfast.    [provider]  fexofenadine (ALLEGRA) 180 MG tablet Take 180 mg by mouth daily. Patient not taking: Reported on 01/11/2020 07/19/19 01/20/20  [provider]  RABEprazole (ACIPHEX) 20 MG tablet Take 20 mg by mouth daily. 07/19/19 01/20/20  [provider]  ranitidine (ZANTAC) 150 MG tablet Take 150 mg by mouth at bedtime. Patient not taking: Reported on 01/11/2020  01/20/20  [provider]    Allergies Amoxicillin and Augmentin [amoxicillin-pot clavulanate]  Family History  Problem Relation Age of Onset  . Kidney Stones Mother   . Diabetes Mother   . COPD Mother   . Kidney Stones Father   . Migraines Maternal Grandmother   . Stroke Maternal Grandmother   . Diabetes Maternal Grandmother   . Prostate cancer Maternal Grandfather   . Kidney cancer Neg Hx   . Bladder Cancer Neg Hx     Social History Social History   Tobacco Use  . Smoking status: Passive Smoke Exposure - Never Smoker  . Smokeless tobacco: Never Used  . Tobacco comment: mother smokes outside/ mother deceased  Vaping Use  . Vaping Use: Never used  Substance Use Topics  . Alcohol use: Not Currently    Comment: rarely  . Drug use: No    Review of  Systems  Review of Systems  Constitutional: Positive for fever and malaise/fatigue. Negative for chills.  HENT: Negative for sore throat.   Eyes: Negative for pain.  Respiratory: Positive for shortness of breath. Negative for cough and stridor.   Cardiovascular: Positive for chest pain.  Gastrointestinal: Positive for diarrhea, nausea and vomiting.  Skin: Negative for rash.  Neurological: Negative for seizures, loss of consciousness and headaches.  Psychiatric/Behavioral: Negative for suicidal ideas.  All other systems reviewed and are negative.     ____________________________________________   PHYSICAL EXAM:  VITAL SIGNS: ED Triage Vitals  Enc Vitals Group  BP      Pulse      Resp      Temp      Temp src      SpO2      Weight      Height      Head Circumference      Peak Flow      Pain Score      Pain Loc      Pain Edu?      Excl. in [place]?    Vitals:   02/16/20 1323 02/16/20 1500  BP:  (!) 106/95  Pulse:  99  Resp:  19  Temp: 98.3 F (36.8 C)   SpO2:  97%   Physical Exam Vitals and nursing note reviewed.  Constitutional:      General: She is not in acute distress.    Appearance: She is well-developed.  HENT:     Head: Normocephalic and atraumatic.     Right Ear: External ear normal.     Left Ear: External ear normal.     Nose: Nose normal.     Mouth/Throat:     Mouth: Mucous membranes are moist.  Eyes:     Conjunctiva/sclera: Conjunctivae normal.  Cardiovascular:     Rate and Rhythm: Normal rate and regular rhythm.     Pulses: Normal pulses.     Heart sounds: No murmur heard.   Pulmonary:     Effort: Pulmonary effort is normal. Tachypnea present. No respiratory distress.     Breath sounds: Normal breath sounds.  Abdominal:     Palpations: Abdomen is soft.     Tenderness: There is no abdominal tenderness.  Musculoskeletal:     Cervical back: Neck supple.  Skin:    General: Skin is warm and dry.     Capillary Refill: Capillary refill takes  less than 2 seconds.  Neurological:     Mental Status: She is alert and oriented to person, place, and time.      ____________________________________________   LABS (all labs ordered are listed, but only abnormal results are displayed)  Labs Reviewed  SARS CORONAVIRUS 2 BY RT PCR ([facility])  CBC WITH DIFFERENTIAL/PLATELET  COMPREHENSIVE METABOLIC PANEL  MAGNESIUM  PROTIME-INR  APTT  PROTIME-INR  HEPARIN LEVEL (UNFRACTIONATED)  TROPONIN I (HIGH SENSITIVITY)  TROPONIN I (HIGH SENSITIVITY)   ____________________________________________  EKG  Sinus rhythm with a ventricular rate of 90, PVCs, nonspecific T wave changes in lead V2 otherwise no clear evidence of acute ischemia.  Normal axis and unremarkable intervals. ____________________________________________  RADIOLOGY  ED MD interpretation: Findings concerning for bilateral PEs without evidence of cor pulmonale.  Official radiology report(s): 1. Multiple pulmonary emboli bilaterally, more severe on the right than on the left. No appreciable right heart strain.  2. Atelectatic change in the lung bases. No parenchymal lung edema or consolidation.  3.  No evident adenopathy.  4.  Hepatic steatosis.  5.  Gallbladder absent.  ____________________________________________   PROCEDURES  Procedure(s) performed (including Critical Care):  Procedures   ____________________________________________   INITIAL IMPRESSION / ASSESSMENT AND PLAN / ED COURSE        I reviewed patient's CT obtained earlier today in outpatient setting which is noted above and is concerning for bilateral pulmonary emboli without evidence of right heart strain.  No clear evidence of pneumonia, pneumothorax, significant edema, or other acute intrathoracic process although there is some minor atelectasis noted in the bases.  On arrival patient  is noted to be slightly tachypneic at 26 with  borderline SPO2 saturations of 93%.  Heparin started in the ED. ACS, dissection, or other acute thoracic process causing patient's chest pain or shortness of breath.  Will plan to admit to hospital service for further evaluation management.      ____________________________________________   FINAL CLINICAL IMPRESSION(S) / ED DIAGNOSES  Final diagnoses:  Acute pulmonary embolism without acute cor pulmonale, unspecified pulmonary embolism type [facility])     ED Discharge Orders    None       Note:  This document was prepared using Dragon voice recognition software and may include unintentional dictation errors.   Lucrezia Starch, MD 02/16/20 [id number]

## 2020-02-16 NOTE — ED Notes (Signed)
Pt ambulated around room with no assistance. Oxygen saturation remained at 97% on RA, pt c/o of SOB while ambulating. Pt returned to bed with no issue. EDP made aware of findings.

## 2020-02-16 NOTE — ED Triage Notes (Signed)
Has had some chest pain and SHOB. Went to pcp and ordered outpatient CT. Pt sent for multiple bilateral PE

## 2020-02-16 NOTE — H&P (Signed)
History and Physical    Tammy Young UJW:[id number] DOB: 04/22/1966 DOA: 02/16/2020  PCP: Toni Arthurs, NP   Patient coming from: Home  I have personally briefly reviewed patient's old medical records in [place]  Chief Complaint: Shortness of breath  HPI: Tammy Young is a 54 y.o. female with medical history significant for COPD, hypertension, anxiety disorder, morbid obesity, obstructive sleep apnea, hypothyroidism and recent diagnosis of [facility] spotted fever on doxycycline who presents to the emergency room for evaluation of a 1 month history of worsening shortness of breath mostly with exertion associated with chest pain.  She also complains of a cough which is productive of yellow phlegm mostly in the mornings.  She denies having any fever chills or any sick contacts.  Patient also complains of diarrhea which she has had for about 2 weeks and has stools have been tested for C. difficile which came back negative.  She also complains of right calf swelling but denies having any pain.  She denies nicotine use and does not have any history of malignancy.  She denies recent surgery and is not on estrogen supplementation.  Due to worsening of her symptoms she had a CT angiogram done as an outpatient which showed multiple pulmonary emboli bilaterally, more severe on the right than on the left. No appreciable right heart strain. Twelve-lead EKG shows sinus rhythm with PVCs She will be admitted to the hospital for further evaluation   ED Course: Patient is a 54 year old female who presents to the emergency room for a 1 month history of shortness of breath which has progressively worsened and is associated with chest pain and a cough productive of yellow phlegm mostly in the mornings.  Patient had a CT angiogram which showed multiple pulmonary emboli.  She was started on heparin drip in the ER and will be admitted to the hospital for further evaluation.  Review of Systems: As  per HPI otherwise 10 point review of systems negative.    Past Medical History:  Diagnosis Date  . Anxiety and depression   . Arthritis   . Collagen vascular disease (HCC)    RA  . Fatty liver disease, nonalcoholic   . GERD (gastroesophageal reflux disease)   . Headache   . Herpes simplex vulvovaginitis   . History of kidney stones   . History of stomach ulcers   . HLD (hyperlipidemia)   . Hypertension   . Hypothyroid   . Hypothyroidism   . Liver disease   . Lymphedema of right lower extremity   . Morbid obesity ([place])   . Multiple gastric ulcers   . PONV (postoperative nausea and vomiting)   . Pre-diabetes   . 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  . Seizures ([place])    only had with preeclampsia during pregnancy at age 80. none since  . Sleep apnea   . Stress incontinence (female) (female)   . Vitamin B 12 deficiency   . Vitamin D deficiency     Past Surgical History:  Procedure Laterality Date  . BREAST SURGERY     reduction  . CARPAL TUNNEL RELEASE Bilateral   . CHOLECYSTECTOMY    . CYSTOSCOPY WITH URETEROSCOPY, STONE BASKETRY AND STENT PLACEMENT  10/09/2016   Procedure: CYSTOSCOPY WITH URETEROSCOPY, STONE BASKETRY AND STENT PLACEMENT;  Surgeon: Hollice Espy, MD;  Location: [facility] ORS;  Service: Urology;;  . DIRECT LARYNGOSCOPY Left 04/15/2015   Procedure: DIRECT LARYNGOSCOPY  BIOPSY AND LASER LEFT TONGUE LESION;  Surgeon: Melissa Montane, MD;  Location: [place];  Service: ENT;  Laterality: Left;  . ESOPHAGOGASTRODUODENOSCOPY (EGD) WITH PROPOFOL N/A 09/03/2019   Procedure: ESOPHAGOGASTRODUODENOSCOPY (EGD) WITH PROPOFOL;  Surgeon: Toledo, Benay Pike, MD;  Location: [facility] ENDOSCOPY;  Service: Gastroenterology;  Laterality: N/A;  . REDUCTION MAMMAPLASTY    . TONSILLECTOMY    . URETER SURGERY    . VAGINAL HYSTERECTOMY  2000     reports that she is a non-smoker but has been exposed to tobacco smoke. She has never  used smokeless tobacco. She reports previous alcohol use. She reports that she does not use drugs.  Allergies  Allergen Reactions  . Amoxicillin Rash  . Augmentin [Amoxicillin-Pot Clavulanate] Itching and Rash    Family History  Problem Relation Age of Onset  . Kidney Stones Mother   . Diabetes Mother   . COPD Mother   . Kidney Stones Father   . Migraines Maternal Grandmother   . Stroke Maternal Grandmother   . Diabetes Maternal Grandmother   . Prostate cancer Maternal Grandfather   . Kidney cancer Neg Hx   . Bladder Cancer Neg Hx      Prior to Admission medications   Medication Sig Start Date End Date Taking? Authorizing Provider  DULoxetine (CYMBALTA) 30 MG capsule Take 1 capsule by mouth daily. 01/14/18 02/16/20 Yes [provider]  albuterol (PROVENTIL) (2.5 MG/3ML) 0.083% nebulizer solution Inhale 2.5 mg into the lungs every 4 (four) hours as needed for wheezing or shortness of breath.  06/29/19 06/28/20  [provider]  amLODipine (NORVASC) 10 MG tablet Take 1 tablet (10 mg total) by mouth daily. 01/20/20   Luvenia Redden, PA-C  baclofen (LIORESAL) 10 MG tablet Take 1 tablet (10 mg total) by mouth 3 (three) times daily as needed. 10/03/18   Coral Spikes, DO  budesonide-formoterol (SYMBICORT) 160-4.5 MCG/ACT inhaler Inhale 2 puffs into the lungs daily.  08/16/19   [provider]  Cholecalciferol 50 MCG (2000 UT) CAPS Take 3 capsules daily for 3 months, then reduce to 1 capsule daily thereafter for Vitamin D Deficiency. 08/18/19   [provider]  Cyanocobalamin (VITAMIN B 12 PO) Take 5,000 mg by mouth.    [provider]  ergocalciferol (VITAMIN D2) 1.25 MG (50000 UT) capsule Take by mouth. 12/30/18   [provider]  gabapentin (NEURONTIN) 600 MG tablet Take 300 mg by mouth in the morning, at noon, and at bedtime. 01/06/20 01/05/21  [provider]  hydrochlorothiazide (HYDRODIURIL) 25 MG tablet Take 25 mg by mouth  daily.     [provider]  levothyroxine (SYNTHROID, LEVOTHROID) 25 MCG tablet Take 25 mcg by mouth daily before breakfast.    [provider]  linaclotide (LINZESS) 72 MCG capsule Take 72 mcg by mouth daily before breakfast.    [provider]  losartan (COZAAR) 100 MG tablet Take 100 mg by mouth daily. 07/19/19   [provider]  Magnesium 500 MG CAPS Take 1 capsule (500 mg total) by mouth 2 (two) times daily at 8 am and 10 pm. 11/03/19 05/01/20  Gillis Santa, MD  meclizine (ANTIVERT) 25 MG tablet Take 25 mg by mouth 3 (three) times daily as needed for dizziness.    [provider]  methocarbamol (ROBAXIN) 750 MG tablet Take 750 mg by mouth 4 (four) times daily.    [provider]  methylPREDNISolone (MEDROL DOSEPAK) 4 MG TBPK tablet Take per package instructions 01/21/20  Luvenia Redden, PA-C  mirabegron ER (MYRBETRIQ) 25 MG TB24 tablet Take 25 mg by mouth daily. Patient not taking: Reported on 01/11/2020    [provider]  ondansetron (ZOFRAN-ODT) 4 MG disintegrating tablet Take 4 mg by mouth every 8 (eight) hours as needed for nausea or vomiting.    [provider]  pantoprazole (PROTONIX) 40 MG tablet Take 40 mg by mouth daily. 08/16/19   [provider]  rosuvastatin (CRESTOR) 10 MG tablet Take 10 mg by mouth daily.    [provider]  SUMAtriptan (IMITREX) 100 MG tablet Take 100 mg by mouth every 2 (two) hours as needed.  03/12/19   [provider]  valACYclovir (VALTREX) 500 MG tablet Take 500 mg by mouth 2 (two) times daily.  10/10/15   [provider]  venlafaxine XR (EFFEXOR-XR) 75 MG 24 hr capsule Take 75 mg by mouth daily with breakfast.    [provider]  fexofenadine (ALLEGRA) 180 MG tablet Take 180 mg by mouth daily. Patient not taking: Reported on 01/11/2020 07/19/19 01/20/20  [provider]  RABEprazole (ACIPHEX) 20 MG tablet Take 20 mg by mouth daily.  07/19/19 01/20/20  [provider]  ranitidine (ZANTAC) 150 MG tablet Take 150 mg by mouth at bedtime. Patient not taking: Reported on 01/11/2020  01/20/20  [provider]    Physical Exam: Vitals:   02/16/20 1320 02/16/20 1322 02/16/20 1323 02/16/20 1500  BP: (!) 166/78   (!) 106/95  Pulse: 80   99  Resp: (!) 26   19  Temp:   98.3 F (36.8 C)   TempSrc:   Oral   SpO2: 93%   97%  Weight:  (!) 117.9 kg    Height:  5\' 5"  (1.651 m)       Vitals:   02/16/20 1320 02/16/20 1322 02/16/20 1323 02/16/20 1500  BP: (!) 166/78   (!) 106/95  Pulse: 80   99  Resp: (!) 26   19  Temp:   98.3 F (36.8 C)   TempSrc:   Oral   SpO2: 93%   97%  Weight:  (!) 117.9 kg    Height:  5\' 5"  (1.651 m)      Constitutional: NAD, alert and oriented x 3 Eyes: PERRL, lids and conjunctivae normal ENMT: Mucous membranes are moist.  Neck: normal, supple, no masses, no thyromegaly Respiratory: clear to auscultation bilaterally, scattered wheezing, no crackles. Normal respiratory effort. No accessory muscle use.  Cardiovascular: Regular rate and rhythm, no murmurs / rubs / gallops. No extremity edema. 2+ pedal pulses. No carotid bruits.  Abdomen: no tenderness, no masses palpated. No hepatosplenomegaly. Bowel sounds positive.  Musculoskeletal: no clubbing / cyanosis. No joint deformity upper and lower extremities.  Skin: no rashes, lesions, ulcers.  Neurologic: No gross focal neurologic deficit. Psychiatric: Normal mood and affect.   Labs on Admission: I have personally reviewed following labs and imaging studies  CBC: Recent Labs  Lab 02/16/20 1355  WBC 6.9  NEUTROABS 2.8  HGB 13.1  HCT 38.1  MCV 95.0  PLT 132   Basic Metabolic Panel: Recent Labs  Lab 02/16/20 1355  NA 138  K 3.5  CL 105  CO2 24  GLUCOSE 96  BUN 12  CREATININE 0.74  CALCIUM 9.0  MG 2.1   GFR: Estimated Creatinine Clearance: 104.5 mL/min (by C-G formula based on SCr of 0.74 mg/dL). Liver Function  Tests: Recent Labs  Lab 02/16/20 1355  AST 26  ALT 35  ALKPHOS 45  BILITOT 0.8  PROT 7.0  ALBUMIN 4.0   No results for input(s): LIPASE, AMYLASE in the last 168 hours. No results for input(s): AMMONIA in the last 168 hours. Coagulation Profile: Recent Labs  Lab 02/16/20 1355  INR 1.0   Cardiac Enzymes: No results for input(s): CKTOTAL, CKMB, CKMBINDEX, TROPONINI in the last 168 hours. BNP (last 3 results) No results for input(s): PROBNP in the last 8760 hours. HbA1C: No results for input(s): HGBA1C in the last 72 hours. CBG: No results for input(s): GLUCAP in the last 168 hours. Lipid Profile: No results for input(s): CHOL, HDL, LDLCALC, TRIG, CHOLHDL, LDLDIRECT in the last 72 hours. Thyroid Function Tests: No results for input(s): TSH, T4TOTAL, FREET4, T3FREE, THYROIDAB in the last 72 hours. Anemia Panel: No results for input(s): VITAMINB12, FOLATE, FERRITIN, TIBC, IRON, RETICCTPCT in the last 72 hours. Urine analysis:    Component Value Date/Time   COLORURINE YELLOW (A) 10/08/2016 0842   APPEARANCEUR Clear 11/08/2016 0930   LABSPEC 1.010 10/08/2016 0842   LABSPEC 1.025 03/09/2014 1832   PHURINE 5.0 10/08/2016 0842   GLUCOSEU Negative 11/08/2016 0930   GLUCOSEU Negative 03/09/2014 1832   HGBUR LARGE (A) 10/08/2016 0842   BILIRUBINUR Negative 11/08/2016 0930   BILIRUBINUR Negative 03/09/2014 1832   KETONESUR NEGATIVE 10/08/2016 0842   PROTEINUR Negative 11/08/2016 0930   PROTEINUR NEGATIVE 10/08/2016 0842   NITRITE Negative 11/08/2016 0930   NITRITE NEGATIVE 10/08/2016 0842   LEUKOCYTESUR Negative 11/08/2016 0930   LEUKOCYTESUR Negative 03/09/2014 1832    Radiological Exams on Admission: CT ANGIO CHEST PE W OR WO CONTRAST  Result Date: 02/16/2020 CLINICAL DATA:  Shortness of breath EXAM: CT ANGIOGRAPHY CHEST WITH CONTRAST TECHNIQUE: Multidetector CT imaging of the chest was performed using the standard protocol during bolus administration of intravenous  contrast. Multiplanar CT image reconstructions and MIPs were obtained to evaluate the vascular anatomy. CONTRAST:  162mL OMNIPAQUE IOHEXOL 350 MG/ML SOLN COMPARISON:  Chest CT August 14, 2019 FINDINGS: Cardiovascular: There are pulmonary emboli arising from the distal main right pulmonary artery. There are multiple pulmonary emboli in lower lobe pulmonary artery branches, more severe on the right than on the left. The right ventricle to left ventricle diameter ratio is less than 0.9, not consistent with right heart strain. There is no appreciable thoracic aortic aneurysm or dissection. Visualized great vessels appear unremarkable. There is no appreciable pericardial effusion or pericardial thickening. Mediastinum/Nodes: Visualized thyroid appears unremarkable. There is no evident thoracic adenopathy. No esophageal lesions are appreciable. Note that there is fairly extensive generalized mediastinal fat. Lungs/Pleura: No edema or airspace opacity evident. There is slight atelectatic change in the lung bases. No pulmonary infarcts are demonstrable. No pleural effusions appreciable. Upper Abdomen: There is hepatic steatosis. Gallbladder is absent. Visualized upper abdominal structures otherwise appear unremarkable. Musculoskeletal: There is stable anterior wedging of the T4 vertebral body. There are foci of degenerative change in the thoracic spine. No blastic or lytic bone lesions. No chest wall lesions. Review of the MIP images confirms the above findings. IMPRESSION: 1. Multiple pulmonary emboli bilaterally, more severe on the right than on the left. No appreciable right heart strain. 2. Atelectatic change in the lung bases. No parenchymal lung edema or consolidation. 3.  No evident adenopathy. 4.  Hepatic steatosis. 5.  Gallbladder absent. Critical Value/emergent results were called by telephone at the time of interpretation on 02/16/2020 at 12:15 pm to provider [facility] , who verbally acknowledged these  results. Electronically Signed   By:  Lowella Grip III M.D.   On: 02/16/2020 12:15    EKG: Independently reviewed.  Sinus rhythm with PVCs   Assessment/Plan Principal Problem:   Pulmonary embolism (HCC) Active Problems:   Obesity, Class III, BMI 40-49.9 (morbid obesity) ([place])   Essential hypertension   Gastroesophageal reflux disease   Hypothyroidism, unspecified   COPD with acute bronchitis ([place])    Pulmonary embolism Patient presents to the ER for evaluation of a 1 month history of progressively worsening shortness of breath mostly with exertion associated with chest pain. CT angiogram showed multiple pulmonary emboli.  Start patient on a heparin drip per protocol Obtain 2D echocardiogram to rule out RV strain Obtain lower extremity venous Doppler to rule out DVT   COPD with acute bronchitis Continue as needed bronchodilator therapy and inhaled steroids Place patient on Zithromax 500 mg daily to complete a 5-day course of therapy    Morbid obesity BMI (40.37) Complicates overall prognosis and care   Hypothyroidism Continue Synthroid   Hypertension Continue losartan, HCTZ and amlodipine   Anxiety and depression Stable Continue venlafaxine  DVT prophylaxis: Heparin Code Status: Full code Family Communication: Greater than 50% of time was spent discussing plan of care with patient at the bedside.  She verbalizes understanding and agrees with the plan of care.  All questions and concerns have been addressed. Disposition Plan: Back to previous home environment Consults called: None    Melo Stauber MD [facility]     02/16/2020, 4:36 PM

## 2020-02-16 NOTE — Consult Note (Signed)
[facility] for Heparin Indication: pulmonary embolus  Allergies  Allergen Reactions  . Amoxicillin Rash  . Augmentin [Amoxicillin-Pot Clavulanate] Itching and Rash    Patient Measurements: Height: 5\' 5"  (165.1 cm) Weight: (!) 117.9 kg (259 lb 14.8 oz) IBW/kg (Calculated) : 57 Heparin Dosing Weight: 85.2 kg  Vital Signs: Temp: 98.3 F (36.8 C) (07/27 1323) Temp Source: Oral (07/27 1323) BP: 166/78 (07/27 1320) Pulse Rate: 80 (07/27 1320)  Labs: Recent Labs    02/16/20 1355  HGB 13.1  HCT 38.1  PLT 219    CrCl cannot be calculated (Patient's most recent lab result is older than the maximum 21 days allowed.).   Medical History: Past Medical History:  Diagnosis Date  . Anxiety and depression   . Arthritis   . Collagen vascular disease (HCC)    RA  . Fatty liver disease, nonalcoholic   . GERD (gastroesophageal reflux disease)   . Headache   . Herpes simplex vulvovaginitis   . History of kidney stones   . History of stomach ulcers   . HLD (hyperlipidemia)   . Hypertension   . Hypothyroid   . Hypothyroidism   . Liver disease   . Lymphedema of right lower extremity   . Morbid obesity ([place])   . Multiple gastric ulcers   . PONV (postoperative nausea and vomiting)   . Pre-diabetes   . 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  . Seizures ([place])    only had with preeclampsia during pregnancy at age 50. none since  . Sleep apnea   . Stress incontinence (female) (female)   . Vitamin B 12 deficiency   . Vitamin D deficiency     Medications:  (Not in a hospital admission)  Scheduled:  Infusions:  PRN:  Anti-infectives (From admission, onward)   None      Assessment: Pharmacy consulted to start heparin for PE. No DOAC PTA.   Goal of Therapy:  Heparin level 0.3-0.7 units/ml Monitor platelets by anticoagulation protocol: Yes   Plan:  Give 4250 units bolus x 1 Start  heparin infusion at 1350 units/hr Check anti-Xa level in 6 hours and daily while on heparin Continue to monitor H&H and platelets  Oswald Hillock, PharmD, BCPS 02/16/2020,2:22 PM

## 2020-02-16 NOTE — Consult Note (Signed)
ANTICOAGULATION CONSULT NOTE  Pharmacy Consult for Heparin Indication: pulmonary embolus  Allergies  Allergen Reactions   Amoxicillin Rash   Augmentin [Amoxicillin-Pot Clavulanate] Itching and Rash    Patient Measurements: Height: 5\' 5"  (165.1 cm) Weight: (!) 117.9 kg (259 lb 14.8 oz) IBW/kg (Calculated) : 57 Heparin Dosing Weight: 85.2 kg  Vital Signs: Temp: 98.3 F (36.8 C) (07/27 1323) Temp Source: Oral (07/27 1323) BP: 142/96 (07/27 2130) Pulse Rate: 86 (07/27 2130)  Labs: Recent Labs    02/16/20 1355 02/16/20 1541 02/16/20 2232  HGB 13.1  --   --   HCT 38.1  --   --   PLT 219  --   --   APTT 27  --   --   LABPROT 12.6  --   --   INR 1.0  --   --   HEPARINUNFRC  --   --  0.71*  CREATININE 0.74  --   --   TROPONINIHS 4 4  --     Estimated Creatinine Clearance: 104.5 mL/min (by C-G formula based on SCr of 0.74 mg/dL).   Medical History: Past Medical History:  Diagnosis Date   Anxiety and depression    Arthritis    Collagen vascular disease (HCC)    RA   Fatty liver disease, nonalcoholic    GERD (gastroesophageal reflux disease)    Headache    Herpes simplex vulvovaginitis    History of kidney stones    History of stomach ulcers    HLD (hyperlipidemia)    Hypertension    Hypothyroid    Hypothyroidism    Liver disease    Lymphedema of right lower extremity    Morbid obesity (HCC)    Multiple gastric ulcers    PONV (postoperative nausea and vomiting)    Pre-diabetes    Renal disorder    kidney stones and shortened ureter repaired with surgery as a child.   Seizures ([employer])    only had with preeclampsia during pregnancy at age 54. none since   Sleep apnea    Stress incontinence (female) (female)    Vitamin B 12 deficiency    Vitamin D deficiency     Medications:  (Not in a hospital admission)  Scheduled:  Infusions:  PRN:  Anti-infectives (From admission, onward)   Start     Dose/Rate Route Frequency Ordered Stop    02/16/20 1645  azithromycin (ZITHROMAX) tablet 500 mg     Discontinue     500 mg Oral Daily 02/16/20 1634 02/21/20 2159   02/16/20 1600  valACYclovir (VALTREX) tablet 500 mg     Discontinue     500 mg Oral 2 times daily 02/16/20 1537        Assessment: Pharmacy consulted to start heparin for PE. No DOAC PTA.   Goal of Therapy:  Heparin level 0.3-0.7 units/ml Monitor platelets by anticoagulation protocol: Yes   Plan:  07/27 @ 2230 HL 0.71 slightly supratherapeutic. Will decrease rate to 1250 units/hr and will recheck HL at 0500 and will continue to monitor.  Tobie Lords, PharmD, BCPS 02/16/2020,11:24 PM

## 2020-02-17 DIAGNOSIS — I2699 Other pulmonary embolism without acute cor pulmonale: Secondary | ICD-10-CM | POA: Diagnosis not present

## 2020-02-17 LAB — BASIC METABOLIC PANEL
Anion gap: 10 (ref 5–15)
BUN: 12 mg/dL (ref 6–20)
CO2: 27 mmol/L (ref 22–32)
Calcium: 9.2 mg/dL (ref 8.9–10.3)
Chloride: 102 mmol/L (ref 98–111)
Creatinine, Ser: 0.99 mg/dL (ref 0.44–1.00)
GFR calc Af Amer: >60 mL/min (ref >60)
GFR calc non Af Amer: >60 mL/min (ref >60)
Glucose, Bld: 106 mg/dL — ABNORMAL HIGH (ref 70–99)
Potassium: 3.2 mmol/L — ABNORMAL LOW (ref 3.5–5.1)
Sodium: 139 mmol/L (ref 135–145)

## 2020-02-17 LAB — CBC
HCT: 35.5 % — ABNORMAL LOW (ref 36.0–46.0)
Hemoglobin: 12.1 g/dL (ref 12.0–15.0)
MCH: 32.7 pg (ref 26.0–34.0)
MCHC: 34.1 g/dL (ref 30.0–36.0)
MCV: 95.9 fL (ref 80.0–100.0)
Platelets: 212 10*3/uL (ref 150–400)
RBC: 3.7 MIL/uL — ABNORMAL LOW (ref 3.87–5.11)
RDW: 13.6 % (ref 11.5–15.5)
WBC: 5.9 10*3/uL (ref 4.0–10.5)
nRBC: 0 % (ref 0.0–0.2)

## 2020-02-17 LAB — HEPARIN LEVEL (UNFRACTIONATED)
Heparin Unfractionated: 0.68 IU/mL (ref 0.30–0.70)
Heparin Unfractionated: 0.62 IU/mL (ref 0.30–0.70)

## 2020-02-17 LAB — HIV ANTIBODY (ROUTINE TESTING W REFLEX): HIV Screen 4th Generation wRfx: NONREACTIVE

## 2020-02-17 MED ORDER — TRAMADOL HCL 50 MG PO TABS: 50.0000 mg | ORAL_TABLET | Freq: Four times a day (QID) | ORAL | Status: DC | PRN

## 2020-02-17 MED ORDER — MENTHOL 3 MG MT LOZG
1.0000 | LOZENGE | OROMUCOSAL | Status: DC | PRN
  Filled 2020-02-17 (×2): qty 9

## 2020-02-17 MED ORDER — ARIPIPRAZOLE 5 MG PO TABS
5.0000 mg | ORAL_TABLET | Freq: Every day | ORAL | Status: DC
  Administered 2020-02-17: 5 mg via ORAL
  Filled 2020-02-17 (×2): qty 1

## 2020-02-17 MED ORDER — APIXABAN 5 MG PO TABS: 5.0000 mg | ORAL_TABLET | Freq: Two times a day (BID) | ORAL | Status: DC

## 2020-02-17 MED ORDER — HYDROMORPHONE HCL 1 MG/ML IJ SOLN: 0.5000 mg | INTRAMUSCULAR | Status: DC | PRN

## 2020-02-17 MED ORDER — APIXABAN 5 MG PO TABS
10.0000 mg | ORAL_TABLET | Freq: Two times a day (BID) | ORAL | Status: DC
  Administered 2020-02-17 – 2020-02-18 (×3): 10 mg via ORAL
  Filled 2020-02-17 (×3): qty 2

## 2020-02-17 MED ORDER — DOXYCYCLINE HYCLATE 100 MG PO TABS
100.0000 mg | ORAL_TABLET | Freq: Two times a day (BID) | ORAL | Status: DC
  Administered 2020-02-17 – 2020-02-18 (×3): 100 mg via ORAL
  Filled 2020-02-17 (×3): qty 1

## 2020-02-17 NOTE — ED Notes (Signed)
Pt c/o headache. Refuses tylenol and norco.

## 2020-02-17 NOTE — ED Notes (Signed)
Pt up to the toilet independently with a steady gait. SR noted on the monitor. Pt does have dry cough with c/o sore throat and HA, when MD offers tylenol, pt states "that doesn't work".Marland Kitchen

## 2020-02-17 NOTE — Consult Note (Signed)
[facility] for Heparin Indication: pulmonary embolus  Allergies  Allergen Reactions  . Amoxicillin Rash  . Augmentin [Amoxicillin-Pot Clavulanate] Itching and Rash    Patient Measurements: Height: 5\' 5"  (165.1 cm) Weight: (!) 117.9 kg (259 lb 14.8 oz) IBW/kg (Calculated) : 57 Heparin Dosing Weight: 85.2 kg  Vital Signs: BP: 132/68 (07/28 0840) Pulse Rate: 87 (07/28 0840)  Labs: Recent Labs    02/16/20 1355 02/16/20 1541 02/16/20 2232 02/17/20 0414 02/17/20 1039  HGB 13.1  --   --  12.1  --   HCT 38.1  --   --  35.5*  --   PLT 219  --   --  212  --   APTT 27  --   --   --   --   LABPROT 12.6  --   --   --   --   INR 1.0  --   --   --   --   HEPARINUNFRC  --   --  0.71* 0.68 0.62  CREATININE 0.74  --   --  0.99  --   TROPONINIHS 4 4  --   --   --     Estimated Creatinine Clearance: 84.4 mL/min (by C-G formula based on SCr of 0.99 mg/dL).   Medical History: Past Medical History:  Diagnosis Date  . Anxiety and depression   . Arthritis   . Collagen vascular disease (HCC)    RA  . Fatty liver disease, nonalcoholic   . GERD (gastroesophageal reflux disease)   . Headache   . Herpes simplex vulvovaginitis   . History of kidney stones   . History of stomach ulcers   . HLD (hyperlipidemia)   . Hypertension   . Hypothyroid   . Hypothyroidism   . Liver disease   . Lymphedema of right lower extremity   . Morbid obesity ([place])   . Multiple gastric ulcers   . PONV (postoperative nausea and vomiting)   . Pre-diabetes   . 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  . Seizures ([place])    only had with preeclampsia during pregnancy at age 39. none since  . Sleep apnea   . Stress incontinence (female) (female)   . Vitamin B 12 deficiency   . Vitamin D deficiency    Assessment: Pharmacy consulted to start heparin for PE. No anticoagulants prior to admission.   7/28 0414 HL 0.68,  therapeutic x 1 7/28 1039 HL 0.62, therapeutic x 2  Goal of Therapy:  Heparin level 0.3-0.7 units/ml Monitor platelets by anticoagulation protocol: Yes   Plan:  07/28 @ 1039 HL 0.62 is therapeutic, this is 2nd consecutive therapeutic level. Will continue rate of 1250 units/hr and will recheck HL with AM labs. Daily CBC while on Heparin drip.  Paulina Fusi, PharmD, BCPS 02/17/2020 11:12 AM

## 2020-02-17 NOTE — Consult Note (Signed)
[place] for Heparin Indication: pulmonary embolus  Allergies  Allergen Reactions  . Amoxicillin Rash  . Augmentin [Amoxicillin-Pot Clavulanate] Itching and Rash    Patient Measurements: Height: 5\' 5"  (165.1 cm) Weight: (!) 117.9 kg (259 lb 14.8 oz) IBW/kg (Calculated) : 57 Heparin Dosing Weight: 85.2 kg  Vital Signs: BP: 153/89 (07/28 0530) Pulse Rate: 85 (07/28 0530)  Labs: Recent Labs    02/16/20 1355 02/16/20 1541 02/16/20 2232 02/17/20 0414  HGB 13.1  --   --  12.1  HCT 38.1  --   --  35.5*  PLT 219  --   --  212  APTT 27  --   --   --   LABPROT 12.6  --   --   --   INR 1.0  --   --   --   HEPARINUNFRC  --   --  0.71* 0.68  CREATININE 0.74  --   --  0.99  TROPONINIHS 4 4  --   --     Estimated Creatinine Clearance: 84.4 mL/min (by C-G formula based on SCr of 0.99 mg/dL).   Medical History: Past Medical History:  Diagnosis Date  . Anxiety and depression   . Arthritis   . Collagen vascular disease (HCC)    RA  . Fatty liver disease, nonalcoholic   . GERD (gastroesophageal reflux disease)   . Headache   . Herpes simplex vulvovaginitis   . History of kidney stones   . History of stomach ulcers   . HLD (hyperlipidemia)   . Hypertension   . Hypothyroid   . Hypothyroidism   . Liver disease   . Lymphedema of right lower extremity   . Morbid obesity ([place])   . Multiple gastric ulcers   . PONV (postoperative nausea and vomiting)   . Pre-diabetes   . 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  . Seizures ([place])    only had with preeclampsia during pregnancy at age 81. none since  . Sleep apnea   . Stress incontinence (female) (female)   . Vitamin B 12 deficiency   . Vitamin D deficiency     Medications:  (Not in a hospital admission)  Scheduled:  Infusions:  PRN:  Anti-infectives (From admission, onward)   Start     Dose/Rate Route Frequency Ordered Stop    02/16/20 1645  azithromycin (ZITHROMAX) tablet 500 mg     Discontinue     500 mg Oral Daily 02/16/20 1634 02/21/20 2159   02/16/20 1600  valACYclovir (VALTREX) tablet 500 mg     Discontinue     500 mg Oral 2 times daily 02/16/20 1537        Assessment: Pharmacy consulted to start heparin for PE. No DOAC PTA.   Goal of Therapy:  Heparin level 0.3-0.7 units/ml Monitor platelets by anticoagulation protocol: Yes   Plan:  07/28 @ 0400 HL 0.68 therapeutic. Will continue rate of 1250 units/hr and will recheck HL at 1000 and will continue to monitor.  Tobie Lords, PharmD, BCPS 02/17/2020,6:23 AM

## 2020-02-17 NOTE — Progress Notes (Signed)
PROGRESS NOTE    Tammy Young  QJF:[id number] DOB: [birth year] DOA: 02/16/2020 PCP: Toni Arthurs, NP    Brief Narrative: 54 y.o. female with medical history significant for COPD, hypertension, anxiety disorder, morbid obesity, obstructive sleep apnea, hypothyroidism and recent diagnosis of [facility] spotted fever on doxycycline who presents to the emergency room for evaluation of a 1 month history of worsening shortness of breath mostly with exertion associated with chest pain.  She also complains of a cough which is productive of yellow phlegm mostly in the mornings.  She denies having any fever chills or any sick contacts.  Patient also complains of diarrhea which she has had for about 2 weeks and has stools have been tested for C. difficile which came back negative.  She also complains of right calf swelling but denies having any pain.  She denies nicotine use and does not have any history of malignancy.  She denies recent surgery and is not on estrogen supplementation.  Due to worsening of her symptoms she had a CT angiogram done as an outpatient which showed multiple pulmonary emboli bilaterally, more severe on the right than on the left. No appreciable right heart strain. Twelve-lead EKG shows sinus rhythm with PVCs She will be admitted to the hospital for further evaluation   ED Course: Patient is a 54 year old female who presents to the emergency room for a 1 month history of shortness of breath which has progressively worsened and is associated with chest pain and a cough productive of yellow phlegm mostly in the mornings.  Patient had a CT angiogram which showed multiple pulmonary emboli.  She was started on heparin drip in the ER and will be admitted to the hospital for further evaluation.  Assessment & Plan:   Principal Problem:   Pulmonary embolism (HCC) Active Problems:   Obesity, Class III, BMI 40-49.9 (morbid obesity) (HCC)   Essential hypertension   Gastroesophageal  reflux disease   Hypothyroidism, unspecified   COPD with acute bronchitis ([place])       Pulmonary embolism-patient admitted with worsening shortness of breath over the past 1 month.  CT chest showed multiple PE.  She was started on heparin. We will switch her to Eliquis. Follow-up echo and venous Doppler.   COPD with acute bronchitis Continue as needed bronchodilator therapy and inhaled steroids Patient is on doxycycline as an outpatient to finish a course of 4 weeks continue.    Morbid obesity BMI (89.37) Complicates overall prognosis and care   Hypothyroidism Continue Synthroid   Hypertension Continue losartan, HCTZ and amlodipine   Anxiety and depression Stable Continue venlafaxin She is on multiple antidepressants.  She also reports she is on BuSpar which is not documented in the med reconciliation.  Estimated body mass index is 43.25 kg/m as calculated from the following:   Height as of this encounter: 5\' 5"  (1.651 m).   Weight as of this encounter: 117.9 kg.  DVT prophylaxis: Heparin drip Code Status full code Family Communication: None at bedside Disposition Plan:  Status is: Inpatient  Dispo: The patient is from: Home              Anticipated d/c is to: Home              Anticipated d/c date is: 1 day              Patient currently is not medically stable to d/c.  Patient admitted with acute PE work-up pending on heparin drip.   Consultants:  None  Procedures: None Antimicrobials:  Anti-infectives (From admission, onward)   Start     Dose/Rate Route Frequency Ordered Stop   02/17/20 1215  doxycycline (VIBRA-TABS) tablet 100 mg     Discontinue     100 mg Oral 2 times daily 02/17/20 1211     02/16/20 1645  azithromycin (ZITHROMAX) tablet 500 mg     Discontinue     500 mg Oral Daily 02/16/20 1634 02/21/20 2159   02/16/20 1600  valACYclovir (VALTREX) tablet 500 mg     Discontinue     500 mg Oral 2 times daily 02/16/20 1537        Subjective:  Patient resting in bed complains of a productive cough and shortness of breath has not gotten out of bed since coming to the hospital.   Objective: Vitals:   02/17/20 0730 02/17/20 0800 02/17/20 0830 02/17/20 0840  BP: 117/76 (!) 133/89 (!) 132/68 (!) 132/68  Pulse: 91 87 90 87  Resp: 18 15 15 13   Temp:      TempSrc:      SpO2: 92% 93% 95% 99%  Weight:      Height:       No intake or output data in the 24 hours ending 02/17/20 1111 Filed Weights   02/16/20 1322  Weight: (!) 117.9 kg    Examination:  General exam: Appears calm and comfortable  Respiratory system: Clear to auscultation. Respiratory effort normal. Cardiovascular system: S1 & S2 heard, RRR. No JVD, murmurs, rubs, gallops or clicks. No pedal edema. Gastrointestinal system: Abdomen is nondistended, soft and nontender. No organomegaly or masses felt. Normal bowel sounds heard. Central nervous system: Alert and oriented. No focal neurological deficits. Extremities: Symmetric 5 x 5 power. Skin: No rashes, lesions or ulcers Psychiatry: Judgement and insight appear normal. Mood & affect appropriate.     Data Reviewed: I have personally reviewed following labs and imaging studies  CBC: Recent Labs  Lab 02/16/20 1355 02/17/20 0414  WBC 6.9 5.9  NEUTROABS 2.8  --   HGB 13.1 12.1  HCT 38.1 35.5*  MCV 95.0 95.9  PLT 219 952   Basic Metabolic Panel: Recent Labs  Lab 02/16/20 1355 02/17/20 0414  NA 138 139  K 3.5 3.2*  CL 105 102  CO2 24 27  GLUCOSE 96 106*  BUN 12 12  CREATININE 0.74 0.99  CALCIUM 9.0 9.2  MG 2.1  --    GFR: Estimated Creatinine Clearance: 84.4 mL/min (by C-G formula based on SCr of 0.99 mg/dL). Liver Function Tests: Recent Labs  Lab 02/16/20 1355  AST 26  ALT 35  ALKPHOS 45  BILITOT 0.8  PROT 7.0  ALBUMIN 4.0   No results for input(s): LIPASE, AMYLASE in the last 168 hours. No results for input(s): AMMONIA in the last 168 hours. Coagulation  Profile: Recent Labs  Lab 02/16/20 1355  INR 1.0   Cardiac Enzymes: No results for input(s): CKTOTAL, CKMB, CKMBINDEX, TROPONINI in the last 168 hours. BNP (last 3 results) No results for input(s): PROBNP in the last 8760 hours. HbA1C: No results for input(s): HGBA1C in the last 72 hours. CBG: No results for input(s): GLUCAP in the last 168 hours. Lipid Profile: No results for input(s): CHOL, HDL, LDLCALC, TRIG, CHOLHDL, LDLDIRECT in the last 72 hours. Thyroid Function Tests: No results for input(s): TSH, T4TOTAL, FREET4, T3FREE, THYROIDAB in the last 72 hours. Anemia Panel: No results for input(s): VITAMINB12, FOLATE, FERRITIN, TIBC, IRON, RETICCTPCT in the last 72 hours. Sepsis  Labs: No results for input(s): PROCALCITON, LATICACIDVEN in the last 168 hours.  Recent Results (from the past 240 hour(s))  SARS Coronavirus 2 by RT PCR (hospital order, performed in [facility] hospital lab) Nasopharyngeal Nasopharyngeal Swab     Status: None   Collection Time: 02/16/20  2:45 PM   Specimen: Nasopharyngeal Swab  Result Value Ref Range Status   SARS Coronavirus 2 NEGATIVE NEGATIVE Final    Comment: (NOTE) SARS-CoV-2 target nucleic acids are NOT DETECTED.  The SARS-CoV-2 RNA is generally detectable in upper and lower respiratory specimens during the acute phase of infection. The lowest concentration of SARS-CoV-2 viral copies this assay can detect is 250 copies / mL. A negative result does not preclude SARS-CoV-2 infection and should not be used as the sole basis for treatment or other patient management decisions.  A negative result may occur with improper specimen collection / handling, submission of specimen other than nasopharyngeal swab, presence of viral mutation(s) within the areas targeted by this assay, and inadequate number of viral copies (<250 copies / mL). A negative result must be combined with clinical observations, patient history, and epidemiological  information.  Fact Sheet for Patients:   StrictlyIdeas.no  Fact Sheet for Healthcare Providers: BankingDealers.co.za  This test is not yet approved or  cleared by the [place] FDA and has been authorized for detection and/or diagnosis of SARS-CoV-2 by FDA under an Emergency Use Authorization (EUA).  This EUA will remain in effect (meaning this test can be used) for the duration of the COVID-19 declaration under Section 564(b)(1) of the Act, 21 U.S.C. section 360bbb-3(b)(1), unless the authorization is terminated or revoked sooner.  Performed at [facility], [address] [place], [place] [address]          Radiology Studies: CT ANGIO CHEST PE W OR WO CONTRAST  Result Date: 02/16/2020 CLINICAL DATA:  Shortness of breath EXAM: CT ANGIOGRAPHY CHEST WITH CONTRAST TECHNIQUE: Multidetector CT imaging of the chest was performed using the standard protocol during bolus administration of intravenous contrast. Multiplanar CT image reconstructions and MIPs were obtained to evaluate the vascular anatomy. CONTRAST:  141mL OMNIPAQUE IOHEXOL 350 MG/ML SOLN COMPARISON:  Chest CT August 14, 2019 FINDINGS: Cardiovascular: There are pulmonary emboli arising from the distal main right pulmonary artery. There are multiple pulmonary emboli in lower lobe pulmonary artery branches, more severe on the right than on the left. The right ventricle to left ventricle diameter ratio is less than 0.9, not consistent with right heart strain. There is no appreciable thoracic aortic aneurysm or dissection. Visualized great vessels appear unremarkable. There is no appreciable pericardial effusion or pericardial thickening. Mediastinum/Nodes: Visualized thyroid appears unremarkable. There is no evident thoracic adenopathy. No esophageal lesions are appreciable. Note that there is fairly extensive generalized mediastinal fat. Lungs/Pleura: No edema or airspace  opacity evident. There is slight atelectatic change in the lung bases. No pulmonary infarcts are demonstrable. No pleural effusions appreciable. Upper Abdomen: There is hepatic steatosis. Gallbladder is absent. Visualized upper abdominal structures otherwise appear unremarkable. Musculoskeletal: There is stable anterior wedging of the T4 vertebral body. There are foci of degenerative change in the thoracic spine. No blastic or lytic bone lesions. No chest wall lesions. Review of the MIP images confirms the above findings. IMPRESSION: 1. Multiple pulmonary emboli bilaterally, more severe on the right than on the left. No appreciable right heart strain. 2. Atelectatic change in the lung bases. No parenchymal lung edema or consolidation. 3.  No evident adenopathy. 4.  Hepatic steatosis. 5.  Gallbladder absent. Critical Value/emergent results were called by telephone at the time of interpretation on 02/16/2020 at 12:15 pm to provider [facility] , who verbally acknowledged these results. Electronically Signed   By: Lowella Grip III M.D.   On: 02/16/2020 12:15   US Venous Img Lower Bilateral (DVT)  Result Date: 02/16/2020 CLINICAL DATA:  Chronic right calf pain. EXAM: Bilateral LOWER EXTREMITY VENOUS DOPPLER ULTRASOUND TECHNIQUE: Gray-scale sonography with compression, as well as color and duplex ultrasound, were performed to evaluate the deep venous system(s) from the level of the common femoral vein through the popliteal and proximal calf veins. COMPARISON:  August 20, 2019. FINDINGS: VENOUS Normal compressibility of the common femoral, superficial femoral, and popliteal veins, as well as the visualized calf veins. Visualized portions of profunda femoral vein and great saphenous vein unremarkable. No filling defects to suggest DVT on grayscale or color Doppler imaging. Doppler waveforms show normal direction of venous flow, normal respiratory plasticity and response to augmentation. Limited views of the  contralateral common femoral vein are unremarkable. OTHER None. Limitations: none IMPRESSION: No evidence of deep venous thrombosis seen in either lower extremity. Electronically Signed   By: Marijo Conception M.D.   On: 02/16/2020 16:42        Scheduled Meds: . amLODipine  10 mg Oral Daily  . azithromycin  500 mg Oral Daily  . DULoxetine  30 mg Oral Daily  . hydrochlorothiazide  25 mg Oral Daily  . levothyroxine  25 mcg Oral QAC breakfast  . linaclotide  72 mcg Oral QAC breakfast  . losartan  100 mg Oral Daily  . magnesium oxide  400 mg Oral BID AC & HS  . mirabegron ER  25 mg Oral Daily  . mometasone-formoterol  2 puff Inhalation BID  . pantoprazole  40 mg Oral Daily  . rosuvastatin  10 mg Oral Daily  . sodium chloride flush  3 mL Intravenous Q12H  . valACYclovir  500 mg Oral BID  . venlafaxine XR  75 mg Oral Q breakfast  . vitamin B-12  5,000 mcg Oral Daily  . Vitamin D (Ergocalciferol)  50,000 Units Oral Weekly   Continuous Infusions: . sodium chloride    . heparin 1,250 Units/hr (02/17/20 4656)     LOS: 1 day     Georgette Shell, MD [facility]  If 7PM-7AM, please contact night-coverage [email or web address] Password [email or web address] 02/17/2020, 11:11 AM

## 2020-02-17 NOTE — Consult Note (Signed)
[place] for Apixaban Indication: pulmonary embolus  Allergies  Allergen Reactions   Amoxicillin Rash   Augmentin [Amoxicillin-Pot Clavulanate] Itching and Rash    Patient Measurements: Height: 5\' 5"  (165.1 cm) Weight: (!) 117.9 kg (259 lb 14.8 oz) IBW/kg (Calculated) : 57  Vital Signs: BP: 132/68 (07/28 0840) Pulse Rate: 87 (07/28 0840)  Labs: Recent Labs    02/16/20 1355 02/16/20 1541 02/16/20 2232 02/17/20 0414 02/17/20 1039  HGB 13.1  --   --  12.1  --   HCT 38.1  --   --  35.5*  --   PLT 219  --   --  212  --   APTT 27  --   --   --   --   LABPROT 12.6  --   --   --   --   INR 1.0  --   --   --   --   HEPARINUNFRC  --   --  0.71* 0.68 0.62  CREATININE 0.74  --   --  0.99  --   TROPONINIHS 4 4  --   --   --     Estimated Creatinine Clearance: 84.4 mL/min (by C-G formula based on SCr of 0.99 mg/dL).   Medical History: Past Medical History:  Diagnosis Date   Anxiety and depression    Arthritis    Collagen vascular disease (HCC)    RA   Fatty liver disease, nonalcoholic    GERD (gastroesophageal reflux disease)    Headache    Herpes simplex vulvovaginitis    History of kidney stones    History of stomach ulcers    HLD (hyperlipidemia)    Hypertension    Hypothyroid    Hypothyroidism    Liver disease    Lymphedema of right lower extremity    Morbid obesity (HCC)    Multiple gastric ulcers    PONV (postoperative nausea and vomiting)    Pre-diabetes    Renal disorder    kidney stones and shortened ureter repaired with surgery as a child.   Seizures ([place])    only had with preeclampsia during pregnancy at age 23. none since   Sleep apnea    Stress incontinence (female) (female)    Vitamin B 12 deficiency    Vitamin D deficiency     Medications:  (Not in a hospital admission)  Scheduled:   amLODipine  10 mg Oral Daily   apixaban  10 mg Oral BID   Followed by   Derrill Memo ON  02/24/2020] apixaban  5 mg Oral BID   ARIPiprazole  5 mg Oral Daily   azithromycin  500 mg Oral Daily   doxycycline  100 mg Oral BID   DULoxetine  30 mg Oral Daily   hydrochlorothiazide  25 mg Oral Daily   levothyroxine  25 mcg Oral QAC breakfast   losartan  100 mg Oral Daily   magnesium oxide  400 mg Oral BID AC & HS   mirabegron ER  25 mg Oral Daily   mometasone-formoterol  2 puff Inhalation BID   pantoprazole  40 mg Oral Daily   rosuvastatin  10 mg Oral Daily   sodium chloride flush  3 mL Intravenous Q12H   valACYclovir  500 mg Oral BID   venlafaxine XR  75 mg Oral Q breakfast   vitamin B-12  5,000 mcg Oral Daily   Vitamin D (Ergocalciferol)  50,000 Units Oral Weekly   Infusions:   sodium  chloride     PRN: sodium chloride, acetaminophen **OR** acetaminophen, albuterol, baclofen, HYDROcodone-acetaminophen, meclizine, menthol-cetylpyridinium, ondansetron **OR** ondansetron (ZOFRAN) IV, sodium chloride flush, SUMAtriptan, traMADol Anti-infectives (From admission, onward)   Start     Dose/Rate Route Frequency Ordered Stop   02/17/20 1215  doxycycline (VIBRA-TABS) tablet 100 mg     Discontinue     100 mg Oral 2 times daily 02/17/20 1211     02/16/20 1645  azithromycin (ZITHROMAX) tablet 500 mg     Discontinue     500 mg Oral Daily 02/16/20 1634 02/21/20 2159   02/16/20 1600  valACYclovir (VALTREX) tablet 500 mg     Discontinue     500 mg Oral 2 times daily 02/16/20 1537        Assessment: Pharmacy consulted to start apixaban for PE. Heparin d/c'ed. CBC stable.   Goal of Therapy:  Monitor platelets by anticoagulation protocol: Yes   Plan:  Apixaban 10 mg BID for 7 days followed by 5 mg BID.   Oswald Hillock 02/17/2020,12:14 PM

## 2020-02-17 NOTE — ED Notes (Signed)
ED TO INPATIENT HANDOFF REPORT  ED Nurse Name and Phone #: [phone]  S Name/Age/Gender Tammy Young 54 y.o. female Room/Bed: ED36A/ED36A  Code Status   Code Status: Full Code  Home/SNF/Other Home Patient oriented to: self, place, time and situation Is this baseline? Yes   Triage Complete: Triage complete  Chief Complaint Pulmonary embolism [facility]) [I26.99]  Triage Note Has had some chest pain and SHOB. Went to pcp and ordered outpatient CT. Pt sent for multiple bilateral PE    Allergies Allergies  Allergen Reactions  . Amoxicillin Rash  . Augmentin [Amoxicillin-Pot Clavulanate] Itching and Rash    Level of Care/Admitting Diagnosis ED Disposition    ED Disposition Condition [facility] Area: [place] [[id number]]  Level of Care: Med-Surg [16]  Covid Evaluation: Asymptomatic Screening Protocol (No Symptoms)  Diagnosis: Pulmonary embolism ([address]) [[id number]]  Admitting Physician: Gary Fleet  Attending Physician: Gary Fleet  Estimated length of stay: 3 - 4 days  Certification:: I certify this patient will need inpatient services for at least 2 midnights  Bed request comments: With telemetry       B Medical/Surgery History Past Medical History:  Diagnosis Date  . Anxiety and depression   . Arthritis   . Collagen vascular disease (HCC)    RA  . Fatty liver disease, nonalcoholic   . GERD (gastroesophageal reflux disease)   . Headache   . Herpes simplex vulvovaginitis   . History of kidney stones   . History of stomach ulcers   . HLD (hyperlipidemia)   . Hypertension   . Hypothyroid   . Hypothyroidism   . Liver disease   . Lymphedema of right lower extremity   . Morbid obesity ([place])   . Multiple gastric ulcers   . PONV (postoperative nausea and vomiting)   . Pre-diabetes   . 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  . Seizures ([place])    only had  with preeclampsia during pregnancy at age 45. none since  . Sleep apnea   . Stress incontinence (female) (female)   . Vitamin B 12 deficiency   . Vitamin D deficiency    Past Surgical History:  Procedure Laterality Date  . BREAST SURGERY     reduction  . CARPAL TUNNEL RELEASE Bilateral   . CHOLECYSTECTOMY    . CYSTOSCOPY WITH URETEROSCOPY, STONE BASKETRY AND STENT PLACEMENT  10/09/2016   Procedure: CYSTOSCOPY WITH URETEROSCOPY, STONE BASKETRY AND STENT PLACEMENT;  Surgeon: Hollice Espy, MD;  Location: [facility];  Service: Urology;;  . DIRECT LARYNGOSCOPY Left 04/15/2015   Procedure: DIRECT LARYNGOSCOPY BIOPSY AND LASER LEFT TONGUE LESION;  Surgeon: Melissa Montane, MD;  Location: [place];  Service: ENT;  Laterality: Left;  . ESOPHAGOGASTRODUODENOSCOPY (EGD) WITH PROPOFOL N/A 09/03/2019   Procedure: ESOPHAGOGASTRODUODENOSCOPY (EGD) WITH PROPOFOL;  Surgeon: Toledo, Benay Pike, MD;  Location: [facility];  Service: Gastroenterology;  Laterality: N/A;  . REDUCTION MAMMAPLASTY    . TONSILLECTOMY    . URETER SURGERY    . VAGINAL HYSTERECTOMY  2000     A IV Location/Drains/Wounds Patient Lines/Drains/Airways Status    Active Line/Drains/Airways    Name Placement date Placement time Site Days   Peripheral IV 02/16/20 Right Antecubital 02/16/20  1144  Antecubital  1   Peripheral IV 02/16/20 Left Forearm 02/16/20  1404  Forearm  1   Ureteral Drain/Stent Right ureter 6 Fr. 10/09/16  1403  Right ureter  1226   Incision (Closed) 10/09/16 Perineum Other (Comment) 10/09/16  1409   1226          Intake/Output Last 24 hours No intake or output data in the 24 hours ending 02/17/20 1529  Labs/Imaging Results for orders placed or performed during the hospital encounter of 02/16/20 (from the past 48 hour(s))  CBC with Differential     Status: None   Collection Time: 02/16/20  1:55 PM  Result Value Ref Range   WBC 6.9 4.0 - 10.5 K/uL   RBC 4.01 3.87 - 5.11 MIL/uL   Hemoglobin  13.1 12.0 - 15.0 g/dL   HCT 38.1 36 - 46 %   MCV 95.0 80.0 - 100.0 fL   MCH 32.7 26.0 - 34.0 pg   MCHC 34.4 30.0 - 36.0 g/dL   RDW 13.3 11.5 - 15.5 %   Platelets 219 150 - 400 K/uL   nRBC 0.0 0.0 - 0.2 %   Neutrophils Relative % 41 %   Neutro Abs 2.8 1.7 - 7.7 K/uL   Lymphocytes Relative 45 %   Lymphs Abs 3.1 0.7 - 4.0 K/uL   Monocytes Relative 12 %   Monocytes Absolute 0.8 0 - 1 K/uL   Eosinophils Relative 2 %   Eosinophils Absolute 0.1 0 - 0 K/uL   Basophils Relative 0 %   Basophils Absolute 0.0 0 - 0 K/uL   Immature Granulocytes 0 %   Abs Immature Granulocytes 0.03 0.00 - 0.07 K/uL    Comment: Performed at [facility], [address] [place], [place] [address]  Comprehensive metabolic panel     Status: None   Collection Time: 02/16/20  1:55 PM  Result Value Ref Range   Sodium 138 135 - 145 mmol/L   Potassium 3.5 3.5 - 5.1 mmol/L   Chloride 105 98 - 111 mmol/L   CO2 24 22 - 32 mmol/L   Glucose, Bld 96 70 - 99 mg/dL    Comment: Glucose reference range applies only to samples taken after fasting for at least 8 hours.   BUN 12 6 - 20 mg/dL   Creatinine, Ser 0.74 0.44 - 1.00 mg/dL   Calcium 9.0 8.9 - 10.3 mg/dL   Total Protein 7.0 6.5 - 8.1 g/dL   Albumin 4.0 3.5 - 5.0 g/dL   AST 26 15 - 41 U/L   ALT 35 0 - 44 U/L   Alkaline Phosphatase 45 38 - 126 U/L   Total Bilirubin 0.8 0.3 - 1.2 mg/dL   GFR calc non Af Amer >60 >60 mL/min   GFR calc Af Amer >60 >60 mL/min   Anion gap 9 5 - 15    Comment: Performed at [facility], [address], [place], [place] [address]  Magnesium     Status: None   Collection Time: 02/16/20  1:55 PM  Result Value Ref Range   Magnesium 2.1 1.7 - 2.4 mg/dL    Comment: Performed at [facility], [place], [place] [address]  Troponin I (High Sensitivity)     Status: None   Collection Time: 02/16/20  1:55 PM  Result Value Ref Range   Troponin I (High Sensitivity) 4 <18 ng/L    Comment:  (NOTE) Elevated high sensitivity troponin I (hsTnI) values and significant  changes across serial measurements may suggest ACS but many other  chronic and acute conditions are known to elevate hsTnI results.  Refer to the "Links" section for chest pain  algorithms and additional  guidance. Performed at [facility], [address] [place], [place] [address]   Protime-INR     Status: None   Collection Time: 02/16/20  1:55 PM  Result Value Ref Range   Prothrombin Time 12.6 11.4 - 15.2 seconds   INR 1.0 0.8 - 1.2    Comment: (NOTE) INR goal varies based on device and disease states. Performed at [facility], [address] [place], [place] [address]   APTT     Status: None   Collection Time: 02/16/20  1:55 PM  Result Value Ref Range   aPTT 27 24 - 36 seconds    Comment: Performed at [facility], [address] [place], [place] [address]  SARS Coronavirus 2 by RT PCR (hospital order, performed in [facility] hospital lab) Nasopharyngeal Nasopharyngeal Swab     Status: None   Collection Time: 02/16/20  2:45 PM   Specimen: Nasopharyngeal Swab  Result Value Ref Range   SARS Coronavirus 2 NEGATIVE NEGATIVE    Comment: (NOTE) SARS-CoV-2 target nucleic acids are NOT DETECTED.  The SARS-CoV-2 RNA is generally detectable in upper and lower respiratory specimens during the acute phase of infection. The lowest concentration of SARS-CoV-2 viral copies this assay can detect is 250 copies / mL. A negative result does not preclude SARS-CoV-2 infection and should not be used as the sole basis for treatment or other patient management decisions.  A negative result may occur with improper specimen collection / handling, submission of specimen other than nasopharyngeal swab, presence of viral mutation(s) within the areas targeted by this assay, and inadequate number of viral copies (<250 copies / mL). A negative result must be combined with clinical observations,  patient history, and epidemiological information.  Fact Sheet for Patients:   StrictlyIdeas.no  Fact Sheet for Healthcare Providers: BankingDealers.co.za  This test is not yet approved or  cleared by the [place] FDA and has been authorized for detection and/or diagnosis of SARS-CoV-2 by FDA under an Emergency Use Authorization (EUA).  This EUA will remain in effect (meaning this test can be used) for the duration of the COVID-19 declaration under Section 564(b)(1) of the Act, 21 U.S.C. section 360bbb-3(b)(1), unless the authorization is terminated or revoked sooner.  Performed at [facility], [place], [place] [address]   Troponin I (High Sensitivity)     Status: None   Collection Time: 02/16/20  3:41 PM  Result Value Ref Range   Troponin I (High Sensitivity) 4 <18 ng/L    Comment: (NOTE) Elevated high sensitivity troponin I (hsTnI) values and significant  changes across serial measurements may suggest ACS but many other  chronic and acute conditions are known to elevate hsTnI results.  Refer to the "Links" section for chest pain algorithms and additional  guidance. Performed at [facility], [place], [place] [address]   Heparin level (unfractionated)     Status: Abnormal   Collection Time: 02/16/20 10:32 PM  Result Value Ref Range   Heparin Unfractionated 0.71 (H) 0.30 - 0.70 IU/mL    Comment: (NOTE) If heparin results are below expected values, and patient dosage has  been confirmed, suggest follow up testing of antithrombin III levels. Performed at [facility], [address] [place], [place] [address]   HIV Antibody (routine testing w rflx)     Status: None   Collection Time: 02/17/20  4:14 AM  Result Value Ref Range   HIV Screen  4th Generation wRfx Non Reactive Non Reactive    Comment: Performed at [facility], [place] [address] [place],  [place] [address]  CBC     Status: Abnormal   Collection Time: 02/17/20  4:14 AM  Result Value Ref Range   WBC 5.9 4.0 - 10.5 K/uL   RBC 3.70 (L) 3.87 - 5.11 MIL/uL   Hemoglobin 12.1 12.0 - 15.0 g/dL   HCT 35.5 (L) 36 - 46 %   MCV 95.9 80.0 - 100.0 fL   MCH 32.7 26.0 - 34.0 pg   MCHC 34.1 30.0 - 36.0 g/dL   RDW 13.6 11.5 - 15.5 %   Platelets 212 150 - 400 K/uL   nRBC 0.0 0.0 - 0.2 %    Comment: Performed at [facility], [address], [place], [place] [address]  Basic metabolic panel     Status: Abnormal   Collection Time: 02/17/20  4:14 AM  Result Value Ref Range   Sodium 139 135 - 145 mmol/L   Potassium 3.2 (L) 3.5 - 5.1 mmol/L   Chloride 102 98 - 111 mmol/L   CO2 27 22 - 32 mmol/L   Glucose, Bld 106 (H) 70 - 99 mg/dL    Comment: Glucose reference range applies only to samples taken after fasting for at least 8 hours.   BUN 12 6 - 20 mg/dL   Creatinine, Ser 0.99 0.44 - 1.00 mg/dL   Calcium 9.2 8.9 - 10.3 mg/dL   GFR calc non Af Amer >60 >60 mL/min   GFR calc Af Amer >60 >60 mL/min   Anion gap 10 5 - 15    Comment: Performed at [facility], [place], [place] [address]  Heparin level (unfractionated)     Status: None   Collection Time: 02/17/20  4:14 AM  Result Value Ref Range   Heparin Unfractionated 0.68 0.30 - 0.70 IU/mL    Comment: (NOTE) If heparin results are below expected values, and patient dosage has  been confirmed, suggest follow up testing of antithrombin III levels. Performed at [facility], [place], [place] [address]   Heparin level (unfractionated)     Status: None   Collection Time: 02/17/20 10:39 AM  Result Value Ref Range   Heparin Unfractionated 0.62 0.30 - 0.70 IU/mL    Comment: (NOTE) If heparin results are below expected values, and patient dosage has  been confirmed, suggest follow up testing of antithrombin III levels. Performed at [facility], [address]  [place], [place] [address]    CT ANGIO CHEST PE W OR WO CONTRAST  Result Date: 02/16/2020 CLINICAL DATA:  Shortness of breath EXAM: CT ANGIOGRAPHY CHEST WITH CONTRAST TECHNIQUE: Multidetector CT imaging of the chest was performed using the standard protocol during bolus administration of intravenous contrast. Multiplanar CT image reconstructions and MIPs were obtained to evaluate the vascular anatomy. CONTRAST:  137mL OMNIPAQUE IOHEXOL 350 MG/ML SOLN COMPARISON:  Chest CT August 14, 2019 FINDINGS: Cardiovascular: There are pulmonary emboli arising from the distal main right pulmonary artery. There are multiple pulmonary emboli in lower lobe pulmonary artery branches, more severe on the right than on the left. The right ventricle to left ventricle diameter ratio is less than 0.9, not consistent with right heart strain. There is no appreciable thoracic aortic aneurysm or dissection. Visualized great vessels appear unremarkable. There is no appreciable pericardial effusion or pericardial thickening. Mediastinum/Nodes: Visualized thyroid appears unremarkable. There is no evident thoracic adenopathy.  No esophageal lesions are appreciable. Note that there is fairly extensive generalized mediastinal fat. Lungs/Pleura: No edema or airspace opacity evident. There is slight atelectatic change in the lung bases. No pulmonary infarcts are demonstrable. No pleural effusions appreciable. Upper Abdomen: There is hepatic steatosis. Gallbladder is absent. Visualized upper abdominal structures otherwise appear unremarkable. Musculoskeletal: There is stable anterior wedging of the T4 vertebral body. There are foci of degenerative change in the thoracic spine. No blastic or lytic bone lesions. No chest wall lesions. Review of the MIP images confirms the above findings. IMPRESSION: 1. Multiple pulmonary emboli bilaterally, more severe on the right than on the left. No appreciable right heart strain. 2. Atelectatic change in the lung  bases. No parenchymal lung edema or consolidation. 3.  No evident adenopathy. 4.  Hepatic steatosis. 5.  Gallbladder absent. Critical Value/emergent results were called by telephone at the time of interpretation on 02/16/2020 at 12:15 pm to provider [facility] , who verbally acknowledged these results. Electronically Signed   By: Lowella Grip III M.D.   On: 02/16/2020 12:15   US Venous Img Lower Bilateral (DVT)  Result Date: 02/16/2020 CLINICAL DATA:  Chronic right calf pain. EXAM: Bilateral LOWER EXTREMITY VENOUS DOPPLER ULTRASOUND TECHNIQUE: Gray-scale sonography with compression, as well as color and duplex ultrasound, were performed to evaluate the deep venous system(s) from the level of the common femoral vein through the popliteal and proximal calf veins. COMPARISON:  August 20, 2019. FINDINGS: VENOUS Normal compressibility of the common femoral, superficial femoral, and popliteal veins, as well as the visualized calf veins. Visualized portions of profunda femoral vein and great saphenous vein unremarkable. No filling defects to suggest DVT on grayscale or color Doppler imaging. Doppler waveforms show normal direction of venous flow, normal respiratory plasticity and response to augmentation. Limited views of the contralateral common femoral vein are unremarkable. OTHER None. Limitations: none IMPRESSION: No evidence of deep venous thrombosis seen in either lower extremity. Electronically Signed   By: Marijo Conception M.D.   On: 02/16/2020 16:42    Pending Labs Unresulted Labs (From admission, onward) Comment          Start     Ordered   02/18/20 0500  CBC  Tomorrow morning,   STAT        02/17/20 1114          Vitals/Pain Today's Vitals   02/17/20 0730 02/17/20 0800 02/17/20 0830 02/17/20 0840  BP: 117/76 (!) 133/89 (!) 132/68 (!) 132/68  Pulse: 91 87 90 87  Resp: 18 15 15 13   Temp:      TempSrc:      SpO2: 92% 93% 95% 99%  Weight:      Height:      PainSc:         Isolation Precautions No active isolations  Medications Medications  HYDROcodone-acetaminophen (NORCO/VICODIN) 5-325 MG per tablet 1 tablet (1 tablet Oral Given 02/16/20 2135)  SUMAtriptan (IMITREX) tablet 100 mg (has no administration in time range)  valACYclovir (VALTREX) tablet 500 mg (500 mg Oral Given 02/17/20 0843)  amLODipine (NORVASC) tablet 10 mg (10 mg Oral Given 02/17/20 0840)  hydrochlorothiazide (HYDRODIURIL) tablet 25 mg (25 mg Oral Given 02/17/20 0841)  losartan (COZAAR) tablet 100 mg (100 mg Oral Given 02/17/20 0841)  rosuvastatin (CRESTOR) tablet 10 mg (10 mg Oral Given 02/17/20 0842)  DULoxetine (CYMBALTA) DR capsule 30 mg (30 mg Oral Given 02/17/20 0841)  venlafaxine XR (EFFEXOR-XR) 24 hr capsule 75 mg (75 mg Oral Not  Given 02/17/20 1442)  levothyroxine (SYNTHROID) tablet 25 mcg (25 mcg Oral Given 02/17/20 0838)  meclizine (ANTIVERT) tablet 25 mg (has no administration in time range)  pantoprazole (PROTONIX) EC tablet 40 mg (40 mg Oral Given 02/17/20 0842)  mirabegron ER (MYRBETRIQ) tablet 25 mg (25 mg Oral Given 02/17/20 0840)  vitamin B-12 (CYANOCOBALAMIN) tablet 5,000 mcg (5,000 mcg Oral Given 02/17/20 0839)  baclofen (LIORESAL) tablet 10 mg (has no administration in time range)  Vitamin D (Ergocalciferol) (DRISDOL) capsule 50,000 Units (50,000 Units Oral Not Given 02/16/20 1708)  magnesium oxide (MAG-OX) tablet 400 mg (400 mg Oral Given 02/17/20 0839)  albuterol (PROVENTIL) (2.5 MG/3ML) 0.083% nebulizer solution 2.5 mg (has no administration in time range)  sodium chloride flush (NS) 0.9 % injection 3 mL (3 mLs Intravenous Given 02/17/20 0904)  sodium chloride flush (NS) 0.9 % injection 3 mL (has no administration in time range)  0.9 %  sodium chloride infusion (has no administration in time range)  ondansetron (ZOFRAN) tablet 4 mg (has no administration in time range)    Or  ondansetron (ZOFRAN) injection 4 mg (has no administration in time range)  acetaminophen (TYLENOL)  tablet 650 mg (650 mg Oral Given 02/16/20 1943)    Or  acetaminophen (TYLENOL) suppository 650 mg ( Rectal See Alternative 02/16/20 1943)  azithromycin (ZITHROMAX) tablet 500 mg (500 mg Oral Given 02/16/20 1737)  mometasone-formoterol (DULERA) 200-5 MCG/ACT inhaler 2 puff (2 puffs Inhalation Given 02/17/20 0846)  menthol-cetylpyridinium (CEPACOL) lozenge 3 mg (has no administration in time range)  ARIPiprazole (ABILIFY) tablet 5 mg (5 mg Oral Given 02/17/20 1443)  doxycycline (VIBRA-TABS) tablet 100 mg (100 mg Oral Given 02/17/20 1442)  traMADol (ULTRAM) tablet 50 mg (has no administration in time range)  apixaban (ELIQUIS) tablet 10 mg (10 mg Oral Given 02/17/20 1443)    Followed by  apixaban (ELIQUIS) tablet 5 mg (has no administration in time range)  heparin bolus via infusion 4,250 Units (4,250 Units Intravenous Bolus from Bag 02/16/20 1445)    Mobility walks Low fall risk   Focused Assessments    R Recommendations: See Admitting Provider Note  Report given to:

## 2020-02-18 ENCOUNTER — Inpatient Hospital Stay: Payer: 59

## 2020-02-18 DIAGNOSIS — I2699 Other pulmonary embolism without acute cor pulmonale: Secondary | ICD-10-CM | POA: Diagnosis not present

## 2020-02-18 LAB — ECHOCARDIOGRAM COMPLETE
Area-P 1/2: 3.6 cm2
Height: 65 in
S' Lateral: 3.22 cm
Weight: 4158.76 oz

## 2020-02-18 LAB — CBC
HCT: 35.6 % — ABNORMAL LOW (ref 36.0–46.0)
Hemoglobin: 12.6 g/dL (ref 12.0–15.0)
MCH: 32.7 pg (ref 26.0–34.0)
MCHC: 35.4 g/dL (ref 30.0–36.0)
MCV: 92.5 fL (ref 80.0–100.0)
Platelets: 221 10*3/uL (ref 150–400)
RBC: 3.85 MIL/uL — ABNORMAL LOW (ref 3.87–5.11)
RDW: 13.2 % (ref 11.5–15.5)
WBC: 7.1 10*3/uL (ref 4.0–10.5)
nRBC: 0 % (ref 0.0–0.2)

## 2020-02-18 MED ORDER — ONDANSETRON HCL 4 MG PO TABS: 4.0000 mg | ORAL_TABLET | Freq: Four times a day (QID) | ORAL | 0 refills | Status: DC | PRN

## 2020-02-18 MED ORDER — APIXABAN 5 MG PO TABS: 5.0000 mg | ORAL_TABLET | Freq: Two times a day (BID) | ORAL | 1 refills | Status: DC

## 2020-02-18 MED ORDER — GADOBUTROL 1 MMOL/ML IV SOLN
10.0000 mL | Freq: Once | INTRAVENOUS | Status: AC | PRN
  Administered 2020-02-18: 10 mL via INTRAVENOUS

## 2020-02-18 MED ORDER — APIXABAN 5 MG PO TABS: 10.0000 mg | ORAL_TABLET | Freq: Two times a day (BID) | ORAL | 0 refills | Status: DC

## 2020-02-18 NOTE — Progress Notes (Signed)
SATURATION QUALIFICATIONS: (This note is used to comply with regulatory documentation for home oxygen)  Patient Saturations on Room Air at Rest = 95%  Patient Saturations on Room Air while Ambulating = 91%  Patient Saturations on 0 Liters of oxygen while Ambulating = 91%  Please briefly explain why patient needs home oxygen: The patient walked 800 ft and up and down the stairs without any walking device.

## 2020-02-18 NOTE — Discharge Summary (Signed)
Physician Discharge Summary  Tammy Young QJJ:[id number] DOB: 1965-11-01 DOA: 02/16/2020  PCP: Toni Arthurs, NP  Admit date: 02/16/2020 Discharge date: 02/18/2020  Admitted From: Home Disposition:  Home Recommendations for Outpatient Follow-up:  1. Follow up with PCP in 1-2 weeks, I have stopped some of her antihypertensives due to soft blood pressure during the hospital stay. 2. Please obtain BMP/CBC in one week  Home Health none Equipment/Devices: None  Discharge Condition stable and improved CODE STATUS full code Diet recommendation: Cardiac Brief/Interim Summary:54 y.o.femalewith medical history significant forCOPD, hypertension, anxiety disorder, morbid obesity, obstructive sleep apnea, hypothyroidism and recent diagnosis of [facility] spotted fever on doxycycline who presents to the emergency room for evaluation of a 1 month history of worsening shortness of breath mostly with exertion associated with chest pain. She also complains of a cough which is productive of yellow phlegm mostly in the mornings. She denies having any fever chills or any sick contacts. Patient also complains of diarrhea which she has had for about 2 weeks and has stools have been tested for C. difficile which came back negative. She also complains of right calf swelling but denies having any pain. She denies nicotine use and does not have any history of malignancy. She denies recent surgery and is not on estrogen supplementation. Due to worsening of her symptoms she had a CT angiogram done as an outpatient which showed multiple pulmonary emboli bilaterally, more severe on the right than on the left. No appreciable right heart strain. Twelve-lead EKG shows sinus rhythm with PVCs She will be admitted to the hospital for further evaluation   ED Course:Patient is a 54 year old female who presents to the emergency room for a 1 month history of shortness of breath which has progressively worsened and  is associated with chest pain and a cough productive of yellow phlegm mostly in the mornings. Patient had a CT angiogram which showed multiple pulmonary emboli. She was started on heparin drip in the ER and will be admitted to the hospital for further evaluation.  Discharge Diagnoses:  Principal Problem:   Pulmonary embolism (HCC) Active Problems:   Obesity, Class III, BMI 40-49.9 (morbid obesity) ([place])   Essential hypertension   Gastroesophageal reflux disease   Hypothyroidism, unspecified   COPD with acute bronchitis ([place])   Pulmonary embolism-patient admitted with worsening shortness of breath over the past 1 month.  CT chest showed multiple PE.  She was started on heparin.  Then switched to Eliquis.  Lower extremity venous Doppler showed no evidence of DVT. Echocardiogram with ejection fraction 60 to 65%.  Normal right ventricular systolic function. She will need to continue Eliquis at least for 6 months since this is an unprovoked PE.  COPD with acute bronchitis Continue as needed bronchodilator therapy and inhaled steroids Patient is on doxycycline as an outpatient to finish a course of 4 weeks continue.    Morbid obesityBMI (48.18) Complicates overall prognosis and care   Hypothyroidism Continue Synthroid   Hypertension Continue losartan, HCTZ and amlodipine and clonidine.  Upon discharge I have continued just the clonidine due to soft blood pressure in the hospital.   Anxiety and depression continue outpatient medications of BuSpar.  [place] spotted fever patient reports her primary care physician has diagnosed her with [facility] spotted fever and is started on doxycycline for 4 weeks.  Please follow-up with PCP for fine further work-up.  Estimated body mass index is 43.25 kg/m as calculated from the following:   Height as of this  encounter: 5\' 5"  (1.651 m).   Weight as of this encounter: 117.9 kg.  Discharge Instructions  Discharge  Instructions    Diet - low sodium heart healthy   Complete by: As directed    Increase activity slowly   Complete by: As directed      Allergies as of 02/18/2020      Reactions   Amoxicillin Rash   Augmentin [amoxicillin-pot Clavulanate] Itching, Rash      Medication List    STOP taking these medications   amLODipine 10 MG tablet Commonly known as: NORVASC   hydrochlorothiazide 25 MG tablet Commonly known as: HYDRODIURIL     TAKE these medications   albuterol (2.5 MG/3ML) 0.083% nebulizer solution Commonly known as: PROVENTIL Inhale 2.5 mg into the lungs every 4 (four) hours as needed for wheezing or shortness of breath.   apixaban 5 MG Tabs tablet Commonly known as: ELIQUIS Take 2 tablets (10 mg total) by mouth 2 (two) times daily.   apixaban 5 MG Tabs tablet Commonly known as: ELIQUIS Take 1 tablet (5 mg total) by mouth 2 (two) times daily. Start taking on: February 24, 2020   ARIPiprazole 5 MG tablet Commonly known as: ABILIFY Take 5 mg by mouth daily.   budesonide 0.5 MG/2ML nebulizer solution Commonly known as: PULMICORT Take 0.5 mg by nebulization 2 (two) times daily.   budesonide-formoterol 160-4.5 MCG/ACT inhaler Commonly known as: SYMBICORT Inhale 2 puffs into the lungs daily.   Cholecalciferol 50 MCG (2000 UT) Caps Take 2,000 Units by mouth daily.   cloNIDine 0.1 MG tablet Commonly known as: CATAPRES Take 0.1 mg by mouth 2 (two) times daily.   doxycycline 100 MG tablet Commonly known as: VIBRA-TABS Take 100 mg by mouth 2 (two) times daily.   ergocalciferol 1.25 MG (50000 UT) capsule Commonly known as: VITAMIN D2 Take 50,000 Units by mouth every Monday.   escitalopram 20 MG tablet Commonly known as: LEXAPRO Take 20 mg by mouth daily.   gabapentin 600 MG tablet Commonly known as: NEURONTIN Take 300 mg by mouth 3 (three) times daily as needed (pain).   Linzess 72 MCG capsule Generic drug: linaclotide Take 72 mcg by mouth daily before  breakfast.   meclizine 25 MG tablet Commonly known as: ANTIVERT Take 25 mg by mouth 3 (three) times daily as needed for dizziness.   methocarbamol 750 MG tablet Commonly known as: ROBAXIN Take 375-750 mg by mouth 2 (two) times daily as needed for muscle spasms.   ondansetron 4 MG disintegrating tablet Commonly known as: ZOFRAN-ODT Take 4 mg by mouth every 8 (eight) hours as needed for nausea or vomiting.   ondansetron 4 MG tablet Commonly known as: ZOFRAN Take 1 tablet (4 mg total) by mouth every 6 (six) hours as needed for nausea.   pantoprazole 40 MG tablet Commonly known as: PROTONIX Take 40 mg by mouth daily.   rosuvastatin 10 MG tablet Commonly known as: CRESTOR Take 10 mg by mouth daily.   SUMAtriptan 100 MG tablet Commonly known as: IMITREX Take 100 mg by mouth every 2 (two) hours as needed.   traMADol 50 MG tablet Commonly known as: ULTRAM Take 50 mg by mouth every 6 (six) hours as needed for moderate pain.   valACYclovir 500 MG tablet Commonly known as: VALTREX Take 500 mg by mouth 2 (two) times daily.       Follow-up Information    Toni Arthurs, NP Follow up.   Specialty: Family Medicine Contact information: [address]  [place] [address] [phone]              Allergies  Allergen Reactions  . Amoxicillin Rash  . Augmentin [Amoxicillin-Pot Clavulanate] Itching and Rash    Consultations: None  Procedures/Studies: CT ANGIO CHEST PE W OR WO CONTRAST  Result Date: 02/16/2020 CLINICAL DATA:  Shortness of breath EXAM: CT ANGIOGRAPHY CHEST WITH CONTRAST TECHNIQUE: Multidetector CT imaging of the chest was performed using the standard protocol during bolus administration of intravenous contrast. Multiplanar CT image reconstructions and MIPs were obtained to evaluate the vascular anatomy. CONTRAST:  134mL OMNIPAQUE IOHEXOL 350 MG/ML SOLN COMPARISON:  Chest CT August 14, 2019 FINDINGS: Cardiovascular: There are pulmonary emboli  arising from the distal main right pulmonary artery. There are multiple pulmonary emboli in lower lobe pulmonary artery branches, more severe on the right than on the left. The right ventricle to left ventricle diameter ratio is less than 0.9, not consistent with right heart strain. There is no appreciable thoracic aortic aneurysm or dissection. Visualized great vessels appear unremarkable. There is no appreciable pericardial effusion or pericardial thickening. Mediastinum/Nodes: Visualized thyroid appears unremarkable. There is no evident thoracic adenopathy. No esophageal lesions are appreciable. Note that there is fairly extensive generalized mediastinal fat. Lungs/Pleura: No edema or airspace opacity evident. There is slight atelectatic change in the lung bases. No pulmonary infarcts are demonstrable. No pleural effusions appreciable. Upper Abdomen: There is hepatic steatosis. Gallbladder is absent. Visualized upper abdominal structures otherwise appear unremarkable. Musculoskeletal: There is stable anterior wedging of the T4 vertebral body. There are foci of degenerative change in the thoracic spine. No blastic or lytic bone lesions. No chest wall lesions. Review of the MIP images confirms the above findings. IMPRESSION: 1. Multiple pulmonary emboli bilaterally, more severe on the right than on the left. No appreciable right heart strain. 2. Atelectatic change in the lung bases. No parenchymal lung edema or consolidation. 3.  No evident adenopathy. 4.  Hepatic steatosis. 5.  Gallbladder absent. Critical Value/emergent results were called by telephone at the time of interpretation on 02/16/2020 at 12:15 pm to provider [facility] , who verbally acknowledged these results. Electronically Signed   By: Lowella Grip III M.D.   On: 02/16/2020 12:15   MR BRAIN W WO CONTRAST  Result Date: 02/18/2020 CLINICAL DATA:  Initial evaluation for chronic headaches over past year, intracranial hemorrhage suspected.  EXAM: MRI HEAD WITHOUT AND WITH CONTRAST TECHNIQUE: Multiplanar, multiecho pulse sequences of the brain and surrounding structures were obtained without and with intravenous contrast. CONTRAST:  24mL GADAVIST GADOBUTROL 1 MMOL/ML IV SOLN COMPARISON:  Prior study from 08/20/2019. FINDINGS: Brain: Cerebral volume stable, and remains within normal limits for age. Minimal scattered patchy T2/FLAIR hyperintensity seen involving the periventricular white matter, nonspecific, but likely related chronic small vessel ischemic disease, stable. 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 No abnormal foci of restricted diffusion to suggest acute or subacute ischemia. Gray-white matter differentiation maintained. No encephalomalacia to suggest chronic cortical infarction elsewhere within the brain. No foci of susceptibility artifact to suggest acute or chronic intracranial hemorrhage. No mass lesion, midline shift, or mass effect. No hydrocephalus or extra-axial fluid collection. Pituitary gland suprasellar region normal. Midline structures intact. No abnormal enhancement. Vascular: Major intracranial vascular flow voids are well maintained. Major dural sinuses appear grossly patent. Skull and upper cervical spine: Craniocervical junction within normal limits. Bone marrow signal intensity normal. No scalp soft tissue abnormality. Sinuses/Orbits: Globes and orbital soft tissues within normal limits.  Paranasal sinuses are largely clear. No mastoid effusion. Inner ear structures grossly normal. Other: None. IMPRESSION: 1. No acute intracranial abnormality. 2. 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 3. Mild chronic microvascular ischemic disease, stable. Electronically Signed   By: Jeannine Boga M.D.   On: 02/18/2020 04:15   US Venous Img Lower Bilateral (DVT)  Result Date: 02/16/2020 CLINICAL DATA:  Chronic right calf pain. EXAM:  Bilateral LOWER EXTREMITY VENOUS DOPPLER ULTRASOUND TECHNIQUE: Gray-scale sonography with compression, as well as color and duplex ultrasound, were performed to evaluate the deep venous system(s) from the level of the common femoral vein through the popliteal and proximal calf veins. COMPARISON:  August 20, 2019. FINDINGS: VENOUS Normal compressibility of the common femoral, superficial femoral, and popliteal veins, as well as the visualized calf veins. Visualized portions of profunda femoral vein and great saphenous vein unremarkable. No filling defects to suggest DVT on grayscale or color Doppler imaging. Doppler waveforms show normal direction of venous flow, normal respiratory plasticity and response to augmentation. Limited views of the contralateral common femoral vein are unremarkable. OTHER None. Limitations: none IMPRESSION: No evidence of deep venous thrombosis seen in either lower extremity. Electronically Signed   By: Marijo Conception M.D.   On: 02/16/2020 16:42   ECHOCARDIOGRAM COMPLETE  Result Date: 02/18/2020    ECHOCARDIOGRAM REPORT   Patient Name:   Tammy Young Date of Exam: 02/16/2020 Medical Rec #:  [id number]       Height:       65.0 in Accession #:    [id number]      Weight:       259.9 lb Date of Birth:  October 14, 1965      BSA:          2.211 m Patient Age:    71 years        BP:           125/81 mmHg Patient Gender: F               HR:           92 bpm. Exam Location:  [facility] Procedure: 2D Echo, Cardiac Doppler and Color Doppler Indications:     I42.9 Cardiomyopathy  History:         Patient has no prior history of Echocardiogram examinations.                  Risk Factors:Morbid obesity, Hypertension and Dyslipidemia.                  Pre-diabetes. Fatty liver disease. Sleep apnea. Hypothyroidism.  Sonographer:     Wilford Sports Rodgers-Jones Referring Phys:  [place] Diagnosing Phys: Serafina Royals MD IMPRESSIONS  1. Left ventricular ejection fraction, by estimation, is 60 to 65%.  The left ventricle has normal function. The left ventricle has no regional wall motion abnormalities. Left ventricular diastolic parameters were normal.  2. Right ventricular systolic function is normal. The right ventricular size is normal.  3. The mitral valve is normal in structure. Trivial mitral valve regurgitation.  4. The aortic valve is normal in structure. Aortic valve regurgitation is not visualized. FINDINGS  Left Ventricle: Left ventricular ejection fraction, by estimation, is 60 to 65%. The left ventricle has normal function. The left ventricle has no regional wall motion abnormalities. The left ventricular internal cavity size was normal in size. There is  no left ventricular hypertrophy. Left ventricular diastolic parameters were normal. Right Ventricle: The right  ventricular size is normal. No increase in right ventricular wall thickness. Right ventricular systolic function is normal. Left Atrium: Left atrial size was normal in size. Right Atrium: Right atrial size was normal in size. Pericardium: There is no evidence of pericardial effusion. Mitral Valve: The mitral valve is normal in structure. Trivial mitral valve regurgitation. Tricuspid Valve: The tricuspid valve is normal in structure. Tricuspid valve regurgitation is trivial. Aortic Valve: The aortic valve is normal in structure. Aortic valve regurgitation is not visualized. Pulmonic Valve: The pulmonic valve was normal in structure. Pulmonic valve regurgitation is not visualized. Aorta: The aortic root and ascending aorta are structurally normal, with no evidence of dilitation. IAS/Shunts: No atrial level shunt detected by color flow Doppler.  LEFT VENTRICLE PLAX 2D LVIDd:         4.93 cm  Diastology LVIDs:         3.22 cm  LV e' lateral:   3.81 cm/s LV PW:         0.96 cm  LV E/e' lateral: 17.7 LV IVS:        0.83 cm  LV e' medial:    4.90 cm/s LVOT diam:     2.10 cm  LV E/e' medial:  13.7 LV SV:         60 LV SV Index:   27 LVOT Area:      3.46 cm  RIGHT VENTRICLE RV Basal diam:  3.07 cm RV S prime:     14.40 cm/s TAPSE (M-mode): 1.4 cm LEFT ATRIUM             Index       RIGHT ATRIUM          Index LA diam:        4.20 cm 1.90 cm/m  RA Area:     9.92 cm LA Vol (A2C):   32.3 ml 14.61 ml/m RA Volume:   21.00 ml 9.50 ml/m LA Vol (A4C):   32.9 ml 14.88 ml/m LA Biplane Vol: 33.7 ml 15.24 ml/m  AORTIC VALVE LVOT Vmax:   96.80 cm/s LVOT Vmean:  71.150 cm/s LVOT VTI:    0.172 m  AORTA Ao Root diam: 3.10 cm MITRAL VALVE MV Area (PHT): 3.60 cm    SHUNTS MV Decel Time: 211 msec    Systemic VTI:  0.17 m MV E velocity: 67.30 cm/s  Systemic Diam: 2.10 cm MV A velocity: 82.30 cm/s MV E/A ratio:  0.82 Serafina Royals MD Electronically signed by Serafina Royals MD Signature Date/Time: 02/18/2020/1:50:39 PM    Final     (Echo, Carotid, EGD, Colonoscopy, ERCP)    Subjective:  She is resting in bed awake alert continues to complain of a headache she reports she was followed by neurology in the past she does not remember who saw her.  Denies any changes with her vision or dizziness.  She ambulated with staff prior to discharge.  Discharge Exam: Vitals:   02/18/20 0856 02/18/20 1236  BP: 111/68 125/80  Pulse: 95 (!) 107  Resp: 18   Temp: 98 F (36.7 C) 98 F (36.7 C)  SpO2: 91% 94%   Vitals:   02/18/20 0010 02/18/20 0409 02/18/20 0856 02/18/20 1236  BP: (!) 125/86 126/73 111/68 125/80  Pulse: 102 86 95 (!) 107  Resp: 20 17 18    Temp: 98.4 F (36.9 C) 98.1 F (36.7 C) 98 F (36.7 C) 98 F (36.7 C)  TempSrc: Oral Oral Oral Oral  SpO2: (!) 89% (!) 89% 91%  94%  Weight:      Height:        General: Pt is alert, awake, not in acute distress Cardiovascular: RRR, S1/S2 +, no rubs, no gallops Respiratory: CTA bilaterally, no wheezing, no rhonchi Abdominal: Soft, NT, ND, bowel sounds + Extremities: no edema, no cyanosis    The results of significant diagnostics from this hospitalization (including imaging, microbiology, ancillary  and laboratory) are listed below for reference.     Microbiology: Recent Results (from the past 240 hour(s))  SARS Coronavirus 2 by RT PCR (hospital order, performed in [facility] hospital lab) Nasopharyngeal Nasopharyngeal Swab     Status: None   Collection Time: 02/16/20  2:45 PM   Specimen: Nasopharyngeal Swab  Result Value Ref Range Status   SARS Coronavirus 2 NEGATIVE NEGATIVE Final    Comment: (NOTE) SARS-CoV-2 target nucleic acids are NOT DETECTED.  The SARS-CoV-2 RNA is generally detectable in upper and lower respiratory specimens during the acute phase of infection. The lowest concentration of SARS-CoV-2 viral copies this assay can detect is 250 copies / mL. A negative result does not preclude SARS-CoV-2 infection and should not be used as the sole basis for treatment or other patient management decisions.  A negative result may occur with improper specimen collection / handling, submission of specimen other than nasopharyngeal swab, presence of viral mutation(s) within the areas targeted by this assay, and inadequate number of viral copies (<250 copies / mL). A negative result must be combined with clinical observations, patient history, and epidemiological information.  Fact Sheet for Patients:   StrictlyIdeas.no  Fact Sheet for Healthcare Providers: BankingDealers.co.za  This test is not yet approved or  cleared by the [place] FDA and has been authorized for detection and/or diagnosis of SARS-CoV-2 by FDA under an Emergency Use Authorization (EUA).  This EUA will remain in effect (meaning this test can be used) for the duration of the COVID-19 declaration under Section 564(b)(1) of the Act, 21 U.S.C. section 360bbb-3(b)(1), unless the authorization is terminated or revoked sooner.  Performed at [facility], [address] [place], [place] [address]      Labs: BNP (last 3 results) No results for  input(s): BNP in the last 8760 hours. Basic Metabolic Panel: Recent Labs  Lab 02/16/20 1355 02/17/20 0414  NA 138 139  K 3.5 3.2*  CL 105 102  CO2 24 27  GLUCOSE 96 106*  BUN 12 12  CREATININE 0.74 0.99  CALCIUM 9.0 9.2  MG 2.1  --    Liver Function Tests: Recent Labs  Lab 02/16/20 1355  AST 26  ALT 35  ALKPHOS 45  BILITOT 0.8  PROT 7.0  ALBUMIN 4.0   No results for input(s): LIPASE, AMYLASE in the last 168 hours. No results for input(s): AMMONIA in the last 168 hours. CBC: Recent Labs  Lab 02/16/20 1355 02/17/20 0414 02/18/20 0420  WBC 6.9 5.9 7.1  NEUTROABS 2.8  --   --   HGB 13.1 12.1 12.6  HCT 38.1 35.5* 35.6*  MCV 95.0 95.9 92.5  PLT 219 212 221   Cardiac Enzymes: No results for input(s): CKTOTAL, CKMB, CKMBINDEX, TROPONINI in the last 168 hours. BNP: Invalid input(s): POCBNP CBG: No results for input(s): GLUCAP in the last 168 hours. D-Dimer No results for input(s): DDIMER in the last 72 hours. Hgb A1c No results for input(s): HGBA1C in the last 72 hours. Lipid Profile No results for input(s): CHOL, HDL, LDLCALC, TRIG, CHOLHDL, LDLDIRECT in the last 72  hours. Thyroid function studies No results for input(s): TSH, T4TOTAL, T3FREE, THYROIDAB in the last 72 hours.  Invalid input(s): FREET3 Anemia work up No results for input(s): VITAMINB12, FOLATE, FERRITIN, TIBC, IRON, RETICCTPCT in the last 72 hours. Urinalysis    Component Value Date/Time   COLORURINE YELLOW (A) 10/08/2016 0842   APPEARANCEUR Clear 11/08/2016 0930   LABSPEC 1.010 10/08/2016 0842   LABSPEC 1.025 03/09/2014 1832   PHURINE 5.0 10/08/2016 0842   GLUCOSEU Negative 11/08/2016 0930   GLUCOSEU Negative 03/09/2014 1832   HGBUR LARGE (A) 10/08/2016 0842   BILIRUBINUR Negative 11/08/2016 0930   BILIRUBINUR Negative 03/09/2014 1832   KETONESUR NEGATIVE 10/08/2016 0842   PROTEINUR Negative 11/08/2016 0930   PROTEINUR NEGATIVE 10/08/2016 0842   NITRITE Negative 11/08/2016 0930    NITRITE NEGATIVE 10/08/2016 0842   LEUKOCYTESUR Negative 11/08/2016 0930   LEUKOCYTESUR Negative 03/09/2014 1832   Sepsis Labs Invalid input(s): PROCALCITONIN,  WBC,  LACTICIDVEN Microbiology Recent Results (from the past 240 hour(s))  SARS Coronavirus 2 by RT PCR (hospital order, performed in [place] hospital lab) Nasopharyngeal Nasopharyngeal Swab     Status: None   Collection Time: 02/16/20  2:45 PM   Specimen: Nasopharyngeal Swab  Result Value Ref Range Status   SARS Coronavirus 2 NEGATIVE NEGATIVE Final    Comment: (NOTE) SARS-CoV-2 target nucleic acids are NOT DETECTED.  The SARS-CoV-2 RNA is generally detectable in upper and lower respiratory specimens during the acute phase of infection. The lowest concentration of SARS-CoV-2 viral copies this assay can detect is 250 copies / mL. A negative result does not preclude SARS-CoV-2 infection and should not be used as the sole basis for treatment or other patient management decisions.  A negative result may occur with improper specimen collection / handling, submission of specimen other than nasopharyngeal swab, presence of viral mutation(s) within the areas targeted by this assay, and inadequate number of viral copies (<250 copies / mL). A negative result must be combined with clinical observations, patient history, and epidemiological information.  Fact Sheet for Patients:   StrictlyIdeas.no  Fact Sheet for Healthcare Providers: BankingDealers.co.za  This test is not yet approved or  cleared by the [place] FDA and has been authorized for detection and/or diagnosis of SARS-CoV-2 by FDA under an Emergency Use Authorization (EUA).  This EUA will remain in effect (meaning this test can be used) for the duration of the COVID-19 declaration under Section 564(b)(1) of the Act, 21 U.S.C. section 360bbb-3(b)(1), unless the authorization is terminated or revoked  sooner.  Performed at [facility], [address], [place], [place] [address]      Time coordinating discharge:  39 minutes  SIGNED:   Georgette Shell, MD  [facility] 02/18/2020, 2:15 PM Pager   If 7PM-7AM, please contact night-coverage [email or web address] Password [email or web address]

## 2020-02-18 NOTE — Plan of Care (Signed)
The patient has been discharged. IV has been removed. Education has been performed and the teach back method has been utilized. Follow up appointments have been created for the patient. Problem: Education: Goal: Knowledge of General Education information will improve Description: Including pain rating scale, medication(s)/side effects and non-pharmacologic comfort measures Outcome: Completed/Met   Problem: Health Behavior/Discharge Planning: Goal: Ability to manage health-related needs will improve Outcome: Completed/Met   Problem: Clinical Measurements: Goal: Ability to maintain clinical measurements within normal limits will improve Outcome: Completed/Met Goal: Will remain free from infection Outcome: Completed/Met Goal: Diagnostic test results will improve Outcome: Completed/Met Goal: Respiratory complications will improve Outcome: Completed/Met Goal: Cardiovascular complication will be avoided Outcome: Completed/Met   Problem: Activity: Goal: Risk for activity intolerance will decrease Outcome: Completed/Met   Problem: Nutrition: Goal: Adequate nutrition will be maintained Outcome: Completed/Met   Problem: Coping: Goal: Level of anxiety will decrease Outcome: Completed/Met   Problem: Elimination: Goal: Will not experience complications related to bowel motility Outcome: Completed/Met Goal: Will not experience complications related to urinary retention Outcome: Completed/Met   Problem: Pain Managment: Goal: General experience of comfort will improve Outcome: Completed/Met   Problem: Safety: Goal: Ability to remain free from injury will improve Outcome: Completed/Met   Problem: Skin Integrity: Goal: Risk for impaired skin integrity will decrease Outcome: Completed/Met

## 2020-02-18 NOTE — Progress Notes (Signed)
Back from MRI; Telemetry reconnected; 4 P's assessed/completed. Norco given for 10/10 H/A; Yohannes Waibel K, RN 1:40 AM 02/18/2020

## 2020-02-19 ENCOUNTER — Ambulatory Visit: Admission: RE | Admit: 2020-02-19 | Payer: 59 | Source: Ambulatory Visit

## 2020-02-19 LAB — THYROID PANEL WITH TSH
Free Thyroxine Index: 2 (ref 1.2–4.9)
T3 Uptake Ratio: 22 % — ABNORMAL LOW (ref 24–39)
T4, Total: 8.9 ug/dL (ref 4.5–12.0)
TSH: 2.63 u[IU]/mL (ref 0.450–4.500)

## 2020-05-02 ENCOUNTER — Emergency Department
Admission: EM | Admit: 2020-05-02 | Discharge: 2020-05-03 | Disposition: A | Discharge status: Home / Self Care | Payer: 59 | Attending: Emergency Medicine | Admitting: Emergency Medicine

## 2020-05-02 ENCOUNTER — Encounter: Payer: Self-pay | Admitting: Emergency Medicine

## 2020-05-02 ENCOUNTER — Emergency Department: Payer: 59

## 2020-05-02 ENCOUNTER — Other Ambulatory Visit: Payer: Self-pay

## 2020-05-02 DIAGNOSIS — R0602 Shortness of breath: Secondary | ICD-10-CM | POA: Insufficient documentation

## 2020-05-02 DIAGNOSIS — N183 Chronic kidney disease, stage 3 unspecified: Secondary | ICD-10-CM | POA: Diagnosis not present

## 2020-05-02 DIAGNOSIS — Z79899 Other long term (current) drug therapy: Secondary | ICD-10-CM | POA: Insufficient documentation

## 2020-05-02 DIAGNOSIS — I129 Hypertensive chronic kidney disease with stage 1 through stage 4 chronic kidney disease, or unspecified chronic kidney disease: Secondary | ICD-10-CM | POA: Diagnosis not present

## 2020-05-02 DIAGNOSIS — Z7901 Long term (current) use of anticoagulants: Secondary | ICD-10-CM | POA: Insufficient documentation

## 2020-05-02 DIAGNOSIS — E860 Dehydration: Secondary | ICD-10-CM | POA: Diagnosis not present

## 2020-05-02 DIAGNOSIS — E039 Hypothyroidism, unspecified: Secondary | ICD-10-CM | POA: Diagnosis not present

## 2020-05-02 DIAGNOSIS — R202 Paresthesia of skin: Secondary | ICD-10-CM | POA: Diagnosis not present

## 2020-05-02 DIAGNOSIS — R42 Dizziness and giddiness: Secondary | ICD-10-CM | POA: Diagnosis present

## 2020-05-02 DIAGNOSIS — R55 Syncope and collapse: Secondary | ICD-10-CM | POA: Diagnosis not present

## 2020-05-02 DIAGNOSIS — Z8616 Personal history of COVID-19: Secondary | ICD-10-CM | POA: Diagnosis not present

## 2020-05-02 DIAGNOSIS — J440 Chronic obstructive pulmonary disease with acute lower respiratory infection: Secondary | ICD-10-CM | POA: Insufficient documentation

## 2020-05-02 DIAGNOSIS — Z7722 Contact with and (suspected) exposure to environmental tobacco smoke (acute) (chronic): Secondary | ICD-10-CM | POA: Insufficient documentation

## 2020-05-02 LAB — DIFFERENTIAL
Abs Immature Granulocytes: 0.07 10*3/uL (ref 0.00–0.07)
Basophils Absolute: 0.1 10*3/uL (ref 0.0–0.1)
Basophils Relative: 1 %
Eosinophils Absolute: 0.2 10*3/uL (ref 0.0–0.5)
Eosinophils Relative: 1 %
Immature Granulocytes: 1 %
Lymphocytes Relative: 35 %
Lymphs Abs: 4.2 10*3/uL — ABNORMAL HIGH (ref 0.7–4.0)
Monocytes Absolute: 1.5 10*3/uL — ABNORMAL HIGH (ref 0.1–1.0)
Monocytes Relative: 12 %
Neutro Abs: 5.9 10*3/uL (ref 1.7–7.7)
Neutrophils Relative %: 50 %

## 2020-05-02 LAB — CBC
HCT: 40.8 % (ref 36.0–46.0)
Hemoglobin: 13.7 g/dL (ref 12.0–15.0)
MCH: 31.8 pg (ref 26.0–34.0)
MCHC: 33.6 g/dL (ref 30.0–36.0)
MCV: 94.7 fL (ref 80.0–100.0)
Platelets: 225 10*3/uL (ref 150–400)
RBC: 4.31 MIL/uL (ref 3.87–5.11)
RDW: 12.5 % (ref 11.5–15.5)
WBC: 11.8 10*3/uL — ABNORMAL HIGH (ref 4.0–10.5)
nRBC: 0 % (ref 0.0–0.2)

## 2020-05-02 LAB — COMPREHENSIVE METABOLIC PANEL
ALT: 20 U/L (ref 0–44)
AST: 18 U/L (ref 15–41)
Albumin: 3.7 g/dL (ref 3.5–5.0)
Alkaline Phosphatase: 51 U/L (ref 38–126)
Anion gap: 11 (ref 5–15)
BUN: 22 mg/dL — ABNORMAL HIGH (ref 6–20)
CO2: 23 mmol/L (ref 22–32)
Calcium: 8.6 mg/dL — ABNORMAL LOW (ref 8.9–10.3)
Chloride: 105 mmol/L (ref 98–111)
Creatinine, Ser: 0.84 mg/dL (ref 0.44–1.00)
GFR, Estimated: >60 mL/min (ref >60)
Glucose, Bld: 91 mg/dL (ref 70–99)
Potassium: 3.6 mmol/L (ref 3.5–5.1)
Sodium: 139 mmol/L (ref 135–145)
Total Bilirubin: 0.7 mg/dL (ref 0.3–1.2)
Total Protein: 6.6 g/dL (ref 6.5–8.1)

## 2020-05-02 LAB — PROTIME-INR
INR: 0.9 (ref 0.8–1.2)
Prothrombin Time: 11.6 seconds (ref 11.4–15.2)

## 2020-05-02 LAB — APTT: aPTT: 28 seconds (ref 24–36)

## 2020-05-02 MED ORDER — LACTATED RINGERS IV BOLUS
1000.0000 mL | Freq: Once | INTRAVENOUS | Status: AC
  Administered 2020-05-03: 1000 mL via INTRAVENOUS

## 2020-05-02 MED ORDER — ACETAMINOPHEN 500 MG PO TABS
1000.0000 mg | ORAL_TABLET | Freq: Once | ORAL | Status: AC
  Administered 2020-05-03: 1000 mg via ORAL
  Filled 2020-05-02: qty 2

## 2020-05-02 NOTE — ED Triage Notes (Signed)
Pt presents to ED via POV with c/o SOB, and dizziness that started at approx 0900 this morning. Pt states initially attributed dizziness to not eating. Pt states felt like her face was throbbing, L arm doesn't feel quite normal, pt states had some difficulty speaking, however, now is able to speak in full and complete sentences without difficulty. Pt A&O x4. LKW 0900.

## 2020-05-02 NOTE — ED Provider Notes (Signed)
[facility] Emergency Department Provider Note ____________________________________________   First MD Initiated Contact with Patient 05/02/20 2254     (approximate)  I have reviewed the triage vital signs and the nursing notes.  HISTORY  Chief Complaint Dizziness and Shortness of Breath   HPI Tammy Young is a 54 y.o. femalewho presents to the ED for evaluation of shortness of breath and dizziness.   Chart review indicates history obesity, recently diagnosed with COVID-19 on 04/18/2020.  Received outpatient antibody infusion on 10/1. Otherwise history of HTN, HLD, COPD, GERD.  Eliquis on medication list.  Bilateral PEs diagnosed on 7/27.    Patient presents the ED for evaluation of continued shortness of breath without worsening, as well as presyncopal lightheaded dizziness this morning.  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  She reports she often feels "little bit dizzy" in the mornings, but reports today felt worse.  She reports lightheaded dizziness and presyncope with standing does self resolved, but with associated paresthesias to her bilateral hands and her face.  Her supervisor advised that she goes to an adjacent rescue squad, her blood pressure was checked, noted to be slightly hypertensive, and with elevated heart rate to the 110s.  She reports that they rechecked her heart rate a few minutes later, it was still elevated, so they advised that she goes to the ED.  She has been waiting in her waiting room for approximately 12 hours prior to my evaluation due to prolonged wait times and staffing issues in the ED.  She reports tolerating food in this time without postprandial nausea, vomiting or abdominal pain.  She reports not drinking any water because she was uncertain if she was able to.  Seated, she reports feeling "okay."  She reports shortness of breath at baseline for the past multiple weeks  in the setting of COVID-19 and bilateral PEs on Eliquis.  She reports compliance with Eliquis.  Denies worsening chest pain, fever, productive cough, vomiting, abdominal pain, dysuria or diarrhea.    Past Medical History:  Diagnosis Date  . Anxiety and depression   . Arthritis   . Collagen vascular disease (HCC)    RA  . Fatty liver disease, nonalcoholic   . GERD (gastroesophageal reflux disease)   . Headache   . Herpes simplex vulvovaginitis   . History of kidney stones   . History of stomach ulcers   . HLD (hyperlipidemia)   . Hypertension   . Hypothyroid   . Hypothyroidism   . Liver disease   . Lymphedema of right lower extremity   . Morbid obesity ([place])   . Multiple gastric ulcers   . PONV (postoperative nausea and vomiting)   . Pre-diabetes   . 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  . Seizures ([place])    only had with preeclampsia during pregnancy at age 66. none since  . Sleep apnea   . Stress incontinence (female) (female)   . Vitamin B 12 deficiency   . Vitamin D deficiency     Patient Active Problem List   Diagnosis Date Noted  . Pulmonary embolism ([place]) 02/16/2020  . COPD with acute bronchitis ([place]) 02/16/2020  . 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 Chronic pain syndrome 10/22/2019  . Chronic bilateral low back pain without sciatica 10/22/2019  . SI (sacroiliac) joint inflammation ([place]) 10/22/2019  . Bilateral hip  pain 10/22/2019  . Insect bite of left elbow 10/06/2019  . Pruritic rash 09/11/2019  . Osteopenia of multiple sites 08/31/2019  . Swelling of limb 08/28/2019  . Pain in limb 08/28/2019  . Varicose veins of leg with pain, right 08/20/2019  . Stroke-like symptoms 08/20/2019  . Prediabetes 08/18/2019  . Vitamin B12 deficiency 08/18/2019  . Vitamin D deficiency 08/18/2019  . Lymphedema of right lower extremity 08/14/2019  . Stress incontinence in female 08/14/2019  . Recurrent  major depressive disorder, in partial remission ([place]) 01/14/2018  . Obstructive sleep apnea syndrome 10/18/2017  . Sialadenitis 10/17/2017  . SOB (shortness of breath) 09/15/2017  . Chronic cough 08/01/2017  . Obstructive uropathy 10/08/2016  . Ureterolithiasis   . Acute right flank pain   . Intractable pain   . Chronic renal disease, stage 3, moderately decreased glomerular filtration rate between 30-59 mL/min/1.73 square meter (HCC) 07/02/2016  . Essential hypertension 06/22/2016  . Intractable chronic migraine without aura and with status migrainosus 12/02/2015  . Chronic daily headache 11/16/2015  . Tension headache 11/16/2015  . Chronic pansinusitis 10/24/2015  . Chronic tension-type headache, intractable 10/24/2015  . Hemorrhoids 10/24/2015  . Hyperlipidemia, unspecified 10/24/2015  . Herpesviral vulvovaginitis 11/17/2014  . Multiple gastric ulcers 04/16/2014  . Obesity, Class III, BMI 40-49.9 (morbid obesity) ([place]) 03/17/2014  . Epigastric pain 03/17/2014  . Fatty (change of) liver, not elsewhere classified 03/17/2014  . Gastroesophageal reflux disease 03/17/2014  . Hypothyroidism, unspecified 10/23/2012  . Thoracic back pain 10/23/2012  . Myalgia, other site 10/08/2012  . Calculus of kidney 05/30/2011  . Carpal tunnel syndrome 05/30/2011    Past Surgical History:  Procedure Laterality Date  . BREAST SURGERY     reduction  . CARPAL TUNNEL RELEASE Bilateral   . CHOLECYSTECTOMY    . CYSTOSCOPY WITH URETEROSCOPY, STONE BASKETRY AND STENT PLACEMENT  10/09/2016   Procedure: CYSTOSCOPY WITH URETEROSCOPY, STONE BASKETRY AND STENT PLACEMENT;  Surgeon: Hollice Espy, MD;  Location: [facility] ORS;  Service: Urology;;  . DIRECT LARYNGOSCOPY Left 04/15/2015   Procedure: DIRECT LARYNGOSCOPY BIOPSY AND LASER LEFT TONGUE LESION;  Surgeon: Melissa Montane, MD;  Location: [facility];  Service: ENT;  Laterality: Left;  . ESOPHAGOGASTRODUODENOSCOPY (EGD) WITH PROPOFOL N/A 09/03/2019     Procedure: ESOPHAGOGASTRODUODENOSCOPY (EGD) WITH PROPOFOL;  Surgeon: Toledo, Benay Pike, MD;  Location: [facility];  Service: Gastroenterology;  Laterality: N/A;  . REDUCTION MAMMAPLASTY    . TONSILLECTOMY    . URETER SURGERY    . VAGINAL HYSTERECTOMY  2000    Prior to Admission medications   Medication Sig Start Date End Date Taking? Authorizing Provider  albuterol (PROVENTIL) (2.5 MG/3ML) 0.083% nebulizer solution Inhale 2.5 mg into the lungs every 4 (four) hours as needed for wheezing or shortness of breath.  06/29/19 06/28/20  [provider]  apixaban (ELIQUIS) 5 MG TABS tablet Take 2 tablets (10 mg total) by mouth 2 (two) times daily. 02/18/20   Georgette Shell, MD  apixaban (ELIQUIS) 5 MG TABS tablet Take 1 tablet (5 mg total) by mouth 2 (two) times daily. 02/24/20   Georgette Shell, MD  ARIPiprazole (ABILIFY) 5 MG tablet Take 5 mg by mouth daily.    [provider]  budesonide (PULMICORT) 0.5 MG/2ML nebulizer solution Take 0.5 mg by nebulization 2 (two) times daily.    [provider]  budesonide-formoterol (SYMBICORT) 160-4.5 MCG/ACT inhaler Inhale 2 puffs into the lungs daily.  08/16/19   [provider]  Cholecalciferol 50 MCG (2000  UT) CAPS Take 2,000 Units by mouth daily.  08/18/19   [provider]  cloNIDine (CATAPRES) 0.1 MG tablet Take 0.1 mg by mouth 2 (two) times daily. 02/12/20   [provider]  doxycycline (VIBRA-TABS) 100 MG tablet Take 100 mg by mouth 2 (two) times daily. 01/29/20   [provider]  ergocalciferol (VITAMIN D2) 1.25 MG (50000 UT) capsule Take 50,000 Units by mouth every Monday.  12/30/18   [provider]  escitalopram (LEXAPRO) 20 MG tablet Take 20 mg by mouth daily.    [provider]  gabapentin (NEURONTIN) 600 MG tablet Take 300 mg by mouth 3 (three) times daily as needed (pain).  01/06/20 01/05/21  [provider]  linaclotide (LINZESS) 72 MCG capsule Take 72  mcg by mouth daily before breakfast.    [provider]  meclizine (ANTIVERT) 25 MG tablet Take 25 mg by mouth 3 (three) times daily as needed for dizziness.    [provider]  methocarbamol (ROBAXIN) 750 MG tablet Take 375-750 mg by mouth 2 (two) times daily as needed for muscle spasms.     [provider]  ondansetron (ZOFRAN) 4 MG tablet Take 1 tablet (4 mg total) by mouth every 6 (six) hours as needed for nausea. 02/18/20   Georgette Shell, MD  ondansetron (ZOFRAN-ODT) 4 MG disintegrating tablet Take 4 mg by mouth every 8 (eight) hours as needed for nausea or vomiting.    [provider]  pantoprazole (PROTONIX) 40 MG tablet Take 40 mg by mouth daily. 08/16/19   [provider]  rosuvastatin (CRESTOR) 10 MG tablet Take 10 mg by mouth daily.    [provider]  SUMAtriptan (IMITREX) 100 MG tablet Take 100 mg by mouth every 2 (two) hours as needed.  03/12/19   [provider]  traMADol (ULTRAM) 50 MG tablet Take 50 mg by mouth every 6 (six) hours as needed for moderate pain.    [provider]  valACYclovir (VALTREX) 500 MG tablet Take 500 mg by mouth 2 (two) times daily.  10/10/15   [provider]  fexofenadine (ALLEGRA) 180 MG tablet Take 180 mg by mouth daily. Patient not taking: Reported on 01/11/2020 07/19/19 01/20/20  [provider]  RABEprazole (ACIPHEX) 20 MG tablet Take 20 mg by mouth daily. 07/19/19 01/20/20  [provider]  ranitidine (ZANTAC) 150 MG tablet Take 150 mg by mouth at bedtime. Patient not taking: Reported on 01/11/2020  01/20/20  [provider]    Allergies Amoxicillin and Augmentin [amoxicillin-pot clavulanate]  Family History  Problem Relation Age of Onset  . Kidney Stones Mother   . Diabetes Mother   . COPD Mother   . Kidney Stones Father   . Migraines Maternal Grandmother   . Stroke Maternal Grandmother   . Diabetes Maternal Grandmother   . Prostate  cancer Maternal Grandfather   . Kidney cancer Neg Hx   . Bladder Cancer Neg Hx     Social History Social History   Tobacco Use  . Smoking status: Passive Smoke Exposure - Never Smoker  . Smokeless tobacco: Never Used  . Tobacco comment: mother smokes outside/ mother deceased  Vaping Use  . Vaping Use: Never used  Substance Use Topics  . Alcohol use: Not Currently    Comment: rarely  . Drug use: No    Review of Systems  Constitutional: No fever/chills Eyes: No visual changes. ENT: No sore throat. Cardiovascular: Denies chest pain. Respiratory: Positive shortness of breath  nonproductive cough. Gastrointestinal: No abdominal pain.  No nausea, no vomiting.  No diarrhea.  No constipation. Genitourinary: Negative for dysuria. Musculoskeletal: Negative for back pain. Skin: Negative for rash. Neurological: Negative for focal weakness or numbness.  Positive for headache, lightheaded dizziness and paresthesias  ____________________________________________   PHYSICAL EXAM:  VITAL SIGNS: Vitals:   05/02/20 2300 05/03/20 0100  BP: (!) 141/86 (!) 148/83  Pulse: 88 87  Resp: 17 17  Temp:    SpO2: 96% 97%     Constitutional: Alert and oriented. Well appearing and in no acute distress.  Obese.  Conversational in full sentences. Eyes: Conjunctivae are normal. PERRL. EOMI. Head: Atraumatic. Nose: No congestion/rhinnorhea. Mouth/Throat: Mucous membranes are dry.  Oropharynx non-erythematous. Neck: No stridor. No cervical spine tenderness to palpation. Cardiovascular: Normal rate, regular rhythm. Grossly normal heart sounds.  Good peripheral circulation. Respiratory: Normal respiratory effort.  No retractions. Lungs CTAB. Gastrointestinal: Soft , nondistended, nontender to palpation. No abdominal bruits. No CVA tenderness. Musculoskeletal: No lower extremity tenderness nor edema.  No joint effusions. No signs of acute trauma. Neurologic:  Normal speech and language. No gross  focal neurologic deficits are appreciated. No gait instability noted. Cranial nerves II through XII intact 5/5 strength and sensation in all 4 extremities Skin:  Skin is warm, dry and intact. No rash noted.  Skin tenting is present on her bilateral hand. Psychiatric: Mood and affect are normal. Speech and behavior are normal.  ____________________________________________   LABS (all labs ordered are listed, but only abnormal results are displayed)  Labs Reviewed  CBC - Abnormal; Notable for the following components:      Result Value   WBC 11.8 (*)    All other components within normal limits  DIFFERENTIAL - Abnormal; Notable for the following components:   Lymphs Abs 4.2 (*)    Monocytes Absolute 1.5 (*)    All other components within normal limits  COMPREHENSIVE METABOLIC PANEL - Abnormal; Notable for the following components:   BUN 22 (*)    Calcium 8.6 (*)    All other components within normal limits  PROTIME-INR  APTT   ____________________________________________  12 Lead EKG  Sinus rhythm, rate of 88 bpm.  Normal axis.  Normal intervals.  No evidence of acute ischemia. ____________________________________________  RADIOLOGY  ED MD interpretation: 2 view CXR reviewed not evidence of acute cardiopulmonary pathology.  CT head reviewed without evidence of acute cranial pathology.  Official radiology report(s): DG Chest 2 View  Result Date: 05/02/2020 CLINICAL DATA:  54 year old female with history of shortness of breath and dizziness. EXAM: CHEST - 2 VIEW COMPARISON:  Chest x-ray 05/08/2019. FINDINGS: Lung volumes are normal. No consolidative airspace disease. No pleural effusions. No pneumothorax. No pulmonary nodule or mass noted. Pulmonary vasculature and the cardiomediastinal silhouette are within normal limits. IMPRESSION: No radiographic evidence of acute cardiopulmonary disease. Electronically Signed   By: Vinnie Langton M.D.   On: 05/02/2020 15:13   CT HEAD  WO CONTRAST  Result Date: 05/02/2020 CLINICAL DATA:  Dizziness, nonspecific; dizziness, difficulty speaking, numbness. Additional history provided: Patient reports high blood pressure, right arm numbness and tingling as well as dizziness since this morning at 9 a.m., recent COVID diagnosis. EXAM: CT HEAD WITHOUT CONTRAST TECHNIQUE: Contiguous axial images were obtained from the base of the skull through the vertex without intravenous contrast. COMPARISON:  Brain MRI 02/18/2020.  Head CT 08/20/2019 FINDINGS: Brain: Cerebral volume is normal for age. 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 There  is no acute intracranial hemorrhage. No acute demarcated cortical infarct is identified. No extra-axial fluid collection. No evidence of intracranial mass. No midline shift. Vascular: No hyperdense vessel. Skull: Normal. Negative for fracture or focal lesion. Sinuses/Orbits: Visualized orbits show no acute finding. No significant paranasal sinus disease or mastoid effusion. IMPRESSION: No CT evidence of acute intracranial abnormality. 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 Electronically Signed   By: Kellie Simmering DO   On: 05/02/2020 15:20    ____________________________________________   PROCEDURES and INTERVENTIONS  Procedure(s) performed (including Critical Care):  .1-3 Lead EKG Interpretation Performed by: Vladimir Crofts, MD Authorized by: Vladimir Crofts, MD     Interpretation: normal     ECG rate:  80   ECG rate assessment: normal     Rhythm: sinus rhythm     Ectopy: none     Conduction: normal      Medications  lactated ringers bolus 1,000 mL (1,000 mLs Intravenous New Bag/Given 05/03/20 0027)  acetaminophen (TYLENOL) tablet 1,000 mg (1,000 mg Oral Given 05/03/20 0029)    ____________________________________________   MDM / ED COURSE  54 year old woman on Eliquis for known PE and  diagnosed with COVID-19 2 weeks ago presents to the ED with episode of presyncopal dizziness this morning, most consistent with mild dehydration, and amenable to continued outpatient management after IV rehydration.  Patient hemodynamically stable without tachycardia or hypoxia.  Exam demonstrates stigmata of dehydration with dry mucous membranes and with skin tenting.  She otherwise looks well without distress or signs of trauma.  Blood work corroborates this with elevated BUN, but otherwise without acute derangements.  EKG is nonischemic.  CXR without superimposed infiltrates to suggest bacterial pneumonia.  CT head without acute intracranial pathology.  Patient reports resolving symptoms after IV fluids and Tylenol administration.  Patient reports baseline shortness of breath, she is understandable, in the setting of her known PE and COVID-19.  She is no evidence of additional acute pathology and no indications for hospitalization.  We discussed return precautions for the ED.  Patient stable for discharge home.  Clinical Course as of May 03 201  Tue May 03, 2020  0152 Reassessed.  Patient reports improving symptoms.  We discussed mild dehydration without evidence of additional acute pathology.  We discussed her expected mild continued shortness of breath in the setting of PE and Covid, with expectation that this should continue to improve.  We discussed return precautions for the ED.   [DS]    Clinical Course User Index [DS] Vladimir Crofts, MD     ____________________________________________   FINAL CLINICAL IMPRESSION(S) / ED DIAGNOSES  Final diagnoses:  Near syncope  Lightheadedness  Dehydration     ED Discharge Orders    None       Taneil Lazarus Tamala Julian   Note:  This document was prepared using Dragon voice recognition software and may include unintentional dictation errors.   Vladimir Crofts, MD 05/03/20 [phone]

## 2020-05-02 NOTE — ED Triage Notes (Signed)
Says sudden onset dizziness and head feels funny at 1140am.  Also has some shortness of breatha nd pain in ribs. r ecent covid.

## 2020-05-03 NOTE — ED Notes (Signed)
Assumed care of pt at 2300. States pounding headache "that feels like I just got beat up." Reports chest palpitations with shortness of breath. States symptoms all started at 1000 on 10/11, previously felt at her baseline. AO x4. Talking in full sentences with regular and unlabored breathing. Side rails up  X2, call bell within reach.

## 2020-05-03 NOTE — Discharge Instructions (Signed)
As we discussed, you have evidence of mild dehydration but otherwise doing well.  You can expect lingering shortness of breath because of your blood clots and COVID-19.  But if you think is significantly worse, please return to the ED.

## 2020-05-03 NOTE — ED Notes (Signed)
DC reviewed by RN and provider, denies needs or concerns. Ambulated out of ED with steady gait. AO x4

## 2020-05-31 ENCOUNTER — Other Ambulatory Visit
Admission: RE | Admit: 2020-05-31 | Discharge: 2020-05-31 | Disposition: A | Discharge status: Home / Self Care | Payer: 59 | Source: Ambulatory Visit | Attending: Cardiology | Admitting: Cardiology

## 2020-05-31 ENCOUNTER — Other Ambulatory Visit: Payer: Self-pay

## 2020-05-31 DIAGNOSIS — Z01812 Encounter for preprocedural laboratory examination: Secondary | ICD-10-CM | POA: Insufficient documentation

## 2020-05-31 DIAGNOSIS — Z20822 Contact with and (suspected) exposure to covid-19: Secondary | ICD-10-CM | POA: Insufficient documentation

## 2020-05-31 LAB — SARS CORONAVIRUS 2 (TAT 6-24 HRS): SARS Coronavirus 2: NEGATIVE

## 2020-06-01 ENCOUNTER — Other Ambulatory Visit: Payer: Self-pay | Admitting: Cardiology

## 2020-06-01 ENCOUNTER — Other Ambulatory Visit: Payer: Self-pay

## 2020-06-01 ENCOUNTER — Ambulatory Visit (INDEPENDENT_AMBULATORY_CARE_PROVIDER_SITE_OTHER): Payer: 59 | Admitting: Surgery

## 2020-06-01 ENCOUNTER — Encounter: Payer: Self-pay | Admitting: Surgery

## 2020-06-01 VITALS — BP 146/88 | HR 91 | Temp 97.7°F | Ht 65.0 in | Wt 266.2 lb

## 2020-06-01 DIAGNOSIS — K449 Diaphragmatic hernia without obstruction or gangrene: Secondary | ICD-10-CM

## 2020-06-01 MED ORDER — SODIUM CHLORIDE 0.9% FLUSH: 3.0000 mL | Freq: Two times a day (BID) | INTRAVENOUS | Status: AC

## 2020-06-01 NOTE — Patient Instructions (Addendum)
Barium Swallow scheduled 06/08/20. Please arrive at 9:30. Nothing to eat/drink 3 hours prior. Please enter through the medical mall of [facility].  We have sent the referral to [facility]. Someone from their office will contact you to schedule an appointment. Try to maintain a healthy weight. We will contact you the end of December to schedule a follow up with Dr.Pabon in January 2022. We will call you with the results of the Barium swallow study.    Hiatal Hernia  A hiatal hernia occurs when part of the stomach slides above the muscle that separates the abdomen from the chest (diaphragm). A person can be born with a hiatal hernia (congenital), or it may develop over time. In almost all cases of hiatal hernia, only the top part of the stomach pushes through the diaphragm. Many people have a hiatal hernia with no symptoms. The larger the hernia, the more likely it is that you will have symptoms. In some cases, a hiatal hernia allows stomach acid to flow back into the tube that carries food from your mouth to your stomach (esophagus). This may cause heartburn symptoms. Severe heartburn symptoms may mean that you have developed a condition called gastroesophageal reflux disease (GERD). What are the causes? This condition is caused by a weakness in the opening (hiatus) where the esophagus passes through the diaphragm to attach to the upper part of the stomach. A person may be born with a weakness in the hiatus, or a weakness can develop over time. What increases the risk? This condition is more likely to develop in:  Older people. Age is a major risk factor for a hiatal hernia, especially if you are over the age of 1.  Pregnant women.  People who are overweight.  People who have frequent constipation. What are the signs or symptoms? Symptoms of this condition usually develop in the form of GERD symptoms. Symptoms include:  Heartburn.  Belching.  Indigestion.  Trouble  swallowing.  Coughing or wheezing.  Sore throat.  Hoarseness.  Chest pain.  Nausea and vomiting. How is this diagnosed? This condition may be diagnosed during testing for GERD. Tests that may be done include:  X-rays of your stomach or chest.  An upper gastrointestinal (GI) series. This is an X-ray exam of your GI tract that is taken after you swallow a chalky liquid that shows up clearly on the X-ray.  Endoscopy. This is a procedure to look into your stomach using a thin, flexible tube that has a tiny camera and light on the end of it. How is this treated? This condition may be treated by:  Dietary and lifestyle changes to help reduce GERD symptoms.  Medicines. These may include: ? Over-the-counter antacids. ? Medicines that make your stomach empty more quickly. ? Medicines that block the production of stomach acid (H2 blockers). ? Stronger medicines to reduce stomach acid (proton pump inhibitors).  Surgery to repair the hernia, if other treatments are not helping. If you have no symptoms, you may not need treatment. Follow these instructions at home: Lifestyle and activity  Do not use any products that contain nicotine or tobacco, such as cigarettes and e-cigarettes. If you need help quitting, ask your health care provider.  Try to achieve and maintain a healthy body weight.  Avoid putting pressure on your abdomen. Anything that puts pressure on your abdomen increases the amount of acid that may be pushed up into your esophagus. ? Avoid bending over, especially after eating. ? Raise the head  of your bed by putting blocks under the legs. This keeps your head and esophagus higher than your stomach. ? Do not wear tight clothing around your chest or stomach. ? Try not to strain when having a bowel movement, when urinating, or when lifting heavy objects. Eating and drinking  Avoid foods that can worsen GERD symptoms. These may include: ? Fatty foods, like fried  foods. ? Citrus fruits, like oranges or lemon. ? Other foods and drinks that contain acid, like orange juice or tomatoes. ? Spicy food. ? Chocolate.  Eat frequent small meals instead of three large meals a day. This helps prevent your stomach from getting too full. ? Eat slowly. ? Do not lie down right after eating. ? Do not eat 1-2 hours before bed.  Do not drink beverages with caffeine. These include cola, coffee, cocoa, and tea.  Do not drink alcohol. General instructions  Take over-the-counter and prescription medicines only as told by your health care provider.  Keep all follow-up visits as told by your health care provider. This is important. Contact a health care provider if:  Your symptoms are not controlled with medicines or lifestyle changes.  You are having trouble swallowing.  You have coughing or wheezing that will not go away. Get help right away if:  Your pain is getting worse.  Your pain spreads to your arms, neck, jaw, teeth, or back.  You have shortness of breath.  You sweat for no reason.  You feel sick to your stomach (nauseous) or you vomit.  You vomit blood.  You have bright red blood in your stools.  You have black, tarry stools. This information is not intended to replace advice given to you by your health care provider. Make sure you discuss any questions you have with your health care provider. Document Revised: 06/21/2017 Document Reviewed: 02/11/2017 Elsevier Patient Education  [place].

## 2020-06-02 ENCOUNTER — Encounter
Admission: RE | Disposition: A | Discharge status: Home / Self Care | Payer: Self-pay | Source: Home / Self Care | Attending: Cardiology

## 2020-06-02 ENCOUNTER — Other Ambulatory Visit: Payer: Self-pay

## 2020-06-02 ENCOUNTER — Telehealth: Payer: Self-pay

## 2020-06-02 ENCOUNTER — Ambulatory Visit
Admission: RE | Admit: 2020-06-02 | Discharge: 2020-06-02 | Disposition: A | Discharge status: Home / Self Care | Payer: 59 | Attending: Cardiology | Admitting: Cardiology

## 2020-06-02 ENCOUNTER — Encounter: Payer: Self-pay | Admitting: Internal Medicine

## 2020-06-02 DIAGNOSIS — Z79899 Other long term (current) drug therapy: Secondary | ICD-10-CM | POA: Diagnosis not present

## 2020-06-02 DIAGNOSIS — E782 Mixed hyperlipidemia: Secondary | ICD-10-CM | POA: Diagnosis not present

## 2020-06-02 DIAGNOSIS — J440 Chronic obstructive pulmonary disease with acute lower respiratory infection: Secondary | ICD-10-CM | POA: Insufficient documentation

## 2020-06-02 DIAGNOSIS — Z6841 Body Mass Index (BMI) 40.0 and over, adult: Secondary | ICD-10-CM | POA: Diagnosis not present

## 2020-06-02 DIAGNOSIS — M79604 Pain in right leg: Secondary | ICD-10-CM | POA: Diagnosis not present

## 2020-06-02 DIAGNOSIS — Z880 Allergy status to penicillin: Secondary | ICD-10-CM | POA: Insufficient documentation

## 2020-06-02 DIAGNOSIS — Z7901 Long term (current) use of anticoagulants: Secondary | ICD-10-CM | POA: Insufficient documentation

## 2020-06-02 DIAGNOSIS — G4733 Obstructive sleep apnea (adult) (pediatric): Secondary | ICD-10-CM | POA: Insufficient documentation

## 2020-06-02 DIAGNOSIS — I272 Pulmonary hypertension, unspecified: Secondary | ICD-10-CM | POA: Diagnosis not present

## 2020-06-02 DIAGNOSIS — I10 Essential (primary) hypertension: Secondary | ICD-10-CM | POA: Diagnosis not present

## 2020-06-02 DIAGNOSIS — R0602 Shortness of breath: Secondary | ICD-10-CM | POA: Diagnosis present

## 2020-06-02 DIAGNOSIS — J209 Acute bronchitis, unspecified: Secondary | ICD-10-CM | POA: Diagnosis not present

## 2020-06-02 HISTORY — PX: RIGHT HEART CATH: CATH118263

## 2020-06-02 SURGERY — RIGHT HEART CATH
Anesthesia: Moderate Sedation | Laterality: Right

## 2020-06-02 MED ORDER — FENTANYL CITRATE (PF) 100 MCG/2ML IJ SOLN
INTRAMUSCULAR | Status: DC | PRN
  Administered 2020-06-02 (×2): 25 ug via INTRAVENOUS

## 2020-06-02 MED ORDER — SODIUM CHLORIDE 0.9% FLUSH: 3.0000 mL | INTRAVENOUS | Status: DC | PRN

## 2020-06-02 MED ORDER — ASPIRIN 81 MG PO CHEW
CHEWABLE_TABLET | ORAL | Status: AC
  Filled 2020-06-02: qty 1

## 2020-06-02 MED ORDER — HEPARIN (PORCINE) IN NACL 1000-0.9 UT/500ML-% IV SOLN
INTRAVENOUS | Status: AC
  Filled 2020-06-02: qty 1000

## 2020-06-02 MED ORDER — ASPIRIN 81 MG PO CHEW: 81.0000 mg | CHEWABLE_TABLET | ORAL | Status: DC

## 2020-06-02 MED ORDER — LIDOCAINE HCL (PF) 1 % IJ SOLN
INTRAMUSCULAR | Status: DC | PRN
  Administered 2020-06-02: 2 mL

## 2020-06-02 MED ORDER — SODIUM CHLORIDE 0.9% FLUSH: 3.0000 mL | Freq: Two times a day (BID) | INTRAVENOUS | Status: DC

## 2020-06-02 MED ORDER — ONDANSETRON HCL 4 MG/2ML IJ SOLN: 4.0000 mg | Freq: Four times a day (QID) | INTRAMUSCULAR | Status: DC | PRN

## 2020-06-02 MED ORDER — SODIUM CHLORIDE 0.9 % IV SOLN: 250.0000 mL | INTRAVENOUS | Status: DC | PRN

## 2020-06-02 MED ORDER — MIDAZOLAM HCL 2 MG/2ML IJ SOLN
INTRAMUSCULAR | Status: AC
  Filled 2020-06-02: qty 2

## 2020-06-02 MED ORDER — SODIUM CHLORIDE 0.9 % IV SOLN: INTRAVENOUS | Status: DC

## 2020-06-02 MED ORDER — FENTANYL CITRATE (PF) 100 MCG/2ML IJ SOLN
INTRAMUSCULAR | Status: AC
  Filled 2020-06-02: qty 2

## 2020-06-02 MED ORDER — LABETALOL HCL 5 MG/ML IV SOLN: 10.0000 mg | INTRAVENOUS | Status: DC | PRN

## 2020-06-02 MED ORDER — HEPARIN (PORCINE) IN NACL 1000-0.9 UT/500ML-% IV SOLN
INTRAVENOUS | Status: DC | PRN
  Administered 2020-06-02: 500 mL

## 2020-06-02 MED ORDER — MIDAZOLAM HCL 2 MG/2ML IJ SOLN
INTRAMUSCULAR | Status: DC | PRN
  Administered 2020-06-02: 1 mg via INTRAVENOUS

## 2020-06-02 MED ORDER — LIDOCAINE HCL (PF) 1 % IJ SOLN
INTRAMUSCULAR | Status: AC
  Filled 2020-06-02: qty 30

## 2020-06-02 MED ORDER — ACETAMINOPHEN 325 MG PO TABS: 650.0000 mg | ORAL_TABLET | ORAL | Status: DC | PRN

## 2020-06-02 MED ORDER — HYDRALAZINE HCL 20 MG/ML IJ SOLN: 10.0000 mg | INTRAMUSCULAR | Status: DC | PRN

## 2020-06-02 SURGICAL SUPPLY — 7 items
CATH BALLN WEDGE 5F 110CM (CATHETERS) ×4 IMPLANT
CATH SWAN GANZ 7F STRAIGHT (CATHETERS) IMPLANT
KIT LHK - MID 3VCOMP MANIF (MISCELLANEOUS) ×4 IMPLANT
NEEDLE PERC 18GX7CM (NEEDLE) IMPLANT
PACK CARDIAC CATH (CUSTOM PROCEDURE TRAY) ×4 IMPLANT
SHEATH AVANTI 7FRX11 (SHEATH) IMPLANT
SHEATH GLIDE SLENDER 4/5FR (SHEATH) ×4 IMPLANT

## 2020-06-02 NOTE — H&P (Signed)
Chief Complaint: Chief Complaint  Patient presents with  . Follow-up  cath per Aleskerov  . Shortness of Breath  numbing sensation on face and arms/hands  Date of Service: 05/24/2020 Date of Birth: 22-Apr-1966 PCP: Mammie Russian, NP  History of Present Illness: Tammy Young is a 54 y.o.female patient with a past medical history significant for COPD, history of a pulmonary embolism, anticoagulated with Eliquis, varicose veins, hypothyroidism, hypertension, obstructive sleep apnea, noncompliant with CPAP, and morbid obesity who presents for a follow-up visit. Due to concerning symptoms of pulmonary hypertension including dyspnea, fatigue, and lightheadedness, it was recommended she follow-up with cardiology for consideration of a right heart catheterization. She presents today and admits to ongoing exertional shortness of breath as well as intermittent episodes of numbness/tingling on her face and both her arms and hands. She was recently started on torsemide 20 mg daily per pulmonology with only mild improvement in lower extremity swelling. She denies chest pain or chest pressure. She denies orthopnea or PND.  I personally reviewed the ECG performed in the office today which revealed normal sinus rhythm at a ventricular rate of 89 bpm with no evidence of ischemia, arrhythmias, or ectopy. Most recent echocardiogram through [facility] on 02/18/2020 revealed normal RV and LV systolic function with an EF estimated between 60 and 65% with no significant valvular abnormalities noted.  Past Medical and Surgical History  Past Medical History Past Medical History:  Diagnosis Date  . Abdominal pain, chronic, epigastric 03/17/2014  . Carpal tunnel syndrome 05/30/2011  . Chronic constipation 05/30/2011  . Depression  . Fatty liver disease, nonalcoholic 08/16/5807  . GERD (gastroesophageal reflux disease) 03/17/2014  . Hemorrhoids  . HLD (hyperlipidemia)  . Kidney stones 05/30/2011  . Multiple gastric  ulcers 04/16/2014  . Myofascial pain on right side 10/08/2012  . Obesity 03/17/2014   Past Surgical History She has a past surgical history that includes Hysterectomy; Tonsillectomy; Carpal tunnel release; Cholecystectomy; Colonoscopy (01/14/2013); egd (01/14/2013); egd (04/05/2014); egd (02/22/2015); Reduction mammaplasty; and egd (09/03/2019).   Medications and Allergies  Current Medications  Current Outpatient Medications  Medication Sig Dispense Refill  . albuterol (PROVENTIL) 2.5 mg /3 mL (0.083 %) nebulizer solution Take 3 mLs (2.5 mg total) by nebulization every 6 (six) hours as needed for Wheezing 75 mL 6  . ARIPiprazole (ABILIFY) 5 MG tablet Take 1 tablet (5 mg total) by mouth once daily for 30 days 30 tablet 0  . budesonide-formoteroL (SYMBICORT) 160-4.5 mcg/actuation inhaler Inhale 2 inhalations into the lungs 2 (two) times daily 6 g 5  . cloNIDine HCL (CATAPRES) 0.1 MG tablet Take 1 tablet (0.1 mg total) by mouth 2 (two) times daily 60 tablet 11  . ELIQUIS 5 mg tablet TAKE 1 TABLET(5 MG) BY MOUTH TWICE DAILY 60 tablet 4  . ergocalciferol, vitamin D2, 1,250 mcg (50,000 unit) capsule Take 1 capsule (50,000 Units total) by mouth once a week 13 capsule 3  . escitalopram oxalate (LEXAPRO) 20 MG tablet TAKE 1 TABLET(20 MG) BY MOUTH EVERY DAY 90 tablet 2  . hydrocodone-chlorpheniramine (TUSSIONEX) 10-8 mg/5 mL ER suspension Take 5 mLs by mouth every 12 (twelve) hours as needed for Cough 300 mL 0  . linaCLOtide (LINZESS) 72 mcg capsule Take 1 capsule (72 mcg total) by mouth once daily 30 capsule 6  . nebulizer accessories Kit USE AS DIRECTED 2 kit 11  . nebulizer and compressor Devi USE AS DIRECTED 1 each 0  . olmesartan-amLODIPine-hydrochlorothiazide (TRIBENZOR) 40-10-25 mg tablet Take 1/2 tablet daily for  6 days, then increase to 1 whole tablet daily. Monitor your BP at home, call the office if BP abnormal or if you have any side effects. 30 tablet 11  . pantoprazole (PROTONIX) 40 MG DR  tablet Take 1 tablet (40 mg total) by mouth once daily Take 15-20 mins before meal 60 tablet 11  . rosuvastatin (CRESTOR) 10 MG tablet Take 1 tablet (10 mg total) by mouth once daily 30 tablet 11  . TORsemide (DEMADEX) 20 MG tablet Take 1 tablet (20 mg total) by mouth once daily 30 tablet 11  . valACYclovir (VALTREX) 500 MG tablet Take 1 tablet (500 mg total) by mouth 2 (two) times daily 6 tablet 11   Current Facility-Administered Medications  Medication Dose Route Frequency Provider Last Rate Last Admin  . cyanocobalamin (VITAMIN B12) injection 1,000 mcg 1,000 mcg Intramuscular Q30 Days Mammie Russian, NP 1,000 mcg at 05/17/20 0200   Allergies: Amoxicillin-pot clavulanate  Social and Family History  Social History reports that she has never smoked. She has never used smokeless tobacco. She reports that she does not drink alcohol and does not use drugs.  Family History Family History  Problem Relation Age of Onset  . High blood pressure (Hypertension) Mother  . Osteoporosis (Thinning of bones) Mother  . Mental illness Mother  . High blood pressure (Hypertension) Father   Review of Systems   Review of Systems: The patient denies chest pain, shortness of breath, orthopnea, paroxysmal nocturnal dyspnea, pedal edema, palpitations, heart racing, fatigue, dizziness, lightheadedness, presyncope, syncope, leg pain, leg cramping. Review of 12 Systems is negative except as described in HPI.   Physical Examination   Vitals:BP 136/82  Pulse 95  Ht 163.8 cm (5' 4.5")  Wt (!) 120.7 kg (266 lb 3.2 oz)  SpO2 97%  BMI 44.99 kg/m  Ht:163.8 cm (5' 4.5") Wt:(!) 120.7 kg (266 lb 3.2 oz) URK:YHCW surface area is 2.34 meters squared. Body mass index is 44.99 kg/m.  General: Well developed, morbidly obese. In no acute distress HEENT: Pupils equally reactive to light and accomodation  Neck: Supple without thyromegaly, or goiter. Carotid pulses 2+. No carotid bruits present.  Pulmonary: Clear  to auscultation bilaterally; no wheezes, rales, rhonchi Cardiovascular: Regular rate and rhythm. No gallops, murmurs or rubs Gastrointestinal: Soft nontender, nondistended, with normal bowel sounds Extremities: No cyanosis, clubbing, or edema Peripheral Pulses: 2+ in upper extremities, 2+ in lower extremities  Neurology: Alert and oriented X3 Pysch: Good affect. Responds appropriately  Assessment and Plan   54 y.o. female with  1. Essential hypertension  -Continue current medication regimen  2. Varicose veins of leg with pain, right  -Compression stockings recommended  3. Obstructive sleep apnea syndrome  -Noncompliant with CPAP; encouraged to reach out to sleep company to discuss alternative mask options  4. COPD with acute bronchitis (CMS-HCC)  -Continue current medications and routine follow-up with pulmonology  5. Mixed hyperlipidemia  -Continue rosuvastatin 10 mg once daily  6. Obesity, Class III, BMI 40-49.9 (morbid obesity) (CMS-HCC)  -Continue to work on weight loss through dietary modifications and physical activity as tolerated by pain  7. SOB (shortness of breath)  -Will assess with [facility] to further guide treatment options     Orders Placed This Encounter  Procedures  . ECG 12-lead   Return after right heart cath.  Teodoro Spray MD  Pt seen and examined no change from above.

## 2020-06-02 NOTE — Progress Notes (Signed)
Aurelio Brash PA for Dr. Ubaldo Glassing at bedside speaking to patient at length regarding procedure, follow up.  Patient remains tearful, reinforced discharge instructions and f/u with Dr. Ubaldo Glassing. Patient denies c/o's , just states "I didn't get answers I wanted"  Offered encouragement

## 2020-06-02 NOTE — Telephone Encounter (Signed)
Referral notes and demographics faxed to Dr.Kadolph's office. Also left message on referral coordinators VM to please call our office once the appointment has been made.

## 2020-06-02 NOTE — Progress Notes (Signed)
Patient ID: Tammy Young, female   DOB: [birth year], 54 y.o.   MRN: [id number]  HPI Tammy Young is a 54 y.o. female seen in consultation at the request of Dr. Glenis Smoker of for hiatal hernia.  She does have significant comorbidities including coronary artery disease, fatty liver, BMI of 44.2, lymphedema, dyspnea on exertion, history of PE currently on Eliquis for anticoagulation. She experiences daily reflux and chronic cough.  Reports that the cough is worse in the morning, she also has chronic hoarseness related to acid reflux.  Currently she does have some intermittent dysphagia for solids.   SHe did have a recent CT of the chest that I have personally reviewed showing evidence of pulmonary emboli, might be a very small sliding hiatal hernia but I am not impressed by that. He has taken PPI without significant improvement of symptoms.  She does have some intermittent chest pains that are retrosternal and has been seen by cardiology and is currently scheduled for a cardiac cath tomorrow CBC and CMP was completely normal. Did have a history of cholecystectomy in the past EGD performed by Dr. Alice Reichert on February 2021.  Please note that I have personally reviewed the images showing evidence of gastritis but no other acute changes.     HPI  Past Medical History:  Diagnosis Date  . Anxiety and depression   . Arthritis   . Collagen vascular disease (HCC)    RA  . Fatty liver disease, nonalcoholic   . GERD (gastroesophageal reflux disease)   . Headache   . Herpes simplex vulvovaginitis   . History of kidney stones   . History of stomach ulcers   . HLD (hyperlipidemia)   . Hypertension   . Hypothyroid   . Hypothyroidism   . Liver disease   . Lymphedema of right lower extremity   . Morbid obesity ([place])   . Multiple gastric ulcers   . PONV (postoperative nausea and vomiting)   . Pre-diabetes   . 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  .  Seizures ([place])    only had with preeclampsia during pregnancy at age 64. none since  . Sleep apnea   . Stress incontinence (female) (female)   . Vitamin B 12 deficiency   . Vitamin D deficiency     Past Surgical History:  Procedure Laterality Date  . BREAST SURGERY     reduction  . CARPAL TUNNEL RELEASE Bilateral   . CHOLECYSTECTOMY    . CYSTOSCOPY WITH URETEROSCOPY, STONE BASKETRY AND STENT PLACEMENT  10/09/2016   Procedure: CYSTOSCOPY WITH URETEROSCOPY, STONE BASKETRY AND STENT PLACEMENT;  Surgeon: Hollice Espy, MD;  Location: [facility];  Service: Urology;;  . DIRECT LARYNGOSCOPY Left 04/15/2015   Procedure: DIRECT LARYNGOSCOPY BIOPSY AND LASER LEFT TONGUE LESION;  Surgeon: Melissa Montane, MD;  Location: [facility];  Service: ENT;  Laterality: Left;  . ESOPHAGOGASTRODUODENOSCOPY (EGD) WITH PROPOFOL N/A 09/03/2019   Procedure: ESOPHAGOGASTRODUODENOSCOPY (EGD) WITH PROPOFOL;  Surgeon: Toledo, Benay Pike, MD;  Location: [facility];  Service: Gastroenterology;  Laterality: N/A;  . REDUCTION MAMMAPLASTY    . RIGHT HEART CATH N/A 06/02/2020   Procedure: RIGHT HEART CATH;  Surgeon: Yolonda Kida, MD;  Location: [facility];  Service: Cardiovascular;  Laterality: N/A;  . TONSILLECTOMY    . URETER SURGERY    . VAGINAL HYSTERECTOMY  2000    Family History  Problem Relation Age of Onset  . Kidney  Stones Mother   . Diabetes Mother   . COPD Mother   . Kidney Stones Father   . Migraines Maternal Grandmother   . Stroke Maternal Grandmother   . Diabetes Maternal Grandmother   . Prostate cancer Maternal Grandfather   . Kidney cancer Neg Hx   . Bladder Cancer Neg Hx     Social History Social History   Tobacco Use  . Smoking status: Passive Smoke Exposure - Never Smoker  . Smokeless tobacco: Never Used  . Tobacco comment: mother smokes outside/ mother deceased  Vaping Use  . Vaping Use: Never used  Substance Use Topics  . Alcohol use: Not Currently     Comment: rarely  . Drug use: No    Allergies  Allergen Reactions  . Amoxicillin Rash  . Augmentin [Amoxicillin-Pot Clavulanate] Itching and Rash    Current Outpatient Medications  Medication Sig Dispense Refill  . albuterol (PROVENTIL) (2.5 MG/3ML) 0.083% nebulizer solution Inhale 2.5 mg into the lungs every 4 (four) hours as needed for wheezing or shortness of breath.     Marland Kitchen apixaban (ELIQUIS) 5 MG TABS tablet Take 1 tablet (5 mg total) by mouth 2 (two) times daily. 60 tablet 1  . ARIPiprazole (ABILIFY) 5 MG tablet Take 5 mg by mouth daily.    . budesonide (PULMICORT) 0.5 MG/2ML nebulizer solution Take 0.5 mg by nebulization 2 (two) times daily.     . budesonide-formoterol (SYMBICORT) 160-4.5 MCG/ACT inhaler Inhale 2 puffs into the lungs in the morning and at bedtime.     . Cholecalciferol 50 MCG (2000 UT) CAPS Take 2,000 Units by mouth daily.     . cyanocobalamin (,VITAMIN B-12,) 1000 MCG/ML injection Inject 1,000 mcg into the muscle every 30 (thirty) days.    Marland Kitchen escitalopram (LEXAPRO) 20 MG tablet Take 20 mg by mouth daily.    Marland Kitchen gabapentin (NEURONTIN) 600 MG tablet Take 300 mg by mouth 3 (three) times daily as needed (pain).     Marland Kitchen linaclotide (LINZESS) 72 MCG capsule Take 72 mcg by mouth daily before breakfast.    . meclizine (ANTIVERT) 25 MG tablet Take 25 mg by mouth 3 (three) times daily as needed for dizziness.     . methocarbamol (ROBAXIN) 750 MG tablet Take 375-750 mg by mouth 2 (two) times daily as needed for muscle spasms.     . Olmesartan-amLODIPine-HCTZ 40-10-25 MG TABS Take 1 tablet by mouth daily.    . ondansetron (ZOFRAN) 4 MG tablet Take 1 tablet (4 mg total) by mouth every 6 (six) hours as needed for nausea. 20 tablet 0  . pantoprazole (PROTONIX) 40 MG tablet Take 40 mg by mouth 2 (two) times daily.     . rosuvastatin (CRESTOR) 10 MG tablet Take 10 mg by mouth daily.    . SUMAtriptan (IMITREX) 100 MG tablet Take 100 mg by mouth every 2 (two) hours as needed for migraine.      . torsemide (DEMADEX) 20 MG tablet Take 20 mg by mouth daily.    . traMADol (ULTRAM) 50 MG tablet Take 50 mg by mouth every 6 (six) hours as needed for moderate pain.     . valACYclovir (VALTREX) 500 MG tablet Take 500 mg by mouth 2 (two) times daily as needed (outbreak).      No current facility-administered medications for this visit.   Facility-Administered Medications Ordered in Other Visits  Medication Dose Route Frequency Provider Last Rate Last Admin  . 0.9 %  sodium chloride infusion  250 mL Intravenous  PRN Teodoro Spray, MD      . 0.9 %  sodium chloride infusion   Intravenous Continuous Teodoro Spray, MD 10 mL/hr at 06/02/20 0732 New Bag at 06/02/20 0732  . 0.9 %  sodium chloride infusion   Intravenous Continuous Teodoro Spray, MD      . 0.9 %  sodium chloride infusion  250 mL Intravenous PRN Teodoro Spray, MD      . acetaminophen (TYLENOL) tablet 650 mg  650 mg Oral Q4H PRN Teodoro Spray, MD      . Derrill Memo ON 06/03/2020] aspirin chewable tablet 81 mg  81 mg Oral Pre-Cath Teodoro Spray, MD      . gadopentetate dimeglumine (MAGNEVIST) injection 20 mL  20 mL Intravenous Once PRN Garvin Fila, MD      . hydrALAZINE (APRESOLINE) injection 10 mg  10 mg Intravenous Q20 Min PRN Teodoro Spray, MD      . labetalol (NORMODYNE) injection 10 mg  10 mg Intravenous Q10 min PRN Teodoro Spray, MD      . ondansetron (ZOFRAN) injection 4 mg  4 mg Intravenous Q6H PRN Teodoro Spray, MD      . sodium chloride flush (NS) 0.9 % injection 3 mL  3 mL Intravenous Q12H Teodoro Spray, MD      . sodium chloride flush (NS) 0.9 % injection 3 mL  3 mL Intravenous PRN Teodoro Spray, MD      . sodium chloride flush (NS) 0.9 % injection 3 mL  3 mL Intravenous Q12H Teodoro Spray, MD      . sodium chloride flush (NS) 0.9 % injection 3 mL  3 mL Intravenous PRN Teodoro Spray, MD         Review of Systems Full ROS  was asked and was negative except for the information on the HPI  Physical  Exam Blood pressure (!) 146/88, pulse 91, temperature 97.7 F (36.5 C), temperature source Oral, height 5\' 5"  (1.651 m), weight 266 lb 3.2 oz (120.7 kg), SpO2 95 %. CONSTITUTIONAL: BBMI 44, NAD EYES: Pupils are equal, round, , Sclera are non-icteric. EARS, NOSE, MOUTH AND THROAT: She is wearing a mask,  Hearing is intact to voice. LYMPH NODES:  Lymph nodes in the neck are normal. RESPIRATORY:  Lungs are clear. There is normal respiratory effort, with equal breath sounds bilaterally, and without pathologic use of accessory muscles. CARDIOVASCULAR: Heart is regular without murmurs, gallops, or rubs. GI: The abdomen is soft, mild tenderness epigastric area, no peritonitis.There are no palpable masses. There is no hepatosplenomegaly. There are normal bowel sounds in all quadrants. GU: Rectal deferred.   MUSCULOSKELETAL: Normal muscle strength and tone. No cyanosis or edema.   SKIN: Turgor is good and there are no pathologic skin lesions or ulcers. NEUROLOGIC: Motor and sensation is grossly normal. Cranial nerves are grossly intact. PSYCH:  Oriented to person, place and time. Affect is normal.  Data Reviewed  I have personally reviewed the patient's imaging, laboratory findings and medical records.    Assessment/Plan 54 year old female with a BMI of 44.2 and significant reflux symptoms and hiatal hernia.  Cussed with the patient in detail about her disease process.  Typically fundoplication and hiatal repair are relatively contraindicated with her high BMI.  Discussed with the patient in detail about options of bariatric surgery versus optimization of weight by medical therapy. She understands this and is very frustrated.  I insisted that this was the best  practice and the right call for her at this time. We will start her work-up with barium swallow to evaluate esophageal function/anatomy. I will refer her to weight management physician and she may need to be referred to bariatric surgery.  Also  she has significant coronary issues that will need to be addressed as well as the potential pulmonary issues. This is a very complex case but repair of hiatal hernia at this time is not in her best interest. Tensive counseling provided.  I will see her back in 2 months I have spent  60 minutes in this encounter with greater than 50% spent in coordination and counseling of her care.  Caroleen Hamman, MD FACS General Surgeon 06/02/2020, 11:45 AM

## 2020-06-02 NOTE — Progress Notes (Signed)
Patient anxious, tearful. Offered encouragement, explanation of procedure and post op care.  Patient did take eliquis this am, Dr. Ubaldo Glassing aware, will use radial approach. Provider aware, patient has been experiencing left sided facial numbness, and left arm numbness.  Neuro intact.

## 2020-06-02 NOTE — Discharge Instructions (Signed)
Right Heart Cath, Care After This sheet gives you information about how to care for yourself after your procedure. Your health care provider may also give you more specific instructions. If you have problems or questions, contact your health care provider. What can I expect after the procedure? After the procedure, it is common to have: Bruising or mild discomfort in the area where the IV was inserted (insertion site). Follow these instructions at home: Eating and drinking  You may eat and drink after your procedure.  Drink a lot of fluids for the first several days after the procedure, as directed by your health care provider. This helps to wash (flush) the contrast out of your body. Examples of healthy fluids include water or low-calorie drinks. General instructions Check your IV insertion area and also your venous access site every day for signs of infection. Check for: Redness, swelling, or pain. Fluid or blood. Warmth. Pus or a bad smell. Take over-the-counter and prescription medicines only as told by your health care provider. Rest and return to your normal activities as told by your health care provider. Ask your health care provider what activities are safe for you. Do not drive for 24 hours if you were given a medicine to help you relax (sedative), or until your health care provider approves. Keep all follow-up visits as told by your health care provider. This is important. Contact a health care provider if: Your skin becomes itchy or you develop a rash or hives. You have a fever that does not get better with medicine. You feel nauseous. You vomit. You have redness, swelling, or pain around the insertion site. You have fluid or blood coming from the insertion site. Your insertion area feels warm to the touch. You have pus or a bad smell coming from the insertion site. Get help right away if: You have difficulty breathing or shortness of breath. You develop chest pain. You  faint. You feel very dizzy. These symptoms may represent a serious problem that is an emergency. Do not wait to see if the symptoms will go away. Get medical help right away. Call your local emergency services (911 in the [place] Do not drive yourself to the hospital. Summary After your procedure, it is common to have bruising or mild discomfort in the area where the IV was inserted. You should check your IV insertion area every day for signs of infection. Take over-the-counter and prescription medicines only as told by your health care provider. You should drink a lot of fluids for the first several days after the procedure to help flush the contrast from your body. This information is not intended to replace advice given to you by your health care provider. Make sure you discuss any questions you have with your health care provider. Document Released: 04/29/2013 Document Revised: 06/21/2017 Document Reviewed: 06/02/2016 Elsevier Patient Education  2020 Elsevier Inc. 

## 2020-06-05 ENCOUNTER — Encounter (INDEPENDENT_AMBULATORY_CARE_PROVIDER_SITE_OTHER): Payer: Self-pay

## 2020-06-06 ENCOUNTER — Encounter: Payer: Self-pay | Admitting: Internal Medicine

## 2020-06-07 ENCOUNTER — Other Ambulatory Visit: Payer: Self-pay | Admitting: Cardiology

## 2020-06-07 DIAGNOSIS — R079 Chest pain, unspecified: Secondary | ICD-10-CM

## 2020-06-08 ENCOUNTER — Other Ambulatory Visit (HOSPITAL_COMMUNITY): Payer: Self-pay | Admitting: Cardiology

## 2020-06-08 ENCOUNTER — Telehealth: Payer: Self-pay

## 2020-06-08 ENCOUNTER — Ambulatory Visit
Admission: RE | Admit: 2020-06-08 | Discharge: 2020-06-08 | Disposition: A | Discharge status: Home / Self Care | Payer: 59 | Source: Ambulatory Visit | Attending: Surgery | Admitting: Surgery

## 2020-06-08 ENCOUNTER — Other Ambulatory Visit: Payer: Self-pay

## 2020-06-08 DIAGNOSIS — K449 Diaphragmatic hernia without obstruction or gangrene: Secondary | ICD-10-CM | POA: Insufficient documentation

## 2020-06-08 DIAGNOSIS — R079 Chest pain, unspecified: Secondary | ICD-10-CM

## 2020-06-08 NOTE — Telephone Encounter (Signed)
-----   Message from Jules Husbands, MD sent at 06/08/2020 11:09 AM EST ----- Please let her know that study shows a hiatal hernia but no surprises ----- Message ----- From: Interface, Rad Results In Sent: 06/08/2020  10:53 AM EST To: Jules Husbands, MD

## 2020-06-08 NOTE — Telephone Encounter (Signed)
Spoke with Hope from [facility] office- they have reached out to the patient letting her know they can make an appointment for January but patient has not responded. They are not accepting any new patient until the first of the year, however they have placed the patient on a wait list .   I also left message for patient to return call to our office to let her know DR.Kadolph's office has been trying to reach her.

## 2020-06-08 NOTE — Telephone Encounter (Signed)
Spoke with patient and made aware of recent imaging per Dr Dahlia Byes and patient has questions that she would like to discuss with Dr Dahlia Byes such as he mentioned patient would have to lose weight prior to proceeding with surgery, patient states she would like to come in for an appointment to discuss in detail what the next recommended steps would be and also patient is still experiencing pain. Patient is scheduled for an appointment on 06/13/20 at 3:00pm with Dr.Pabon. Patient was made aware to arrive 15 minutes earlier than scheduled appointment in case she has paperwork to complete. Patient verbalized understanding and has no further questions.

## 2020-06-13 ENCOUNTER — Ambulatory Visit: Payer: Self-pay | Admitting: Surgery

## 2020-06-15 ENCOUNTER — Telehealth (HOSPITAL_COMMUNITY): Payer: Self-pay | Admitting: Emergency Medicine

## 2020-06-15 NOTE — Telephone Encounter (Signed)
Reaching out to patient to offer assistance regarding upcoming cardiac imaging study; pt verbalizes understanding of appt date/time, parking situation and where to check in, pre-test NPO status and medications ordered, and verified current allergies; name and call back number provided for further questions should they arise Marchia Bond RN Navigator Cardiac Imaging [place] [facility] [phone] office [phone] cell  Called pharm to ensure there was one time dose of metoprolol tartrate to take prior to scan. They stated it was ready for pickup.  Called patient again to let her know rx was ready for pickup and she is to take it 2 hr prior to scan. Pt verbalized understanding Clarise Cruz

## 2020-06-20 ENCOUNTER — Ambulatory Visit (INDEPENDENT_AMBULATORY_CARE_PROVIDER_SITE_OTHER): Payer: 59 | Admitting: Surgery

## 2020-06-20 ENCOUNTER — Ambulatory Visit (HOSPITAL_COMMUNITY): Payer: 59

## 2020-06-20 ENCOUNTER — Encounter: Payer: Self-pay | Admitting: Surgery

## 2020-06-20 ENCOUNTER — Other Ambulatory Visit: Payer: Self-pay

## 2020-06-20 VITALS — BP 136/81 | HR 94 | Temp 98.5°F | Ht 64.0 in | Wt 262.4 lb

## 2020-06-20 DIAGNOSIS — E663 Overweight: Secondary | ICD-10-CM | POA: Diagnosis not present

## 2020-06-20 NOTE — Patient Instructions (Addendum)
We are putting a referral to see a Gastric bypass specialist Dr. Redmond Pulling at [facility]. Expect a phone call from [facility] with information on how to move forward.

## 2020-06-22 NOTE — Progress Notes (Signed)
Outpatient Surgical Follow Up  06/22/2020  Tammy Young is an 54 y.o. female.   Chief Complaint  Patient presents with  . Follow-up    discuss hiatal hernia repair surgery    HPI: Tammy Young is a 54 year old female known to me with a history of hiatal hernia and significant reflux.  Other significant comorbidities to include coronary artery disease, lymphedema and history of PE currently anticoagulated.  She continues to have recalcitrant reflux symptoms.  Last time I saw her her BMI was 44 and now her current BMI is 45.  Have discussed with her about the importance of optimizing her weight.  He did have barium swallow showing a small hiatal hernia and reflux.  No evidence of other intraluminal lesions.  Past Medical History:  Diagnosis Date  . Anxiety and depression   . Arthritis   . Collagen vascular disease (HCC)    RA  . Fatty liver disease, nonalcoholic   . GERD (gastroesophageal reflux disease)   . Headache   . Herpes simplex vulvovaginitis   . History of kidney stones   . History of stomach ulcers   . HLD (hyperlipidemia)   . Hypertension   . Hypothyroid   . Hypothyroidism   . Liver disease   . Lymphedema of right lower extremity   . Morbid obesity ([place])   . Multiple gastric ulcers   . PONV (postoperative nausea and vomiting)   . Pre-diabetes   . 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  . Seizures ([place])    only had with preeclampsia during pregnancy at age 48. none since  . Sleep apnea   . Stress incontinence (female) (female)   . Vitamin B 12 deficiency   . Vitamin D deficiency     Past Surgical History:  Procedure Laterality Date  . BREAST SURGERY     reduction  . CARPAL TUNNEL RELEASE Bilateral   . CHOLECYSTECTOMY    . CYSTOSCOPY WITH URETEROSCOPY, STONE BASKETRY AND STENT PLACEMENT  10/09/2016   Procedure: CYSTOSCOPY WITH URETEROSCOPY, STONE BASKETRY AND STENT PLACEMENT;  Surgeon: Hollice Espy, MD;  Location:  [facility];  Service: Urology;;  . DIRECT LARYNGOSCOPY Left 04/15/2015   Procedure: DIRECT LARYNGOSCOPY BIOPSY AND LASER LEFT TONGUE LESION;  Surgeon: Melissa Montane, MD;  Location: [facility];  Service: ENT;  Laterality: Left;  . ESOPHAGOGASTRODUODENOSCOPY (EGD) WITH PROPOFOL N/A 09/03/2019   Procedure: ESOPHAGOGASTRODUODENOSCOPY (EGD) WITH PROPOFOL;  Surgeon: Toledo, Tammy Pike, MD;  Location: [facility] ENDOSCOPY;  Service: Gastroenterology;  Laterality: N/A;  . REDUCTION MAMMAPLASTY    . RIGHT HEART CATH N/A 06/02/2020   Procedure: RIGHT HEART CATH;  Surgeon: Yolonda Kida, MD;  Location: [facility];  Service: Cardiovascular;  Laterality: N/A;  . TONSILLECTOMY    . URETER SURGERY    . VAGINAL HYSTERECTOMY  2000    Family History  Problem Relation Age of Onset  . Kidney Stones Mother   . Diabetes Mother   . COPD Mother   . Kidney Stones Father   . Migraines Maternal Grandmother   . Stroke Maternal Grandmother   . Diabetes Maternal Grandmother   . Prostate cancer Maternal Grandfather   . Kidney cancer Neg Hx   . Bladder Cancer Neg Hx     Social History:  reports that she is a non-smoker but has been exposed to tobacco smoke. She has never used smokeless tobacco. She reports previous alcohol use. She reports that she  does not use drugs.  Allergies:  Allergies  Allergen Reactions  . Amoxicillin Rash  . Augmentin [Amoxicillin-Pot Clavulanate] Itching and Rash    Medications reviewed.  ROS Full ROS performed and is otherwise negative other than what is stated in HPI   BP 136/81   Pulse 94   Temp 98.5 F (36.9 C) (Oral)   Ht 5\' 4"  (1.626 m)   Wt 262 lb 6.4 oz (119 kg)   SpO2 95%   BMI 45.04 kg/m   Physical Exam Vitals and nursing note reviewed. Exam conducted with a chaperone present.  Constitutional:      General: She is not in acute distress.    Appearance: She is obese.  Cardiovascular:     Rate and Rhythm: Normal rate and regular rhythm.      Heart sounds: No murmur heard.   Pulmonary:     Effort: Pulmonary effort is normal. No respiratory distress.     Breath sounds: Normal breath sounds.  Abdominal:     General: Abdomen is flat. There is no distension.     Palpations: Abdomen is soft. There is no mass.     Tenderness: There is no abdominal tenderness. There is no rebound.     Hernia: No hernia is present.  Musculoskeletal:     Cervical back: Normal range of motion and neck supple. No rigidity or tenderness.  Skin:    General: Skin is warm and dry.     Capillary Refill: Capillary refill takes less than 2 seconds.  Neurological:     General: No focal deficit present.     Mental Status: She is alert and oriented to person, place, and time.  Psychiatric:        Mood and Affect: Mood normal.        Behavior: Behavior normal.        Thought Content: Thought content normal.        Judgment: Judgment normal.    Assessment/Plan: 54 year old female with recalcitrant reflux and hiatal hernia. Unfortunately she continues to have significant symptoms of reflux.  She has not been able to lose weight.  Discussed with patient in detail about other alternatives to include gastric bypass.   I Do think that gastric bypass will be beneficial for care.  We will make appropriate referral to bariatric surgery ( Dr. Greer Pickerel).  Again reiterated that to repair the hiatal hernia and do fundoplication her weight needs to be optimized.  I do not think that she will be able to achieve these on her own without any surgical adjuvant interventions.  Greater than 50% of the 40 minutes  visit was spent in counseling/coordination of care   Caroleen Hamman, MD [facility]

## 2020-06-23 ENCOUNTER — Ambulatory Visit: Payer: 59

## 2020-06-24 ENCOUNTER — Encounter (HOSPITAL_COMMUNITY): Payer: Self-pay

## 2020-06-24 ENCOUNTER — Ambulatory Visit (HOSPITAL_COMMUNITY): Payer: 59

## 2020-07-01 ENCOUNTER — Telehealth: Payer: Self-pay

## 2020-07-01 NOTE — Telephone Encounter (Signed)
Spoke with Lilia Pro at [facility] to check on referral for pt that was placed on 06/20/20. She stated she will get with bariatric referral coordinator to check on this. Pt was advised at her visit on 11/29 to complete the necessary steps in order to get a consultation with Dr. Redmond Pulling at [facility].

## 2020-07-04 ENCOUNTER — Telehealth: Payer: Self-pay

## 2020-07-04 NOTE — Telephone Encounter (Signed)
Spoke with April from [facility] to check on referral. April states that the pt has completed the seminar class and now they are just waiting on the paperwork from pt, then they can get her scheduled.

## 2020-10-10 ENCOUNTER — Other Ambulatory Visit: Payer: Self-pay | Admitting: Surgery

## 2020-10-10 ENCOUNTER — Other Ambulatory Visit (HOSPITAL_COMMUNITY): Payer: Self-pay | Admitting: Surgery

## 2020-10-10 ENCOUNTER — Other Ambulatory Visit: Payer: Self-pay | Admitting: Pulmonary Disease

## 2020-10-10 DIAGNOSIS — I2699 Other pulmonary embolism without acute cor pulmonale: Secondary | ICD-10-CM

## 2020-10-10 DIAGNOSIS — M7989 Other specified soft tissue disorders: Secondary | ICD-10-CM

## 2020-10-11 ENCOUNTER — Ambulatory Visit (HOSPITAL_COMMUNITY)
Admission: RE | Admit: 2020-10-11 | Discharge: 2020-10-11 | Disposition: A | Discharge status: Home / Self Care | Payer: 59 | Source: Ambulatory Visit | Attending: Cardiovascular Disease | Admitting: Cardiovascular Disease

## 2020-10-11 ENCOUNTER — Other Ambulatory Visit: Payer: Self-pay

## 2020-10-11 DIAGNOSIS — M7989 Other specified soft tissue disorders: Secondary | ICD-10-CM | POA: Diagnosis not present

## 2020-10-12 ENCOUNTER — Ambulatory Visit
Admission: RE | Admit: 2020-10-12 | Discharge: 2020-10-12 | Disposition: A | Discharge status: Home / Self Care | Payer: 59 | Source: Ambulatory Visit | Attending: Surgery | Admitting: Surgery

## 2020-10-12 ENCOUNTER — Ambulatory Visit
Admission: RE | Admit: 2020-10-12 | Discharge: 2020-10-12 | Disposition: A | Discharge status: Home / Self Care | Payer: 59 | Source: Ambulatory Visit | Attending: Pulmonary Disease | Admitting: Pulmonary Disease

## 2020-10-12 DIAGNOSIS — I2699 Other pulmonary embolism without acute cor pulmonale: Secondary | ICD-10-CM | POA: Diagnosis not present

## 2020-10-12 LAB — POCT I-STAT CREATININE: Creatinine, Ser: 0.8 mg/dL (ref 0.44–1.00)

## 2020-10-12 MED ORDER — IOHEXOL 350 MG/ML SOLN
100.0000 mL | Freq: Once | INTRAVENOUS | Status: AC | PRN
  Administered 2020-10-12: 100 mL via INTRAVENOUS

## 2020-11-23 ENCOUNTER — Encounter: Payer: 59 | Attending: Surgery | Admitting: Skilled Nursing Facility1

## 2020-11-23 ENCOUNTER — Other Ambulatory Visit: Payer: Self-pay

## 2020-11-23 ENCOUNTER — Encounter: Payer: Self-pay | Admitting: Skilled Nursing Facility1

## 2020-11-23 DIAGNOSIS — N183 Chronic kidney disease, stage 3 unspecified: Secondary | ICD-10-CM | POA: Insufficient documentation

## 2020-11-23 NOTE — Progress Notes (Addendum)
Nutrition Assessment for Bariatric Surgery Medical Nutrition Therapy Appt Start Time: 2:00 End Time: 3:00  Patient was seen on 11/23/2020 for Pre-Operative Nutrition Assessment. Letter of approval faxed to [facility] bariatric surgery program coordinator on 11/23/2020.   Referral stated Supervised Weight Loss (SWL) visits needed: 0  Not cleared at this time:  Pt to follow up for minimum of one more visit to assist pt with progressing through stages of change/further nutrition education. RD advised pt that this follow up visit is not mandated through insurance. Pt verbalized agreement.  Planned surgery: RYGB Pt expectation of surgery: to lose weight Pt expectation of dietitian: to help    NUTRITION ASSESSMENT   Anthropometrics  Start weight at [facility]: 272 lbs (date: 11/23/2020)  Height: 64 in BMI: 46.69 kg/m2     Clinical  Medical hx: Anxiety, arthritis, depression, GERD, sleep apnea, hyperlipidemia, HTN, kidney disease, nonalcoholic fatty liver, seizures, thyroid disease, prediabetes, gastric ulcers, collagen vascular disease, kidney stone, vitamin D, stress incontinence, pulmonary blood clots  Medications: see list Labs: RBC 3.69, HCT 33.9, Potassium 3.3, AST 81, creatinine 1.02, Glucose 222 (states she was on a steroid and in the H), B12 250 Notable signs/symptoms: pain Any previous deficiencies? vitamin D, Vitamin B12  Micronutrient Nutrition Focused Physical Exam: Hair: No issues observed Eyes: No issues observed Mouth: No issues observed Neck: No issues observed Nails: No issues observed Skin: No issues observed  Lifestyle & Dietary Hx  Pt states she was crying in her surgeons appt stating she was worried she would not be approved for surgery because of that ulcer history: pt also states back when she had those ulcers in her 20's she took a lot of BC powder. Pt states she has been taking some form of antacid on and off since 1997. Pt states she does not go for  her b12 injections stating it is too difficult to make the appts: Dietitian educated the pt on the importance of getting those injections  Pt states she is upset with her constantly being diagnosed with something new.  Pt states he wants to stay in the bed and sleep all day and does when she is not at work.  Pt states she travels for her job: always going back to her home to sleep.   Pt states back in her 20's she did try ex lax for weight loss stating she has no desire to attempt weight loss with that strategy again.   24-Hr Dietary Recall First Meal: coffee + fried egg or caramel frappe from [employer] Snack:  Second Meal: eaten out or rice and chicken Snack:  Third Meal: eaten out Snack: ice cream Beverages: water, sweet tea, coffee   Estimated Energy Needs Calories: 1500   NUTRITION DIAGNOSIS  Overweight/obesity ([place]-3.3) related to past poor dietary habits and physical inactivity as evidenced by patient w/ planned RYGB surgery following dietary guidelines for continued weight loss.    NUTRITION INTERVENTION  Nutrition counseling (C-1) and education (E-2) to facilitate bariatric surgery goals.   Pre-Op Goals Reviewed with the Patient . Track food and beverage intake (pen and paper, MyFitness Pal, Baritastic app, etc.) . Make healthy food choices while monitoring portion sizes: make your meals from home, avoid eating out . Consume 3 meals per day or try to eat every 3-5 hours . Avoid concentrated sugars and fried foods: avoid caramel frappes . Keep sugar & fat in the single digits per serving on food labels . Practice CHEWING your food (aim for applesauce consistency) .  Practice not drinking 15 minutes before, during, and 30 minutes after each meal and snack . Avoid all carbonated beverages (ex: soda, sparkling beverages)  . Limit caffeinated beverages (ex: coffee, tea, energy drinks) . Avoid all sugar-sweetened beverages (ex: regular soda, sports drinks)  . Avoid alcohol   . Aim for 64-100 ounces of FLUID daily (with at least half of fluid intake being plain water)  . Aim for at least 60-80 grams of PROTEIN daily . Look for a liquid protein source that contains ?15 g protein and ?5 g carbohydrate (ex: shakes, drinks, shots) . Make a list of non-food related activities . Physical activity is an important part of a healthy lifestyle so keep it moving! The goal is to reach 150 minutes of exercise per week, including cardiovascular and weight baring activity. . Make an appt to get a b12 injection  *Goals that are bolded indicate the pt would like to start working towards these  Handouts Provided Include  . Bariatric Surgery handouts (Nutrition Visits, Pre-Op Goals, Protein Shakes, Vitamins & Minerals) . Detailed MyPlate  Learning Style & Readiness for Change Teaching method utilized: Visual & Auditory  Demonstrated degree of understanding via: Teach Back  Readiness Level: Pre contemplative  Barriers to learning/adherence to lifestyle change: unknown at this time  RD's Notes for Next Visit . Assess pts adherence to chosen goals     MONITORING & EVALUATION Dietary intake, weekly physical activity, body weight, and pre-op goals reached at next nutrition visit.    Next Steps  Patient is to follow up at [facility] for Pre-Op Class >2 weeks before surgery for further nutrition education.  Next visit to assist in effecting change

## 2020-12-15 ENCOUNTER — Encounter: Payer: 59 | Admitting: Skilled Nursing Facility1

## 2020-12-15 ENCOUNTER — Other Ambulatory Visit: Payer: Self-pay

## 2020-12-15 DIAGNOSIS — E66813 Obesity, class 3: Secondary | ICD-10-CM

## 2020-12-15 DIAGNOSIS — N183 Chronic kidney disease, stage 3 unspecified: Secondary | ICD-10-CM | POA: Diagnosis not present

## 2020-12-15 NOTE — Progress Notes (Signed)
Supervised Weight Loss Visit Bariatric Nutrition Education   Planned Surgery: RYGB  NUTRITION ASSESSMENT  Pt completed visits.   Pt has cleared nutrition requirements.     Anthropometrics  Start weight at [facility]: 272 lbs (date: 11/23/2020)  Weight: 270 lbs Height: 64 in BMI: 46.38 kg/m2     Clinical  Medical hx: Anxiety, arthritis, depression, GERD, sleep apnea, hyperlipidemia, HTN, kidney disease, nonalcoholic fatty liver, seizures, thyroid disease, prediabetes, gastric ulcers, collagen vascular disease, kidney stone, vitamin D, stress incontinence, pulmonary blood clots  Medications: see list Labs: RBC 3.69, HCT 33.9, Potassium 3.3, AST 81, creatinine 1.02, Glucose 222 (states she was on a steroid and in the H), B12 250 Notable signs/symptoms: pain Any previous deficiencies? vitamin D, Vitamin B12  Lifestyle & Dietary Hx  Pt states since her last appt she has been eating salads , only had 1 frappe, and eating long grain rice and only drank 3 sweet teas. Pt states she has had her grandkids so has not had the best eating habits. Pt state she watchers her grandson every other weekend. Pt state she always tries to eat before 7pm.  Pt states she has open heart surgery next week (according to EMR Cardiopulmonary bypass) .  Pt states it is too expensive to eat healthy. Pt states she has not added any salt to any of her foods. Pt states he has had more energy and able to walk longer since making these dietary changes.   Estimated daily fluid intake: unknown oz Supplements:  Current average weekly physical activity: ADL's  24-Hr Dietary Recall First Meal: 2 boiled eggs or yogurt + strawberries + granola Snack: fruit Second Meal: [place]: grilled chicken salad or slim fast chake Snack: meat and cheese  Third Meal: long grain rice + canned chicken or tuna  Snack:  Beverages: juice, water, sweet tea  Estimated Energy Needs Calories: 1600   NUTRITION DIAGNOSIS   Overweight/obesity ([place]-3.3) related to past poor dietary habits and physical inactivity as evidenced by patient w/ planned RYGB surgery following dietary guidelines for continued weight loss.   NUTRITION INTERVENTION  Nutrition counseling (C-1) and education (E-2) to facilitate bariatric surgery goals.  Pre-Op Goals Progress & New Goals  . Continue: Make healthy food choices while monitoring portion sizes: make your meals from home, avoid eating out . Continue: Avoid concentrated sugars and fried foods: avoid caramel frappes . Continue:  Make an appt to get a b12 injection . NEW: reduce your juice   Learning Style & Readiness for Change Teaching method utilized: Visual & Auditory  Demonstrated degree of understanding via: Teach Back  Readiness Level: Action Barriers to learning/adherence to lifestyle change: changes are hard   MONITORING & EVALUATION Dietary intake, weekly physical activity, body weight, and pre-op goals in 1 month.   Next Steps  Patient is to return to [facility] for pre-op class Pt has completed visits. No further supervised visits required/recomended

## 2020-12-21 DIAGNOSIS — Z8616 Personal history of COVID-19: Secondary | ICD-10-CM

## 2020-12-21 HISTORY — DX: Personal history of COVID-19: Z86.16

## 2021-01-13 ENCOUNTER — Ambulatory Visit (INDEPENDENT_AMBULATORY_CARE_PROVIDER_SITE_OTHER): Payer: 59 | Admitting: Psychology

## 2021-01-26 ENCOUNTER — Ambulatory Visit (INDEPENDENT_AMBULATORY_CARE_PROVIDER_SITE_OTHER): Payer: 59 | Admitting: Psychology

## 2021-01-26 DIAGNOSIS — F509 Eating disorder, unspecified: Secondary | ICD-10-CM

## 2021-01-30 ENCOUNTER — Telehealth: Payer: Self-pay | Admitting: Skilled Nursing Facility1

## 2021-01-30 NOTE — Telephone Encounter (Signed)
Was going to call pt at the direction of LCSW to update pt on post-op diet but pt has not had pre-op class yet, will delay this call.

## 2021-02-03 ENCOUNTER — Ambulatory Visit
Admission: RE | Admit: 2021-02-03 | Discharge: 2021-02-03 | Disposition: A | Discharge status: Home / Self Care | Payer: 59 | Attending: Gastroenterology | Admitting: Gastroenterology

## 2021-02-03 ENCOUNTER — Encounter: Payer: Self-pay | Admitting: *Deleted

## 2021-02-03 ENCOUNTER — Ambulatory Visit: Payer: 59 | Admitting: Anesthesiology

## 2021-02-03 ENCOUNTER — Encounter
Admission: RE | Disposition: A | Discharge status: Home / Self Care | Payer: Self-pay | Source: Home / Self Care | Attending: Gastroenterology

## 2021-02-03 DIAGNOSIS — Z7901 Long term (current) use of anticoagulants: Secondary | ICD-10-CM | POA: Insufficient documentation

## 2021-02-03 DIAGNOSIS — Z79899 Other long term (current) drug therapy: Secondary | ICD-10-CM | POA: Insufficient documentation

## 2021-02-03 DIAGNOSIS — Z7951 Long term (current) use of inhaled steroids: Secondary | ICD-10-CM | POA: Diagnosis not present

## 2021-02-03 DIAGNOSIS — I509 Heart failure, unspecified: Secondary | ICD-10-CM | POA: Diagnosis not present

## 2021-02-03 DIAGNOSIS — Z8616 Personal history of COVID-19: Secondary | ICD-10-CM | POA: Diagnosis not present

## 2021-02-03 DIAGNOSIS — K2950 Unspecified chronic gastritis without bleeding: Secondary | ICD-10-CM | POA: Diagnosis not present

## 2021-02-03 DIAGNOSIS — I130 Hypertensive heart and chronic kidney disease with heart failure and stage 1 through stage 4 chronic kidney disease, or unspecified chronic kidney disease: Secondary | ICD-10-CM | POA: Insufficient documentation

## 2021-02-03 DIAGNOSIS — Z6841 Body Mass Index (BMI) 40.0 and over, adult: Secondary | ICD-10-CM | POA: Insufficient documentation

## 2021-02-03 DIAGNOSIS — Z880 Allergy status to penicillin: Secondary | ICD-10-CM | POA: Diagnosis not present

## 2021-02-03 DIAGNOSIS — R1013 Epigastric pain: Secondary | ICD-10-CM | POA: Diagnosis present

## 2021-02-03 DIAGNOSIS — Z881 Allergy status to other antibiotic agents status: Secondary | ICD-10-CM | POA: Diagnosis not present

## 2021-02-03 DIAGNOSIS — Z8711 Personal history of peptic ulcer disease: Secondary | ICD-10-CM | POA: Diagnosis not present

## 2021-02-03 DIAGNOSIS — N183 Chronic kidney disease, stage 3 unspecified: Secondary | ICD-10-CM | POA: Insufficient documentation

## 2021-02-03 DIAGNOSIS — Z800 Family history of malignant neoplasm of digestive organs: Secondary | ICD-10-CM | POA: Diagnosis not present

## 2021-02-03 DIAGNOSIS — Z86711 Personal history of pulmonary embolism: Secondary | ICD-10-CM | POA: Insufficient documentation

## 2021-02-03 HISTORY — DX: Other constipation: K59.09

## 2021-02-03 HISTORY — DX: Diaphragmatic hernia without obstruction or gangrene: K44.9

## 2021-02-03 HISTORY — DX: Heart failure, unspecified: I50.9

## 2021-02-03 HISTORY — DX: Chronic kidney disease, unspecified: N18.9

## 2021-02-03 HISTORY — DX: Chronic thromboembolic pulmonary hypertension: I27.24

## 2021-02-03 HISTORY — DX: Chronic cough: R05.3

## 2021-02-03 HISTORY — DX: Unspecified hemorrhoids: K64.9

## 2021-02-03 HISTORY — DX: Personal history of COVID-19: Z86.16

## 2021-02-03 HISTORY — PX: ESOPHAGOGASTRODUODENOSCOPY (EGD) WITH PROPOFOL: SHX5813

## 2021-02-03 SURGERY — ESOPHAGOGASTRODUODENOSCOPY (EGD) WITH PROPOFOL
Anesthesia: General

## 2021-02-03 MED ORDER — PROPOFOL 10 MG/ML IV BOLUS: INTRAVENOUS | Status: DC | PRN

## 2021-02-03 MED ORDER — PROPOFOL 10 MG/ML IV BOLUS
INTRAVENOUS | Status: DC | PRN
  Administered 2021-02-03: 100 mg via INTRAVENOUS

## 2021-02-03 MED ORDER — LIDOCAINE HCL (CARDIAC) PF 100 MG/5ML IV SOSY
PREFILLED_SYRINGE | INTRAVENOUS | Status: DC | PRN
  Administered 2021-02-03: 50 mg via INTRAVENOUS

## 2021-02-03 MED ORDER — PROPOFOL 500 MG/50ML IV EMUL
INTRAVENOUS | Status: AC
  Filled 2021-02-03: qty 50

## 2021-02-03 MED ORDER — PROPOFOL 500 MG/50ML IV EMUL
INTRAVENOUS | Status: DC | PRN
  Administered 2021-02-03: 1 ug/kg/min via INTRAVENOUS
  Administered 2021-02-03: 120 ug/kg/min via INTRAVENOUS

## 2021-02-03 MED ORDER — SODIUM CHLORIDE 0.9 % IV SOLN: INTRAVENOUS | Status: DC

## 2021-02-03 NOTE — H&P (Signed)
Outpatient short stay form Pre-procedure 02/03/2021 7:44 AM Tammy Miyamoto MD, MPH  Primary Physician: NP Payton Mccallum  Reason for visit:  Pre-bariatric eval  History of present illness:   55 y/o lady with obesity, hypertension, and history of Pe's not on anticoagulation here for EGD for prebariatric eval and epigastric pain. Grandfather with history of Pancreatic cancer. No significant new symptoms. History of PUD.    Current Facility-Administered Medications:    0.9 %  sodium chloride infusion, , Intravenous, Continuous, Naser Schuld, Hilton Cork, MD, Last Rate: 20 mL/hr at 02/03/21 0718, New Bag at 02/03/21 0718  Facility-Administered Medications Ordered in Other Encounters:    gadopentetate dimeglumine (MAGNEVIST) injection 20 mL, 20 mL, Intravenous, Once PRN, Leonie Man, Lucy Antigua, MD   sodium chloride flush (NS) 0.9 % injection 3 mL, 3 mL, Intravenous, Q12H, Fath, Javier Docker, MD  Medications Prior to Admission  Medication Sig Dispense Refill Last Dose   ondansetron (ZOFRAN) 4 MG tablet Take 1 tablet (4 mg total) by mouth every 6 (six) hours as needed for nausea. 20 tablet 0 Past Month   torsemide (DEMADEX) 20 MG tablet Take 20 mg by mouth daily.   Past Week   valACYclovir (VALTREX) 500 MG tablet Take 500 mg by mouth 2 (two) times daily as needed (outbreak).    Past Month   albuterol (PROVENTIL) (2.5 MG/3ML) 0.083% nebulizer solution Inhale 2.5 mg into the lungs every 4 (four) hours as needed for wheezing or shortness of breath.       apixaban (ELIQUIS) 5 MG TABS tablet Take 1 tablet (5 mg total) by mouth 2 (two) times daily. 60 tablet 1 01/09/2021   ARIPiprazole (ABILIFY) 5 MG tablet Take 5 mg by mouth daily.   01/31/2021   budesonide (PULMICORT) 0.5 MG/2ML nebulizer solution Take 0.5 mg by nebulization 2 (two) times daily.  (Patient not taking: Reported on 02/03/2021)   Not Taking   budesonide-formoterol (SYMBICORT) 160-4.5 MCG/ACT inhaler Inhale 2 puffs into the lungs in the morning and at bedtime.     01/31/2021   Cholecalciferol 50 MCG (2000 UT) CAPS Take 2,000 Units by mouth daily.  (Patient not taking: Reported on 02/03/2021)   Not Taking   cyanocobalamin (,VITAMIN B-12,) 1000 MCG/ML injection Inject 1,000 mcg into the muscle every 30 (thirty) days.   10/06/2020   escitalopram (LEXAPRO) 20 MG tablet Take 20 mg by mouth daily.   01/31/2021   gabapentin (NEURONTIN) 600 MG tablet Take 300 mg by mouth 3 (three) times daily as needed (pain).       linaclotide (LINZESS) 72 MCG capsule Take 72 mcg by mouth daily before breakfast.   01/31/2021   meclizine (ANTIVERT) 25 MG tablet Take 25 mg by mouth 3 (three) times daily as needed for dizziness.  (Patient not taking: Reported on 02/03/2021)   Not Taking   meloxicam (MOBIC) 7.5 MG tablet Take 7.5 mg by mouth daily. (Patient not taking: Reported on 02/03/2021)   Not Taking   methocarbamol (ROBAXIN) 750 MG tablet Take 375-750 mg by mouth 2 (two) times daily as needed for muscle spasms.  (Patient not taking: Reported on 02/03/2021)   Not Taking   Olmesartan-amLODIPine-HCTZ 40-10-25 MG TABS Take 1 tablet by mouth daily.   01/31/2021   pantoprazole (PROTONIX) 40 MG tablet Take 40 mg by mouth 2 (two) times daily.    01/31/2021   rosuvastatin (CRESTOR) 10 MG tablet Take 10 mg by mouth daily. (Patient not taking: Reported on 02/03/2021)   Not Taking   SUMAtriptan (IMITREX) 100  MG tablet Take 100 mg by mouth every 2 (two) hours as needed for migraine.  (Patient not taking: Reported on 02/03/2021)   Not Taking   traMADol (ULTRAM) 50 MG tablet Take 50 mg by mouth every 6 (six) hours as needed for moderate pain.  (Patient not taking: Reported on 02/03/2021)   Not Taking     Allergies  Allergen Reactions   Amoxicillin Rash   Augmentin [Amoxicillin-Pot Clavulanate] Itching and Rash     Past Medical History:  Diagnosis Date   Anxiety and depression    Arthritis    CHF (congestive heart failure) (HCC)    Chronic constipation    Chronic cough    Chronic kidney disease  (CKD)    stage 3   Collagen vascular disease (HCC)    RA   CTEPH (chronic thromboembolic pulmonary hypertension) (HCC)    Fatty liver disease, nonalcoholic    GERD (gastroesophageal reflux disease)    Headache    Hemorrhoids    Herpes simplex vulvovaginitis    Hiatal hernia    History of kidney stones    History of stomach ulcers    HLD (hyperlipidemia)    Hypertension    Hypothyroid    Hypothyroidism    Liver disease    Lymphedema of right lower extremity    Morbid obesity (HCC)    Multiple gastric ulcers    Multiple gastric ulcers    Personal history of COVID-19    PONV (postoperative nausea and vomiting)    Pre-diabetes    Renal disorder    kidney stones and shortened ureter repaired with surgery as a child.   Seizures ([place])    only had with preeclampsia during pregnancy at age 53. none since   Sleep apnea    Stress incontinence (female) (female)    Vitamin B 12 deficiency    Vitamin D deficiency     Review of systems:  Otherwise negative.    Physical Exam  Gen: Alert, oriented. Appears stated age.  HEENT: PERRLA. Lungs: No respiratory distress CV: RRR Abd: soft, benign, no masses Ext: No edema    Planned procedures: Proceed with EGD. The patient understands the nature of the planned procedure, indications, risks, alternatives and potential complications including but not limited to bleeding, infection, perforation, damage to internal organs and possible oversedation/side effects from anesthesia. The patient agrees and gives consent to proceed.  Please refer to procedure notes for findings, recommendations and patient disposition/instructions.     Tammy Miyamoto MD, MPH Gastroenterology 02/03/2021  7:44 AM

## 2021-02-03 NOTE — Interval H&P Note (Signed)
History and Physical Interval Note:  02/03/2021 7:47 AM  Tammy Young  has presented today for surgery, with the diagnosis of PRE-OP EXAM EPIGASTIC PAIN.  The various methods of treatment have been discussed with the patient and family. After consideration of risks, benefits and other options for treatment, the patient has consented to  Procedure(s) with comments: ESOPHAGOGASTRODUODENOSCOPY (EGD) WITH PROPOFOL (N/A) - ELIQUIS as a surgical intervention.  The patient's history has been reviewed, patient examined, no change in status, stable for surgery.  I have reviewed the patient's chart and labs.  Questions were answered to the patient's satisfaction.     Lesly Rubenstein  Ok to proceed with EGD

## 2021-02-03 NOTE — Op Note (Signed)
[facility] Gastroenterology Patient Name: Tammy Young Procedure Date: 02/03/2021 7:47 AM MRN: [id number] Account #: [id number] Date of Birth: 11-14-1965 Admit Type: Outpatient Age: 55 Room: [facility] ENDO ROOM 3 Gender: Female Note Status: Finalized Procedure:             Upper GI endoscopy Indications:           Epigastric abdominal pain Providers:             Andrey Farmer MD, MD Referring MD:          No Local Md, MD (Referring MD) Medicines:             Monitored Anesthesia Care Complications:         No immediate complications. Estimated blood loss:                         Minimal. Procedure:             Pre-Anesthesia Assessment:                        - Prior to the procedure, a History and Physical was                         performed, and patient medications and allergies were                         reviewed. The patient is competent. The risks and                         benefits of the procedure and the sedation options and                         risks were discussed with the patient. All questions                         were answered and informed consent was obtained.                         Patient identification and proposed procedure were                         verified by the physician, the nurse, the anesthetist                         and the technician in the endoscopy suite. Mental                         Status Examination: alert and oriented. Airway                         Examination: normal oropharyngeal airway and neck                         mobility. Respiratory Examination: clear to                         auscultation. CV Examination: normal. Prophylactic  Antibiotics: The patient does not require prophylactic                         antibiotics. Prior Anticoagulants: The patient has                         taken no previous anticoagulant or antiplatelet                         agents. ASA Grade  Assessment: III - A patient with                         severe systemic disease. After reviewing the risks and                         benefits, the patient was deemed in satisfactory                         condition to undergo the procedure. The anesthesia                         plan was to use monitored anesthesia care (MAC).                         Immediately prior to administration of medications,                         the patient was re-assessed for adequacy to receive                         sedatives. The heart rate, respiratory rate, oxygen                         saturations, blood pressure, adequacy of pulmonary                         ventilation, and response to care were monitored                         throughout the procedure. The physical status of the                         patient was re-assessed after the procedure.                        After obtaining informed consent, the endoscope was                         passed under direct vision. Throughout the procedure,                         the patient's blood pressure, pulse, and oxygen                         saturations were monitored continuously. The Endoscope                         was introduced through the mouth, and advanced to the  second part of duodenum. The upper GI endoscopy was                         accomplished without difficulty. The patient tolerated                         the procedure well. Findings:      The examined esophagus was normal.      There is no endoscopic evidence of hiatal hernia in the lower third of       the esophagus. She could have sliding hiatal hernia not appreciable on       this exam.      The entire examined stomach was normal. Biopsies were taken with a cold       forceps for Helicobacter pylori testing. Estimated blood loss was       minimal.      The examined duodenum was normal. Impression:            - Normal esophagus.                         - Normal stomach. Biopsied.                        - Normal examined duodenum. Recommendation:        - Discharge patient to home.                        - Resume previous diet.                        - Continue present medications.                        - Await pathology results.                        - Return to referring physician as previously                         scheduled. Procedure Code(s):     --- Professional ---                        [phone], Esophagogastroduodenoscopy, flexible,                         transoral; with biopsy, single or multiple Diagnosis Code(s):     --- Professional ---                        R10.13, Epigastric pain CPT copyright 2019 American Medical Association. All rights reserved. The codes documented in this report are preliminary and upon coder review may  be revised to meet current compliance requirements. Andrey Farmer MD, MD 02/03/2021 8:05:51 AM Number of Addenda: 0 Note Initiated On: 02/03/2021 7:47 AM Estimated Blood Loss:  Estimated blood loss was minimal.      [facility]

## 2021-02-03 NOTE — Addendum Note (Signed)
Addendum  created 02/03/21 1211 by Babs Sciara, CRNA   Charge Capture section accepted

## 2021-02-03 NOTE — Anesthesia Preprocedure Evaluation (Signed)
Anesthesia Evaluation  Patient identified by MRN, date of birth, ID band Patient awake    Reviewed: Allergy & Precautions, H&P , NPO status , Patient's Chart, lab work & pertinent test results, reviewed documented beta blocker date and time   History of Anesthesia Complications (+) PONV and history of anesthetic complications  Airway Mallampati: II  TM Distance: <3 FB Neck ROM: full    Dental  (+) Poor Dentition, Chipped, Missing   Pulmonary sleep apnea , COPD,    Pulmonary exam normal        Cardiovascular Exercise Tolerance: Poor hypertension, On Medications +CHF  Normal cardiovascular exam     Neuro/Psych  Headaches, Seizures -,  PSYCHIATRIC DISORDERS Anxiety Depression  Neuromuscular disease negative neurological ROS  negative psych ROS   GI/Hepatic Neg liver ROS, hiatal hernia, PUD, GERD  Medicated,  Endo/Other  negative endocrine ROSHypothyroidism   Renal/GU Renal diseasenegative Renal ROS  negative genitourinary   Musculoskeletal  (+) Arthritis ,   Abdominal   Peds  Hematology negative hematology ROS (+)   Anesthesia Other Findings Past Medical History: No date: Anxiety and depression No date: Arthritis No date: Fatty liver disease, nonalcoholic No date: GERD (gastroesophageal reflux disease) No date: Headache No date: Herpes simplex vulvovaginitis No date: History of kidney stones No date: History of stomach ulcers No date: HLD (hyperlipidemia) No date: Hypertension No date: Hypothyroid No date: Hypothyroidism No date: Liver disease No date: Lymphedema of right lower extremity No date: Morbid obesity ([place]) No date: Multiple gastric ulcers No date: PONV (postoperative nausea and vomiting) No date: Pre-diabetes No date: Renal disorder     Comment:  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 No date: Seizures (HCC)     Comment:  only had with preeclampsia  during pregnancy at age 55.               none since No date: Sleep apnea No date: Stress incontinence (female) (female) No date: Vitamin B 12 deficiency No date: Vitamin D deficiency Past Surgical History: No date: BREAST SURGERY No date: CARPAL TUNNEL RELEASE; Bilateral No date: CHOLECYSTECTOMY 10/09/2016: CYSTOSCOPY WITH URETEROSCOPY, STONE BASKETRY AND STENT  PLACEMENT     Comment:  Procedure: CYSTOSCOPY WITH URETEROSCOPY, STONE BASKETRY               AND STENT PLACEMENT;  Surgeon: Hollice Espy, MD;                Location: [facility] ORS;  Service: Urology;; 04/15/2015: DIRECT LARYNGOSCOPY; Left     Comment:  Procedure: DIRECT LARYNGOSCOPY BIOPSY AND LASER LEFT               TONGUE LESION;  Surgeon: Melissa Montane, MD;  Location: [place];  Service: ENT;  Laterality: Left; No date: TONSILLECTOMY No date: URETER SURGERY 2000: VAGINAL HYSTERECTOMY   Reproductive/Obstetrics negative OB ROS                             Anesthesia Physical  Anesthesia Plan  ASA: III  Anesthesia Plan: General   Post-op Pain Management:    Induction: Intravenous  PONV Risk Score and Plan: TIVA and Propofol infusion  Airway Management Planned: Natural Airway and Nasal Cannula  Additional Equipment:   Intra-op Plan:  Post-operative Plan:   Informed Consent: I have reviewed the patients History and Physical, chart, labs and discussed the procedure including the risks, benefits and alternatives for the proposed anesthesia with the patient or authorized representative who has indicated his/her understanding and acceptance.     Dental Advisory Given  Plan Discussed with: CRNA  Anesthesia Plan Comments: (Patient consented for risks of anesthesia including but not limited to:  - adverse reactions to medications - risk of airway placement if required - damage to eyes, teeth, lips or other oral mucosa - nerve damage due to positioning  - sore  throat or hoarseness - Damage to heart, brain, nerves, lungs, other parts of body or loss of life  Patient voiced understanding.)        Anesthesia Quick Evaluation

## 2021-02-03 NOTE — Transfer of Care (Signed)
Immediate Anesthesia Transfer of Care Note  Patient: MIKHAELA ZAUGG  Procedure(s) Performed: ESOPHAGOGASTRODUODENOSCOPY (EGD) WITH PROPOFOL  Patient Location: PACU and Endoscopy Unit  Anesthesia Type:MAC  Level of Consciousness: awake, alert  and oriented  Airway & Oxygen Therapy: Patient Spontanous Breathing  Post-op Assessment: Report given to RN and Post -op Vital signs reviewed and stable  Post vital signs: Reviewed and stable  Last Vitals:  Vitals Value Taken Time  BP 121/70 02/03/21 0830  Temp    Pulse 76 02/03/21 0832  Resp 13 02/03/21 0832  SpO2 99 % 02/03/21 0832  Vitals shown include unvalidated device data.  Last Pain:  Vitals:   02/03/21 0810  TempSrc: Temporal  PainSc:          Complications: No notable events documented.

## 2021-02-03 NOTE — Anesthesia Postprocedure Evaluation (Signed)
Anesthesia Post Note  Patient: Tammy Young  Procedure(s) Performed: ESOPHAGOGASTRODUODENOSCOPY (EGD) WITH PROPOFOL  Patient location during evaluation: Endoscopy Anesthesia Type: General Level of consciousness: awake and alert Pain management: pain level controlled Vital Signs Assessment: post-procedure vital signs reviewed and stable Respiratory status: spontaneous breathing, nonlabored ventilation, respiratory function stable and patient connected to nasal cannula oxygen Cardiovascular status: blood pressure returned to baseline and stable Postop Assessment: no apparent nausea or vomiting Anesthetic complications: no   No notable events documented.   Last Vitals:  Vitals:   02/03/21 0820 02/03/21 0830  BP: 126/87 121/70  Pulse: 78 76  Resp: 12 11  Temp:    SpO2: 97% 99%    Last Pain:  Vitals:   02/03/21 0810  TempSrc: Temporal  PainSc:                  Precious Haws Jadon Harbaugh

## 2021-02-06 ENCOUNTER — Encounter: Payer: Self-pay | Admitting: Gastroenterology

## 2021-02-06 LAB — SURGICAL PATHOLOGY

## 2021-03-06 ENCOUNTER — Other Ambulatory Visit: Payer: Self-pay

## 2021-03-06 ENCOUNTER — Ambulatory Visit
Admission: EM | Admit: 2021-03-06 | Discharge: 2021-03-06 | Disposition: A | Discharge status: Home / Self Care | Payer: 59 | Attending: Physician Assistant | Admitting: Physician Assistant

## 2021-03-06 DIAGNOSIS — L299 Pruritus, unspecified: Secondary | ICD-10-CM | POA: Diagnosis not present

## 2021-03-06 DIAGNOSIS — W57XXXA Bitten or stung by nonvenomous insect and other nonvenomous arthropods, initial encounter: Secondary | ICD-10-CM | POA: Diagnosis not present

## 2021-03-06 MED ORDER — TRIAMCINOLONE ACETONIDE 0.5 % EX OINT: 1.0000 "application " | TOPICAL_OINTMENT | Freq: Two times a day (BID) | CUTANEOUS | 0 refills | Status: AC

## 2021-03-06 MED ORDER — HYDROXYZINE HCL 25 MG PO TABS: 25.0000 mg | ORAL_TABLET | Freq: Four times a day (QID) | ORAL | 0 refills | Status: AC | PRN

## 2021-03-06 MED ORDER — METHYLPREDNISOLONE 4 MG PO TBPK: ORAL_TABLET | ORAL | 0 refills | Status: DC

## 2021-03-06 NOTE — ED Triage Notes (Signed)
Pt here with complaint of bug bites up and down legs both. Went fishing Friday and noticed them Saturday, has tried home remedies with no relief.

## 2021-03-06 NOTE — Discharge Instructions (Addendum)
I have sent corticosteroid pills to help with swelling and the rash.  Have also sent a topical to help with this to.  Additionally I sent hydroxyzine to help with itching.  As we discussed if you continue to have new bumps pop up there is a possibility he could have bedbugs or something in your household that is continue to bite or sting you.  In this case you would need to contact an exterminator.

## 2021-03-06 NOTE — ED Provider Notes (Signed)
[facility]    CSN: [id number] Arrival date & time: 03/06/21  1337      History   Chief Complaint No chief complaint on file.   HPI Tammy Young is a 55 y.o. female presenting for 2-day history of red and itchy bumps of the lower legs and forearms.  Patient denies any associated pain.  She says that she went fishing the day before onset of these itchy bumps.  Patient believes she may have been bit or stung by some sort of insect.  She does state that she noticed a couple new bumps today.  Patient denies anyone in the household having similar symptoms.  She denies ever seeing any insects.  Patient says she is tried over-the-counter Benadryl and use hydrocortisone cream without any improvement in symptoms and is here today to "get a shot for itching."  No other complaints.  HPI  Past Medical History:  Diagnosis Date   Anxiety and depression    Arthritis    CHF (congestive heart failure) (HCC)    Chronic constipation    Chronic cough    Chronic kidney disease (CKD)    stage 3   Collagen vascular disease (HCC)    RA   CTEPH (chronic thromboembolic pulmonary hypertension) (HCC)    Fatty liver disease, nonalcoholic    GERD (gastroesophageal reflux disease)    Headache    Hemorrhoids    Herpes simplex vulvovaginitis    Hiatal hernia    History of kidney stones    History of stomach ulcers    HLD (hyperlipidemia)    Hypertension    Hypothyroid    Hypothyroidism    Liver disease    Lymphedema of right lower extremity    Morbid obesity (HCC)    Multiple gastric ulcers    Multiple gastric ulcers    Personal history of COVID-19    PONV (postoperative nausea and vomiting)    Pre-diabetes    Renal disorder    kidney stones and shortened ureter repaired with surgery as a child.   Seizures ([place])    only had with preeclampsia during pregnancy at age 42. 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 12/15/2019   Chronic pain syndrome 10/22/2019   Chronic bilateral low back pain without sciatica 10/22/2019   SI (sacroiliac) joint inflammation ([place]) 10/22/2019   Chronic SI joint pain 10/22/2019   Insect bite of left elbow 10/06/2019   Pruritic rash 09/11/2019   Osteopenia of multiple sites 08/31/2019   Swelling of limb 08/28/2019   Pain in limb 08/28/2019   Varicose veins of leg with pain, right 08/20/2019   Stroke-like symptoms 08/20/2019   Bilateral hip pain 08/20/2019   Prediabetes 08/18/2019   Vitamin B12 deficiency 08/18/2019   Vitamin D deficiency 08/18/2019   Lymphedema of right lower extremity 08/14/2019   Stress incontinence in female 08/14/2019   Recurrent major depressive disorder, in partial remission ([place]) 01/14/2018   Obstructive sleep apnea syndrome 10/18/2017   Sialadenitis 10/17/2017   SOB (shortness of breath) 09/15/2017   Chronic cough 08/01/2017   Obstructive uropathy 10/08/2016   Ureterolithiasis    Acute right flank pain    Intractable pain  Chronic renal disease, stage 3, moderately decreased glomerular filtration rate between 30-59 mL/min/1.73 square meter ([place]) 07/02/2016   Essential hypertension 06/22/2016   Intractable chronic migraine without aura and with status migrainosus 12/02/2015   Chronic daily headache 11/16/2015   Tension headache 11/16/2015   Chronic pansinusitis 10/24/2015   Chronic tension-type headache, intractable 10/24/2015   Hemorrhoids 10/24/2015   Hyperlipidemia, unspecified 10/24/2015   Herpesviral vulvovaginitis 11/17/2014   Multiple gastric ulcers 04/16/2014   Obesity, Class III, BMI 40-49.9 (morbid obesity) ([place]) 03/17/2014   Epigastric pain 03/17/2014   Fatty (change of) liver, not elsewhere classified 03/17/2014    Gastroesophageal reflux disease 03/17/2014   Hypothyroidism, unspecified 10/23/2012   Thoracic back pain 10/23/2012   Myalgia, other site 10/08/2012   Calculus of kidney 05/30/2011   Carpal tunnel syndrome 05/30/2011    Past Surgical History:  Procedure Laterality Date   BREAST SURGERY     reduction   CARPAL TUNNEL RELEASE Bilateral    CHOLECYSTECTOMY     CYSTOSCOPY WITH URETEROSCOPY, STONE BASKETRY AND STENT PLACEMENT  10/09/2016   Procedure: CYSTOSCOPY WITH URETEROSCOPY, STONE BASKETRY AND STENT PLACEMENT;  Surgeon: Hollice Espy, MD;  Location: [facility];  Service: Urology;;   DIRECT LARYNGOSCOPY Left 04/15/2015   Procedure: DIRECT LARYNGOSCOPY BIOPSY AND LASER LEFT TONGUE LESION;  Surgeon: Melissa Montane, MD;  Location: [place];  Service: ENT;  Laterality: Left;   ESOPHAGOGASTRODUODENOSCOPY (EGD) WITH PROPOFOL N/A 09/03/2019   Procedure: ESOPHAGOGASTRODUODENOSCOPY (EGD) WITH PROPOFOL;  Surgeon: Toledo, Benay Pike, MD;  Location: [facility];  Service: Gastroenterology;  Laterality: N/A;   ESOPHAGOGASTRODUODENOSCOPY (EGD) WITH PROPOFOL N/A 02/03/2021   Procedure: ESOPHAGOGASTRODUODENOSCOPY (EGD) WITH PROPOFOL;  Surgeon: Lesly Rubenstein, MD;  Location: [facility];  Service: Endoscopy;  Laterality: N/A;  ELIQUIS   LEFT HEART CATH     REDUCTION MAMMAPLASTY     RIGHT HEART CATH N/A 06/02/2020   Procedure: RIGHT HEART CATH;  Surgeon: Yolonda Kida, MD;  Location: [facility];  Service: Cardiovascular;  Laterality: N/A;   TONSILLECTOMY     URETER SURGERY     VAGINAL HYSTERECTOMY  2000    OB History   No obstetric history on file.      Home Medications    Prior to Admission medications   Medication Sig Start Date End Date Taking? Authorizing Provider  apixaban (ELIQUIS) 5 MG TABS tablet Take 1 tablet (5 mg total) by mouth 2 (two) times daily. 02/24/20  Yes Georgette Shell, MD  escitalopram (LEXAPRO) 20 MG tablet Take 20 mg by mouth daily.    Yes [provider]  hydrOXYzine (ATARAX/VISTARIL) 25 MG tablet Take 1 tablet (25 mg total) by mouth every 6 (six) hours as needed for up to 7 days for itching. 03/06/21 03/13/21 Yes Danton Clap, PA-C  linaclotide (LINZESS) 72 MCG capsule Take 72 mcg by mouth daily before breakfast.   Yes [provider]  meloxicam (MOBIC) 7.5 MG tablet Take 7.5 mg by mouth daily.   Yes [provider]  methylPREDNISolone (MEDROL DOSEPAK) 4 MG TBPK tablet Take 6 tabs PO day 1 and decrease by 1 tab daily until complete 03/06/21  Yes Laurene Footman B, PA-C  Olmesartan-amLODIPine-HCTZ 40-10-25 MG TABS Take 1 tablet by mouth daily. 05/07/20  Yes [provider]  pantoprazole (PROTONIX) 40 MG tablet Take 40 mg by mouth 2 (two) times daily.  08/16/19  Yes [provider]  rosuvastatin (CRESTOR) 10 MG tablet Take 10 mg by mouth daily.  Yes [provider]  torsemide (DEMADEX) 20 MG tablet Take 20 mg by mouth daily. 05/17/20  Yes [provider]  triamcinolone ointment (KENALOG) 0.5 % Apply 1 application topically 2 (two) times daily for 10 days. 03/06/21 03/16/21 Yes Laurene Footman B, PA-C  albuterol (PROVENTIL) (2.5 MG/3ML) 0.083% nebulizer solution Inhale 2.5 mg into the lungs every 4 (four) hours as needed for wheezing or shortness of breath.  06/29/19 06/28/20  [provider]  ARIPiprazole (ABILIFY) 5 MG tablet Take 5 mg by mouth daily.    [provider]  budesonide (PULMICORT) 0.5 MG/2ML nebulizer solution Take 0.5 mg by nebulization 2 (two) times daily.  Patient not taking: No sig reported    [provider]  budesonide-formoterol (SYMBICORT) 160-4.5 MCG/ACT inhaler Inhale 2 puffs into the lungs in the morning and at bedtime.  08/16/19   [provider]  Cholecalciferol 50 MCG (2000 UT) CAPS Take 2,000 Units by mouth daily.  Patient not taking: No sig reported 08/18/19   [provider]  cyanocobalamin (,VITAMIN  B-12,) 1000 MCG/ML injection Inject 1,000 mcg into the muscle every 30 (thirty) days.    [provider]  gabapentin (NEURONTIN) 600 MG tablet Take 300 mg by mouth 3 (three) times daily as needed (pain).  01/06/20 01/05/21  [provider]  meclizine (ANTIVERT) 25 MG tablet Take 25 mg by mouth 3 (three) times daily as needed for dizziness.  Patient not taking: Reported on 02/03/2021    [provider]  methocarbamol (ROBAXIN) 750 MG tablet Take 375-750 mg by mouth 2 (two) times daily as needed for muscle spasms.  Patient not taking: Reported on 02/03/2021    [provider]  ondansetron (ZOFRAN) 4 MG tablet Take 1 tablet (4 mg total) by mouth every 6 (six) hours as needed for nausea. 02/18/20   Georgette Shell, MD  SUMAtriptan (IMITREX) 100 MG tablet Take 100 mg by mouth every 2 (two) hours as needed for migraine.  Patient not taking: Reported on 02/03/2021 03/12/19   [provider]  traMADol (ULTRAM) 50 MG tablet Take 50 mg by mouth every 6 (six) hours as needed for moderate pain.  Patient not taking: Reported on 02/03/2021    [provider]  valACYclovir (VALTREX) 500 MG tablet Take 500 mg by mouth 2 (two) times daily as needed (outbreak).  10/10/15   [provider]  fexofenadine (ALLEGRA) 180 MG tablet Take 180 mg by mouth daily. Patient not taking: Reported on 01/11/2020 07/19/19 01/20/20  [provider]  RABEprazole (ACIPHEX) 20 MG tablet Take 20 mg by mouth daily. 07/19/19 01/20/20  [provider]  ranitidine (ZANTAC) 150 MG tablet Take 150 mg by mouth at bedtime. Patient not taking: Reported on 01/11/2020  01/20/20  [provider]    Family History Family History  Problem Relation Age of Onset   Kidney Stones Mother    Diabetes Mother    COPD Mother    Kidney Stones Father    Migraines Maternal Grandmother    Stroke Maternal Grandmother    Diabetes Maternal Grandmother    Prostate cancer  Maternal Grandfather    Kidney cancer Neg Hx    Bladder Cancer Neg Hx     Social History Social History   Tobacco Use   Smoking status: Never    Passive exposure: Yes   Smokeless tobacco: Never   Tobacco comments:    mother smokes outside/ mother deceased  Vaping Use   Vaping Use: Never used  Substance Use Topics   Alcohol use: Not Currently    Comment: rarely   Drug use: No     Allergies   Amoxicillin and Augmentin [amoxicillin-pot clavulanate]   Review of Systems Review of Systems  Constitutional:  Negative for fatigue and fever.  Musculoskeletal:  Negative for arthralgias, joint swelling and myalgias.  Skin:  Positive for rash.  Neurological:  Negative for weakness and numbness.    Physical Exam Triage Vital Signs ED Triage Vitals  Enc Vitals Group     BP 03/06/21 1426 136/86     Pulse Rate 03/06/21 1426 87     Resp 03/06/21 1426 16     Temp 03/06/21 1426 98.1 F (36.7 C)     Temp Source 03/06/21 1426 Oral     SpO2 03/06/21 1426 100 %     Weight 03/06/21 1424 260 lb (117.9 kg)     Height 03/06/21 1424 '5\' 4"'$  (1.626 m)     Head Circumference --      Peak Flow --      Pain Score 03/06/21 1423 5     Pain Loc --      Pain Edu? --      Excl. in [place]? --    No data found.  Updated Vital Signs BP 136/86 (BP Location: Left Arm)   Pulse 87   Temp 98.1 F (36.7 C) (Oral)   Resp 16   Ht '5\' 4"'$  (1.626 m)   Wt 260 lb (117.9 kg)   SpO2 100%   BMI 44.63 kg/m   Physical Exam Vitals and nursing note reviewed.  Constitutional:      General: She is not in acute distress.    Appearance: Normal appearance. She is obese. She is not ill-appearing or toxic-appearing.  HENT:     Head: Normocephalic and atraumatic.  Eyes:     General: No scleral icterus.       Right eye: No discharge.        Left eye: No discharge.     Conjunctiva/sclera: Conjunctivae normal.  Cardiovascular:     Rate and Rhythm: Normal rate and regular rhythm.     Heart sounds: Normal heart  sounds.  Pulmonary:     Effort: Pulmonary effort is normal. No respiratory distress.     Breath sounds: Normal breath sounds.  Musculoskeletal:     Cervical back: Neck supple.  Skin:    General: Skin is dry.     Findings: Rash (Numerous erythematous large papules of bilateral lower extremities and upper extremities (legs>arms)) present.  Neurological:     General: No focal deficit present.     Mental Status: She is alert. Mental status is at baseline.     Motor: No weakness.     Gait: Gait normal.  Psychiatric:        Mood and Affect: Mood normal.        Behavior: Behavior normal.        Thought Content: Thought content normal.     UC Treatments / Results  Labs (all labs ordered are listed, but only abnormal results are displayed) Labs Reviewed - No data to display  EKG   Radiology No results found.  Procedures Procedures (including critical care time)  Medications Ordered in UC Medications - No data to display  Initial Impression / Assessment and Plan / UC Course  I have reviewed the triage vital signs and the nursing notes.  Pertinent labs & imaging results that were available during my care  of the patient were reviewed by me and considered in my medical decision making (see chart for details).  55 year old female presenting for itchy red bumps of bilateral lower legs and forearms.  Patient says that she noticed this rash after she got home from fishing.  Patient believes she may have been bit or stung by something when she was fishing.  History of multiple over-the-counter products without relief.  Patient does request a injection for itching.  I did advise her that since she drove herself we cannot give her an antihistamine medication but I can certainly prescribe hydroxyzine for her which I have done.  I question possible bedbugs given the appearance of the rash but she states she is sure she does not have bedbugs or anything else in the household that could have  caused this rash.  We will treat patient at this time with Medrol and triamcinolone ointment.  Advised her that if she continues to get new areas of rash she may need to consider having an exterminator check her house to see if she does have bedbugs.   Final Clinical Impressions(s) / UC Diagnoses   Final diagnoses:  Insect bite, unspecified site, initial encounter  Pruritus     Discharge Instructions      I have sent corticosteroid pills to help with swelling and the rash.  Have also sent a topical to help with this to.  Additionally I sent hydroxyzine to help with itching.  As we discussed if you continue to have new bumps pop up there is a possibility he could have bedbugs or something in your household that is continue to bite or sting you.  In this case you would need to contact an exterminator.     ED Prescriptions     Medication Sig Dispense Auth. Provider   methylPREDNISolone (MEDROL DOSEPAK) 4 MG TBPK tablet Take 6 tabs PO day 1 and decrease by 1 tab daily until complete 21 tablet Laurene Footman B, PA-C   hydrOXYzine (ATARAX/VISTARIL) 25 MG tablet Take 1 tablet (25 mg total) by mouth every 6 (six) hours as needed for up to 7 days for itching. 30 tablet Laurene Footman B, PA-C   triamcinolone ointment (KENALOG) 0.5 % Apply 1 application topically 2 (two) times daily for 10 days. 30 g Gretta Cool      PDMP not reviewed this encounter.   Danton Clap, PA-C 03/06/21 1652

## 2021-03-13 ENCOUNTER — Encounter: Payer: 59 | Attending: Surgery | Admitting: Skilled Nursing Facility1

## 2021-03-13 ENCOUNTER — Other Ambulatory Visit: Payer: Self-pay

## 2021-03-13 DIAGNOSIS — N189 Chronic kidney disease, unspecified: Secondary | ICD-10-CM

## 2021-03-13 DIAGNOSIS — E66813 Obesity, class 3: Secondary | ICD-10-CM

## 2021-03-14 NOTE — Progress Notes (Signed)
Pre-Operative Nutrition Class:    Patient was seen on 03/14/2021 for Pre-Operative Bariatric Surgery Education at [facility].    Surgery date:  Surgery type: RYGB Start weight at [facility]: 272 pounds Weight today: 261 pounds  Samples given per MNT protocol. Patient educated on appropriate usage: Bariatric Advantage Multivitamin Lot # [id number] Exp: 08/23   Procare Calcium  Lot # [id number] Exp: 03/23   Bariatric Advantage protein powder Lot # [id number] Exp: 10/23 Exp:   The following the learning objectives were met by the patient during this course: Identify Pre-Op Dietary Goals and will begin 2 weeks pre-operatively Identify appropriate sources of fluids and proteins  State protein recommendations and appropriate sources pre and post-operatively Identify Post-Operative Dietary Goals and will follow for 2 weeks post-operatively Identify appropriate multivitamin and calcium sources Describe the need for physical activity post-operatively and will follow MD recommendations State when to call healthcare provider regarding medication questions or post-operative complications When having a diagnosis of diabetes understanding hypoglycemia symptoms and the inclusion of 1 complex carbohydrate per meal  Handouts given during class include: Pre-Op Bariatric Surgery Diet Handout Protein Shake Handout Post-Op Bariatric Surgery Nutrition Handout BELT Program Information Flyer Support Group Information Flyer [facility] Bariatric Supplements Price List  Follow-Up Plan: Patient will follow-up at [facility] 2 weeks post operatively for diet advancement per MD.

## 2021-03-20 ENCOUNTER — Other Ambulatory Visit (HOSPITAL_COMMUNITY): Payer: Self-pay

## 2021-03-20 ENCOUNTER — Ambulatory Visit: Payer: Self-pay | Admitting: Surgery

## 2021-03-20 NOTE — Progress Notes (Signed)
DUE TO COVID-19 ONLY ONE VISITOR IS ALLOWED TO COME WITH YOU AND STAY IN THE WAITING ROOM ONLY DURING PRE OP AND PROCEDURE DAY OF SURGERY.  2 VISITOR  MAY VISIT WITH YOU AFTER SURGERY IN YOUR PRIVATE ROOM DURING VISITING HOURS ONLY!  YOU NEED TO HAVE A COVID 19 TEST ON_ 03/31/2021 ____AME'@_'$  '@_from'$  8am-3pm _____, THIS TEST MUST BE DONE BEFORE SURGERY,  Covid test is done at [place], [place] [address].  This is a drive thru.  No appt required. Please see map.                 Your procedure is scheduled on:  04/04/2021   Report to [facility] Main  Entrance   Report to admitting at    [phone]     Call this number if you have problems the morning of surgery [phone]    REMEMBER: NO  SOLID FOOD CANDY OR GUM AFTER MIDNIGHT. CLEAR LIQUIDS UNTIL  0415am        . NOTHING BY MOUTH EXCEPT CLEAR LIQUIDS UNTIL   0415am  . PLEASE FINISH ENSURE DRINK PER SURGEON ORDER  WHICH NEEDS TO BE COMPLETED AT   0415am    .      CLEAR LIQUID DIET   Foods Allowed                                                                    Coffee and tea, regular and decaf                            Fruit ices (not with fruit pulp)                                      Iced Popsicles                                    Carbonated beverages, regular and diet                                    Cranberry, grape and apple juices Sports drinks like Gatorade Lightly seasoned clear broth or consume(fat free) Sugar, honey syrup ___________________________________________________________________      BRUSH YOUR TEETH MORNING OF SURGERY AND RINSE YOUR MOUTH OUT, NO CHEWING GUM CANDY OR MINTS.     Take these medicines the morning of surgery with A SIP OF WATER: Nebulizer if needed, Inhalers as usual and bring, protonix, abilify, lexapro   DO NOT TAKE ANY DIABETIC MEDICATIONS DAY OF YOUR SURGERY                               You may not have any metal on your body including hair pins and               piercings  Do not wear jewelry, make-up, lotions, powders or perfumes, deodorant  Do not wear nail polish on your fingernails.  Do not shave  48 hours prior to surgery.              Men may shave face and neck.   Do not bring valuables to the hospital. [place].  Contacts, dentures or bridgework may not be worn into surgery.  Leave suitcase in the car. After surgery it may be brought to your room.     Patients discharged the day of surgery will not be allowed to drive home. IF YOU ARE HAVING SURGERY AND GOING HOME THE SAME DAY, YOU MUST HAVE AN ADULT TO DRIVE YOU HOME AND BE WITH YOU FOR 24 HOURS. YOU MAY GO HOME BY TAXI OR UBER OR ORTHERWISE, BUT AN ADULT MUST ACCOMPANY YOU HOME AND STAY WITH YOU FOR 24 HOURS.  Name and phone number of your driver:  Special Instructions: N/A              Please read over the following fact sheets you were given: _____________________________________________________________________  [facility] - Preparing for Surgery Before surgery, you can play an important role.  Because skin is not sterile, your skin needs to be as free of germs as possible.  You can reduce the number of germs on your skin by washing with CHG (chlorahexidine gluconate) soap before surgery.  CHG is an antiseptic cleaner which kills germs and bonds with the skin to continue killing germs even after washing. Please DO NOT use if you have an allergy to CHG or antibacterial soaps.  If your skin becomes reddened/irritated stop using the CHG and inform your nurse when you arrive at Short Stay. Do not shave (including legs and underarms) for at least 48 hours prior to the first CHG shower.  You may shave your face/neck. Please follow these instructions carefully:  1.  Shower with CHG Soap the night before surgery and the  morning of Surgery.  2.  If you choose to wash your hair, wash your hair first as usual with your  normal   shampoo.  3.  After you shampoo, rinse your hair and body thoroughly to remove the  shampoo.                           4.  Use CHG as you would any other liquid soap.  You can apply chg directly  to the skin and wash                       Gently with a scrungie or clean washcloth.  5.  Apply the CHG Soap to your body ONLY FROM THE NECK DOWN.   Do not use on face/ open                           Wound or open sores. Avoid contact with eyes, ears mouth and genitals (private parts).                       Wash face,  Genitals (private parts) with your normal soap.             6.  Wash thoroughly, paying special attention to the area where your surgery  will be performed.  7.  Thoroughly rinse your body with  warm water from the neck down.  8.  DO NOT shower/wash with your normal soap after using and rinsing off  the CHG Soap.                9.  Pat yourself dry with a clean towel.            10.  Wear clean pajamas.            11.  Place clean sheets on your bed the night of your first shower and do not  sleep with pets. Day of Surgery : Do not apply any lotions/deodorants the morning of surgery.  Please wear clean clothes to the hospital/surgery center.  FAILURE TO FOLLOW THESE INSTRUCTIONS MAY RESULT IN THE CANCELLATION OF YOUR SURGERY PATIENT SIGNATURE_________________________________  NURSE SIGNATURE__________________________________  ________________________________________________________________________

## 2021-03-20 NOTE — H&P (View-Only) (Signed)
Surgical Evaluation  Chief Complaint: morbid obesity  HPI: Since our last visit,  -She had bilateral lower extremity DVT ultrasound in March which was negative -She was admitted for an asthma exacerbation in April due to a viral lower respiratory tract infection with RSV -She underwent work-up at [facility] 01/16/2021 with anticipated pulmonary thromboendarterectomy, work-up included right and left heart cath, CTA of chest, and VQ scan, results included normal resting pulmonary hypertension and a normal-appearing VQ scan, resolution of PEs on CTA; given results she did not undergo endarterectomy surgery. -she underwent upper endoscopy 02/03/2021 with Dr. Andrey Farmer which was normal with biopsies negative for H. Pylori Has been cleared by dietitian Sandie Ano) -She met with a psychologist and is apparently approved, I do not have those notes to review at this time.   Today she is in much better spirits than her last visit and has completed all that was asked of her in addition to the standard bariatric surgery work-up.  Fortunately this has all been reassuring.  She continues to have a cough, reflux when she does not take her Protonix, continues to have persistent pain at the subxiphoid area.   She has completed the bariatric pathway and there are no barriers identified.  She has lost about 11 pounds since our initial visit which is fantastic.  Is taking the Eliquis, and is back on track getting her B12 injections.  She has several insightful questions to discuss today.     Initial visit 10/07/2020: 55 year old woman with multiple medical problems who presents for consultation regarding Roux-en-Y gastric bypass for treatment of reflux and obesity.  271.5 LB/BMI 46.6 She is currently at her highest weight, and in the last 2 or 3 years has found it nearly impossible to lose weight despite previous work with the nutritionist, diet plans, etc. She has chronic back pain which limits her activity  significantly.  She is essentially sedentary at work and at home. 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   She has a history of chronic cough for over a year refractory to steroids, inhalers, antitussives and antibiotic courses.  She has dyspnea with mild exertion.  She has never smoked, but often her work takes her to tobacco factories..  She did have some passive smoke exposure.  She did have COVID in September last year. She has a history of what sounds like unprovoked acute multiple bilateral pulmonary emboli in July 2021.  She was on Eliquis for 6 months and stopped it about a month ago.  She states that she never did meet with a hematologist.  She has a CT scan coming up to evaluate for resolution of the emboli.  Cardiac catheterization demonstrates mild pulmonary hypertension, which she is symptomatic from based on cardiomegaly on CT chest, lower extremity edema, dyspnea, lightheadedness, presyncope, fatigue, and was started on torsemide recently. -Needs bilateral lower extremity DVT ultrasounds as well Pulmonologist: Dr. Lanney Gins ([facility]).  Per last note, considering initiating Adempas and also notes considering evaluation for endarterectomy if this doesn't work?? Cardiologist Dr. Ubaldo Glassing w [facility]efm [facility] PCP Toni Arthurs NP   She also has a history of Chronic constipation, nonalcoholic fatty liver disease, GERD- describes left upper quadrant pain which is worse after eating-, hyperlipidemia, kidney stones, multiple gastric ulcers (currently taking Protonix  and Carafate), arthritis, depression and anxiety, hypothyroid, hypertension, hyperlipidemia, possibly sleep apnea, and vitamin B12 and vitamin D deficiency, prediabetes, stress incontinence   She did have an upper GI in 2021 showing a small  hiatal hernia and mild reflux.   Previous abdominal surgery includes laparoscopic cholecystectomy, hysterectomy which she states was done through a Pfannenstiel incision, and repair of a right ureter when she was 55 years old through a right lower flank incision.    Allergies  Allergen Reactions   Amoxicillin Rash   Augmentin [Amoxicillin-Pot Clavulanate] Itching and Rash    Past Medical History:  Diagnosis Date   Anxiety and depression    Arthritis    CHF (congestive heart failure) (HCC)    Chronic constipation    Chronic cough    Chronic kidney disease (CKD)    stage 3   Collagen vascular disease (HCC)    RA   CTEPH (chronic thromboembolic pulmonary hypertension) (HCC)    Fatty liver disease, nonalcoholic    GERD (gastroesophageal reflux disease)    Headache    Hemorrhoids    Herpes simplex vulvovaginitis    Hiatal hernia    History of kidney stones    History of stomach ulcers    HLD (hyperlipidemia)    Hypertension    Hypothyroid    Hypothyroidism    Liver disease    Lymphedema of right lower extremity    Morbid obesity (HCC)    Multiple gastric ulcers    Multiple gastric ulcers    Personal history of COVID-19    PONV (postoperative nausea and vomiting)    Pre-diabetes    Renal disorder    kidney stones and shortened ureter repaired with surgery as a child.   Seizures ([place])    only had with preeclampsia during pregnancy at age 49. none since   Sleep apnea    Stress incontinence (female) (female)    Vitamin B 12 deficiency    Vitamin D deficiency     Past Surgical History:  Procedure Laterality Date   BREAST SURGERY     reduction   CARPAL TUNNEL RELEASE Bilateral    CHOLECYSTECTOMY     CYSTOSCOPY WITH URETEROSCOPY, STONE BASKETRY AND STENT PLACEMENT  10/09/2016   Procedure: CYSTOSCOPY WITH URETEROSCOPY, STONE BASKETRY AND STENT PLACEMENT;  Surgeon: Hollice Espy, MD;  Location: [facility];  Service: Urology;;   DIRECT LARYNGOSCOPY Left 04/15/2015    Procedure: DIRECT LARYNGOSCOPY BIOPSY AND LASER LEFT TONGUE LESION;  Surgeon: Melissa Montane, MD;  Location: [facility];  Service: ENT;  Laterality: Left;   ESOPHAGOGASTRODUODENOSCOPY (EGD) WITH PROPOFOL N/A 09/03/2019   Procedure: ESOPHAGOGASTRODUODENOSCOPY (EGD) WITH PROPOFOL;  Surgeon: Toledo, Benay Pike, MD;  Location: [facility];  Service: Gastroenterology;  Laterality: N/A;   ESOPHAGOGASTRODUODENOSCOPY (EGD) WITH PROPOFOL N/A 02/03/2021   Procedure: ESOPHAGOGASTRODUODENOSCOPY (EGD) WITH PROPOFOL;  Surgeon: Lesly Rubenstein, MD;  Location: [facility];  Service: Endoscopy;  Laterality: N/A;  ELIQUIS   LEFT HEART CATH     REDUCTION MAMMAPLASTY     RIGHT HEART CATH N/A 06/02/2020   Procedure: RIGHT HEART CATH;  Surgeon: Yolonda Kida, MD;  Location: [facility];  Service: Cardiovascular;  Laterality: N/A;   TONSILLECTOMY     URETER SURGERY     VAGINAL HYSTERECTOMY  2000    Family History  Problem Relation Age of Onset   Kidney Stones Mother    Diabetes Mother    COPD Mother    Kidney Stones Father    Migraines  Maternal Grandmother    Stroke Maternal Grandmother    Diabetes Maternal Grandmother    Prostate cancer Maternal Grandfather    Kidney cancer Neg Hx    Bladder Cancer Neg Hx     Social History   Socioeconomic History   Marital status: Single    Spouse name: Not on file   Number of children: 1   Years of education: GED   Highest education level: Not on file  Occupational History   Occupation: Teacher, music  Tobacco Use   Smoking status: Never    Passive exposure: Yes   Smokeless tobacco: Never   Tobacco comments:    mother smokes outside/ mother deceased  Vaping Use   Vaping Use: Never used  Substance and Sexual Activity   Alcohol use: Not Currently    Comment: rarely   Drug use: No   Sexual activity: Yes  Other Topics Concern   Not on file  Social History Narrative   Lives alone   Caffeine use: Drink 1 glass  tea/day   Social Determinants of Health   Financial Resource Strain: Not on file  Food Insecurity: Not on file  Transportation Needs: Not on file  Physical Activity: Not on file  Stress: Not on file  Social Connections: Not on file    Current Outpatient Medications on File Prior to Visit  Medication Sig Dispense Refill   albuterol (PROVENTIL) (2.5 MG/3ML) 0.083% nebulizer solution Inhale 2.5 mg into the lungs every 4 (four) hours as needed for wheezing or shortness of breath.      apixaban (ELIQUIS) 5 MG TABS tablet Take 1 tablet (5 mg total) by mouth 2 (two) times daily. 60 tablet 1   ARIPiprazole (ABILIFY) 5 MG tablet Take 5 mg by mouth daily.     budesonide (PULMICORT) 0.5 MG/2ML nebulizer solution Take 0.5 mg by nebulization 2 (two) times daily.  (Patient not taking: No sig reported)     budesonide-formoterol (SYMBICORT) 160-4.5 MCG/ACT inhaler Inhale 2 puffs into the lungs in the morning and at bedtime.      Cholecalciferol 50 MCG (2000 UT) CAPS Take 2,000 Units by mouth daily.  (Patient not taking: No sig reported)     cyanocobalamin (,VITAMIN B-12,) 1000 MCG/ML injection Inject 1,000 mcg into the muscle every 30 (thirty) days.     escitalopram (LEXAPRO) 20 MG tablet Take 20 mg by mouth daily.     gabapentin (NEURONTIN) 600 MG tablet Take 300 mg by mouth 3 (three) times daily as needed (pain).      linaclotide (LINZESS) 72 MCG capsule Take 72 mcg by mouth daily before breakfast.     meclizine (ANTIVERT) 25 MG tablet Take 25 mg by mouth 3 (three) times daily as needed for dizziness.  (Patient not taking: Reported on 02/03/2021)     meloxicam (MOBIC) 7.5 MG tablet Take 7.5 mg by mouth daily.     methocarbamol (ROBAXIN) 750 MG tablet Take 375-750 mg by mouth 2 (two) times daily as needed for muscle spasms.  (Patient not taking: Reported on 02/03/2021)     methylPREDNISolone (MEDROL DOSEPAK) 4 MG TBPK tablet Take 6 tabs PO day 1 and decrease by 1 tab daily until complete 21 tablet 0    Olmesartan-amLODIPine-HCTZ 40-10-25 MG TABS Take 1 tablet by mouth daily.     ondansetron (ZOFRAN) 4 MG tablet Take 1 tablet (4 mg total) by mouth every 6 (six) hours as needed for nausea. 20 tablet 0   pantoprazole (PROTONIX) 40 MG tablet Take  40 mg by mouth 2 (two) times daily.      rosuvastatin (CRESTOR) 10 MG tablet Take 10 mg by mouth daily.     SUMAtriptan (IMITREX) 100 MG tablet Take 100 mg by mouth every 2 (two) hours as needed for migraine.  (Patient not taking: Reported on 02/03/2021)     torsemide (DEMADEX) 20 MG tablet Take 20 mg by mouth daily.     traMADol (ULTRAM) 50 MG tablet Take 50 mg by mouth every 6 (six) hours as needed for moderate pain.  (Patient not taking: Reported on 02/03/2021)     valACYclovir (VALTREX) 500 MG tablet Take 500 mg by mouth 2 (two) times daily as needed (outbreak).      [DISCONTINUED] fexofenadine (ALLEGRA) 180 MG tablet Take 180 mg by mouth daily. (Patient not taking: Reported on 01/11/2020)     [DISCONTINUED] RABEprazole (ACIPHEX) 20 MG tablet Take 20 mg by mouth daily.     [DISCONTINUED] ranitidine (ZANTAC) 150 MG tablet Take 150 mg by mouth at bedtime. (Patient not taking: Reported on 01/11/2020)     Current Facility-Administered Medications on File Prior to Visit  Medication Dose Route Frequency Provider Last Rate Last Admin   gadopentetate dimeglumine (MAGNEVIST) injection 20 mL  20 mL Intravenous Once PRN Garvin Fila, MD       sodium chloride flush (NS) 0.9 % injection 3 mL  3 mL Intravenous Q12H Teodoro Spray, MD        Review of Systems: a complete, 10pt review of systems was completed with pertinent positives and negatives as documented in the HPI  Physical Exam: BP: (!) 160/90  Pulse: (!) 123  Temp: 36.4 C (97.6 F)  SpO2: 100%  Weight: (!) 118.1 kg (260 lb 6.4 oz)  Height: 162.6 cm ($RemoveB'5\' 4"'JrPaAQqV$ )    Body mass index is 44.7 kg/m.   Alert, well-appearing Unlabored respiration   CBC Latest Ref Rng & Units 05/02/2020 02/18/2020 02/17/2020   WBC 4.0 - 10.5 K/uL 11.8(H) 7.1 5.9  Hemoglobin 12.0 - 15.0 g/dL 13.7 12.6 12.1  Hematocrit 36.0 - 46.0 % 40.8 35.6(L) 35.5(L)  Platelets 150 - 400 K/uL 225 221 212    CMP Latest Ref Rng & Units 10/12/2020 05/02/2020 02/17/2020  Glucose 70 - 99 mg/dL - 91 106(H)  BUN 6 - 20 mg/dL - 22(H) 12  Creatinine 0.44 - 1.00 mg/dL 0.80 0.84 0.99  Sodium 135 - 145 mmol/L - 139 139  Potassium 3.5 - 5.1 mmol/L - 3.6 3.2(L)  Chloride 98 - 111 mmol/L - 105 102  CO2 22 - 32 mmol/L - 23 27  Calcium 8.9 - 10.3 mg/dL - 8.6(L) 9.2  Total Protein 6.5 - 8.1 g/dL - 6.6 -  Total Bilirubin 0.3 - 1.2 mg/dL - 0.7 -  Alkaline Phos 38 - 126 U/L - 51 -  AST 15 - 41 U/L - 18 -  ALT 0 - 44 U/L - 20 -    Lab Results  Component Value Date   INR 0.9 05/02/2020   INR 1.0 02/16/2020    Imaging: No results found.   A/P:  Morbid obesity (CMS-HCC) -     CCS Bariatric Case Posting Request; Future     MORBID OBESITY (E66.01), GERD (GASTROESOPHAGEAL REFLUX DISEASE) (K21.9) with small hiatal hernia and mild reflux noted on upper GI in November 2021, chronic cough likely atypical reflux symptoms             -She has completed the bariatric pathway with no barriers identified.  She has also had a repeat CTA demonstrating resolution of her pulmonary emboli, bilateral lower extremity DVT ultrasounds which were negative, left and right heart cath and VQ scan all of which were reassuring.  She had an upper endoscopy ruling out any ulcers and negative for H. pylori.             -At this point I believe she is acceptable candidate for Roux-en-Y gastric bypass. We discussed the surgery including technical aspects, the risks of bleeding, infection, pain, scarring, injury to intra-abdominal structures, staple line leak or abscess, chronic abdominal pain or nausea, DVT/PE, pneumonia, heart attack, stroke, death, failure to reach weight loss goals and weight regain, hernia, marginal ulcer, bowel obstruction, persistent abdominal pain.   Discussed the typical peri-, and postoperative course.  Discussed the importance of lifelong behavioral changes to combat the chronic and relapsing disease which is obesity.      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  Repeat CTA demonstrates resolution, DVT ultrasound negative in March of this year.   VITAMIN D DEFICIENCY (E55.9), VITAMIN B12 DEFICIENCY (E53.8)-getting B12 injections STRESS INCONTINENCE (N39.3) SLEEP APNEA (G47.30) PREDIABETES (R73.03) MULTIPLE GASTRIC ULCERS (K25.9)-resolved on recent endoscopy NONALCOHOLIC FATTY LIVER DISEASE (K76.0) HYPOTHYROID (E03.9) HYPERTENSION (I10) HYPERLIPIDEMIA (E78.5) ARTHRITIS (M19.90) ANXIETY AND DEPRESSION (F41.9) CHRONIC COUGH (R05.3) Story: Is seen by a pulmonologist at [facility] as above, refractory to multiple medications and is now thought to be secondary to atypical GERD. SEDENTARY LIFESTYLE (Z91.89) Story: Past the patient to work on incorporating more activity and her daily life- understand that she is limited by pain as well as fatigue from long days of work, but this would probably be a good starting point for improving her overall health and stamina to improve her risk profile if she does end up proceeding with bariatric surgery.    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 12/15/2019   Chronic pain syndrome 10/22/2019   Chronic bilateral low back pain without sciatica 10/22/2019   SI (sacroiliac) joint inflammation ([place]) 10/22/2019   Chronic SI joint pain 10/22/2019   Insect bite of left elbow 10/06/2019   Pruritic rash 09/11/2019   Osteopenia of multiple sites 08/31/2019   Swelling of limb 08/28/2019   Pain in limb 08/28/2019   Varicose veins of leg with pain, right 08/20/2019   Stroke-like symptoms 08/20/2019   Bilateral hip pain 08/20/2019   Prediabetes  08/18/2019   Vitamin B12 deficiency 08/18/2019   Vitamin D deficiency 08/18/2019   Lymphedema of right lower extremity 08/14/2019   Stress incontinence in female 08/14/2019   Recurrent major depressive disorder, in partial remission ([place]) 01/14/2018   Obstructive sleep apnea syndrome 10/18/2017   Sialadenitis 10/17/2017   SOB (shortness of breath) 09/15/2017   Chronic cough 08/01/2017   Obstructive uropathy 10/08/2016   Ureterolithiasis    Acute right flank pain    Intractable pain    Chronic renal disease, stage 3, moderately decreased glomerular filtration rate between 30-59 mL/min/1.73 square meter ([place]) 07/02/2016   Essential hypertension 06/22/2016   Intractable chronic migraine without aura and with status migrainosus 12/02/2015   Chronic daily headache 11/16/2015   Tension headache 11/16/2015   Chronic pansinusitis 10/24/2015   Chronic tension-type headache, intractable 10/24/2015   Hemorrhoids 10/24/2015   Hyperlipidemia, unspecified 10/24/2015   Herpesviral vulvovaginitis 11/17/2014   Multiple gastric ulcers 04/16/2014   Obesity,  Class III, BMI 40-49.9 (morbid obesity) ([place]) 03/17/2014   Epigastric pain 03/17/2014   Fatty (change of) liver, not elsewhere classified 03/17/2014   Gastroesophageal reflux disease 03/17/2014   Hypothyroidism, unspecified 10/23/2012   Thoracic back pain 10/23/2012   Myalgia, other site 10/08/2012   Calculus of kidney 05/30/2011   Carpal tunnel syndrome 05/30/2011       Romana Juniper, MD [facility]  See Shea Evans to contact appropriate on-call provider

## 2021-03-20 NOTE — H&P (Signed)
Surgical Evaluation  Chief Complaint: morbid obesity  HPI: Since our last visit,  -She had bilateral lower extremity DVT ultrasound in March which was negative -She was admitted for an asthma exacerbation in April due to a viral lower respiratory tract infection with RSV -She underwent work-up at [facility] 01/16/2021 with anticipated pulmonary thromboendarterectomy, work-up included right and left heart cath, CTA of chest, and VQ scan, results included normal resting pulmonary hypertension and a normal-appearing VQ scan, resolution of PEs on CTA; given results she did not undergo endarterectomy surgery. -she underwent upper endoscopy 02/03/2021 with Dr. Andrey Farmer which was normal with biopsies negative for H. Pylori Has been cleared by dietitian Sandie Ano) -She met with a psychologist and is apparently approved, I do not have those notes to review at this time.   Today she is in much better spirits than her last visit and has completed all that was asked of her in addition to the standard bariatric surgery work-up.  Fortunately this has all been reassuring.  She continues to have a cough, reflux when she does not take her Protonix, continues to have persistent pain at the subxiphoid area.   She has completed the bariatric pathway and there are no barriers identified.  She has lost about 11 pounds since our initial visit which is fantastic.  Is taking the Eliquis, and is back on track getting her B12 injections.  She has several insightful questions to discuss today.     Initial visit 10/07/2020: 55 year old woman with multiple medical problems who presents for consultation regarding Roux-en-Y gastric bypass for treatment of reflux and obesity.  271.5 LB/BMI 46.6 She is currently at her highest weight, and in the last 2 or 3 years has found it nearly impossible to lose weight despite previous work with the nutritionist, diet plans, etc. She has chronic back pain which limits her activity  significantly.  She is essentially sedentary at work and at home. 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   She has a history of chronic cough for over a year refractory to steroids, inhalers, antitussives and antibiotic courses.  She has dyspnea with mild exertion.  She has never smoked, but often her work takes her to tobacco factories..  She did have some passive smoke exposure.  She did have COVID in September last year. She has a history of what sounds like unprovoked acute multiple bilateral pulmonary emboli in July 2021.  She was on Eliquis for 6 months and stopped it about a month ago.  She states that she never did meet with a hematologist.  She has a CT scan coming up to evaluate for resolution of the emboli.  Cardiac catheterization demonstrates mild pulmonary hypertension, which she is symptomatic from based on cardiomegaly on CT chest, lower extremity edema, dyspnea, lightheadedness, presyncope, fatigue, and was started on torsemide recently. -Needs bilateral lower extremity DVT ultrasounds as well Pulmonologist: Dr. Lanney Gins ([facility]).  Per last note, considering initiating Adempas and also notes considering evaluation for endarterectomy if this doesn't work?? Cardiologist Dr. Ubaldo Glassing w [facility] Jefm Bryant PCP Toni Arthurs NP   She also has a history of Chronic constipation, nonalcoholic fatty liver disease, GERD- describes left upper quadrant pain which is worse after eating-, hyperlipidemia, kidney stones, multiple gastric ulcers (currently taking Protonix  and Carafate), arthritis, depression and anxiety, hypothyroid, hypertension, hyperlipidemia, possibly sleep apnea, and vitamin B12 and vitamin D deficiency, prediabetes, stress incontinence   She did have an upper GI in 2021 showing a small  hiatal hernia and mild reflux.   Previous abdominal surgery includes laparoscopic cholecystectomy, hysterectomy which she states was done through a Pfannenstiel incision, and repair of a right ureter when she was 55 years old through a right lower flank incision.    Allergies  Allergen Reactions   Amoxicillin Rash   Augmentin [Amoxicillin-Pot Clavulanate] Itching and Rash    Past Medical History:  Diagnosis Date   Anxiety and depression    Arthritis    CHF (congestive heart failure) (HCC)    Chronic constipation    Chronic cough    Chronic kidney disease (CKD)    stage 3   Collagen vascular disease (HCC)    RA   CTEPH (chronic thromboembolic pulmonary hypertension) (HCC)    Fatty liver disease, nonalcoholic    GERD (gastroesophageal reflux disease)    Headache    Hemorrhoids    Herpes simplex vulvovaginitis    Hiatal hernia    History of kidney stones    History of stomach ulcers    HLD (hyperlipidemia)    Hypertension    Hypothyroid    Hypothyroidism    Liver disease    Lymphedema of right lower extremity    Morbid obesity (HCC)    Multiple gastric ulcers    Multiple gastric ulcers    Personal history of COVID-19    PONV (postoperative nausea and vomiting)    Pre-diabetes    Renal disorder    kidney stones and shortened ureter repaired with surgery as a child.   Seizures ([place])    only had with preeclampsia during pregnancy at age 49. none since   Sleep apnea    Stress incontinence (female) (female)    Vitamin B 12 deficiency    Vitamin D deficiency     Past Surgical History:  Procedure Laterality Date   BREAST SURGERY     reduction   CARPAL TUNNEL RELEASE Bilateral    CHOLECYSTECTOMY     CYSTOSCOPY WITH URETEROSCOPY, STONE BASKETRY AND STENT PLACEMENT  10/09/2016   Procedure: CYSTOSCOPY WITH URETEROSCOPY, STONE BASKETRY AND STENT PLACEMENT;  Surgeon: Hollice Espy, MD;  Location: [facility];  Service: Urology;;   DIRECT LARYNGOSCOPY Left 04/15/2015    Procedure: DIRECT LARYNGOSCOPY BIOPSY AND LASER LEFT TONGUE LESION;  Surgeon: Melissa Montane, MD;  Location: [facility];  Service: ENT;  Laterality: Left;   ESOPHAGOGASTRODUODENOSCOPY (EGD) WITH PROPOFOL N/A 09/03/2019   Procedure: ESOPHAGOGASTRODUODENOSCOPY (EGD) WITH PROPOFOL;  Surgeon: Toledo, Benay Pike, MD;  Location: [facility];  Service: Gastroenterology;  Laterality: N/A;   ESOPHAGOGASTRODUODENOSCOPY (EGD) WITH PROPOFOL N/A 02/03/2021   Procedure: ESOPHAGOGASTRODUODENOSCOPY (EGD) WITH PROPOFOL;  Surgeon: Lesly Rubenstein, MD;  Location: [facility];  Service: Endoscopy;  Laterality: N/A;  ELIQUIS   LEFT HEART CATH     REDUCTION MAMMAPLASTY     RIGHT HEART CATH N/A 06/02/2020   Procedure: RIGHT HEART CATH;  Surgeon: Yolonda Kida, MD;  Location: [facility];  Service: Cardiovascular;  Laterality: N/A;   TONSILLECTOMY     URETER SURGERY     VAGINAL HYSTERECTOMY  2000    Family History  Problem Relation Age of Onset   Kidney Stones Mother    Diabetes Mother    COPD Mother    Kidney Stones Father    Migraines  Maternal Grandmother    Stroke Maternal Grandmother    Diabetes Maternal Grandmother    Prostate cancer Maternal Grandfather    Kidney cancer Neg Hx    Bladder Cancer Neg Hx     Social History   Socioeconomic History   Marital status: Single    Spouse name: Not on file   Number of children: 1   Years of education: GED   Highest education level: Not on file  Occupational History   Occupation: Teacher, music  Tobacco Use   Smoking status: Never    Passive exposure: Yes   Smokeless tobacco: Never   Tobacco comments:    mother smokes outside/ mother deceased  Vaping Use   Vaping Use: Never used  Substance and Sexual Activity   Alcohol use: Not Currently    Comment: rarely   Drug use: No   Sexual activity: Yes  Other Topics Concern   Not on file  Social History Narrative   Lives alone   Caffeine use: Drink 1 glass  tea/day   Social Determinants of Health   Financial Resource Strain: Not on file  Food Insecurity: Not on file  Transportation Needs: Not on file  Physical Activity: Not on file  Stress: Not on file  Social Connections: Not on file    Current Outpatient Medications on File Prior to Visit  Medication Sig Dispense Refill   albuterol (PROVENTIL) (2.5 MG/3ML) 0.083% nebulizer solution Inhale 2.5 mg into the lungs every 4 (four) hours as needed for wheezing or shortness of breath.      apixaban (ELIQUIS) 5 MG TABS tablet Take 1 tablet (5 mg total) by mouth 2 (two) times daily. 60 tablet 1   ARIPiprazole (ABILIFY) 5 MG tablet Take 5 mg by mouth daily.     budesonide (PULMICORT) 0.5 MG/2ML nebulizer solution Take 0.5 mg by nebulization 2 (two) times daily.  (Patient not taking: No sig reported)     budesonide-formoterol (SYMBICORT) 160-4.5 MCG/ACT inhaler Inhale 2 puffs into the lungs in the morning and at bedtime.      Cholecalciferol 50 MCG (2000 UT) CAPS Take 2,000 Units by mouth daily.  (Patient not taking: No sig reported)     cyanocobalamin (,VITAMIN B-12,) 1000 MCG/ML injection Inject 1,000 mcg into the muscle every 30 (thirty) days.     escitalopram (LEXAPRO) 20 MG tablet Take 20 mg by mouth daily.     gabapentin (NEURONTIN) 600 MG tablet Take 300 mg by mouth 3 (three) times daily as needed (pain).      linaclotide (LINZESS) 72 MCG capsule Take 72 mcg by mouth daily before breakfast.     meclizine (ANTIVERT) 25 MG tablet Take 25 mg by mouth 3 (three) times daily as needed for dizziness.  (Patient not taking: Reported on 02/03/2021)     meloxicam (MOBIC) 7.5 MG tablet Take 7.5 mg by mouth daily.     methocarbamol (ROBAXIN) 750 MG tablet Take 375-750 mg by mouth 2 (two) times daily as needed for muscle spasms.  (Patient not taking: Reported on 02/03/2021)     methylPREDNISolone (MEDROL DOSEPAK) 4 MG TBPK tablet Take 6 tabs PO day 1 and decrease by 1 tab daily until complete 21 tablet 0    Olmesartan-amLODIPine-HCTZ 40-10-25 MG TABS Take 1 tablet by mouth daily.     ondansetron (ZOFRAN) 4 MG tablet Take 1 tablet (4 mg total) by mouth every 6 (six) hours as needed for nausea. 20 tablet 0   pantoprazole (PROTONIX) 40 MG tablet Take  40 mg by mouth 2 (two) times daily.      rosuvastatin (CRESTOR) 10 MG tablet Take 10 mg by mouth daily.     SUMAtriptan (IMITREX) 100 MG tablet Take 100 mg by mouth every 2 (two) hours as needed for migraine.  (Patient not taking: Reported on 02/03/2021)     torsemide (DEMADEX) 20 MG tablet Take 20 mg by mouth daily.     traMADol (ULTRAM) 50 MG tablet Take 50 mg by mouth every 6 (six) hours as needed for moderate pain.  (Patient not taking: Reported on 02/03/2021)     valACYclovir (VALTREX) 500 MG tablet Take 500 mg by mouth 2 (two) times daily as needed (outbreak).      [DISCONTINUED] fexofenadine (ALLEGRA) 180 MG tablet Take 180 mg by mouth daily. (Patient not taking: Reported on 01/11/2020)     [DISCONTINUED] RABEprazole (ACIPHEX) 20 MG tablet Take 20 mg by mouth daily.     [DISCONTINUED] ranitidine (ZANTAC) 150 MG tablet Take 150 mg by mouth at bedtime. (Patient not taking: Reported on 01/11/2020)     Current Facility-Administered Medications on File Prior to Visit  Medication Dose Route Frequency Provider Last Rate Last Admin   gadopentetate dimeglumine (MAGNEVIST) injection 20 mL  20 mL Intravenous Once PRN Garvin Fila, MD       sodium chloride flush (NS) 0.9 % injection 3 mL  3 mL Intravenous Q12H Teodoro Spray, MD        Review of Systems: a complete, 10pt review of systems was completed with pertinent positives and negatives as documented in the HPI  Physical Exam: BP: (!) 160/90  Pulse: (!) 123  Temp: 36.4 C (97.6 F)  SpO2: 100%  Weight: (!) 118.1 kg (260 lb 6.4 oz)  Height: 162.6 cm ($RemoveB'5\' 4"'ktFalPzO$ )    Body mass index is 44.7 kg/m.   Alert, well-appearing Unlabored respiration   CBC Latest Ref Rng & Units 05/02/2020 02/18/2020 02/17/2020   WBC 4.0 - 10.5 K/uL 11.8(H) 7.1 5.9  Hemoglobin 12.0 - 15.0 g/dL 13.7 12.6 12.1  Hematocrit 36.0 - 46.0 % 40.8 35.6(L) 35.5(L)  Platelets 150 - 400 K/uL 225 221 212    CMP Latest Ref Rng & Units 10/12/2020 05/02/2020 02/17/2020  Glucose 70 - 99 mg/dL - 91 106(H)  BUN 6 - 20 mg/dL - 22(H) 12  Creatinine 0.44 - 1.00 mg/dL 0.80 0.84 0.99  Sodium 135 - 145 mmol/L - 139 139  Potassium 3.5 - 5.1 mmol/L - 3.6 3.2(L)  Chloride 98 - 111 mmol/L - 105 102  CO2 22 - 32 mmol/L - 23 27  Calcium 8.9 - 10.3 mg/dL - 8.6(L) 9.2  Total Protein 6.5 - 8.1 g/dL - 6.6 -  Total Bilirubin 0.3 - 1.2 mg/dL - 0.7 -  Alkaline Phos 38 - 126 U/L - 51 -  AST 15 - 41 U/L - 18 -  ALT 0 - 44 U/L - 20 -    Lab Results  Component Value Date   INR 0.9 05/02/2020   INR 1.0 02/16/2020    Imaging: No results found.   A/P:  Morbid obesity (CMS-HCC) -     CCS Bariatric Case Posting Request; Future     MORBID OBESITY (E66.01), GERD (GASTROESOPHAGEAL REFLUX DISEASE) (K21.9) with small hiatal hernia and mild reflux noted on upper GI in November 2021, chronic cough likely atypical reflux symptoms             -She has completed the bariatric pathway with no barriers identified.  She has also had a repeat CTA demonstrating resolution of her pulmonary emboli, bilateral lower extremity DVT ultrasounds which were negative, left and right heart cath and VQ scan all of which were reassuring.  She had an upper endoscopy ruling out any ulcers and negative for H. pylori.             -At this point I believe she is acceptable candidate for Roux-en-Y gastric bypass. We discussed the surgery including technical aspects, the risks of bleeding, infection, pain, scarring, injury to intra-abdominal structures, staple line leak or abscess, chronic abdominal pain or nausea, DVT/PE, pneumonia, heart attack, stroke, death, failure to reach weight loss goals and weight regain, hernia, marginal ulcer, bowel obstruction, persistent abdominal pain.   Discussed the typical peri-, and postoperative course.  Discussed the importance of lifelong behavioral changes to combat the chronic and relapsing disease which is obesity.      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  Repeat CTA demonstrates resolution, DVT ultrasound negative in March of this year.   VITAMIN D DEFICIENCY (E55.9), VITAMIN B12 DEFICIENCY (E53.8)-getting B12 injections STRESS INCONTINENCE (N39.3) SLEEP APNEA (G47.30) PREDIABETES (R73.03) MULTIPLE GASTRIC ULCERS (K25.9)-resolved on recent endoscopy NONALCOHOLIC FATTY LIVER DISEASE (K76.0) HYPOTHYROID (E03.9) HYPERTENSION (I10) HYPERLIPIDEMIA (E78.5) ARTHRITIS (M19.90) ANXIETY AND DEPRESSION (F41.9) CHRONIC COUGH (R05.3) Story: Is seen by a pulmonologist at [facility] as above, refractory to multiple medications and is now thought to be secondary to atypical GERD. SEDENTARY LIFESTYLE (Z91.89) Story: Past the patient to work on incorporating more activity and her daily life- understand that she is limited by pain as well as fatigue from long days of work, but this would probably be a good starting point for improving her overall health and stamina to improve her risk profile if she does end up proceeding with bariatric surgery.    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 12/15/2019   Chronic pain syndrome 10/22/2019   Chronic bilateral low back pain without sciatica 10/22/2019   SI (sacroiliac) joint inflammation ([place]) 10/22/2019   Chronic SI joint pain 10/22/2019   Insect bite of left elbow 10/06/2019   Pruritic rash 09/11/2019   Osteopenia of multiple sites 08/31/2019   Swelling of limb 08/28/2019   Pain in limb 08/28/2019   Varicose veins of leg with pain, right 08/20/2019   Stroke-like symptoms 08/20/2019   Bilateral hip pain 08/20/2019   Prediabetes  08/18/2019   Vitamin B12 deficiency 08/18/2019   Vitamin D deficiency 08/18/2019   Lymphedema of right lower extremity 08/14/2019   Stress incontinence in female 08/14/2019   Recurrent major depressive disorder, in partial remission ([place]) 01/14/2018   Obstructive sleep apnea syndrome 10/18/2017   Sialadenitis 10/17/2017   SOB (shortness of breath) 09/15/2017   Chronic cough 08/01/2017   Obstructive uropathy 10/08/2016   Ureterolithiasis    Acute right flank pain    Intractable pain    Chronic renal disease, stage 3, moderately decreased glomerular filtration rate between 30-59 mL/min/1.73 square meter ([place]) 07/02/2016   Essential hypertension 06/22/2016   Intractable chronic migraine without aura and with status migrainosus 12/02/2015   Chronic daily headache 11/16/2015   Tension headache 11/16/2015   Chronic pansinusitis 10/24/2015   Chronic tension-type headache, intractable 10/24/2015   Hemorrhoids 10/24/2015   Hyperlipidemia, unspecified 10/24/2015   Herpesviral vulvovaginitis 11/17/2014   Multiple gastric ulcers 04/16/2014   Obesity,  Class III, BMI 40-49.9 (morbid obesity) ([place]) 03/17/2014   Epigastric pain 03/17/2014   Fatty (change of) liver, not elsewhere classified 03/17/2014   Gastroesophageal reflux disease 03/17/2014   Hypothyroidism, unspecified 10/23/2012   Thoracic back pain 10/23/2012   Myalgia, other site 10/08/2012   Calculus of kidney 05/30/2011   Carpal tunnel syndrome 05/30/2011       Romana Juniper, MD [facility]  See Shea Evans to contact appropriate on-call provider

## 2021-03-24 ENCOUNTER — Encounter (HOSPITAL_COMMUNITY)
Admission: RE | Admit: 2021-03-24 | Discharge: 2021-03-24 | Disposition: A | Discharge status: Home / Self Care | Payer: 59 | Source: Ambulatory Visit | Attending: Surgery | Admitting: Surgery

## 2021-03-24 ENCOUNTER — Encounter (HOSPITAL_COMMUNITY): Payer: Self-pay

## 2021-03-24 ENCOUNTER — Other Ambulatory Visit: Payer: Self-pay

## 2021-03-24 DIAGNOSIS — Z01812 Encounter for preprocedural laboratory examination: Secondary | ICD-10-CM | POA: Diagnosis not present

## 2021-03-24 HISTORY — DX: Other pulmonary embolism without acute cor pulmonale: I26.99

## 2021-03-24 LAB — COMPREHENSIVE METABOLIC PANEL
ALT: 31 U/L (ref 0–44)
AST: 28 U/L (ref 15–41)
Albumin: 4 g/dL (ref 3.5–5.0)
Alkaline Phosphatase: 61 U/L (ref 38–126)
Anion gap: 9 (ref 5–15)
BUN: 16 mg/dL (ref 6–20)
CO2: 23 mmol/L (ref 22–32)
Calcium: 9.8 mg/dL (ref 8.9–10.3)
Chloride: 107 mmol/L (ref 98–111)
Creatinine, Ser: 0.74 mg/dL (ref 0.44–1.00)
GFR, Estimated: >60 mL/min (ref >60)
Glucose, Bld: 109 mg/dL — ABNORMAL HIGH (ref 70–99)
Potassium: 4.2 mmol/L (ref 3.5–5.1)
Sodium: 139 mmol/L (ref 135–145)
Total Bilirubin: 0.7 mg/dL (ref 0.3–1.2)
Total Protein: 7.5 g/dL (ref 6.5–8.1)

## 2021-03-24 LAB — CBC WITH DIFFERENTIAL/PLATELET
Abs Immature Granulocytes: 0.02 10*3/uL (ref 0.00–0.07)
Basophils Absolute: 0 10*3/uL (ref 0.0–0.1)
Basophils Relative: 0 %
Eosinophils Absolute: 0.1 10*3/uL (ref 0.0–0.5)
Eosinophils Relative: 2 %
HCT: 41.5 % (ref 36.0–46.0)
Hemoglobin: 13.5 g/dL (ref 12.0–15.0)
Immature Granulocytes: 0 %
Lymphocytes Relative: 47 %
Lymphs Abs: 3.1 10*3/uL (ref 0.7–4.0)
MCH: 31.2 pg (ref 26.0–34.0)
MCHC: 32.5 g/dL (ref 30.0–36.0)
MCV: 95.8 fL (ref 80.0–100.0)
Monocytes Absolute: 0.8 10*3/uL (ref 0.1–1.0)
Monocytes Relative: 11 %
Neutro Abs: 2.7 10*3/uL (ref 1.7–7.7)
Neutrophils Relative %: 40 %
Platelets: 219 10*3/uL (ref 150–400)
RBC: 4.33 MIL/uL (ref 3.87–5.11)
RDW: 12.6 % (ref 11.5–15.5)
WBC: 6.8 10*3/uL (ref 4.0–10.5)
nRBC: 0 % (ref 0.0–0.2)

## 2021-03-24 LAB — TYPE AND SCREEN
ABO/RH(D): O NEG
Antibody Screen: NEGATIVE

## 2021-03-24 LAB — HEMOGLOBIN A1C
Hgb A1c MFr Bld: 6.6 % — ABNORMAL HIGH (ref 4.8–5.6)
Mean Plasma Glucose: 142.72 mg/dL

## 2021-03-24 NOTE — Progress Notes (Addendum)
Anesthesia Review:  PCP: Toni Arthurs [facility] in [place] 10/21/20  Cardiologist : DR Ubaldo Glassing [id number].   Attempted to call Cardiology [facility].  They close at 3pm on Fridays.  Will call on 03/28/2021 to obtain LOV note.  Have requested LOV note on 03/28/21.  They are to fax. 07/13/20- LOV DR Fath on chart.  Pulmonology- Dr Lanney Gins [phone] [place] 11/07/20  No clearances at [place] office.  Chest x-ray : 10/18/20  and 01/16/21  EKG : 05/03/2020  and 01/16/21.   Have requested 12 lead ekg tracing via fax from [place].   Echo : 02/18/2020  02/03/21- endoscopy  01/17/21- Lung perfusion  01/17/21- Ct Chest  Stress test: Cardiac Cath : 05/2020 AND 01/17/21.  Activity level: can do a flight of stairs withouit difficulty  Sleep Study/ CPAP : none  Fasting Blood Sugar :      / Checks Blood Sugar -- times a day:   Blood Thinner/ Instructions /Last Dose: ASA / Instructions/ Last Dose :   COVID TEST ON 03/31/2021 POSITIVE FOR COVID IN Epic ON 12/21/20.  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  PT voiced understanding.  Covid test on 03/31/21.

## 2021-03-31 ENCOUNTER — Other Ambulatory Visit: Payer: Self-pay | Admitting: Surgery

## 2021-03-31 NOTE — Anesthesia Preprocedure Evaluation (Addendum)
Anesthesia Evaluation  Patient identified by MRN, date of birth, ID band Patient awake    Reviewed: Allergy & Precautions, NPO status , Patient's Chart, lab work & pertinent test results  Airway Mallampati: II  TM Distance: >3 FB     Dental   Pulmonary sleep apnea , COPD,    breath sounds clear to auscultation       Cardiovascular hypertension,  Rhythm:Regular Rate:Normal     Neuro/Psych  Headaches, Seizures -,   Neuromuscular disease    GI/Hepatic hiatal hernia, PUD, GERD  ,  Endo/Other  Hypothyroidism   Renal/GU Renal disease     Musculoskeletal  (+) Arthritis ,   Abdominal   Peds  Hematology   Anesthesia Other Findings   Reproductive/Obstetrics                           Anesthesia Physical Anesthesia Plan  ASA: 3  Anesthesia Plan: General   Post-op Pain Management:    Induction:   PONV Risk Score and Plan: 3 and Ondansetron, Dexamethasone and Midazolam  Airway Management Planned: Oral ETT  Additional Equipment:   Intra-op Plan:   Post-operative Plan: Possible Post-op intubation/ventilation  Informed Consent: I have reviewed the patients History and Physical, chart, labs and discussed the procedure including the risks, benefits and alternatives for the proposed anesthesia with the patient or authorized representative who has indicated his/her understanding and acceptance.     Dental advisory given  Plan Discussed with: Anesthesiologist and CRNA  Anesthesia Plan Comments: (See PAT note 03/24/21, Konrad Felix Ward, PA-C)      Anesthesia Quick Evaluation

## 2021-03-31 NOTE — Progress Notes (Signed)
Anesthesia Chart Review   Case: [id number] Date/Time: 04/04/21 0700   Procedures:      LAPAROSCOPIC ROUX-EN-Y GASTRIC BYPASS WITH UPPER ENDOSCOPY     UPPER GI ENDOSCOPY   Anesthesia type: General   Pre-op diagnosis: morbid obesity   Location: [facility] ROOM 02 / [facility]   Surgeons: Clovis Riley, MD       DISCUSSION:55 y.o. never smoker with h/o PONV, GERD, HTN, CKD Stage III, PE on Eliquis, mild pulmonary HTN, morbid obesity scheduled for above procedure 04/04/2021 with Dr. Romana Juniper.   Pt seen by PCP 03/28/2021 for preoperative evaluation.  Per OV note, "Overall, pt is stable and cleared for surgery."  BP at this visit 130/78. BP elevated at PAT visit.  Evaluate DOS.   Pt has been advised to hold Eliquis 3-4 days prior to surgery.  VS: BP (!) 156/94   Pulse 72   Temp 36.8 C (Oral)   Resp 16   Ht '5\' 5"'$  (1.651 m)   Wt 117.5 kg   SpO2 99%   BMI 43.10 kg/m   PROVIDERS: Toni Arthurs, NP  Bartholome Bill, MD is Cardiologist  LABS: Labs reviewed: Acceptable for surgery. (all labs ordered are listed, but only abnormal results are displayed)  Labs Reviewed  COMPREHENSIVE METABOLIC PANEL - Abnormal; Notable for the following components:      Result Value   Glucose, Bld 109 (*)    All other components within normal limits  CBC WITH DIFFERENTIAL/PLATELET  TYPE AND SCREEN     IMAGES:   EKG: 05/03/2020 Rate 88 bpm  NSR Cannot rule out anterior infarct, age undetermined   CV: Echo 02/16/2020  1. Left ventricular ejection fraction, by estimation, is 60 to 65%. The  left ventricle has normal function. The left ventricle has no regional  wall motion abnormalities. Left ventricular diastolic parameters were  normal.   2. Right ventricular systolic function is normal. The right ventricular  size is normal.   3. The mitral valve is normal in structure. Trivial mitral valve  regurgitation.   4. The aortic valve is normal in structure. Aortic valve regurgitation is  not  visualized.   Echo 06/02/2020   Hemodynamic findings consistent with mild pulmonary hypertension. Mean wedge of 20 Mean PA of 30   Conclusion Mild pulmonary hypertension on right heart cath Mean wedge of 20 Mean PA of 30 No complications Past Medical History:  Diagnosis Date   Anxiety and depression    Arthritis    Chronic constipation    Chronic cough    Chronic kidney disease (CKD)    stage 3   Collagen vascular disease (HCC)    RA   CTEPH (chronic thromboembolic pulmonary hypertension) (HCC)    Fatty liver disease, nonalcoholic    GERD (gastroesophageal reflux disease)    Headache    Hemorrhoids    Herpes simplex vulvovaginitis    Hiatal hernia    History of kidney stones    History of stomach ulcers    HLD (hyperlipidemia)    Hypertension    Liver disease    Lymphedema of right lower extremity    Morbid obesity (HCC)    Multiple gastric ulcers    Multiple gastric ulcers    Personal history of COVID-19    PONV (postoperative nausea and vomiting)    Pre-diabetes    Pulmonary embolism ([place])    HEART CATH DONE 12/2020 AND MRI DONE - PULMONARY EMBOLIS - GONE   Renal disorder    kidney  stones and shortened ureter repaired with surgery as a child.   Seizures ([place])    only had with preeclampsia during pregnancy at age 55. none since   Stress incontinence (female) (female)    Vitamin B 12 deficiency    Vitamin D deficiency     Past Surgical History:  Procedure Laterality Date   BREAST SURGERY     reduction   CARPAL TUNNEL RELEASE Bilateral    CHOLECYSTECTOMY     CYSTOSCOPY WITH URETEROSCOPY, STONE BASKETRY AND STENT PLACEMENT  10/09/2016   Procedure: CYSTOSCOPY WITH URETEROSCOPY, STONE BASKETRY AND STENT PLACEMENT;  Surgeon: Hollice Espy, MD;  Location: [facility];  Service: Urology;;   DIRECT LARYNGOSCOPY Left 04/15/2015   Procedure: DIRECT LARYNGOSCOPY BIOPSY AND LASER LEFT TONGUE LESION;  Surgeon: Melissa Montane, MD;  Location: [place];  Service:  ENT;  Laterality: Left;   ESOPHAGOGASTRODUODENOSCOPY (EGD) WITH PROPOFOL N/A 09/03/2019   Procedure: ESOPHAGOGASTRODUODENOSCOPY (EGD) WITH PROPOFOL;  Surgeon: Toledo, Benay Pike, MD;  Location: [facility];  Service: Gastroenterology;  Laterality: N/A;   ESOPHAGOGASTRODUODENOSCOPY (EGD) WITH PROPOFOL N/A 02/03/2021   Procedure: ESOPHAGOGASTRODUODENOSCOPY (EGD) WITH PROPOFOL;  Surgeon: Lesly Rubenstein, MD;  Location: [facility];  Service: Endoscopy;  Laterality: N/A;  ELIQUIS   LEFT HEART CATH     REDUCTION MAMMAPLASTY     RIGHT HEART CATH N/A 06/02/2020   Procedure: RIGHT HEART CATH;  Surgeon: Yolonda Kida, MD;  Location: [place] [facility];  Service: Cardiovascular;  Laterality: N/A;   TONSILLECTOMY     URETER SURGERY     VAGINAL HYSTERECTOMY  2000    MEDICATIONS:  albuterol (PROVENTIL) (2.5 MG/3ML) 0.083% nebulizer solution   apixaban (ELIQUIS) 5 MG TABS tablet   budesonide-formoterol (SYMBICORT) 160-4.5 MCG/ACT inhaler   linaclotide (LINZESS) 72 MCG capsule   meloxicam (MOBIC) 7.5 MG tablet   methylPREDNISolone (MEDROL DOSEPAK) 4 MG TBPK tablet   ondansetron (ZOFRAN) 4 MG tablet   pantoprazole (PROTONIX) 40 MG tablet   SUMAtriptan (IMITREX) 100 MG tablet   valACYclovir (VALTREX) 500 MG tablet   No current facility-administered medications for this encounter.    gadopentetate dimeglumine (MAGNEVIST) injection 20 mL   sodium chloride flush (NS) 0.9 % injection 3 mL     [facility], PA-C [facility]-Surgical [facility] [phone]

## 2021-04-01 LAB — SARS CORONAVIRUS 2 (TAT 6-24 HRS): SARS Coronavirus 2: NEGATIVE

## 2021-04-04 ENCOUNTER — Other Ambulatory Visit: Payer: Self-pay

## 2021-04-04 ENCOUNTER — Encounter (HOSPITAL_COMMUNITY): Payer: Self-pay | Admitting: Surgery

## 2021-04-04 ENCOUNTER — Inpatient Hospital Stay (HOSPITAL_COMMUNITY): Payer: 59 | Admitting: Certified Registered"

## 2021-04-04 ENCOUNTER — Inpatient Hospital Stay (HOSPITAL_COMMUNITY): Payer: 59 | Admitting: Physician Assistant

## 2021-04-04 ENCOUNTER — Inpatient Hospital Stay (HOSPITAL_COMMUNITY)
Admission: RE | Admit: 2021-04-04 | Discharge: 2021-04-05 | DRG: 621 | Disposition: A | Discharge status: Home / Self Care | Payer: 59 | Attending: Surgery | Admitting: Surgery

## 2021-04-04 ENCOUNTER — Encounter (HOSPITAL_COMMUNITY)
Admission: RE | Disposition: A | Discharge status: Home / Self Care | Payer: Self-pay | Source: Home / Self Care | Attending: Surgery

## 2021-04-04 DIAGNOSIS — I129 Hypertensive chronic kidney disease with stage 1 through stage 4 chronic kidney disease, or unspecified chronic kidney disease: Secondary | ICD-10-CM | POA: Diagnosis present

## 2021-04-04 DIAGNOSIS — J45909 Unspecified asthma, uncomplicated: Secondary | ICD-10-CM | POA: Diagnosis not present

## 2021-04-04 DIAGNOSIS — N183 Chronic kidney disease, stage 3 unspecified: Secondary | ICD-10-CM | POA: Diagnosis present

## 2021-04-04 DIAGNOSIS — I2724 Chronic thromboembolic pulmonary hypertension: Secondary | ICD-10-CM | POA: Diagnosis present

## 2021-04-04 DIAGNOSIS — Z23 Encounter for immunization: Secondary | ICD-10-CM | POA: Diagnosis not present

## 2021-04-04 DIAGNOSIS — Z7951 Long term (current) use of inhaled steroids: Secondary | ICD-10-CM | POA: Diagnosis not present

## 2021-04-04 DIAGNOSIS — Z86711 Personal history of pulmonary embolism: Secondary | ICD-10-CM

## 2021-04-04 DIAGNOSIS — Z7722 Contact with and (suspected) exposure to environmental tobacco smoke (acute) (chronic): Secondary | ICD-10-CM | POA: Diagnosis present

## 2021-04-04 DIAGNOSIS — E039 Hypothyroidism, unspecified: Secondary | ICD-10-CM | POA: Diagnosis not present

## 2021-04-04 DIAGNOSIS — Z91030 Bee allergy status: Secondary | ICD-10-CM | POA: Diagnosis not present

## 2021-04-04 DIAGNOSIS — F419 Anxiety disorder, unspecified: Secondary | ICD-10-CM | POA: Diagnosis present

## 2021-04-04 DIAGNOSIS — K760 Fatty (change of) liver, not elsewhere classified: Secondary | ICD-10-CM | POA: Diagnosis not present

## 2021-04-04 DIAGNOSIS — Z8616 Personal history of COVID-19: Secondary | ICD-10-CM

## 2021-04-04 DIAGNOSIS — Z833 Family history of diabetes mellitus: Secondary | ICD-10-CM

## 2021-04-04 DIAGNOSIS — M549 Dorsalgia, unspecified: Secondary | ICD-10-CM | POA: Diagnosis present

## 2021-04-04 DIAGNOSIS — Z79899 Other long term (current) drug therapy: Secondary | ICD-10-CM | POA: Diagnosis not present

## 2021-04-04 DIAGNOSIS — R7303 Prediabetes: Secondary | ICD-10-CM | POA: Diagnosis present

## 2021-04-04 DIAGNOSIS — K219 Gastro-esophageal reflux disease without esophagitis: Secondary | ICD-10-CM | POA: Diagnosis present

## 2021-04-04 DIAGNOSIS — G8929 Other chronic pain: Secondary | ICD-10-CM | POA: Diagnosis present

## 2021-04-04 DIAGNOSIS — E785 Hyperlipidemia, unspecified: Secondary | ICD-10-CM | POA: Diagnosis present

## 2021-04-04 DIAGNOSIS — Z880 Allergy status to penicillin: Secondary | ICD-10-CM | POA: Diagnosis not present

## 2021-04-04 DIAGNOSIS — K5909 Other constipation: Secondary | ICD-10-CM | POA: Diagnosis present

## 2021-04-04 DIAGNOSIS — K449 Diaphragmatic hernia without obstruction or gangrene: Secondary | ICD-10-CM | POA: Diagnosis present

## 2021-04-04 DIAGNOSIS — Z6841 Body Mass Index (BMI) 40.0 and over, adult: Secondary | ICD-10-CM

## 2021-04-04 DIAGNOSIS — F32A Depression, unspecified: Secondary | ICD-10-CM | POA: Diagnosis present

## 2021-04-04 DIAGNOSIS — Z7901 Long term (current) use of anticoagulants: Secondary | ICD-10-CM | POA: Diagnosis not present

## 2021-04-04 DIAGNOSIS — G4730 Sleep apnea, unspecified: Secondary | ICD-10-CM | POA: Diagnosis present

## 2021-04-04 DIAGNOSIS — Z86718 Personal history of other venous thrombosis and embolism: Secondary | ICD-10-CM

## 2021-04-04 HISTORY — PX: UPPER GI ENDOSCOPY: SHX6162

## 2021-04-04 HISTORY — PX: GASTRIC ROUX-EN-Y: SHX5262

## 2021-04-04 LAB — ABO/RH: ABO/RH(D): O NEG

## 2021-04-04 LAB — GLUCOSE, CAPILLARY: Glucose-Capillary: 114 mg/dL — ABNORMAL HIGH (ref 70–99)

## 2021-04-04 SURGERY — LAPAROSCOPIC ROUX-EN-Y GASTRIC BYPASS WITH UPPER ENDOSCOPY
Anesthesia: General | Site: Abdomen

## 2021-04-04 MED ORDER — TRAMADOL HCL 50 MG PO TABS: 50.0000 mg | ORAL_TABLET | Freq: Four times a day (QID) | ORAL | Status: DC | PRN

## 2021-04-04 MED ORDER — EPHEDRINE SULFATE-NACL 50-0.9 MG/10ML-% IV SOSY
PREFILLED_SYRINGE | INTRAVENOUS | Status: DC | PRN
  Administered 2021-04-04: 10 mg via INTRAVENOUS
  Administered 2021-04-04 (×2): 5 mg via INTRAVENOUS

## 2021-04-04 MED ORDER — DEXAMETHASONE SODIUM PHOSPHATE 10 MG/ML IJ SOLN
INTRAMUSCULAR | Status: DC | PRN
  Administered 2021-04-04: 10 mg via INTRAVENOUS

## 2021-04-04 MED ORDER — PROPOFOL 10 MG/ML IV BOLUS
INTRAVENOUS | Status: AC
  Filled 2021-04-04: qty 40

## 2021-04-04 MED ORDER — CHLORHEXIDINE GLUCONATE 4 % EX LIQD: 60.0000 mL | Freq: Once | CUTANEOUS | Status: DC

## 2021-04-04 MED ORDER — FENTANYL CITRATE PF 50 MCG/ML IJ SOSY
25.0000 ug | PREFILLED_SYRINGE | INTRAMUSCULAR | Status: DC | PRN
  Administered 2021-04-04: 50 ug via INTRAVENOUS
  Administered 2021-04-04 (×2): 25 ug via INTRAVENOUS
  Administered 2021-04-04: 50 ug via INTRAVENOUS

## 2021-04-04 MED ORDER — LIDOCAINE 2% (20 MG/ML) 5 ML SYRINGE
INTRAMUSCULAR | Status: AC
  Filled 2021-04-04: qty 5

## 2021-04-04 MED ORDER — INFLUENZA VAC SPLIT QUAD 0.5 ML IM SUSY
0.5000 mL | PREFILLED_SYRINGE | INTRAMUSCULAR | Status: AC
  Administered 2021-04-05: 0.5 mL via INTRAMUSCULAR
  Filled 2021-04-04: qty 0.5

## 2021-04-04 MED ORDER — ACETAMINOPHEN 500 MG PO TABS
1000.0000 mg | ORAL_TABLET | Freq: Three times a day (TID) | ORAL | Status: DC
  Administered 2021-04-04 – 2021-04-05 (×2): 1000 mg via ORAL
  Filled 2021-04-04 (×3): qty 2

## 2021-04-04 MED ORDER — ACETAMINOPHEN 160 MG/5ML PO SOLN
1000.0000 mg | Freq: Three times a day (TID) | ORAL | Status: DC
  Administered 2021-04-04: 1000 mg via ORAL
  Filled 2021-04-04: qty 40.6

## 2021-04-04 MED ORDER — METOPROLOL TARTRATE 5 MG/5ML IV SOLN: 5.0000 mg | Freq: Four times a day (QID) | INTRAVENOUS | Status: DC | PRN

## 2021-04-04 MED ORDER — METOCLOPRAMIDE HCL 5 MG/ML IJ SOLN
10.0000 mg | Freq: Four times a day (QID) | INTRAMUSCULAR | Status: DC
  Administered 2021-04-04: 10 mg via INTRAVENOUS
  Filled 2021-04-04 (×4): qty 2

## 2021-04-04 MED ORDER — "VISTASEAL 4 ML SINGLE DOSE KIT "
4.0000 mL | PACK | Freq: Once | CUTANEOUS | Status: AC
  Administered 2021-04-04: 4 mL via TOPICAL
  Filled 2021-04-04: qty 4

## 2021-04-04 MED ORDER — BUPIVACAINE-EPINEPHRINE 0.25% -1:200000 IJ SOLN
INTRAMUSCULAR | Status: DC | PRN
  Administered 2021-04-04: 30 mL

## 2021-04-04 MED ORDER — SUMATRIPTAN SUCCINATE 50 MG PO TABS
100.0000 mg | ORAL_TABLET | ORAL | Status: DC | PRN
  Filled 2021-04-04: qty 2

## 2021-04-04 MED ORDER — ROCURONIUM BROMIDE 10 MG/ML (PF) SYRINGE
PREFILLED_SYRINGE | INTRAVENOUS | Status: DC | PRN
  Administered 2021-04-04: 40 mg via INTRAVENOUS
  Administered 2021-04-04 (×3): 20 mg via INTRAVENOUS

## 2021-04-04 MED ORDER — ONDANSETRON HCL 4 MG/2ML IJ SOLN
INTRAMUSCULAR | Status: AC
  Filled 2021-04-04: qty 2

## 2021-04-04 MED ORDER — FIBRIN SEALANT 2 ML SINGLE DOSE KIT
2.0000 mL | PACK | Freq: Once | CUTANEOUS | Status: AC
  Administered 2021-04-04: 2 mL via TOPICAL
  Filled 2021-04-04: qty 2

## 2021-04-04 MED ORDER — LACTATED RINGERS IR SOLN
Status: DC | PRN
  Administered 2021-04-04: 1000 mL

## 2021-04-04 MED ORDER — FENTANYL CITRATE PF 50 MCG/ML IJ SOSY
PREFILLED_SYRINGE | INTRAMUSCULAR | Status: AC
  Filled 2021-04-04: qty 1

## 2021-04-04 MED ORDER — MIDAZOLAM HCL 2 MG/2ML IJ SOLN
INTRAMUSCULAR | Status: DC | PRN
  Administered 2021-04-04: 2 mg via INTRAVENOUS

## 2021-04-04 MED ORDER — BUPIVACAINE LIPOSOME 1.3 % IJ SUSP
INTRAMUSCULAR | Status: AC
  Filled 2021-04-04: qty 20

## 2021-04-04 MED ORDER — FENTANYL CITRATE (PF) 100 MCG/2ML IJ SOLN
INTRAMUSCULAR | Status: AC
  Filled 2021-04-04: qty 2

## 2021-04-04 MED ORDER — APREPITANT 40 MG PO CAPS
40.0000 mg | ORAL_CAPSULE | ORAL | Status: AC
  Administered 2021-04-04: 40 mg via ORAL
  Filled 2021-04-04: qty 1

## 2021-04-04 MED ORDER — SUGAMMADEX SODIUM 200 MG/2ML IV SOLN
INTRAVENOUS | Status: DC | PRN
  Administered 2021-04-04: 240 mg via INTRAVENOUS

## 2021-04-04 MED ORDER — ROCURONIUM BROMIDE 10 MG/ML (PF) SYRINGE
PREFILLED_SYRINGE | INTRAVENOUS | Status: AC
  Filled 2021-04-04: qty 10

## 2021-04-04 MED ORDER — HEPARIN SODIUM (PORCINE) 5000 UNIT/ML IJ SOLN
5000.0000 [IU] | INTRAMUSCULAR | Status: AC
  Administered 2021-04-04: 5000 [IU] via SUBCUTANEOUS
  Filled 2021-04-04: qty 1

## 2021-04-04 MED ORDER — HYDRALAZINE HCL 20 MG/ML IJ SOLN: 10.0000 mg | INTRAMUSCULAR | Status: DC | PRN

## 2021-04-04 MED ORDER — PHENYLEPHRINE 40 MCG/ML (10ML) SYRINGE FOR IV PUSH (FOR BLOOD PRESSURE SUPPORT)
PREFILLED_SYRINGE | INTRAVENOUS | Status: DC | PRN
  Administered 2021-04-04 (×4): 40 ug via INTRAVENOUS

## 2021-04-04 MED ORDER — BUPIVACAINE LIPOSOME 1.3 % IJ SUSP
20.0000 mL | Freq: Once | INTRAMUSCULAR | Status: DC
  Filled 2021-04-04: qty 20

## 2021-04-04 MED ORDER — HYDROMORPHONE HCL 1 MG/ML IJ SOLN
0.5000 mg | INTRAMUSCULAR | Status: DC | PRN
Start: 2021-04-04 — End: 2021-04-05

## 2021-04-04 MED ORDER — ENSURE MAX PROTEIN PO LIQD
2.0000 [oz_av] | ORAL | Status: DC
  Administered 2021-04-05 (×3): 2 [oz_av] via ORAL

## 2021-04-04 MED ORDER — SCOPOLAMINE 1 MG/3DAYS TD PT72
1.0000 | MEDICATED_PATCH | TRANSDERMAL | Status: DC
  Administered 2021-04-04: 1.5 mg via TRANSDERMAL
  Filled 2021-04-04: qty 1

## 2021-04-04 MED ORDER — ONDANSETRON HCL 4 MG/2ML IJ SOLN: 4.0000 mg | INTRAMUSCULAR | Status: DC | PRN

## 2021-04-04 MED ORDER — SODIUM CHLORIDE 0.9 % IV SOLN
2.0000 g | INTRAVENOUS | Status: AC
  Administered 2021-04-04: 2 g via INTRAVENOUS
  Filled 2021-04-04: qty 2

## 2021-04-04 MED ORDER — ACETAMINOPHEN 500 MG PO TABS
1000.0000 mg | ORAL_TABLET | ORAL | Status: AC
  Administered 2021-04-04: 1000 mg via ORAL
  Filled 2021-04-04: qty 2

## 2021-04-04 MED ORDER — SIMETHICONE 80 MG PO CHEW
80.0000 mg | CHEWABLE_TABLET | Freq: Four times a day (QID) | ORAL | Status: DC | PRN
  Administered 2021-04-04 (×2): 80 mg via ORAL
  Filled 2021-04-04 (×2): qty 1

## 2021-04-04 MED ORDER — 0.9 % SODIUM CHLORIDE (POUR BTL) OPTIME
TOPICAL | Status: DC | PRN
  Administered 2021-04-04: 1000 mL

## 2021-04-04 MED ORDER — PANTOPRAZOLE SODIUM 40 MG IV SOLR
40.0000 mg | Freq: Every day | INTRAVENOUS | Status: DC
  Administered 2021-04-04: 40 mg via INTRAVENOUS
  Filled 2021-04-04: qty 40

## 2021-04-04 MED ORDER — SODIUM CHLORIDE 0.9 % IV SOLN: INTRAVENOUS | Status: DC

## 2021-04-04 MED ORDER — LIDOCAINE 2% (20 MG/ML) 5 ML SYRINGE
INTRAMUSCULAR | Status: DC | PRN
  Administered 2021-04-04: 1.5 mg/kg/h via INTRAVENOUS
  Administered 2021-04-04: 100 mg via INTRAVENOUS

## 2021-04-04 MED ORDER — BUPIVACAINE LIPOSOME 1.3 % IJ SUSP
INTRAMUSCULAR | Status: DC | PRN
  Administered 2021-04-04: 20 mL

## 2021-04-04 MED ORDER — PROPOFOL 10 MG/ML IV BOLUS
INTRAVENOUS | Status: DC | PRN
  Administered 2021-04-04: 200 mg via INTRAVENOUS

## 2021-04-04 MED ORDER — BUPIVACAINE-EPINEPHRINE (PF) 0.25% -1:200000 IJ SOLN
INTRAMUSCULAR | Status: AC
  Filled 2021-04-04: qty 30

## 2021-04-04 MED ORDER — DOCUSATE SODIUM 100 MG PO CAPS
100.0000 mg | ORAL_CAPSULE | Freq: Two times a day (BID) | ORAL | Status: DC
  Administered 2021-04-04 – 2021-04-05 (×3): 100 mg via ORAL
  Filled 2021-04-04 (×3): qty 1

## 2021-04-04 MED ORDER — MIDAZOLAM HCL 2 MG/2ML IJ SOLN
INTRAMUSCULAR | Status: AC
  Filled 2021-04-04: qty 2

## 2021-04-04 MED ORDER — KETOROLAC TROMETHAMINE 15 MG/ML IJ SOLN
15.0000 mg | Freq: Three times a day (TID) | INTRAMUSCULAR | Status: DC | PRN
  Administered 2021-04-04 – 2021-04-05 (×2): 15 mg via INTRAVENOUS
  Filled 2021-04-04 (×2): qty 1

## 2021-04-04 MED ORDER — SUCCINYLCHOLINE CHLORIDE 200 MG/10ML IV SOSY
PREFILLED_SYRINGE | INTRAVENOUS | Status: AC
  Filled 2021-04-04: qty 10

## 2021-04-04 MED ORDER — LIDOCAINE 2% (20 MG/ML) 5 ML SYRINGE
INTRAMUSCULAR | Status: AC
  Filled 2021-04-04: qty 10

## 2021-04-04 MED ORDER — FENTANYL CITRATE (PF) 250 MCG/5ML IJ SOLN
INTRAMUSCULAR | Status: DC | PRN
  Administered 2021-04-04: 100 ug via INTRAVENOUS
  Administered 2021-04-04 (×2): 50 ug via INTRAVENOUS

## 2021-04-04 MED ORDER — METHOCARBAMOL 500 MG IVPB - SIMPLE MED
500.0000 mg | Freq: Four times a day (QID) | INTRAVENOUS | Status: DC | PRN
  Filled 2021-04-04: qty 50

## 2021-04-04 MED ORDER — STERILE WATER FOR IRRIGATION IR SOLN
Status: DC | PRN
  Administered 2021-04-04: 2000 mL

## 2021-04-04 MED ORDER — GABAPENTIN 300 MG PO CAPS
300.0000 mg | ORAL_CAPSULE | ORAL | Status: AC
  Administered 2021-04-04: 300 mg via ORAL
  Filled 2021-04-04: qty 1

## 2021-04-04 MED ORDER — DEXAMETHASONE SODIUM PHOSPHATE 10 MG/ML IJ SOLN
INTRAMUSCULAR | Status: AC
  Filled 2021-04-04: qty 1

## 2021-04-04 MED ORDER — OXYCODONE HCL 5 MG/5ML PO SOLN: 5.0000 mg | Freq: Four times a day (QID) | ORAL | Status: DC | PRN

## 2021-04-04 MED ORDER — ENOXAPARIN SODIUM 30 MG/0.3ML IJ SOSY
30.0000 mg | PREFILLED_SYRINGE | Freq: Two times a day (BID) | INTRAMUSCULAR | Status: DC
  Administered 2021-04-04 – 2021-04-05 (×2): 30 mg via SUBCUTANEOUS
  Filled 2021-04-04 (×2): qty 0.3

## 2021-04-04 MED ORDER — SUCCINYLCHOLINE CHLORIDE 200 MG/10ML IV SOSY
PREFILLED_SYRINGE | INTRAVENOUS | Status: DC | PRN
  Administered 2021-04-04: 120 mg via INTRAVENOUS

## 2021-04-04 MED ORDER — GABAPENTIN 100 MG PO CAPS
200.0000 mg | ORAL_CAPSULE | Freq: Two times a day (BID) | ORAL | Status: DC
  Administered 2021-04-04 – 2021-04-05 (×2): 200 mg via ORAL
  Filled 2021-04-04 (×2): qty 2

## 2021-04-04 MED ORDER — LACTATED RINGERS IV SOLN: INTRAVENOUS | Status: DC | PRN

## 2021-04-04 SURGICAL SUPPLY — 69 items
APPLICATOR COTTON TIP 6 STRL (MISCELLANEOUS) IMPLANT
APPLICATOR COTTON TIP 6IN STRL (MISCELLANEOUS)
APPLICATOR VISTASEAL 35 (MISCELLANEOUS) ×3 IMPLANT
APPLIER CLIP ROT 13.4 12 LRG (CLIP)
BAG COUNTER SPONGE SURGICOUNT (BAG) IMPLANT
BENZOIN TINCTURE PRP APPL 2/3 (GAUZE/BANDAGES/DRESSINGS) ×3 IMPLANT
BLADE SURG SZ11 CARB STEEL (BLADE) ×3 IMPLANT
BNDG ADH 1X3 SHEER STRL LF (GAUZE/BANDAGES/DRESSINGS) ×18 IMPLANT
CABLE HIGH FREQUENCY MONO STRZ (ELECTRODE) IMPLANT
CHLORAPREP W/TINT 26 (MISCELLANEOUS) ×6 IMPLANT
CLIP APPLIE ROT 13.4 12 LRG (CLIP) IMPLANT
CLIP SUT LAPRA TY ABSORB (SUTURE) ×6 IMPLANT
CLSR STERI-STRIP ANTIMIC 1/2X4 (GAUZE/BANDAGES/DRESSINGS) ×3 IMPLANT
COVER SURGICAL LIGHT HANDLE (MISCELLANEOUS) ×3 IMPLANT
DEVICE SUT QUICK LOAD TK 5 (STAPLE) ×3 IMPLANT
DEVICE SUT TI-KNOT TK 5X26 (MISCELLANEOUS) ×3 IMPLANT
DEVICE SUTURE ENDOST 10MM (ENDOMECHANICALS) ×3 IMPLANT
DRAIN PENROSE 0.25X18 (DRAIN) ×3 IMPLANT
ELECT REM PT RETURN 15FT ADLT (MISCELLANEOUS) ×3 IMPLANT
GAUZE 4X4 16PLY ~~LOC~~+RFID DBL (SPONGE) ×3 IMPLANT
GAUZE SPONGE 4X4 12PLY STRL (GAUZE/BANDAGES/DRESSINGS) IMPLANT
GLOVE SURG ENC MOIS LTX SZ6 (GLOVE) ×3 IMPLANT
GLOVE SURG MICRO LTX SZ6 (GLOVE) ×3 IMPLANT
GLOVE SURG UNDER LTX SZ6.5 (GLOVE) ×3 IMPLANT
GOWN STRL REUS W/TWL LRG LVL3 (GOWN DISPOSABLE) ×3 IMPLANT
GOWN STRL REUS W/TWL XL LVL3 (GOWN DISPOSABLE) ×9 IMPLANT
KIT BASIN OR (CUSTOM PROCEDURE TRAY) ×3 IMPLANT
KIT GASTRIC LAVAGE 34FR ADT (SET/KITS/TRAYS/PACK) ×3 IMPLANT
KIT TURNOVER KIT A (KITS) ×3 IMPLANT
MARKER SKIN DUAL TIP RULER LAB (MISCELLANEOUS) ×3 IMPLANT
MAT PREVALON FULL STRYKER (MISCELLANEOUS) ×3 IMPLANT
NEEDLE SPNL 22GX3.5 QUINCKE BK (NEEDLE) ×3 IMPLANT
PACK CARDIOVASCULAR III (CUSTOM PROCEDURE TRAY) ×3 IMPLANT
PENCIL SMOKE EVACUATOR (MISCELLANEOUS) IMPLANT
RELOAD ENDO STITCH 2.0 (ENDOMECHANICALS) ×27
RELOAD STAPLER BLUE 60MM (STAPLE) ×8 IMPLANT
RELOAD STAPLER GOLD 60MM (STAPLE) IMPLANT
RELOAD STAPLER GREEN 60MM (STAPLE) ×2 IMPLANT
RELOAD STAPLER WHITE 60MM (STAPLE) ×4 IMPLANT
SCISSORS LAP 5X45 EPIX DISP (ENDOMECHANICALS) ×3 IMPLANT
SET IRRIG TUBING LAPAROSCOPIC (IRRIGATION / IRRIGATOR) ×3 IMPLANT
SET TUBE SMOKE EVAC HIGH FLOW (TUBING) ×3 IMPLANT
SHEARS HARMONIC ACE PLUS 45CM (MISCELLANEOUS) ×3 IMPLANT
SLEEVE XCEL OPT CAN 5 100 (ENDOMECHANICALS) ×6 IMPLANT
SOL ANTI FOG 6CC (MISCELLANEOUS) ×2 IMPLANT
SOLUTION ANTI FOG 6CC (MISCELLANEOUS) ×1
STAPLER ECHELON BIOABSB 60 FLE (MISCELLANEOUS) IMPLANT
STAPLER ECHELON LONG 60 440 (INSTRUMENTS) ×3 IMPLANT
STAPLER RELOAD BLUE 60MM (STAPLE) ×12
STAPLER RELOAD GOLD 60MM (STAPLE)
STAPLER RELOAD GREEN 60MM (STAPLE) ×3
STAPLER RELOAD WHITE 60MM (STAPLE) ×6
SUT MNCRL AB 4-0 PS2 18 (SUTURE) ×3 IMPLANT
SUT RELOAD ENDO STITCH 2 48X1 (ENDOMECHANICALS) ×10
SUT RELOAD ENDO STITCH 2.0 (ENDOMECHANICALS) ×8
SUT SURGIDAC NAB ES-9 0 48 120 (SUTURE) IMPLANT
SUT VIC AB 2-0 SH 27 (SUTURE) ×3
SUT VIC AB 2-0 SH 27X BRD (SUTURE) ×2 IMPLANT
SUTURE RELOAD END STTCH 2 48X1 (ENDOMECHANICALS) ×10 IMPLANT
SUTURE RELOAD ENDO STITCH 2.0 (ENDOMECHANICALS) ×8 IMPLANT
SYR 10ML ECCENTRIC (SYRINGE) ×3 IMPLANT
SYR 20ML LL LF (SYRINGE) ×6 IMPLANT
TOWEL OR 17X26 10 PK STRL BLUE (TOWEL DISPOSABLE) ×3 IMPLANT
TOWEL OR NON WOVEN STRL DISP B (DISPOSABLE) ×3 IMPLANT
TRAY FOLEY MTR SLVR 16FR STAT (SET/KITS/TRAYS/PACK) ×3 IMPLANT
TROCAR BLADELESS OPT 5 100 (ENDOMECHANICALS) ×3 IMPLANT
TROCAR UNIVERSAL OPT 12M 100M (ENDOMECHANICALS) ×3 IMPLANT
TROCAR XCEL 12X100 BLDLESS (ENDOMECHANICALS) ×3 IMPLANT
TUBING CONNECTING 10 (TUBING) IMPLANT

## 2021-04-04 NOTE — Progress Notes (Signed)
PHARMACY CONSULT FOR:  Risk Assessment for Post-Discharge VTE Following Bariatric Surgery  Post-Discharge VTE Risk Assessment: This patient's probability of 30-day post-discharge VTE is increased due to the factors marked:   Female    Age >/=60 years    BMI >/=50 kg/m2    CHF    Dyspnea at Rest    Paraplegia  X Non-gastric-band surgery    Operation Time >/=3 hr    Return to OR     Length of Stay >/= 3 d     X Hx of VTE   Hypercoagulable condition   Significant venous stasis   Predicted probability of 30-day post-discharge VTE: n/a  Other patient-specific factors to consider: Pt previously on Eliquis for Hx PE   Recommendation for Discharge: Resume full-dose anticoagulation as soon as medically safe per surgery MD assessment.   Given limited published data on reliability of DOAC absorption in this setting, will await MD decision on whether to use enoxaparin vs resume DOAC  Tammy Young is a 55 y.o. female who underwent laparoscopic Roux-en-Y gastric bypass on 9/13   Case start: 0746 Case end: 1000   Allergies  Allergen Reactions   Bee Venom     " Swelling "    Amoxicillin Rash   Augmentin [Amoxicillin-Pot Clavulanate] Itching and Rash    Patient Measurements: Weight: 116.8 kg (257 lb 6.4 oz) Body mass index is 42.83 kg/m.  No results for input(s): WBC, HGB, HCT, PLT, APTT, CREATININE, LABCREA, CREATININE, CREAT24HRUR, MG, PHOS, ALBUMIN, PROT, ALBUMIN, AST, ALT, ALKPHOS, BILITOT, BILIDIR, IBILI in the last 72 hours. Estimated Creatinine Clearance: 102.7 mL/min (by C-G formula based on SCr of 0.74 mg/dL).    Past Medical History:  Diagnosis Date   Anxiety and depression    Arthritis    Chronic constipation    Chronic cough    Chronic kidney disease (CKD)    stage 3   Collagen vascular disease (HCC)    RA   CTEPH (chronic thromboembolic pulmonary hypertension) (HCC)    Fatty liver disease, nonalcoholic    GERD (gastroesophageal reflux disease)    Headache     Hemorrhoids    Herpes simplex vulvovaginitis    Hiatal hernia    History of kidney stones    History of stomach ulcers    HLD (hyperlipidemia)    Hypertension    Liver disease    Lymphedema of right lower extremity    Morbid obesity (HCC)    Multiple gastric ulcers    Multiple gastric ulcers    Personal history of COVID-19    PONV (postoperative nausea and vomiting)    Pre-diabetes    Pulmonary embolism ([place])    HEART CATH DONE 12/2020 AND MRI DONE - PULMONARY EMBOLIS - GONE   Renal disorder    kidney stones and shortened ureter repaired with surgery as a child.   Seizures ([place])    only had with preeclampsia during pregnancy at age 70. none since   Stress incontinence (female) (female)    Vitamin B 12 deficiency    Vitamin D deficiency      Medications Prior to Admission  Medication Sig Dispense Refill Last Dose   albuterol (PROVENTIL) (2.5 MG/3ML) 0.083% nebulizer solution Inhale 2.5 mg into the lungs every 4 (four) hours as needed for wheezing or shortness of breath.       apixaban (ELIQUIS) 5 MG TABS tablet Take 1 tablet (5 mg total) by mouth 2 (two) times daily. 60 tablet 1 03/30/2021 at 0600  budesonide-formoterol (SYMBICORT) 160-4.5 MCG/ACT inhaler Inhale 2 puffs into the lungs in the morning and at bedtime.    04/03/2021   pantoprazole (PROTONIX) 40 MG tablet Take 40 mg by mouth 2 (two) times daily.    04/04/2021   linaclotide (LINZESS) 72 MCG capsule Take 72 mcg by mouth daily as needed (constipation).   More than a month   SUMAtriptan (IMITREX) 100 MG tablet Take 100 mg by mouth every 2 (two) hours as needed for migraine.   More than a month   valACYclovir (VALTREX) 500 MG tablet Take 500 mg by mouth 2 (two) times daily as needed (outbreak).    More than a month      Reuel Boom, PharmD, BCPS [phone] 04/04/2021, 1:23 PM

## 2021-04-04 NOTE — Transfer of Care (Signed)
Immediate Anesthesia Transfer of Care Note  Patient: Tammy Young  Procedure(s) Performed: LAPAROSCOPIC ROUX-EN-Y GASTRIC BYPASS WITH UPPER ENDOSCOPY (Abdomen) UPPER GI ENDOSCOPY  Patient Location: PACU  Anesthesia Type:General  Level of Consciousness: awake, alert  and patient cooperative  Airway & Oxygen Therapy: Patient Spontanous Breathing and Patient connected to face mask oxygen  Post-op Assessment: Report given to RN and Post -op Vital signs reviewed and stable  Post vital signs: Reviewed and stable  Last Vitals:  Vitals Value Taken Time  BP 139/67 04/04/21 1010  Temp    Pulse 73 04/04/21 1011  Resp 20 04/04/21 1011  SpO2 100 % 04/04/21 1011  Vitals shown include unvalidated device data.  Last Pain:  Vitals:   04/04/21 0555  TempSrc:   PainSc: 3          Complications: No notable events documented.

## 2021-04-04 NOTE — Anesthesia Postprocedure Evaluation (Signed)
Anesthesia Post Note  Patient: Tammy Young  Procedure(s) Performed: LAPAROSCOPIC ROUX-EN-Y GASTRIC BYPASS WITH UPPER ENDOSCOPY (Abdomen) UPPER GI ENDOSCOPY     Patient location during evaluation: PACU Anesthesia Type: General Level of consciousness: awake Pain management: pain level controlled Vital Signs Assessment: post-procedure vital signs reviewed and stable Respiratory status: spontaneous breathing Cardiovascular status: stable Postop Assessment: no apparent nausea or vomiting Anesthetic complications: no   No notable events documented.  Last Vitals:  Vitals:   04/04/21 1100 04/04/21 1115  BP: (!) 151/98 (!) 141/85  Pulse: 72 80  Resp: 14 17  Temp:    SpO2: 96% 100%    Last Pain:  Vitals:   04/04/21 1115  TempSrc:   PainSc: Asleep                 Kellene Mccleary

## 2021-04-04 NOTE — Interval H&P Note (Signed)
History and Physical Interval Note:  04/04/2021 6:54 AM  Tammy Young  has presented today for surgery, with the diagnosis of morbid obesity.  The various methods of treatment have been discussed with the patient and family. After consideration of risks, benefits and other options for treatment, the patient has consented to  Procedure(s): LAPAROSCOPIC ROUX-EN-Y GASTRIC BYPASS WITH UPPER ENDOSCOPY (N/A) UPPER GI ENDOSCOPY (N/A) as a surgical intervention.  The patient's history has been reviewed, patient examined, no change in status, stable for surgery.  I have reviewed the patient's chart and labs.  Questions were answered to the patient's satisfaction.     Kamilo Och Rich Brave

## 2021-04-04 NOTE — Op Note (Signed)
Operative Note  Tammy Young  [id number]  [id number]  04/04/2021   Surgeon: Romana Juniper MD   Assistant: Louanna Raw MD   Procedure performed: laparoscopic Roux-en-Y gastric bypass (antecolic, antegastric), laparoscopic lysis of adhesions x15 minutes, hiatal hernia repair, upper endoscopy   Preop diagnosis: Morbid obesity Body mass index is 42.83 kg/m.,  GERD, chronic back pain, history of pulmonary emboli, nonalcoholic steatohepatosis, Chronic constipation, hyperlipidemia, kidney stones, arthritis, depression and anxiety, hypothyroid, hypertension, hyperlipidemia, possibly sleep apnea, and vitamin B12 and vitamin D deficiency, prediabetes, stress incontinence Post-op diagnosis/intraop findings: Omental adhesions in the left lower quadrant and midline, dense adhesions of small bowel in the left lower quadrant which were left in situ, small sliding hiatal hernia   Specimens: none Retained items: none  EBL: minimal cc Complications: none   Description of procedure: After obtaining informed consent and administration of prophylactic heparin in holding, the patient was taken to the operating room and placed supine on operating room table where general endotracheal anesthesia was initiated, preoperative antibiotics were administered, SCDs applied, and a formal timeout was performed.  Foley catheter was inserted which is removed at the end of the case.  The abdomen was prepped and draped in usual sterile fashion. Peritoneal access was gained using a Visiport technique in the left upper quadrant and insufflation to 15 mmHg ensued without issue. Gross inspection revealed no evidence of injury. Under direct visualization the remaining trochars were inserted. A laparoscopic assisted bilateral taps block was performed using Exparel mixed with Marcaine.   There were omental adhesions to the midline left lower quadrant which were taken down with the harmonic scalpel and blunt dissection.  We then  noted dense adhesions of small bowel in the left lower quadrant but these are quite distal and were left in situ.  The omentum was divided in the region of the mid transverse colon leaving some of the remaining tethered omentum along the lower abdomen The ligament of Treitz was identified. The small bowel was followed to a point 50 cm distal to ligament of Treitz at which location the bowel was divided with a white load linear cutting stapler. A Penrose was sutured to the Roux side of the staple line for future identification. The bowel was measured another 100 cm distal to this and and the site for the jejunojejunostomy was aligned with the end of the biliopancreatic limb. Enterotomies were made with the Harmonic scalpel and the anastomosis was created with the 60 mm white load linear cutting stapler. The common enterotomy was closed with running 3-0 Vicryl starting on either end and tying centrally. The mesenteric defect was closed with running silk suture secured with Lapra-Ty's. The anastomosis was inspected and appeared widely patent, hemostatic with no gaps in the suture line.  Vistaseal was injected over the anastomosis.  The patient was then placed in steep reverse Trendelenburg. The liver retractor was inserted through a subxiphoid incision and secured for fixed retraction of the left lobe.  Small hiatal hernia was confirmed on direct inspection.  The pars lucida was entered and then the crura dissected out posteriorly with a combination of blunt dissection and harmonic scalpel.  The hernia was repaired with a simple interrupted 0 Ethibond secured with a ty-knot. The Harmonic scalpel was then used to enter the perigastric plane along the lesser sac at a point 5 cm distal to the GE junction on the lesser curve. The angle of His was gently bluntly dissected in the target shape of the pouch visualized to  exclude any residual fundus. After confirming that all tubes have been removed from the stomach, the  gastric pouch was created with serial fires of the linear cutting stapler. As we approached the angle of His, the Ewald tube was inserted to confirm no impingement on the GE junction.  The Roux limb with its attached Penrose drain was then identified and brought up to meet the gastric pouch ensuring no twist in the small bowel mesentery. The staple line of the small bowel is directed to the patient's left side. A running 3-0 Vicryl was used to create a posterior suture line for our anastomosis between the gastric pouch and the small bowel. Gastrotomy and enterotomy was made with the Harmonic scalpel and a blue load linear cutting stapler was used to create a gastrojejunal anastomosis approximately 3cm wide. The common enterotomy was closed with running 3-0 Vicryl starting at either end and tying centrally. At this juncture the Ewald tube was passed through the gastrojejunal anastomosis. An anterior layer of running 3-0 Vicryl was used to complete the gastrojejunal anastomosis. The ewald tube was removed without difficulty. The [place] space was closed with a figure-of-eight silk suture.  At this point the assistant performed an upper endoscopy with the Roux limb gently clamped with a bowel clamp. Irrigation is instilled in the upper abdomen for a leak test. Please see a separate operative note- the anastomosis is noted to be patent and hemostatic without any leak or bubbles present. The endoscope was removed and the abdomen once again surveyed.  Additional Vistaseal was sprayed over the gastrojejunostomy.  The liver retractor was removed under direct visualization. The abdomen was then desufflated and all remaining trochars removed. The skin incisions were closed with subcuticular 4-0 Monocryl; benzoin, Steri-Strips and Band-Aids were applied The patient was then awakened, extubated and taken to PACU in stable condition.     All counts were correct at the completion of the case.

## 2021-04-04 NOTE — Plan of Care (Signed)
Pt complains of trapped gas, currently walking in her room and sitting in chair. Pt demonstrated correct use of IS reaching up to 1000. Patient drank 60 ml of water.

## 2021-04-04 NOTE — Anesthesia Procedure Notes (Signed)
Procedure Name: Intubation Date/Time: 04/04/2021 7:21 AM Performed by: Eben Burow, CRNA Pre-anesthesia Checklist: Patient identified, Emergency Drugs available, Suction available, Patient being monitored and Timeout performed Patient Re-evaluated:Patient Re-evaluated prior to induction Oxygen Delivery Method: Circle system utilized Preoxygenation: Pre-oxygenation with 100% oxygen Induction Type: IV induction, Rapid sequence and Cricoid Pressure applied Laryngoscope Size: Mac and 4 Grade View: Grade I Tube type: Oral Tube size: 7.0 mm Number of attempts: 1 Airway Equipment and Method: Stylet Placement Confirmation: ETT inserted through vocal cords under direct vision, positive ETCO2 and breath sounds checked- equal and bilateral Secured at: 21 cm Tube secured with: Tape Dental Injury: Teeth and Oropharynx as per pre-operative assessment

## 2021-04-04 NOTE — Progress Notes (Signed)

## 2021-04-05 ENCOUNTER — Encounter (HOSPITAL_COMMUNITY): Payer: Self-pay | Admitting: Surgery

## 2021-04-05 ENCOUNTER — Other Ambulatory Visit (HOSPITAL_COMMUNITY): Payer: Self-pay

## 2021-04-05 LAB — CBC WITH DIFFERENTIAL/PLATELET
Abs Immature Granulocytes: 0.03 10*3/uL (ref 0.00–0.07)
Basophils Absolute: 0 10*3/uL (ref 0.0–0.1)
Basophils Relative: 0 %
Eosinophils Absolute: 0 10*3/uL (ref 0.0–0.5)
Eosinophils Relative: 0 %
HCT: 36.6 % (ref 36.0–46.0)
Hemoglobin: 12.2 g/dL (ref 12.0–15.0)
Immature Granulocytes: 1 %
Lymphocytes Relative: 26 %
Lymphs Abs: 1.6 10*3/uL (ref 0.7–4.0)
MCH: 30.9 pg (ref 26.0–34.0)
MCHC: 33.3 g/dL (ref 30.0–36.0)
MCV: 92.7 fL (ref 80.0–100.0)
Monocytes Absolute: 0.4 10*3/uL (ref 0.1–1.0)
Monocytes Relative: 7 %
Neutro Abs: 4.1 10*3/uL (ref 1.7–7.7)
Neutrophils Relative %: 66 %
Platelets: DECREASED 10*3/uL (ref 150–400)
RBC: 3.95 MIL/uL (ref 3.87–5.11)
RDW: 12.6 % (ref 11.5–15.5)
WBC: 6.1 10*3/uL (ref 4.0–10.5)
nRBC: 0 % (ref 0.0–0.2)

## 2021-04-05 LAB — COMPREHENSIVE METABOLIC PANEL
ALT: 54 U/L — ABNORMAL HIGH (ref 0–44)
AST: 56 U/L — ABNORMAL HIGH (ref 15–41)
Albumin: 3.7 g/dL (ref 3.5–5.0)
Alkaline Phosphatase: 51 U/L (ref 38–126)
Anion gap: 8 (ref 5–15)
BUN: 13 mg/dL (ref 6–20)
CO2: 20 mmol/L — ABNORMAL LOW (ref 22–32)
Calcium: 9.1 mg/dL (ref 8.9–10.3)
Chloride: 111 mmol/L (ref 98–111)
Creatinine, Ser: 0.68 mg/dL (ref 0.44–1.00)
GFR, Estimated: >60 mL/min (ref >60)
Glucose, Bld: 140 mg/dL — ABNORMAL HIGH (ref 70–99)
Potassium: 4.4 mmol/L (ref 3.5–5.1)
Sodium: 139 mmol/L (ref 135–145)
Total Bilirubin: 0.4 mg/dL (ref 0.3–1.2)
Total Protein: 6.9 g/dL (ref 6.5–8.1)

## 2021-04-05 LAB — MAGNESIUM: Magnesium: 2.3 mg/dL (ref 1.7–2.4)

## 2021-04-05 MED ORDER — ACETAMINOPHEN 500 MG PO TABS: 1000.0000 mg | ORAL_TABLET | Freq: Three times a day (TID) | ORAL | 0 refills | Status: AC

## 2021-04-05 MED ORDER — TRAMADOL HCL 50 MG PO TABS
50.0000 mg | ORAL_TABLET | Freq: Four times a day (QID) | ORAL | 0 refills | Status: DC | PRN
  Filled 2021-04-05: qty 10, 3d supply, fill #0

## 2021-04-05 MED ORDER — GABAPENTIN 100 MG PO CAPS
200.0000 mg | ORAL_CAPSULE | Freq: Two times a day (BID) | ORAL | 0 refills | Status: DC
  Filled 2021-04-05: qty 20, 5d supply, fill #0

## 2021-04-05 MED ORDER — ONDANSETRON 4 MG PO TBDP
4.0000 mg | ORAL_TABLET | Freq: Four times a day (QID) | ORAL | 0 refills | Status: DC | PRN
  Filled 2021-04-05: qty 20, 5d supply, fill #0

## 2021-04-05 MED ORDER — PANTOPRAZOLE SODIUM 40 MG PO TBEC
40.0000 mg | DELAYED_RELEASE_TABLET | Freq: Every day | ORAL | 0 refills | Status: DC
  Filled 2021-04-05: qty 90, 90d supply, fill #0

## 2021-04-05 NOTE — Discharge Summary (Signed)
Physician Discharge Summary  Tammy Young [id number] DOB: [birth year] DOA: 04/04/2021  PCP: Toni Arthurs, NP  Admit date: 04/04/2021 Discharge date: 04/05/2021  Recommendations for Outpatient Follow-up:     Follow-up Information     Clovis Riley, MD. Go on 04/26/2021.   Specialty: General Surgery Why: at 11:30am.  Please arrive 15 minutes prior to your appointment time.  Thank you. Contact information: [address] [place] [address] [phone]         Surgery, [place]. Go on 05/25/2021.   Specialty: General Surgery Why: at 11:30am with Dr. Kae Heller.  Please arrive 15 minutes prior to your appointment time.  Thank you. Contact information: [address] [place] [address] [phone]                Discharge Diagnoses:  Active Problems:   Morbid obesity ([place])   Surgical Procedure: Laparoscopic Roux-en-Y gastric bypass, upper endoscopy  Discharge Condition: Good Disposition: Home  Diet recommendation: Postoperative gastric bypass diet  Filed Weights   04/04/21 0518 04/04/21 1306  Weight: 116.8 kg 116 kg     Hospital Course:  The patient was admitted for a planned laparoscopic Roux-en-Y gastric bypass. Please see operative note. Preoperatively the patient was given 5000 units of subcutaneous heparin for DVT prophylaxis. ERAS protocol was used. Postoperative prophylactic Lovenox dosing was started on the evening of postoperative day 0.  The patient was started on ice chips and water on the evening of POD 0 which they tolerated. On postoperative day 1 The patient's diet was advanced to protein shakes which they also tolerated. The patient was ambulating without difficulty. Their vital signs are stable without fever or tachycardia. Their hemoglobin had remained stable. The patient had received discharge instructions and counseling. They were deemed stable for discharge.  BP (!) 152/80 (BP Location: Right  Arm)   Pulse 62   Temp 97.7 F (36.5 C) (Oral)   Resp 18   Ht '5\' 5"'$  (1.651 m)   Wt 116 kg   SpO2 97%   BMI 42.56 kg/m  See rounding note  Discharge Instructions   Allergies as of 04/05/2021       Reactions   Bee Venom    " Swelling "    Amoxicillin Rash   Augmentin [amoxicillin-pot Clavulanate] Itching, Rash        Medication List     TAKE these medications    acetaminophen 500 MG tablet Commonly known as: TYLENOL Take 2 tablets (1,000 mg total) by mouth every 8 (eight) hours for 5 days.   albuterol (2.5 MG/3ML) 0.083% nebulizer solution Commonly known as: PROVENTIL Inhale 2.5 mg into the lungs every 4 (four) hours as needed for wheezing or shortness of breath.   apixaban 5 MG Tabs tablet Commonly known as: ELIQUIS Take 1 tablet (5 mg total) by mouth 2 (two) times daily.   budesonide-formoterol 160-4.5 MCG/ACT inhaler Commonly known as: SYMBICORT Inhale 2 puffs into the lungs in the morning and at bedtime.   gabapentin 100 MG capsule Commonly known as: NEURONTIN Take 2 capsules (200 mg total) by mouth every 12 (twelve) hours.   linaclotide 72 MCG capsule Commonly known as: LINZESS Take 72 mcg by mouth daily as needed (constipation).   ondansetron 4 MG disintegrating tablet Commonly known as: ZOFRAN-ODT Dissolve 1 tablet (4 mg total) by mouth every 6 (six) hours as needed for nausea or vomiting.   pantoprazole 40 MG tablet Commonly known as: PROTONIX Take 40  mg by mouth 2 (two) times daily. What changed: Another medication with the same name was added. Make sure you understand how and when to take each.   pantoprazole 40 MG tablet Commonly known as: PROTONIX Take 1 tablet (40 mg total) by mouth daily. What changed: You were already taking a medication with the same name, and this prescription was added. Make sure you understand how and when to take each.   SUMAtriptan 100 MG tablet Commonly known as: IMITREX Take 100 mg by mouth every 2 (two) hours  as needed for migraine.   traMADol 50 MG tablet Commonly known as: ULTRAM Take 1 tablet (50 mg total) by mouth every 6 (six) hours as needed (pain).   valACYclovir 500 MG tablet Commonly known as: VALTREX Take 500 mg by mouth 2 (two) times daily as needed (outbreak).        Follow-up Information     Clovis Riley, MD. Go on 04/26/2021.   Specialty: General Surgery Why: at 11:30am.  Please arrive 15 minutes prior to your appointment time.  Thank you. Contact information: [address] [place] [address] [phone]         Surgery, [place]. Go on 05/25/2021.   Specialty: General Surgery Why: at 11:30am with Dr. Kae Heller.  Please arrive 15 minutes prior to your appointment time.  Thank you. Contact information: [place] [address] [phone]                  The results of significant diagnostics from this hospitalization (including imaging, microbiology, ancillary and laboratory) are listed below for reference.    Significant Diagnostic Studies: No results found.  Labs: Basic Metabolic Panel: Recent Labs  Lab 04/05/21 0409  NA 139  K 4.4  CL 111  CO2 20*  GLUCOSE 140*  BUN 13  CREATININE 0.68  CALCIUM 9.1  MG 2.3   Liver Function Tests: Recent Labs  Lab 04/05/21 0409  AST 56*  ALT 54*  ALKPHOS 51  BILITOT 0.4  PROT 6.9  ALBUMIN 3.7    CBC: Recent Labs  Lab 04/05/21 0409  WBC 6.1  NEUTROABS 4.1  HGB 12.2  HCT 36.6  MCV 92.7  PLT PLATELET CLUMPS NOTED ON SMEAR, COUNT APPEARS DECREASED    CBG: Recent Labs  Lab 04/04/21 0519  GLUCAP 114*    Active Problems:   Morbid obesity ([place])    Signed:  [facility], [place] [phone] 04/05/2021, 4:12 PM

## 2021-04-05 NOTE — Progress Notes (Signed)
Nutrition Education Note ° °Received consult for diet education for patient s/p bariatric surgery. ° °Discussed 2 week post op diet with pt. Emphasized that liquids must be non carbonated, non caffeinated, and sugar free. Fluid goals discussed. Pt to follow up with outpatient bariatric RD for further diet progression after 2 weeks. Multivitamins and minerals also reviewed. Teach back method used, pt expressed understanding, expect good compliance. ° °If nutrition issues arise, please consult RD. ° °Tammy Gundrum, MS, RD, LDN °Inpatient Clinical Dietitian °Contact information available via Amion ° ° °

## 2021-04-05 NOTE — Discharge Instructions (Signed)

## 2021-04-05 NOTE — Progress Notes (Signed)
24hr fluid recall: 628mL.  Per dehydration protocol, will call pt to f/u within one week post op.

## 2021-04-05 NOTE — Progress Notes (Signed)
Patient alert and oriented, pain is controlled. Patient is tolerating fluids, advanced to protein shake today, patient is tolerating well. Reviewed Gastric Bypass discharge instructions with patient and patient is able to articulate understanding. Provided information on BELT program, Support Group and [facility] outpatient pharmacy. All questions answered, will continue to monitor.    

## 2021-04-05 NOTE — Progress Notes (Signed)
S: No acute events.  She slept fairly well, some epigastric and left subcostal pain which is improved compared to yesterday.  No nausea, no dysphagia, she is passing some flatus.  Tolerating liquids without difficulty  O: Vitals, labs, intake/output, and orders reviewed at this time.  T-max 98.4, heart rate 64, respirations 16, mildly hypertensive, 97% on room air.  CMP unremarkable, white count 6.1 (6.8 preop), hemoglobin 12.2 (13.5) She has received Toradol twice, simethicone twice, no other prns   Gen: A&Ox3, no distress  H&N: EOMI, atraumatic, neck supple Chest: unlabored respirations, RRR Abd: soft, minimally tender in the epigastrium, nondistended, incision(s) c/d/i with Steri-Strips and Band-Aids.  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  She is doing well. -Continue clear liquids and protein shakes as tolerated -Ambulate, SCDs while in bed, continue prophylactic Lovenox, continue pulmonary toilet -We will resume Eliquis this evening -Likely discharge home this afternoon   Romana Juniper, MD [facility], [place]

## 2021-04-05 NOTE — Plan of Care (Signed)
  Problem: Education: ?Goal: Ability to state signs and symptoms to report to health care provider will improve ?Outcome: Completed/Met ?Goal: Knowledge of the prescribed self-care regimen will improve ?Outcome: Completed/Met ?Goal: Knowledge of discharge needs will improve ?Outcome: Completed/Met ?  ?Problem: Activity: ?Goal: Ability to tolerate increased activity will improve ?Outcome: Completed/Met ?  ?Problem: Bowel/Gastric: ?Goal: Gastrointestinal status for postoperative course will improve ?Outcome: Completed/Met ?Goal: Occurrences of nausea will decrease ?Outcome: Completed/Met ?  ?Problem: Coping: ?Goal: Development of coping mechanisms to deal with changes in body function or appearance will improve ?Outcome: Completed/Met ?  ?Problem: Fluid Volume: ?Goal: Maintenance of adequate hydration will improve ?Outcome: Completed/Met ?  ?Problem: Nutritional: ?Goal: Nutritional status will improve ?Outcome: Completed/Met ?  ?Problem: Clinical Measurements: ?Goal: Will show no signs or symptoms of venous thromboembolism ?Outcome: Completed/Met ?Goal: Will remain free from infection ?Outcome: Completed/Met ?Goal: Will show no signs of GI Leak ?Outcome: Completed/Met ?  ?Problem: Respiratory: ?Goal: Will regain and/or maintain adequate ventilation ?Outcome: Completed/Met ?  ?Problem: Pain Management: ?Goal: Pain level will decrease ?Outcome: Completed/Met ?  ?Problem: Skin Integrity: ?Goal: Demonstration of wound healing without infection will improve ?Outcome: Completed/Met ?  ?

## 2021-04-06 ENCOUNTER — Telehealth (HOSPITAL_COMMUNITY): Payer: Self-pay | Admitting: *Deleted

## 2021-04-06 NOTE — Telephone Encounter (Addendum)
1.  Tell me about your pain and pain management? Pt is c/o extreme sharp 8/10 "gas pains and cramping" that are constant.  Pt states that she can't tolerate pain medication because it makes her so sleepy.  Pt has been taking Gas-X without relief and ambulating.  Pt has only been able to consume about 30 fl oz today.    2.  Let's talk about fluid intake.  How much total fluid are you taking in? Pt states that she is working to meet goal of 64 oz of fluid today.  Pt has consumed one protein shake, some water, broth, and Gatorade (about 30 fl oz).  Pt states that she is in too much discomfort to drink.   3.  How much protein have you taken in the last 2 days? Pt states that she is working to meet goal of goal of 60g of protein today.  See #2.  4.  Have you had nausea?  Tell me about when have experienced nausea and what you did to help? Pt denies N/V/D.   5.  Has the frequency or color changed with your urine? Pt states that she is urinating "fine" with no changes in frequency or urgency.     6.  Tell me what your incisions look like? "Incisions look fine". Pt is unsure of having a fever. Denies chills.  Pt states incisions are not swollen, open, or draining, but are still bruised at the bottom.  Pt encouraged to call [facility] if incisions change.   7.  Have you been passing gas? BM? Pt states that s/he has not had a BM.  Pt instructed to take either Miralax or MoM as instructed per "Gastric Bypass/Sleeve Discharge Home Care Instructions".  Pt to call surgeon's office if not able to have BM with medication.   8.  If a problem or question were to arise who would you call?  Do you know contact numbers for [place], [facility], and [facility]? Pt does c/o feeling lightheaded and dizzy at times. Pt can describe s/sx of dehydration.  Pt knows to call [facility] for surgical, [facility] for nutrition, and [place] for non-urgent questions or concerns.  Instructed pt to call office.    9.  How has the walking going? Pt states she is walking  around and able to be active without difficulty.   10. Are you still using your incentive spirometer?  If so, how often? Pt encouraged to use incentive spirometer, at least 10x every hour while awake until s/he sees the surgeon.  11.  How are your vitamins and calcium going?  How are you taking them? Pt states that she is taking her supplements and vitamins without difficulty.  Reminded patient that the first 30 days post-operatively are important for successful recovery.  Practice good hand hygiene, wearing a mask when appropriate (since optional in most places), and minimizing exposure to people who live outside of the home, especially if they are exhibiting any respiratory, GI, or illness-like symptoms.

## 2021-04-10 ENCOUNTER — Telehealth (HOSPITAL_COMMUNITY): Payer: Self-pay | Admitting: *Deleted

## 2021-04-10 NOTE — Telephone Encounter (Signed)
Called to f/u with patient from last week after initial call. Pt states that she has been experiencing some intermittent abdominal cramping while drinking that lasts for a few seconds and then "goes away".  Pt states that "you can just feel it going down.  Pt can tolerate protein shakes and water.  Pt does state that pain is "much better" than last week.  Pt instructed to call [facility] if pain worsens.  Pt states that she is getting in at least 64 oz of fluid and meeting protein goals.  Pt denies current nausea. Encouraged to use medication as needed.   Pt states that she is urinating "fine".    Pt still c/o bruising to abdominal incisions.  Pt is currently taking Eliquis.   Pt states that she is having BMs. Last BM 04/08/18.  Pt asked about Linzess refill.  Instructed pt to call prescribing provider.  Pt denies dehydration symptoms.  Pt can describe s/sx of dehydration.  Pt knows to call [facility] for surgical, [facility] for nutrition, and [facility] for non-urgent questions or concerns.  Pt states that she is taking her supplements and vitamins without difficulty.  Pt stating that she ordered bariatric capsules due to "nasty taste" of bariatric multivitamin.  Pt instructed to take chewable tablet for first month of surgery for optimal absorption as she continues to heal.

## 2021-04-18 ENCOUNTER — Other Ambulatory Visit: Payer: Self-pay

## 2021-04-18 ENCOUNTER — Encounter: Payer: 59 | Attending: Surgery | Admitting: Skilled Nursing Facility1

## 2021-04-18 DIAGNOSIS — N189 Chronic kidney disease, unspecified: Secondary | ICD-10-CM | POA: Insufficient documentation

## 2021-04-18 NOTE — Progress Notes (Signed)
2 Week Post-Operative Nutrition Class   Patient was seen on 04/17/2021 for Post-Operative Nutrition education at [facility].    Surgery date: 04/04/2021 Surgery type: RYGB Start weight at [facility]: 272 pounds Weight today: 243 pounds Bowel Habits: Every day to every other day no complaints   Body Composition Scale 04/18/2021  Current Body Weight 243  Total Body Fat % 45.6  Visceral Fat 17  Fat-Free Mass % 54.3   Total Body Water % 41.6  Muscle-Mass lbs 30.5  BMI 41.6  Body Fat Displacement          Torso  lbs 68.7         Left Leg  lbs 13.7         Right Leg  lbs 13.7         Left Arm  lbs 6.8         Right Arm   lbs 6.8      The following the learning objectives were met by the patient during this course: Identifies Phase 3 (Soft, High Proteins) Dietary Goals and will begin from 2 weeks post-operatively to 2 months post-operatively Identifies appropriate sources of fluids and proteins  Identifies appropriate fat sources and healthy verses unhealthy fat types   States protein recommendations and appropriate sources post-operatively Identifies the need for appropriate texture modifications, mastication, and bite sizes when consuming solids Identifies appropriate fat consumption and sources Identifies appropriate multivitamin and calcium sources post-operatively Describes the need for physical activity post-operatively and will follow MD recommendations States when to call healthcare provider regarding medication questions or post-operative complications   Handouts given during class include: Phase 3A: Soft, High Protein Diet Handout Phase 3 High Protein Meals Healthy Fats   Follow-Up Plan: Patient will follow-up at [facility] in 6 weeks for 2 month post-op nutrition visit for diet advancement per MD.

## 2021-04-24 ENCOUNTER — Telehealth: Payer: Self-pay | Admitting: Skilled Nursing Facility1

## 2021-04-24 NOTE — Telephone Encounter (Signed)
RD called pt to verify fluid intake once starting soft, solid proteins 2 week post-bariatric surgery.   Daily Fluid intake:  Daily Protein intake: Bowel Habits:   Concerns/issues:    LVM 

## 2021-04-26 ENCOUNTER — Telehealth: Payer: Self-pay | Admitting: Skilled Nursing Facility1

## 2021-04-26 NOTE — Telephone Encounter (Signed)
Returned pts call.    Pt had some food questions.   These questions were answered to her satisfaction.

## 2021-04-27 ENCOUNTER — Other Ambulatory Visit (HOSPITAL_COMMUNITY): Payer: Self-pay

## 2021-05-16 ENCOUNTER — Telehealth: Payer: Self-pay | Admitting: Skilled Nursing Facility1

## 2021-05-16 NOTE — Telephone Encounter (Signed)
Returned pts call.   Pt states she is not able to eat the way she is supposed to eat because she cannot ONLY eat protein. Pt states she can only fit an ounce of food at once. Pt states she does not and never liked seafood. Pt states she does not have any nausea or vomiting.  Pt state she is drinking protein shakes to reach her goals.   Pt states she tried lentils and refried beans but burnt out on them.   Pt states she wants to advance her diet: dietitian advised other food groups do not have the protein so that will not increase her reaching her protein goal  Advice:  Be sure to use moist cooking methods such as instantpot and crockpot for all meats with low fat/sugar sauces   Continue to drink the protein shakes to reach your goal  Continue to eat plant based proteins   This is hard but you can do this!

## 2021-05-31 ENCOUNTER — Ambulatory Visit: Payer: 59 | Admitting: Skilled Nursing Facility1

## 2021-05-31 ENCOUNTER — Encounter: Payer: 59 | Attending: Surgery | Admitting: Skilled Nursing Facility1

## 2021-05-31 NOTE — Progress Notes (Signed)
Bariatric Nutrition Follow-Up Visit Medical Nutrition Therapy   NUTRITION ASSESSMENT    Surgery date: 04/04/2021 Surgery type: RYGB Start weight at [facility]: 272 pounds Weight today: virtual appt; pt identified by name and DOB, pt agreeable to limitations of this visit type   Body Composition Scale 04/18/2021  Current Body Weight 243  Total Body Fat % 45.6  Visceral Fat 17  Fat-Free Mass % 54.3   Total Body Water % 41.6  Muscle-Mass lbs 30.5  BMI 41.6  Body Fat Displacement          Torso  lbs 68.7         Left Leg  lbs 13.7         Right Leg  lbs 13.7         Left Arm  lbs 6.8         Right Arm   lbs 6.8   Clinical  Medical hx: Anxiety, arthritis, depression, GERD, sleep apnea, hyperlipidemia, HTN, kidney disease, nonalcoholic fatty liver, seizures, thyroid disease, prediabetes, gastric ulcers, collagen vascular disease, kidney stone, vitamin D, stress incontinence, pulmonary blood clots  Medications: see list Labs: WNL; A1C 6.1, triglycerides 216 Notable signs/symptoms: pain Any previous deficiencies? vitamin D, Vitamin B12  Lifestyle & Dietary Hx  Increased linzess due to constipation but has not helped so taking mirilax every night.   Pt states food does not taste good.  Pt states she weighs once a week on saturdays.   Estimated daily fluid intake: 52-58 oz Estimated daily protein intake: 60 g Supplements: multivitamin and calcium Current average weekly physical activity: Some walking    24-Hr Dietary Recall: bottle of water 6am First Meal: coffee + protein shake (109min-60 min to drink due to getting busy) Snack:   Second Meal:  steak or chicken or chili Snack:   Third Meal:  steak or chicken or chili Snack: sugar free popcicle Beverages: decaf coffee + protein shake, water  Post-Op Goals/ Signs/ Symptoms Using straws: no Drinking while eating: no Chewing/swallowing difficulties: no Changes in vision: no Changes to mood/headaches: no Hair loss/changes to  skin/nails: no Difficulty focusing/concentrating: no Sweating: no Limb weakness: no Dizziness/lightheadedness: no Palpitations: no  Carbonated/caffeinated beverages: no N/V/D/C/Gas: no Abdominal pain: no Dumping syndrome: no    NUTRITION DIAGNOSIS  Overweight/obesity ([place]-3.3) related to past poor dietary habits and physical inactivity as evidenced by completed bariatric surgery and following dietary guidelines for continued weight loss and healthy nutrition status.     NUTRITION INTERVENTION Nutrition counseling (C-1) and education (E-2) to facilitate bariatric surgery goals, including: Diet advancement to the next phase (phase 4) now including non starchy vegetables  The importance of consuming adequate calories as well as certain nutrients daily due to the body's need for essential vitamins, minerals, and fats The importance of daily physical activity and to reach a goal of at least 150 minutes of moderate to vigorous physical activity weekly (or as directed by their physician) due to benefits such as increased musculature and improved lab values The importance of intuitive eating specifically learning hunger-satiety cues and understanding the importance of learning a new body: The importance of mindful eating to avoid grazing behaviors  Goals: -Continue to aim for a minimum of 64 fluid ounces 7 days a week with at least 30 ounces being plain water  -Eat non-starchy vegetables 2 times a day 7 days a week  -Start out with soft cooked vegetables today and tomorrow; if tolerated begin to eat raw vegetables or cooked including salads  -  Eat your 3 ounces of protein first then start in on your non-starchy vegetables; once you understand how much of your meal leads to satisfaction and not full while still eating 3 ounces of protein and non-starchy vegetables you can eat them in any order   -Continue to aim for 30 minutes of activity at least 5 times a week  -Do NOT cook with/add to your  food: alfredo sauce, cheese sauce, barbeque sauce, ketchup, fat back, butter, bacon grease, grease, Crisco, OR SUGAR   Handouts Provided Include  Phase 4  Learning Style & Readiness for Change Teaching method utilized: Visual & Auditory  Demonstrated degree of understanding via: Teach Back  Readiness Level: action Barriers to learning/adherence to lifestyle change: none identified   RD's Notes for Next Visit Assess adherence to pt chosen goals    MONITORING & EVALUATION Dietary intake, weekly physical activity, body weight  Next Steps Patient is to follow-up in February

## 2021-08-03 ENCOUNTER — Other Ambulatory Visit: Payer: Self-pay | Admitting: Gastroenterology

## 2021-08-03 DIAGNOSIS — K5900 Constipation, unspecified: Secondary | ICD-10-CM

## 2021-08-04 ENCOUNTER — Ambulatory Visit
Admission: RE | Admit: 2021-08-04 | Discharge: 2021-08-04 | Disposition: A | Discharge status: Home / Self Care | Payer: 59 | Attending: Gastroenterology | Admitting: Gastroenterology

## 2021-08-04 ENCOUNTER — Ambulatory Visit
Admission: RE | Admit: 2021-08-04 | Discharge: 2021-08-04 | Disposition: A | Discharge status: Home / Self Care | Payer: 59 | Source: Ambulatory Visit | Attending: Gastroenterology | Admitting: Gastroenterology

## 2021-08-04 DIAGNOSIS — K5900 Constipation, unspecified: Secondary | ICD-10-CM | POA: Diagnosis present

## 2021-09-07 ENCOUNTER — Ambulatory Visit: Payer: 59 | Admitting: Skilled Nursing Facility1

## 2021-09-07 ENCOUNTER — Encounter: Payer: 59 | Attending: Surgery | Admitting: Skilled Nursing Facility1

## 2021-09-07 NOTE — Progress Notes (Signed)
Bariatric Nutrition Follow-Up Visit Medical Nutrition Therapy   NUTRITION ASSESSMENT    Surgery date: 04/04/2021 Surgery type: RYGB Start weight at [facility]: 272 pounds Weight today: virtual appt; pt identified by name and DOB, pt agreeable to limitations of this visit type   Body Composition Scale 04/18/2021  Current Body Weight 243  Total Body Fat % 45.6  Visceral Fat 17  Fat-Free Mass % 54.3   Total Body Water % 41.6  Muscle-Mass lbs 30.5  BMI 41.6  Body Fat Displacement          Torso  lbs 68.7         Left Leg  lbs 13.7         Right Leg  lbs 13.7         Left Arm  lbs 6.8         Right Arm   lbs 6.8   Clinical  Medical hx: Anxiety, arthritis, depression, GERD, sleep apnea, hyperlipidemia, HTN, kidney disease, nonalcoholic fatty liver, seizures, thyroid disease, prediabetes, gastric ulcers, collagen vascular disease, kidney stone, vitamin D, stress incontinence, pulmonary blood clots  Medications: see list Labs: WNL; A1C 6.1, triglycerides 216 Notable signs/symptoms: pain Any previous deficiencies? vitamin D, Vitamin B12  Lifestyle & Dietary Hx  Pt joined by [employer] her chihuahua.  Pt currently has Covid stating she mostly has a headache and upper respiratory symptoms.  Pt states she takes Prilosec 2 times a day.  Pt states her doctor put her back on Lexapro but it made her feel angry so she stopped taking.  Pt states she has been having a lot of dizziness. Pt states when she eats her stomach hurts especially where the hernia was. Pt states she always feels full when she eats. Pt states the past few days she has been feeling nauseas.  Pt state she really does not eat vegetables with any consistency.  Pt states she does log her food and beverage.   Estimated daily fluid intake: 60 oz Estimated daily protein intake: 60 g Supplements: multivitamin and calcium; b12 shots, vitamin D once a week Current average weekly physical activity: Some walking    24-Hr Dietary Recall:  bottle of water 6am First Meal: bacon or 1-2 eggs Snack:   Second Meal: chicken salad or protein shake Snack:   Third Meal:  steak or chicken or chili or (4-6) sausage balls Snack: sugar free popcicle Beverages: decaf coffee + protein shake, water  Post-Op Goals/ Signs/ Symptoms Using straws: no Drinking while eating: no Chewing/swallowing difficulties: no Changes in vision: no Changes to mood/headaches: no Hair loss/changes to skin/nails: no Difficulty focusing/concentrating: no Sweating: no Limb weakness: no Dizziness/lightheadedness: no Palpitations: no  Carbonated/caffeinated beverages: no N/V/D/C/Gas: cenecote every night and 2-4 dulcolax daily Abdominal pain: no Dumping syndrome: no    NUTRITION DIAGNOSIS  Overweight/obesity ([place]-3.3) related to past poor dietary habits and physical inactivity as evidenced by completed bariatric surgery and following dietary guidelines for continued weight loss and healthy nutrition status.     NUTRITION INTERVENTION Nutrition counseling (C-1) and education (E-2) to facilitate bariatric surgery goals, including: Diet advancement to the next phase (phase 4) now including non starchy vegetables  The importance of consuming adequate calories as well as certain nutrients daily due to the body's need for essential vitamins, minerals, and fats The importance of daily physical activity and to reach a goal of at least 150 minutes of moderate to vigorous physical activity weekly (or as directed by their physician) due to benefits such  as increased musculature and improved lab values The importance of intuitive eating specifically learning hunger-satiety cues and understanding the importance of learning a new body: The importance of mindful eating to avoid grazing behaviors  Importance of vegetables To have an overall healthy diet, adult men and women are recommended to consume anywhere from 2-3 cups of vegetables daily. Vegetables provide a wide  range of vitamins and minerals such as vitamin A, vitamin C, potassium, and folic acid. According to the [facility], including fruit and vegetables daily may reduce the risk of cardiovascular disease, certain cancers, and other non-communicable diseases. Encouraged patient to honor their body's internal hunger and fullness cues.  Throughout the day, check in mentally and rate hunger. Stop eating when satisfied not full regardless of how much food is left on the plate.  Get more if still hungry 20-30 minutes later.  The key is to honor satisfaction so throughout the meal, rate fullness factor and stop when comfortably satisfied not physically full. The key is to honor hunger and fullness without any feelings of guilt or shame.  Pay attention to what the internal cues are, rather than any external factors. This will enhance the confidence you have in listening to your own body and following those internal cues enabling you to increase how often you eat when you are hungry not out of appetite and stop when you are satisfied not full.  Encouraged pt to continue to eat balanced meals inclusive of non starchy vegetables 2 times a day 7 days a week Encouraged pt to choose lean protein sources: limiting beef, pork, sausage, hotdogs, and lunch meat Encourage pt to choose healthy fats such as plant based limiting animal fats Encouraged pt to continue to drink a minium 64 fluid ounces with half being plain water to satisfy proper hydration   Learning Style & Readiness for Change Teaching method utilized: Visual & Auditory  Demonstrated degree of understanding via: Teach Back  Readiness Level: action Barriers to learning/adherence to lifestyle change: none identified   RD's Notes for Next Visit Assess adherence to pt chosen goals    MONITORING & EVALUATION Dietary intake, weekly physical activity, body weight  Next Steps Patient is to follow-up in September

## 2021-12-12 ENCOUNTER — Other Ambulatory Visit: Payer: Self-pay | Admitting: Orthopedic Surgery

## 2021-12-12 DIAGNOSIS — S46001D Unspecified injury of muscle(s) and tendon(s) of the rotator cuff of right shoulder, subsequent encounter: Secondary | ICD-10-CM

## 2021-12-12 DIAGNOSIS — M7551 Bursitis of right shoulder: Secondary | ICD-10-CM

## 2021-12-12 DIAGNOSIS — M25311 Other instability, right shoulder: Secondary | ICD-10-CM

## 2021-12-20 ENCOUNTER — Ambulatory Visit
Admission: RE | Admit: 2021-12-20 | Discharge: 2021-12-20 | Disposition: A | Discharge status: Home / Self Care | Payer: 59 | Source: Ambulatory Visit | Attending: Orthopedic Surgery | Admitting: Orthopedic Surgery

## 2021-12-20 DIAGNOSIS — M25311 Other instability, right shoulder: Secondary | ICD-10-CM

## 2021-12-20 DIAGNOSIS — S46001D Unspecified injury of muscle(s) and tendon(s) of the rotator cuff of right shoulder, subsequent encounter: Secondary | ICD-10-CM

## 2021-12-20 DIAGNOSIS — M7551 Bursitis of right shoulder: Secondary | ICD-10-CM

## 2021-12-27 ENCOUNTER — Encounter: Payer: Self-pay | Admitting: Orthopedic Surgery

## 2021-12-27 ENCOUNTER — Other Ambulatory Visit: Payer: Self-pay | Admitting: Orthopedic Surgery

## 2022-01-01 ENCOUNTER — Ambulatory Visit: Payer: 59 | Admitting: Anesthesiology

## 2022-01-01 ENCOUNTER — Encounter: Payer: Self-pay | Admitting: Orthopedic Surgery

## 2022-01-01 ENCOUNTER — Ambulatory Visit
Admission: RE | Admit: 2022-01-01 | Discharge: 2022-01-01 | Disposition: A | Discharge status: Home / Self Care | Payer: 59 | Attending: Orthopedic Surgery | Admitting: Orthopedic Surgery

## 2022-01-01 ENCOUNTER — Other Ambulatory Visit: Payer: Self-pay

## 2022-01-01 ENCOUNTER — Encounter
Admission: RE | Disposition: A | Discharge status: Home / Self Care | Payer: Self-pay | Source: Home / Self Care | Attending: Orthopedic Surgery

## 2022-01-01 DIAGNOSIS — Z8673 Personal history of transient ischemic attack (TIA), and cerebral infarction without residual deficits: Secondary | ICD-10-CM | POA: Diagnosis not present

## 2022-01-01 DIAGNOSIS — M25811 Other specified joint disorders, right shoulder: Secondary | ICD-10-CM | POA: Diagnosis present

## 2022-01-01 DIAGNOSIS — Z8711 Personal history of peptic ulcer disease: Secondary | ICD-10-CM | POA: Insufficient documentation

## 2022-01-01 DIAGNOSIS — K219 Gastro-esophageal reflux disease without esophagitis: Secondary | ICD-10-CM | POA: Diagnosis not present

## 2022-01-01 DIAGNOSIS — M7521 Bicipital tendinitis, right shoulder: Secondary | ICD-10-CM | POA: Diagnosis not present

## 2022-01-01 DIAGNOSIS — F419 Anxiety disorder, unspecified: Secondary | ICD-10-CM | POA: Diagnosis not present

## 2022-01-01 DIAGNOSIS — M75101 Unspecified rotator cuff tear or rupture of right shoulder, not specified as traumatic: Secondary | ICD-10-CM | POA: Insufficient documentation

## 2022-01-01 DIAGNOSIS — M19011 Primary osteoarthritis, right shoulder: Secondary | ICD-10-CM | POA: Diagnosis not present

## 2022-01-01 DIAGNOSIS — Z9884 Bariatric surgery status: Secondary | ICD-10-CM | POA: Diagnosis not present

## 2022-01-01 HISTORY — DX: Cerebral infarction, unspecified: I63.9

## 2022-01-01 HISTORY — PX: SHOULDER ARTHROSCOPY: SHX128

## 2022-01-01 SURGERY — ARTHROSCOPY, SHOULDER
Anesthesia: Regional | Site: Shoulder | Laterality: Right

## 2022-01-01 MED ORDER — PROPOFOL 10 MG/ML IV BOLUS
INTRAVENOUS | Status: DC | PRN
  Administered 2022-01-01: 80 mg via INTRAVENOUS

## 2022-01-01 MED ORDER — BUPIVACAINE LIPOSOME 1.3 % IJ SUSP
INTRAMUSCULAR | Status: DC | PRN
  Administered 2022-01-01: 20 mL via PERINEURAL

## 2022-01-01 MED ORDER — PROPOFOL 10 MG/ML IV BOLUS: INTRAVENOUS | Status: DC | PRN

## 2022-01-01 MED ORDER — LACTATED RINGERS IV SOLN: INTRAVENOUS | Status: DC

## 2022-01-01 MED ORDER — PROPOFOL 500 MG/50ML IV EMUL
INTRAVENOUS | Status: DC | PRN
  Administered 2022-01-01: 120 ug/kg/min via INTRAVENOUS

## 2022-01-01 MED ORDER — ONDANSETRON HCL 4 MG/2ML IJ SOLN: 4.0000 mg | Freq: Once | INTRAMUSCULAR | Status: DC | PRN

## 2022-01-01 MED ORDER — ASPIRIN 325 MG PO TBEC: 325.0000 mg | DELAYED_RELEASE_TABLET | Freq: Every day | ORAL | 0 refills | Status: AC

## 2022-01-01 MED ORDER — ONDANSETRON HCL 4 MG/2ML IJ SOLN
INTRAMUSCULAR | Status: DC | PRN
  Administered 2022-01-01: 4 mg via INTRAVENOUS

## 2022-01-01 MED ORDER — CEFAZOLIN SODIUM-DEXTROSE 2-3 GM-%(50ML) IV SOLR
INTRAVENOUS | Status: DC | PRN
  Administered 2022-01-01: 2 g via INTRAVENOUS

## 2022-01-01 MED ORDER — VANCOMYCIN HCL IN DEXTROSE 1-5 GM/200ML-% IV SOLN
1000.0000 mg | INTRAVENOUS | Status: AC
  Administered 2022-01-01: 1000 mg via INTRAVENOUS

## 2022-01-01 MED ORDER — OXYCODONE HCL 5 MG PO TABS
5.0000 mg | ORAL_TABLET | Freq: Once | ORAL | Status: AC | PRN
  Administered 2022-01-01: 5 mg via ORAL

## 2022-01-01 MED ORDER — FENTANYL CITRATE (PF) 100 MCG/2ML IJ SOLN
INTRAMUSCULAR | Status: DC | PRN
  Administered 2022-01-01: 100 ug via INTRAVENOUS

## 2022-01-01 MED ORDER — LACTATED RINGERS IR SOLN
Status: DC | PRN
  Administered 2022-01-01: 42000 mL

## 2022-01-01 MED ORDER — ACETAMINOPHEN 160 MG/5ML PO SOLN: 325.0000 mg | ORAL | Status: DC | PRN

## 2022-01-01 MED ORDER — ONDANSETRON HCL 4 MG/2ML IJ SOLN
INTRAMUSCULAR | Status: DC | PRN
  Administered 2022-01-01: 4 mg via INTRAMUSCULAR

## 2022-01-01 MED ORDER — ONDANSETRON 4 MG PO TBDP: 4.0000 mg | ORAL_TABLET | Freq: Three times a day (TID) | ORAL | 0 refills | Status: DC | PRN

## 2022-01-01 MED ORDER — ACETAMINOPHEN 500 MG PO TABS: 1000.0000 mg | ORAL_TABLET | Freq: Three times a day (TID) | ORAL | 2 refills | Status: DC

## 2022-01-01 MED ORDER — GLYCOPYRROLATE 0.2 MG/ML IJ SOLN
INTRAMUSCULAR | Status: DC | PRN
  Administered 2022-01-01: .2 mg via INTRAVENOUS

## 2022-01-01 MED ORDER — ACETAMINOPHEN 10 MG/ML IV SOLN
1000.0000 mg | Freq: Once | INTRAVENOUS | Status: AC
  Administered 2022-01-01: 1000 mg via INTRAVENOUS

## 2022-01-01 MED ORDER — BUPIVACAINE HCL (PF) 0.5 % IJ SOLN
INTRAMUSCULAR | Status: DC | PRN
  Administered 2022-01-01: 20 mL via PERINEURAL

## 2022-01-01 MED ORDER — OXYCODONE HCL 5 MG PO TABS: 5.0000 mg | ORAL_TABLET | ORAL | 0 refills | Status: DC | PRN

## 2022-01-01 MED ORDER — LACTATED RINGERS IV SOLN
INTRAVENOUS | Status: DC | PRN
  Administered 2022-01-01: 4 mL

## 2022-01-01 MED ORDER — ACETAMINOPHEN 325 MG PO TABS: 325.0000 mg | ORAL_TABLET | ORAL | Status: DC | PRN

## 2022-01-01 MED ORDER — MIDAZOLAM HCL 2 MG/2ML IJ SOLN
INTRAMUSCULAR | Status: DC | PRN
  Administered 2022-01-01: 2 mg via INTRAVENOUS

## 2022-01-01 SURGICAL SUPPLY — 65 items
ADAPTER IRRIG TUBE 2 SPIKE SOL (ADAPTER) ×4 IMPLANT
ADH SKN CLS APL DERMABOND .7 (GAUZE/BANDAGES/DRESSINGS) ×1
ADPR TBG 2 SPK PMP STRL ASCP (ADAPTER) ×2
ANCH SUT 2 SWLK 19.1 CLS EYLT (Anchor) ×3 IMPLANT
ANCH SUT 5 3.9 CRKSW KNTLS (Anchor) ×1 IMPLANT
ANCH SUT SHRT 12.5 CANN EYLT (Anchor) ×1 IMPLANT
ANCHOR 3.9 PEEK CORKSCREW 5MTS (Anchor) ×1 IMPLANT
ANCHOR ICONIX SPEED 2.3 (Anchor) ×1 IMPLANT
ANCHOR SUT BIOCOMP LK 2.9X12.5 (Anchor) ×1 IMPLANT
ANCHOR SWIVELOCK BIO 4.75X19.1 (Anchor) ×3 IMPLANT
APL PRP STRL LF DISP 70% ISPRP (MISCELLANEOUS) ×1
BLADE SHAVER 4.5X7 STR FR (MISCELLANEOUS) ×2 IMPLANT
BUR BR 5.5 WIDE MOUTH (BURR) ×2 IMPLANT
CANNULA PART THRD DISP 5.75X7 (CANNULA) ×2 IMPLANT
CANNULA PARTIAL THREAD 2X7 (CANNULA) ×2 IMPLANT
CANNULA TWIST IN 8.25X7CM (CANNULA) ×1 IMPLANT
CHLORAPREP W/TINT 26 (MISCELLANEOUS) ×2 IMPLANT
COOLER POLAR GLACIER W/PUMP (MISCELLANEOUS) ×2 IMPLANT
COVER LIGHT HANDLE UNIVERSAL (MISCELLANEOUS) ×4 IMPLANT
DERMABOND ADVANCED (GAUZE/BANDAGES/DRESSINGS) ×1
DERMABOND ADVANCED .7 DNX12 (GAUZE/BANDAGES/DRESSINGS) ×1 IMPLANT
DRAPE INCISE IOBAN 66X45 STRL (DRAPES) ×2 IMPLANT
DRAPE U-SHAPE 48X52 POLY STRL (PACKS) ×2 IMPLANT
DRSG TEGADERM 4X4.75 (GAUZE/BANDAGES/DRESSINGS) ×6 IMPLANT
ELECT REM PT RETURN 9FT ADLT (ELECTROSURGICAL) ×2
ELECTRODE REM PT RTRN 9FT ADLT (ELECTROSURGICAL) ×1 IMPLANT
GAUZE SPONGE 4X4 12PLY STRL (GAUZE/BANDAGES/DRESSINGS) ×2 IMPLANT
GAUZE XEROFORM 1X8 LF (GAUZE/BANDAGES/DRESSINGS) ×2 IMPLANT
GLOVE SRG 8 PF TXTR STRL LF DI (GLOVE) ×3 IMPLANT
GLOVE SURG ENC MOIS LTX SZ7.5 (GLOVE) ×10 IMPLANT
GLOVE SURG UNDER POLY LF SZ8 (GLOVE) ×6
GOWN STRL REIN 2XL XLG LVL4 (GOWN DISPOSABLE) ×2 IMPLANT
GOWN STRL REUS W/ TWL LRG LVL3 (GOWN DISPOSABLE) ×3 IMPLANT
GOWN STRL REUS W/TWL LRG LVL3 (GOWN DISPOSABLE) ×6
IV LACTATED RINGER IRRG 3000ML (IV SOLUTION) ×28
IV LR IRRIG 3000ML ARTHROMATIC (IV SOLUTION) ×6 IMPLANT
KIT CORKSCREW KNTLS 3.9 S/T/P (INSTRUMENTS) ×1 IMPLANT
KIT DISPOSABLE PUSHLOCK 2.9MM (KITS) ×1 IMPLANT
KIT STABILIZATION SHOULDER (MISCELLANEOUS) ×2 IMPLANT
KIT TURNOVER KIT A (KITS) ×2 IMPLANT
MANIFOLD 4PT FOR NEPTUNE1 (MISCELLANEOUS) ×2 IMPLANT
MASK FACE SPIDER DISP (MASK) ×2 IMPLANT
MAT ABSORB  FLUID 56X50 GRAY (MISCELLANEOUS) ×4
MAT ABSORB FLUID 56X50 GRAY (MISCELLANEOUS) ×2 IMPLANT
PACK ARTHROSCOPY SHOULDER (MISCELLANEOUS) ×2 IMPLANT
PAD ABD DERMACEA PRESS 5X9 (GAUZE/BANDAGES/DRESSINGS) ×4 IMPLANT
PAD WRAPON POLAR SHDR XLG (MISCELLANEOUS) ×1 IMPLANT
PASSER SUT FIRSTPASS SELF (INSTRUMENTS) ×1 IMPLANT
SPONGE T-LAP 18X18 ~~LOC~~+RFID (SPONGE) ×1 IMPLANT
SUT ETHILON 3-0 (SUTURE) ×2 IMPLANT
SUT MNCRL 4-0 (SUTURE) ×2
SUT MNCRL 4-0 27XMFL (SUTURE) ×1
SUT VIC AB 0 CT1 36 (SUTURE) ×2 IMPLANT
SUT VIC AB 2-0 CT2 27 (SUTURE) ×2 IMPLANT
SUT VIC AB 3-0 SH 27 (SUTURE) ×2
SUT VIC AB 3-0 SH 27X BRD (SUTURE) IMPLANT
SUTURE MNCRL 4-0 27XMF (SUTURE) ×1 IMPLANT
SUTURETAPE 1.3 40 W/NDL BLK/WH (SUTURE) ×1 IMPLANT
SYSTEM FBRTK BICEPS 1.9 DRILL (Anchor) ×1 IMPLANT
TAPE MICROFOAM 4IN (TAPE) ×2 IMPLANT
TUBING CONNECTING 10 (TUBING) ×2 IMPLANT
TUBING INFLOW SET DBFLO PUMP (TUBING) ×2 IMPLANT
TUBING OUTFLOW SET DBLFO PUMP (TUBING) ×2 IMPLANT
WAND WEREWOLF FLOW 90D (MISCELLANEOUS) ×2 IMPLANT
WRAPON POLAR PAD SHDR XLG (MISCELLANEOUS) ×2

## 2022-01-01 NOTE — H&P (Signed)
Paper H&P to be scanned into permanent record. H&P reviewed. No significant changes noted.  

## 2022-01-01 NOTE — Op Note (Signed)
SURGERY DATE: 01/01/2022   PRE-OP DIAGNOSIS:  1. Right subacromial impingement 2. Right biceps tendinopathy 3. Right rotator cuff tear 4. Right acromioclavicular joint arthritis   POST-OP DIAGNOSIS: 1. Right subacromial impingement 2. Right biceps tendinopathy with split thickness tearing 3. Right rotator cuff tear (high-grade articular sided anterior supraspinatus & upper border subscapularis) 4. Right acromioclavicular joint arthritis   PROCEDURES:  1. Right arthroscopic rotator cuff repair (subscapularis and supraspinatus) 2. Right open subpectoral biceps tenodesis 3. Right arthroscopic subacromial decompression 4. Right arthroscopic extensive debridement of shoulder (glenohumeral and subacromial spaces) 5.  Right arthroscopic distal clavicle excision   SURGEON: Cato Mulligan, MD   ANESTHESIA: Gen with Exparel interscalene block   ESTIMATED BLOOD LOSS: 5cc   DRAINS:  none   TOTAL IV FLUIDS: per anesthesia      SPECIMENS: none   IMPLANTS:  - Arthrex 2.617m PushLock x 1 - Arthrex 4.735mSwiveLock x 3 - Arthrex 3.17m71mnotless Corkscrew x 1 - Arthrex Double loaded FibSoftware engineerx1    OPERATIVE FINDINGS:  Examination under anesthesia: A careful examination under anesthesia was performed.  Passive range of motion was: FF: 150; ER at side: 60; ER in abduction: 95; IR in abduction: 45.  Anterior load shift: NT.  Posterior load shift: NT.  Sulcus in neutral: NT.  Sulcus in ER: NT.     Intra-operative findings: A thorough arthroscopic examination of the shoulder was performed.  The findings are: 1. Biceps tendon: tendinopathy with significant erythema and split thickness tearing 2. Superior labrum: erythema and type II SLAP tear 3. Posterior labrum and capsule: normal 4. Inferior capsule and inferior recess: normal 5. Glenoid cartilage surface: Normal except for small approximately 5 x 5 region of the inferior glenoid with grade 3 chondral defect 6. Supraspinatus  attachment: High-grade articular sided tear of the anterior supraspinatus involving approximately 75% of the footprint 7. Posterior rotator cuff attachment: normal 8. Humeral head articular cartilage: normal 9. Rotator interval: significant synovitis 10: Subscapularis tendon: Tear of the superior border of the subscapularis 11. Anterior labrum: Mildly degenerative 12. IGHL: normal   OPERATIVE REPORT:    Indications for procedure:  CheFIZA NATION a [age]. female with over 3 months of right shoulder pain that has failed nonoperative management including medications, activity verifications and subacromial bursa corticosteroid injection.  She has had continued difficulty with overhead motion with sensations of weakness. Clinical exam and MRI were suggestive of rotator cuff tear, biceps tendinopathy, AC joint arthritis, and subacromial impingement. After discussion of risks, benefits, and alternatives to surgery, the patient elected to proceed.    Procedure in detail:   I identified CheAVILA ALBRITTON the pre-operative holding area.  I marked the operative shoulder with my initials. I reviewed the risks and benefits of the proposed surgical intervention, and the patient wished to proceed.  Anesthesia was then performed with an Exparel interscalene block.  The patient was transferred to the operative suite and placed in the beach chair position.     Appropriate IV antibiotics were administered prior to incision. The operative upper extremity was then prepped and draped in standard fashion. A time out was performed confirming the correct extremity, correct patient, and correct procedure.   I then created a standard posterior portal with an 11 blade. The glenohumeral joint was easily entered with a blunt trocar and the arthroscope introduced. The findings of diagnostic arthroscopy are described above. I debrided degenerative tissue including the synovitic tissue about the rotator interval,  the  anterior labrum, and the superior labrum.  The small inferior glenoid cartilage defect was also debrided with an oscillating shaver such that there were stable cartilaginous edges.  I then coagulated the inflamed synovium to obtain hemostasis and reduce the risk of post-operative swelling using an Arthrocare radiofrequency device. I performed a biceps tenotomy using an arthroscopic scissors and used a motorized shaver to debride the stump back to a stable base.   I then directed my attention to the open subpectoral biceps tenodesis. I made an ~5cm incision parallel to the skin relaxation lines along the medial upper arm, such that 1/3 of the incision was above the pectoralis major tendon and 2/3 below it. Sharp dissection was carried down to the fascia overlying the pectoralis and the short head of the biceps. Bovie electrocautery was used to achieve hemostasis. I made a vertical nick in the fascia in line with the short head of the biceps. The pectoralis tendon was then retracted with a large Hohmann retractor. An [employer] retractor was used to retract inferiorly. This afforded excellent visualization of the long head of the biceps tendon. It was delivered out of the incision. I then placed a double-loaded Arthrex FiberTak all-suture anchor at the most proximal and central portion of the inter-tubercular groove that I could visualize. I took one set of tapes and with the use of a free needle, passed one strand through the biceps tendon once and passed the other strand through the biceps in a locking fashion. Suture tapes were passed through the biceps tendon starting ~1cm away from the the musculocutaneous junction. This was also done for the other tape as well. This configuration allowed me to then parachute the tendon back into the wound as the two ends of the suture tape were tied together with a Surgeon's knot. This was repeated for the other strand of suture tape. The excess proximal tendon was sharply  excised. The tension in the tenodesed long head was excellent.  A damp lap sponge was placed in the wound.    Next, arthroscopic repair of the subscapularis was performed. The lesser tuberosity footprint was prepared with a combination of electrocautery and an arthroscopic curette.  An Arthrex knotless corkscrew was placed into the lesser tuberosity footprint from the anterior portal.  A BirdBeak was used to shuttle the repair suture through the upper border of the subscapularis tendon.  The suture was then shuttled through the anchor. With the arm in neutral rotation, the repair was tensioned.  However, on final tensioning, the suture pulled through the tendon.  Therefore a knotless SwiveLock was placed.  Similarly the suture was passed through the subscapularis using a BirdBeak.  However on attempting to shuttle the repair suture through the anchor, it did not shuttle appropriately.  Therefore, the repair stitch, which were still passed through the subscapularis, was loaded onto a PushLock anchor and this was impacted into the lesser tuberosity.  This appropriately reduced the subscapularis. The arm was then internally and externally rotated and the subscapularis was noted to move appropriately with rotation.  The remainder of the suture was then cut.  Next, the arthroscope was then introduced into the subacromial space. A direct lateral portal was created with an 11-blade after spinal needle localization. An extensive subacromial bursectomy and debridement was performed using a combination of the shaver and Arthrocare wand. The entire acromial undersurface was exposed and the CA ligament was subperiosteally elevated to expose the anterior acromial hook. A 5.57m barrel burr was used to  create a flat anterior and lateral aspect of the acromion, converting it from a Type 2 to a Type 1 acromion. Care was made to keep the deltoid fascia intact.  I then turned my attention to the arthroscopic distal clavicle  excision. I identified the acromioclavicular joint. Surrounding bursal tissue was debrided and the edges of the joint were identified. I used the 5.37m barrel burr to remove the distal clavicle parallel to the edge of the acromion. I was able to fit two widths of the burr into the space between the distal clavicle and acromion, signifying that I had removed ~153mof distal clavicle.  This was confirmed using a probe to measure the space between the distal clavicle and acromion.  Hemostasis was achieved with an Arthrocare wand.   Next, I created an accessory posterolateral portal to assist with visualization and instrumentation.  There was notable fraying on the bursal side of the anterior supraspinatus.  A switching stick could easily be passed through this region into the glenohumeral joint suggestive of an essentially full-thickness tear. I debrided the poor quality edges of the supraspinatus tendon. This was a deep tear of the anterior supraspinatus.  I prepared the footprint using a burr to expose bleeding bone.    I then percutaneously placed 1 Iconix SPEED medial row anchor at the articular margin. However, this did not achieve appropriate fixation due to lack of appropriate bone quality.  I then attempted to place an Arthrex SwiveLock anchor using the Iconix Speed sutures, but the appropriate angle cannot be obtained to achieve appropriate fixation.  Therefore, decision was made to perform single row repair.  I then shuttled all 4 strands of tape through the rotator cuff just lateral to the musculotendinous junction using a FirstPass suture passer spanning the anterior to posterior extent of the tear. All 4 strands of suture were passed through an ArHCA Incnchor.  This was placed approximately 1 cm distal to the lateral edge of the footprint in line with the midportion of the tear with appropriate tensioning of each suture prior to final fixation. There remained a central dogear.  Decision was  made to not use the knotless mechanism of the SwiveLock anchor due to lack of appropriate bone quality.  A suture tape was then passed in a mattress fashion in the region of the dogear.  This was passed through a separate SwiveLock anchor that was fixed distal to the prior SwiveLock anchor.  This construct allowed for excellent reapproximation of the rotator cuff to its native footprint without undue tension.  Appropriate compression was achieved.  The repair was stable to external and internal rotation.   Fluid was evacuated from the shoulder.  The subpectoral incision was thoroughly irrigated with saline, and the incision closed in layers using 3-0 Vicryl for the deep dermis and a running subcuticular 4-0 Monocryl for skin. A thin layer of Dermabond was applied. The portals were closed with 3-0 Nylon. Xeroform was applied to the portals. A sterile dressing was applied, followed by a Polar Care sleeve and a SlingShot shoulder immobilizer/sling. The patient was awakened from anesthesia without difficulty and was transferred to the PACU in stable condition.   Additionally, this case had increased complexity compared to standard arthroscopic rotator cuff repair given the involvement of the subscapularis tear. Repair of this tear increased surgical time by 30 minutes and increased complexity due to additional preparation and repair of subscapularis tear and use of additional implants.  Furthermore supraspinatus repair was complicated compared to  standard repair due to the patient's underlying bone quality.  Anchors for the medial row did not hold well.  This caused an attempt to place multiple anchors for medial row fixation, but ultimately all were unsuccessful resulting in a single row repair.  There is also increased surgical time by approximately 20 minutes.  COMPLICATIONS: none   DISPOSITION: plan for discharge home after recovery in PACU     POSTOPERATIVE PLAN: Remain in sling (except hygiene and  elbow/wrist/hand RoM exercises as instructed by PT) x 4 weeks and NWB for this time. PT to begin 3-4 days after surgery.  Large rotator cuff repair rehab protocol with subscapularis restrictions and open biceps tenodesis restrictions. ASA '325mg'$  daily x 2 weeks for DVT ppx.

## 2022-01-01 NOTE — Anesthesia Procedure Notes (Addendum)
Date/Time: 01/01/2022 12:43 PM  Performed by: Dionne Bucy, CRNAPre-anesthesia Checklist: Patient identified, Emergency Drugs available, Suction available, Patient being monitored and Timeout performed Patient Re-evaluated:Patient Re-evaluated prior to induction Oxygen Delivery Method: Simple face mask Induction Type: IV induction Placement Confirmation: positive ETCO2

## 2022-01-01 NOTE — Anesthesia Procedure Notes (Signed)
Anesthesia Regional Block: Interscalene brachial plexus block   Pre-Anesthetic Checklist: , timeout performed,  Correct Patient, Correct Site, Correct Laterality,  Correct Procedure, Correct Position, site marked,  Risks and benefits discussed,  Surgical consent,  Pre-op evaluation,  At surgeon's request and post-op pain management  Laterality: Right  Prep: chloraprep       Needles:  Injection technique: Single-shot  Needle Type: Stimiplex     Needle Length: 10cm  Needle Gauge: 21     Additional Needles:   Procedures:,,,, ultrasound used (permanent image in chart),,    Narrative:  Start time: 01/01/2022 12:22 PM End time: 01/01/2022 12:26 PM Injection made incrementally with aspirations every 5 mL.  Performed by: Personally  Anesthesiologist: Veda Canning, MD  Additional Notes: Functioning IV was confirmed and monitors applied. Ultrasound guidance: relevant anatomy identified, needle position confirmed, local anesthetic spread visualized around nerve(s)., vascular puncture avoided.  Image printed for medical record.  Negative aspiration and no paresthesias; incremental administration of local anesthetic. The patient tolerated the procedure well. Vitals signs recorded in RN notes.

## 2022-01-01 NOTE — Transfer of Care (Signed)
Immediate Anesthesia Transfer of Care Note  Patient: Tammy Young  Procedure(s) Performed: Right shoulder arthroscopic rotator cuff repair, subscapularis repaire, subpectoral biceps tenodesis, distal clavical excision (Right: Shoulder)  Patient Location: PACU  Anesthesia Type: General, Regional  Level of Consciousness: awake, alert  and patient cooperative  Airway and Oxygen Therapy: Patient Spontanous Breathing and Patient connected to supplemental oxygen  Post-op Assessment: Post-op Vital signs reviewed, Patient's Cardiovascular Status Stable, Respiratory Function Stable, Patent Airway and No signs of Nausea or vomiting  Post-op Vital Signs: Reviewed and stable  Complications: No notable events documented.

## 2022-01-01 NOTE — Anesthesia Postprocedure Evaluation (Signed)
Anesthesia Post Note  Patient: Tammy Young  Procedure(s) Performed: Right shoulder arthroscopic rotator cuff repair, subscapularis repaire, subpectoral biceps tenodesis, distal clavical excision (Right: Shoulder)     Patient location during evaluation: PACU Anesthesia Type: Regional and General Level of consciousness: awake Pain management: pain level controlled Vital Signs Assessment: post-procedure vital signs reviewed and stable Respiratory status: respiratory function stable Cardiovascular status: stable Postop Assessment: no signs of nausea or vomiting Anesthetic complications: no   No notable events documented.  Veda Canning

## 2022-01-01 NOTE — Anesthesia Preprocedure Evaluation (Signed)
Anesthesia Evaluation  Patient identified by MRN, date of birth, ID band Patient awake    Reviewed: Allergy & Precautions, NPO status   History of Anesthesia Complications (+) PONV and history of anesthetic complications  Airway Mallampati: II  TM Distance: >3 FB     Dental   Pulmonary sleep apnea , COPD,    breath sounds clear to auscultation       Cardiovascular hypertension,  Rhythm:Regular Rate:Normal     Neuro/Psych  Headaches, Seizures -,  PSYCHIATRIC DISORDERS Anxiety Depression  Neuromuscular disease CVA    GI/Hepatic hiatal hernia, PUD, GERD  ,S/p gastric bypass   Endo/Other  Hypothyroidism   Renal/GU Renal disease     Musculoskeletal  (+) Arthritis ,   Abdominal   Peds  Hematology   Anesthesia Other Findings   Reproductive/Obstetrics                             Anesthesia Physical  Anesthesia Plan  ASA: 3  Anesthesia Plan: General and Regional   Post-op Pain Management: Regional block   Induction:   PONV Risk Score and Plan: 3 and Ondansetron, TIVA, Midazolam and Treatment may vary due to age or medical condition  Airway Management Planned: Nasal Cannula and Natural Airway  Additional Equipment:   Intra-op Plan:   Post-operative Plan: Possible Post-op intubation/ventilation  Informed Consent: I have reviewed the patients History and Physical, chart, labs and discussed the procedure including the risks, benefits and alternatives for the proposed anesthesia with the patient or authorized representative who has indicated his/her understanding and acceptance.     Dental advisory given  Plan Discussed with: CRNA  Anesthesia Plan Comments:         Anesthesia Quick Evaluation

## 2022-01-01 NOTE — Discharge Instructions (Signed)
Post-Op Instructions - Rotator Cuff Repair  1. Bracing: You will wear a shoulder immobilizer or sling for 6 weeks.   2. Driving: No driving for 3 weeks post-op. When driving, do not wear the immobilizer. Ideally, we recommend no driving for 6 weeks while sling is in place as one arm will be immobilized.   3. Activity: No active lifting for 2 months. Wrist, hand, and elbow motion only. Avoid lifting the upper arm away from the body except for hygiene. You are permitted to bend and straighten the elbow passively only (no active elbow motion). You may use your hand and wrist for typing, writing, and managing utensils (cutting food). Do not lift more than a coffee cup for 8 weeks.  When sleeping or resting, inclined positions (recliner chair or wedge pillow) and a pillow under the forearm for support may provide better comfort for up to 4 weeks.  Avoid long distance travel for 4 weeks.  Return to normal activities after rotator cuff repair repair normally takes 6 months on average. If rehab goes very well, may be able to do most activities at 4 months, except overhead or contact sports.  4. Physical Therapy: Begins 3-4 days after surgery, and proceed 1 time per week for the first 6 weeks, then 1-2 times per week from weeks 6-20 post-op.  5. Medications:  - You will be provided a prescription for narcotic pain medicine. After surgery, take 1-2 narcotic tablets every 4 hours if needed for severe pain.  - A prescription for anti-nausea medication will be provided in case the narcotic medicine causes nausea - take 1 tablet every 6 hours only if nauseated.   - Take tylenol 1000 mg (2 Extra Strength tablets or 3 regular strength) every 8 hours for pain.  May decrease or stop tylenol 5 days after surgery if you are having minimal pain. - Take ASA 325mg/day x 2 weeks to help prevent DVTs/PEs (blood clots).  - DO NOT take ANY nonsteroidal anti-inflammatory pain medications (Advil, Motrin, Ibuprofen, Aleve,  Naproxen, or Naprosyn). These medicines can inhibit healing of your shoulder repair.    If you are taking prescription medication for anxiety, depression, insomnia, muscle spasm, chronic pain, or for attention deficit disorder, you are advised that you are at a higher risk of adverse effects with use of narcotics post-op, including narcotic addiction/dependence, depressed breathing, death. If you use non-prescribed substances: alcohol, marijuana, cocaine, heroin, methamphetamines, etc., you are at a higher risk of adverse effects with use of narcotics post-op, including narcotic addiction/dependence, depressed breathing, death. You are advised that taking > 50 morphine milligram equivalents (MME) of narcotic pain medication per day results in twice the risk of overdose or death. For your prescription provided: oxycodone 5 mg - taking more than 6 tablets per day would result in > 50 morphine milligram equivalents (MME) of narcotic pain medication. Be advised that we will prescribe narcotics short-term, for acute post-operative pain only - 3 weeks for major operations such as shoulder repair/reconstruction surgeries.     6. Post-Op Appointment:  Your first post-op appointment will be 10-14 days post-op.  7. Work or School: For most, but not all procedures, we advise staying out of work or school for at least 1 to 2 weeks in order to recover from the stress of surgery and to allow time for healing.   If you need a work or school note this can be provided.   8. Smoking: If you are a smoker, you need to refrain from   smoking in the postoperative period. The nicotine in cigarettes will inhibit healing of your shoulder repair and decrease the chance of successful repair. Similarly, nicotine containing products (gum, patches) should be avoided.   Post-operative Brace: Apply and remove the brace you received as you were instructed to at the time of fitting and as described in detail as the brace's  instructions for use indicate.  Wear the brace for the period of time prescribed by your physician.  The brace can be cleaned with soap and water and allowed to air dry only.  Should the brace result in increased pain, decreased feeling (numbness/tingling), increased swelling or an overall worsening of your medical condition, please contact your doctor immediately.  If an emergency situation occurs as a result of wearing the brace after normal business hours, please dial 911 and seek immediate medical attention.  Let your doctor know if you have any further questions about the brace issued to you. Refer to the shoulder sling instructions for use if you have any questions regarding the correct fit of your shoulder sling.  [facility] Customer Care for Troubleshooting: [phone]  Video that illustrates how to properly use a shoulder sling: "Instructions for Proper Use of an Orthopaedic Sling" https://www.youtube.com/watch?v=AHZpn_Xo45w     

## 2022-01-02 ENCOUNTER — Encounter: Payer: Self-pay | Admitting: Orthopedic Surgery

## 2022-02-28 ENCOUNTER — Encounter (INDEPENDENT_AMBULATORY_CARE_PROVIDER_SITE_OTHER): Payer: Self-pay

## 2022-04-24 ENCOUNTER — Other Ambulatory Visit: Payer: Self-pay | Admitting: Orthopedic Surgery

## 2022-04-24 DIAGNOSIS — M25311 Other instability, right shoulder: Secondary | ICD-10-CM

## 2022-04-25 ENCOUNTER — Other Ambulatory Visit: Payer: Self-pay | Admitting: Orthopedic Surgery

## 2022-04-25 DIAGNOSIS — M5412 Radiculopathy, cervical region: Secondary | ICD-10-CM

## 2022-04-25 DIAGNOSIS — M5030 Other cervical disc degeneration, unspecified cervical region: Secondary | ICD-10-CM

## 2022-05-04 ENCOUNTER — Ambulatory Visit
Admission: RE | Admit: 2022-05-04 | Discharge: 2022-05-04 | Disposition: A | Discharge status: Home / Self Care | Payer: 59 | Source: Ambulatory Visit | Attending: Orthopedic Surgery | Admitting: Orthopedic Surgery

## 2022-05-04 DIAGNOSIS — M5030 Other cervical disc degeneration, unspecified cervical region: Secondary | ICD-10-CM

## 2022-05-04 DIAGNOSIS — M5412 Radiculopathy, cervical region: Secondary | ICD-10-CM

## 2022-05-04 DIAGNOSIS — M25311 Other instability, right shoulder: Secondary | ICD-10-CM

## 2022-05-21 ENCOUNTER — Encounter (INDEPENDENT_AMBULATORY_CARE_PROVIDER_SITE_OTHER): Payer: Self-pay

## 2022-08-10 ENCOUNTER — Other Ambulatory Visit: Payer: Self-pay | Admitting: Orthopedic Surgery

## 2022-08-10 DIAGNOSIS — M25311 Other instability, right shoulder: Secondary | ICD-10-CM

## 2022-08-11 ENCOUNTER — Other Ambulatory Visit: Payer: Self-pay

## 2022-08-11 ENCOUNTER — Emergency Department: Payer: Medicaid Other

## 2022-08-11 ENCOUNTER — Ambulatory Visit
Admission: RE | Admit: 2022-08-11 | Discharge: 2022-08-11 | Disposition: A | Discharge status: Home / Self Care | Payer: Medicaid Other | Source: Ambulatory Visit | Attending: Orthopedic Surgery | Admitting: Orthopedic Surgery

## 2022-08-11 ENCOUNTER — Encounter: Payer: Self-pay | Admitting: Emergency Medicine

## 2022-08-11 ENCOUNTER — Emergency Department
Admission: EM | Admit: 2022-08-11 | Discharge: 2022-08-11 | Disposition: A | Discharge status: Home / Self Care | Payer: Medicaid Other | Attending: Emergency Medicine | Admitting: Emergency Medicine

## 2022-08-11 DIAGNOSIS — J449 Chronic obstructive pulmonary disease, unspecified: Secondary | ICD-10-CM | POA: Diagnosis not present

## 2022-08-11 DIAGNOSIS — W010XXA Fall on same level from slipping, tripping and stumbling without subsequent striking against object, initial encounter: Secondary | ICD-10-CM | POA: Insufficient documentation

## 2022-08-11 DIAGNOSIS — M25311 Other instability, right shoulder: Secondary | ICD-10-CM | POA: Diagnosis present

## 2022-08-11 DIAGNOSIS — M25532 Pain in left wrist: Secondary | ICD-10-CM | POA: Diagnosis present

## 2022-08-11 NOTE — ED Triage Notes (Signed)
Pt via POV from home. Pt c/o L wrist pain into the L shoulder. Pt states she fell on Wednesday. Pt is A&Ox4 and NAD

## 2022-08-11 NOTE — Discharge Instructions (Signed)
Your x-ray does not show any fractures.  You may wear the splint to help with your pain and also in case there is an occult scaphoid fracture as we discussed.  Given the location of your pain, please follow orthopedics.  Return for any new, worsening, or change in symptoms or other concerns.

## 2022-08-11 NOTE — ED Provider Notes (Signed)
[facility] Provider Note    Event Date/Time   First MD Initiated Contact with Patient 08/11/22 [phone]     (approximate)   History   Fall and Wrist Pain   HPI  Tammy Young is a 57 y.o. female with a past medical history of morbid obesity, COPD, hyperlipidemia presents today for evaluation of left wrist pain after a fall that occurred on Wednesday.  Patient reports that she went to sit down on a chair but missed the chair and fell onto her left outstretched hand.  She denies head strike or LOC.  She reports that she has continued to have pain in her wrist ever since.  She denies paresthesias or weakness.  No other injury sustained.  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 12/15/2019   Chronic pain syndrome 10/22/2019   Chronic bilateral low back pain without sciatica 10/22/2019   SI (sacroiliac) joint inflammation ([place]) 10/22/2019   Chronic SI joint pain 10/22/2019   Insect bite of left elbow 10/06/2019   Pruritic rash 09/11/2019   Osteopenia of multiple sites 08/31/2019   Swelling of limb 08/28/2019   Pain in limb 08/28/2019   Varicose veins of leg with pain, right 08/20/2019   Stroke-like symptoms 08/20/2019   Bilateral hip pain 08/20/2019   Prediabetes 08/18/2019   Vitamin B12 deficiency 08/18/2019   Vitamin D deficiency 08/18/2019   Lymphedema of right lower extremity 08/14/2019   Stress incontinence in female 08/14/2019   Recurrent major depressive disorder, in partial remission ([place]) 01/14/2018   Obstructive sleep apnea syndrome 10/18/2017   Sialadenitis 10/17/2017   SOB (shortness of breath) 09/15/2017   Chronic cough 08/01/2017   Obstructive uropathy 10/08/2016   Ureterolithiasis    Acute right flank pain    Intractable pain    Chronic renal disease, stage 3,  moderately decreased glomerular filtration rate between 30-59 mL/min/1.73 square meter ([place]) 07/02/2016   Essential hypertension 06/22/2016   Intractable chronic migraine without aura and with status migrainosus 12/02/2015   Chronic daily headache 11/16/2015   Tension headache 11/16/2015   Chronic pansinusitis 10/24/2015   Chronic tension-type headache, intractable 10/24/2015   Hemorrhoids 10/24/2015   Hyperlipidemia, unspecified 10/24/2015   Herpesviral vulvovaginitis 11/17/2014   Multiple gastric ulcers 04/16/2014   Obesity, Class III, BMI 40-49.9 (morbid obesity) ([place]) 03/17/2014   Epigastric pain 03/17/2014   Fatty (change of) liver, not elsewhere classified 03/17/2014   Gastroesophageal reflux disease 03/17/2014   Hypothyroidism, unspecified 10/23/2012   Thoracic back pain 10/23/2012   Myalgia, other site 10/08/2012   Calculus of kidney 05/30/2011   Carpal tunnel syndrome 05/30/2011          Physical Exam   Triage Vital Signs: ED Triage Vitals [08/11/22 0844]  Enc Vitals Group     BP 127/81     Pulse Rate 62     Resp 18     Temp 98.1 F (36.7 C)     Temp Source Oral     SpO2 98 %     Weight 149 lb (67.6 kg)     Height '5\' 4"'$  (1.626 m)     Head Circumference      Peak Flow      Pain Score 2     Pain Loc      Pain Edu?  Excl. in [place]?     Most recent vital signs: Vitals:   08/11/22 0844  BP: 127/81  Pulse: 62  Resp: 18  Temp: 98.1 F (36.7 C)  SpO2: 98%    Physical Exam Vitals and nursing note reviewed.  Constitutional:      General: Awake and alert. No acute distress.    Appearance: Normal appearance. The patient is normal weight.  HENT:     Head: Normocephalic and atraumatic.     Mouth: Mucous membranes are moist.  Eyes:     General: PERRL. Normal EOMs        Right eye: No discharge.        Left eye: No discharge.     Conjunctiva/sclera: Conjunctivae normal.  Cardiovascular:     Rate and Rhythm: Normal rate and regular rhythm.      Pulses: Normal pulses.  Pulmonary:     Effort: Pulmonary effort is normal. No respiratory distress.     Breath sounds: Normal breath sounds.  Abdominal:     Abdomen is soft. There is no abdominal tenderness. No rebound or guarding. No distention. Musculoskeletal:        General: No swelling. Normal range of motion.     Cervical back: Normal range of motion and neck supple.  Left wrist: No obvious deformity, erythema, ecchymosis, or swelling noted.  No open wounds.  Able to supinate and pronate against resistance with minimal discomfort at the level of the wrist.  Mild tenderness to palpation to the radial aspect of wrist.  Mild snuffbox tenderness.  Normal intrinsic muscle function of the hand.  Normal radial pulse.  Compartment soft and compressible.  No tenderness to the forearm, elbow, or humerus.  Normal and full range of motion at the level of the shoulder, elbow, wrist. Skin:    General: Skin is warm and dry.     Capillary Refill: Capillary refill takes less than 2 seconds.     Findings: No rash.  Neurological:     Mental Status: The patient is awake and alert.      ED Results / Procedures / Treatments   Labs (all labs ordered are listed, but only abnormal results are displayed) Labs Reviewed - No data to display   EKG     RADIOLOGY I independently reviewed and interpreted imaging and agree with radiologists findings.     PROCEDURES:  Critical Care performed:   Procedures   MEDICATIONS ORDERED IN ED: Medications - No data to display   IMPRESSION / MDM / [place] / ED COURSE  I reviewed the triage vital signs and the nursing notes.   Differential diagnosis includes, but is not limited to, fracture, contusion, sprain.  Patient is awake and alert, hemodynamically stable and neurovascularly intact.  X-ray does not reveal any acute fractures.  However, patient does have minimal tenderness at her snuffbox.  We discussed the possibility of occult scaphoid  fracture.  Patient was placed in a thumb spica splint and instructed to follow-up with orthopedics.  She declined analgesia.  We discussed return precautions and the importance with outpatient follow-up.  Patient understands and agrees with plan.  She was discharged in stable condition.   Patient's presentation is most consistent with acute complicated illness / injury requiring diagnostic workup.     FINAL CLINICAL IMPRESSION(S) / ED DIAGNOSES   Final diagnoses:  Left wrist pain     Rx / DC Orders   ED Discharge Orders     None  Note:  This document was prepared using Dragon voice recognition software and may include unintentional dictation errors.   Emeline Gins 08/11/22 1155    Blake Divine, MD 08/11/22 1432

## 2022-08-23 ENCOUNTER — Ambulatory Visit: Payer: Self-pay | Admitting: Licensed Clinical Social Worker
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
## 2022-09-18 ENCOUNTER — Encounter: Payer: Self-pay | Admitting: Psychiatry

## 2022-09-18 ENCOUNTER — Ambulatory Visit: Payer: Medicaid Other | Admitting: Psychiatry

## 2022-09-18 VITALS — BP 134/82 | HR 68 | Temp 97.3°F | Ht 64.0 in | Wt 156.0 lb

## 2022-09-18 DIAGNOSIS — F4310 Post-traumatic stress disorder, unspecified: Secondary | ICD-10-CM

## 2022-09-18 DIAGNOSIS — F332 Major depressive disorder, recurrent severe without psychotic features: Secondary | ICD-10-CM

## 2022-09-18 MED ORDER — ARIPIPRAZOLE 2 MG PO TABS: 2.0000 mg | ORAL_TABLET | Freq: Every day | ORAL | 2 refills | Status: DC

## 2022-09-18 NOTE — Patient Instructions (Signed)
Continue fluoxetine 40 mg daily Hold olanzapine  Start Abilify 2 mg at night  Obtain EKG at the next visit  Continue Clonazepam 0.5 mg twice a day  Next appointment: 4/29 at 1:30

## 2022-09-24 ENCOUNTER — Telehealth: Payer: Self-pay | Admitting: Psychiatry

## 2022-09-24 DIAGNOSIS — F332 Major depressive disorder, recurrent severe without psychotic features: Secondary | ICD-10-CM

## 2022-09-24 MED ORDER — FLUOXETINE HCL 20 MG PO CAPS: 20.0000 mg | ORAL_CAPSULE | Freq: Every day | ORAL | 0 refills | Status: DC

## 2022-09-24 NOTE — Telephone Encounter (Signed)
Contacted patient to discuss her concerns.  Patient reports she was previously on a lower dosage of fluoxetine however her dosage was increased to 40 mg recently and Abilify was added since she had side effects to olanzapine.  Patient reports since being on the higher dosage of fluoxetine and the combination of Abilify she has been shaky, jittery and restless.  Patient reports she does not know what could be contributing to it however believes it could be the fluoxetine.  She does have fluoxetine 10 mg available at home.  Agreeable to reduce the dosage of fluoxetine to a 20 mg daily for the next few days and monitor herself to see if her symptoms are getting better.  Patient advised to hold the Abilify if lowering of fluoxetine does not work.  Discussed with patient that I will forward this message to staff here to schedule this patient with Saturday clinic since she needs to be evaluated for further medication changes soon.  Patient currently reports although she cares less if she is not alive today , denies any active suicidality at this time.

## 2022-09-24 NOTE — Telephone Encounter (Signed)
Please check with patient if her shaking started after the addition of Abilify. If so there are 2 options.  One is she could stop the Abilify altogether and monitor herself just on the Prozac.  The other option will be reducing the Abilify to just half a tablet daily and see if the shakiness gets better or not.  If she does not believe the Abilify is the cause of the shakiness and if she had the shakiness even before the trial of Abilify.  Then we could reduce the Prozac to a lower dosage.  Please let me know what she wants to do and go from there.

## 2022-09-24 NOTE — Telephone Encounter (Signed)
Patient has called in for a couple reasons. First wants to switch providers, she thinks there is a language barrier or the mask made it hard for communication. She tried to ask about coming off of Prozac but doctor gave her abilify to go with it. She feels like she is having side effects from the Prozac, constantly shaking. Please advise.

## 2022-09-24 NOTE — Telephone Encounter (Signed)
Called patient back to make her aware of the message from Dr Shea Evans no answer left voicemail for patient to return call to office

## 2022-09-24 NOTE — Telephone Encounter (Signed)
Patient returned call to office  gave her the instructions from Dr Shea Evans she voiced understanding

## 2022-09-24 NOTE — Telephone Encounter (Signed)
Spoke to patient she stated that this all started with the increase of the Prozac and Clonazepam she stated that she does not think it has anything to do with the Abilify she is experiencing movement of the hands she can not sit still and it feels like she is coming off of a high or drunk she also stated that she tried to discuss with provider but she thinks there may be a language barrier please advise

## 2022-09-25 NOTE — Telephone Encounter (Signed)
Patient has been scheduled for Saturday clinic with Dr De Nurse on 10/06/22 @ 11:00 am made patient aware

## 2022-09-25 NOTE — Telephone Encounter (Signed)
Noted  

## 2022-10-06 ENCOUNTER — Ambulatory Visit (HOSPITAL_BASED_OUTPATIENT_CLINIC_OR_DEPARTMENT_OTHER): Payer: Medicaid Other | Admitting: Psychiatry

## 2022-10-06 ENCOUNTER — Encounter (HOSPITAL_COMMUNITY): Payer: Self-pay | Admitting: Psychiatry

## 2022-10-06 DIAGNOSIS — F4310 Post-traumatic stress disorder, unspecified: Secondary | ICD-10-CM

## 2022-10-06 DIAGNOSIS — F332 Major depressive disorder, recurrent severe without psychotic features: Secondary | ICD-10-CM | POA: Diagnosis not present

## 2022-10-06 MED ORDER — HYDROXYZINE HCL 10 MG PO TABS: 10.0000 mg | ORAL_TABLET | Freq: Two times a day (BID) | ORAL | 0 refills | Status: DC | PRN

## 2022-10-06 MED ORDER — FLUOXETINE HCL 40 MG PO CAPS: 40.0000 mg | ORAL_CAPSULE | Freq: Every day | ORAL | 0 refills | Status: DC
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
## 2022-10-11 ENCOUNTER — Other Ambulatory Visit: Payer: Self-pay | Admitting: Psychiatry

## 2022-10-12 ENCOUNTER — Other Ambulatory Visit: Payer: Self-pay | Admitting: Gerontology

## 2022-10-12 DIAGNOSIS — Z1231 Encounter for screening mammogram for malignant neoplasm of breast: Secondary | ICD-10-CM

## 2022-10-22 ENCOUNTER — Encounter: Payer: Self-pay | Admitting: Psychiatry

## 2022-10-22 ENCOUNTER — Ambulatory Visit (INDEPENDENT_AMBULATORY_CARE_PROVIDER_SITE_OTHER): Payer: Medicaid Other | Admitting: Psychiatry

## 2022-10-22 ENCOUNTER — Ambulatory Visit
Admission: RE | Admit: 2022-10-22 | Discharge: 2022-10-22 | Disposition: A | Discharge status: Home / Self Care | Payer: Medicaid Other | Source: Ambulatory Visit | Attending: Psychiatry | Admitting: Psychiatry

## 2022-10-22 VITALS — BP 125/76 | HR 62 | Temp 97.5°F | Ht 64.5 in | Wt 160.0 lb

## 2022-10-22 DIAGNOSIS — F331 Major depressive disorder, recurrent, moderate: Secondary | ICD-10-CM | POA: Diagnosis not present

## 2022-10-22 DIAGNOSIS — F4310 Post-traumatic stress disorder, unspecified: Secondary | ICD-10-CM | POA: Diagnosis not present
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
## 2022-10-30 ENCOUNTER — Ambulatory Visit
Admission: RE | Admit: 2022-10-30 | Discharge: 2022-10-30 | Disposition: A | Discharge status: Home / Self Care | Payer: Medicaid Other | Source: Ambulatory Visit | Attending: Gerontology | Admitting: Gerontology

## 2022-10-30 ENCOUNTER — Telehealth (HOSPITAL_COMMUNITY): Payer: Self-pay

## 2022-10-30 ENCOUNTER — Other Ambulatory Visit: Payer: Self-pay | Admitting: Psychiatry

## 2022-10-30 DIAGNOSIS — Z1231 Encounter for screening mammogram for malignant neoplasm of breast: Secondary | ICD-10-CM | POA: Diagnosis present

## 2022-10-30 MED ORDER — HYDROXYZINE HCL 25 MG PO TABS: 25.0000 mg | ORAL_TABLET | Freq: Every day | ORAL | 0 refills | Status: DC | PRN

## 2022-10-30 NOTE — Telephone Encounter (Signed)
Ordered

## 2022-10-30 NOTE — Telephone Encounter (Signed)
Medication refill request - Fax from patient's [facility] on [address], [place], [place] requesting a 90 day order for her prescribed Hydroxyzine 10 mg, last ordered for twice a day as needed, #60 on 10/06/22. Pt returns 12/13/22.

## 2022-10-30 NOTE — Telephone Encounter (Signed)
Could you please discuss with her.  The last time we talked about increasing hydroxyzine to 25 mg capsule once daily as needed for anxiety. I overlooked placing the order at that time. Would she prefer to continue with the current regimen of 10 mg twice a day, or is she open to trying 25 mg daily as needed? Thanks.

## 2022-10-30 NOTE — Telephone Encounter (Signed)
Medication management - Telephone call with patient to discuss her current and recently discussed potential to go up on her Hydroxyzine to 25 mg, once a day with Dr.Hisada. Patient stated that is what she would still like to do and requested Dr. Vanetta Shawl send in a new Hydroxyzine 25 mg, one a day as needed, #90 order to [facility] [place].  Agreed to send request back to Dr. Vanetta Shawl.

## 2022-11-05 ENCOUNTER — Other Ambulatory Visit: Payer: Self-pay | Admitting: Physical Medicine & Rehabilitation

## 2022-11-05 DIAGNOSIS — M542 Cervicalgia: Secondary | ICD-10-CM

## 2022-11-05 NOTE — Progress Notes (Unsigned)
Referring Physician:  Elijah Birk, MD [address] [place],  [place] [address]  Primary Physician:  Luciana Axe, NP  History of Present Illness: 11/06/2022 Tammy Young is here today with a chief complaint of neck and arm pain.  She has pain in between her shoulder blades.  It is worse on the right than the left.  She gets numbness and tingling into her right forearm and first 3 digits of her right hand.  She had rotator cuff repair in June 2023 and has since continued to have pain in a similar distribution.  She reports pain with housework, bathing herself, and trying to get out of bed.  Nothing really helps.  She has tried injections without improvement.  She did 3 months of physical therapy last summer.  Bowel/Bladder Dysfunction: none  Conservative measures:  Physical therapy:  has participated at [facility] for right shoulder and right arm weakness 01/04/22-03/20/22 Multimodal medical therapy including regular antiinflammatories:  tramadol, celebrex, gabapentin Injections:  has received epidural steroid injections 10/11/22: right C6-7 TFESI by Dr Mariah Milling - no significant relief 05/23/22: right C5-6 TFESI by Dr Mariah Milling - no improvement  Past Surgery: no previous spine surgery  Jalen MALACHI SUDERMAN has no symptoms of cervical myelopathy.  The symptoms are causing a significant impact on the patient's life.   I have utilized the care everywhere function in epic to review the outside records available from external health systems.  Review of Systems:  A 10 point review of systems is negative, except for the pertinent positives and negatives detailed in the HPI.  Past Medical History: Past Medical History:  Diagnosis Date   Anxiety and depression    Arthritis    Chronic constipation    Chronic cough    Chronic kidney disease (CKD)    stage 3   Collagen vascular disease    RA   CTEPH (chronic thromboembolic pulmonary hypertension)    Fatty liver  disease, nonalcoholic    GERD (gastroesophageal reflux disease)    Headache    Hemorrhoids    Herpes simplex vulvovaginitis    Hiatal hernia    History of kidney stones    History of stomach ulcers    HLD (hyperlipidemia)    Hypertension    Resolved after wt loss   Hypothyroidism, unspecified 10/23/2012   Liver disease    Lymphedema of right lower extremity    Morbid obesity    Multiple gastric ulcers    Multiple gastric ulcers    Obstructive sleep apnea syndrome 10/18/2017   Personal history of COVID-19 12/2020   PONV (postoperative nausea and vomiting)    Pre-diabetes    Pulmonary embolism    HEART CATH DONE 12/2020 AND MRI DONE - PULMONARY EMBOLIS - GONE   Renal disorder    kidney stones and shortened ureter repaired with surgery as a child.   Seizures    only had with preeclampsia during pregnancy at age 72. none since   Stress incontinence (female) (female)    Stroke    Identified on CT.  No deficits   Vitamin B 12 deficiency    Vitamin D deficiency     Past Surgical History: Past Surgical History:  Procedure Laterality Date   BREAST SURGERY     reduction   CARPAL TUNNEL RELEASE Bilateral    CHOLECYSTECTOMY     CYSTOSCOPY WITH URETEROSCOPY, STONE BASKETRY AND STENT PLACEMENT  10/09/2016   Procedure: CYSTOSCOPY WITH URETEROSCOPY, STONE BASKETRY AND STENT PLACEMENT;  Surgeon: Vanna Scotland, MD;  Location: [facility];  Service: Urology;;   DIRECT LARYNGOSCOPY Left 04/15/2015   Procedure: DIRECT LARYNGOSCOPY BIOPSY AND LASER LEFT TONGUE LESION;  Surgeon: Suzanna Obey, MD;  Location: [facility];  Service: ENT;  Laterality: Left;   ESOPHAGOGASTRODUODENOSCOPY (EGD) WITH PROPOFOL N/A 09/03/2019   Procedure: ESOPHAGOGASTRODUODENOSCOPY (EGD) WITH PROPOFOL;  Surgeon: Toledo, Boykin Nearing, MD;  Location: [facility];  Service: Gastroenterology;  Laterality: N/A;   ESOPHAGOGASTRODUODENOSCOPY (EGD) WITH PROPOFOL N/A 02/03/2021   Procedure: ESOPHAGOGASTRODUODENOSCOPY  (EGD) WITH PROPOFOL;  Surgeon: Regis Bill, MD;  Location: [facility];  Service: Endoscopy;  Laterality: N/A;  ELIQUIS   GASTRIC ROUX-EN-Y N/A 04/04/2021   Procedure: LAPAROSCOPIC ROUX-EN-Y GASTRIC BYPASS WITH UPPER ENDOSCOPY;  Surgeon: Berna Bue, MD;  Location: [facility];  Service: General;  Laterality: N/A;   LEFT HEART CATH     REDUCTION MAMMAPLASTY     RIGHT HEART CATH N/A 06/02/2020   Procedure: RIGHT HEART CATH;  Surgeon: Alwyn Pea, MD;  Location: [facility];  Service: Cardiovascular;  Laterality: N/A;   SHOULDER ARTHROSCOPY Right 01/01/2022   Procedure: Right shoulder arthroscopic rotator cuff repair, subscapularis repaire, subpectoral biceps tenodesis, distal clavical excision;  Surgeon: Signa Kell, MD;  Location: [facility];  Service: Orthopedics;  Laterality: Right;   TONSILLECTOMY     UPPER GI ENDOSCOPY N/A 04/04/2021   Procedure: UPPER GI ENDOSCOPY;  Surgeon: Berna Bue, MD;  Location: [facility];  Service: General;  Laterality: N/A;   URETER SURGERY     VAGINAL HYSTERECTOMY  2000    Allergies: Allergies as of 11/06/2022 - Review Complete 11/06/2022  Allergen Reaction Noted   Bee venom Other (See Comments) 04/04/2021   Amoxicillin Rash 09/03/2019   Augmentin [amoxicillin-pot clavulanate] Itching and Rash 10/03/2018    Medications:  Current Outpatient Medications:    ARIPiprazole (ABILIFY) 2 MG tablet, Take 1 tablet (2 mg total) by mouth at bedtime., Disp: 30 tablet, Rfl: 2   CALCIUM PO, Take by mouth daily., Disp: , Rfl:    Cyanocobalamin (VITAMIN B-12 IJ), Inject 1,000 mcg as directed every 30 (thirty) days., Disp: , Rfl:    ergocalciferol (VITAMIN D2) 1.25 MG (50000 UT) capsule, Take 50,000 Units by mouth once a week., Disp: , Rfl:    FLUoxetine (PROZAC) 40 MG capsule, Take 1 capsule (40 mg total) by mouth daily., Disp: 30 capsule, Rfl: 0   hydrOXYzine (ATARAX) 25 MG tablet, Take 1 tablet (25 mg total) by mouth daily as  needed for anxiety., Disp: 90 tablet, Rfl: 0   Multiple Vitamins-Minerals (BARIATRIC MULTIVITAMINS/IRON PO), Take by mouth daily., Disp: , Rfl:    ondansetron (ZOFRAN-ODT) 4 MG disintegrating tablet, Dissolve 1 tablet (4 mg total) by mouth every 6 (six) hours as needed for nausea or vomiting., Disp: 20 tablet, Rfl: 0   ondansetron (ZOFRAN-ODT) 4 MG disintegrating tablet, Take 1 tablet (4 mg total) by mouth every 8 (eight) hours as needed for nausea or vomiting., Disp: 20 tablet, Rfl: 0   pantoprazole (PROTONIX) 40 MG tablet, Take 1 tablet (40 mg total) by mouth daily. (Patient taking differently: Take 20 mg by mouth.), Disp: 90 tablet, Rfl: 0   SUMAtriptan (IMITREX) 100 MG tablet, Take 100 mg by mouth every 2 (two) hours as needed for migraine., Disp: , Rfl:    traMADol (ULTRAM) 50 MG tablet, Take 1 tablet (50 mg total) by mouth every 6 (six) hours as needed (pain)., Disp: 10 tablet, Rfl: 0   valACYclovir (  VALTREX) 500 MG tablet, Take 500 mg by mouth 2 (two) times daily as needed (outbreak). , Disp: , Rfl:    albuterol (PROVENTIL) (2.5 MG/3ML) 0.083% nebulizer solution, Inhale 2.5 mg into the lungs every 4 (four) hours as needed for wheezing or shortness of breath. , Disp: , Rfl:  No current facility-administered medications for this visit.  Facility-Administered Medications Ordered in Other Visits:    gadopentetate dimeglumine (MAGNEVIST) injection 20 mL, 20 mL, Intravenous, Once PRN, Pearlean Brownie, Jason Fila, MD   sodium chloride flush (NS) 0.9 % injection 3 mL, 3 mL, Intravenous, Q12H, Fath, Darlin Priestly, MD  Social History: Social History   Tobacco Use   Smoking status: Never    Passive exposure: Yes   Smokeless tobacco: Never   Tobacco comments:    mother smokes outside/ mother deceased  Vaping Use   Vaping Use: Never used  Substance Use Topics   Alcohol use: Never    Comment: rarely   Drug use: No    Family Medical History: Family History  Problem Relation Age of Onset   Bipolar disorder  Mother    Kidney Stones Mother    Diabetes Mother    COPD Mother    Drug abuse Mother    Alcohol abuse Father    Kidney Stones Father    Prostate cancer Maternal Grandfather    Migraines Maternal Grandmother    Stroke Maternal Grandmother    Diabetes Maternal Grandmother    Kidney cancer Neg Hx    Bladder Cancer Neg Hx     Physical Examination: Vitals:   11/06/22 1300  BP: 130/75  Pulse: 65  SpO2: 99%    General: Patient is well developed, well nourished, calm, collected, and in no apparent distress. Attention to examination is appropriate.  Neck:   Supple.  Full range of motion with significant discomfort on rotation to the right.  Respiratory: Patient is breathing without any difficulty.   NEUROLOGICAL:     Awake, alert, oriented to person, place, and time.  Speech is clear and fluent.   Cranial Nerves: Pupils equal round and reactive to light.  Facial tone is symmetric.  Facial sensation is symmetric. Shoulder shrug is symmetric. Tongue protrusion is midline.  There is no pronator drift.  ROM of spine: full.    Strength: Side Biceps Triceps Deltoid Interossei Grip Wrist Ext. Wrist Flex.  R 4- L Side Iliopsoas Quads Hamstring PF DF EHL  R L Reflexes are 1+ and symmetric at the biceps, triceps, brachioradialis, patella and achilles.   Hoffman's is absent.   Bilateral upper and lower extremity sensation is intact to light touch with exception of R C6 distribution which is diminished.    No evidence of dysmetria noted.  Gait is normal.     Medical Decision Making  Imaging: MRI C spine 05/04/2022 IMPRESSION: 1. Subtle retrolisthesis at C5-C6 with bulky disc osteophyte complex, mild facet and ligament flavum hypertrophy. Subsequent mild spinal stenosis and spinal cord mass effect. No cord signal abnormality. Severe left and moderate to severe right C6 neural foraminal stenosis. 2. Mild multifactorial  spinal stenosis at C6-C7 with no cord mass effect. Moderate left and moderate to severe right C7 foraminal stenosis. 3. C4-C5 borderline to mild spinal stenosis with up to moderate bilateral C5 foraminal stenosis.     Electronically Signed  By: Odessa Fleming M.D.   On: 05/06/2022 07:42  I have personally reviewed the images and agree with the above interpretation.  Assessment and Plan: Ms. Wallock is a pleasant 57 y.o. female with right-sided C6 and C7 radiculopathies with weakness in a similar distribution.  She has tried and failed conservative management.  She did 3 months of physical therapy last summer.  She is also done injections and medications without improvement.  At this point, no further conservative management is indicated.  I recommended C5-7 anterior cervical discectomy and fusion.  I discussed the planned procedure at length with the patient, including the risks, benefits, alternatives, and indications. The risks discussed include but are not limited to bleeding, infection, need for reoperation, spinal fluid leak, stroke, vision loss, anesthetic complication, coma, paralysis, and even death. We also discussed the possibility of post-operative dysphagia, vocal cord paralysis, and the risk of adjacent segment disease in the future. I also described in detail that improvement was not guaranteed.  The patient expressed understanding of these risks, and asked that we proceed with surgery. I described the surgery in layman's terms, and gave ample opportunity for questions, which were answered to the best of my ability.  I spent a total of 30 minutes in this patient's care today. This time was spent reviewing pertinent records including imaging studies, obtaining and confirming history, performing a directed evaluation, formulating and discussing my recommendations, and documenting the visit within the medical record.     Thank you for involving me in the care of this patient.       Noal Abshier K. Myer Haff MD, [facility]

## 2022-11-05 NOTE — H&P (View-Only) (Signed)
Referring Physician:  Elijah Birk, MD [address] [place],  [place] [address]  Primary Physician:  Luciana Axe, NP  History of Present Illness: 11/06/2022 Tammy Young is here today with a chief complaint of neck and arm pain.  She has pain in between her shoulder blades.  It is worse on the right than the left.  She gets numbness and tingling into her right forearm and first 3 digits of her right hand.  She had rotator cuff repair in June 2023 and has since continued to have pain in a similar distribution.  She reports pain with housework, bathing herself, and trying to get out of bed.  Nothing really helps.  She has tried injections without improvement.  She did 3 months of physical therapy last summer.  Bowel/Bladder Dysfunction: none  Conservative measures:  Physical therapy:  has participated at [facility] for right shoulder and right arm weakness 01/04/22-03/20/22 Multimodal medical therapy including regular antiinflammatories:  tramadol, celebrex, gabapentin Injections:  has received epidural steroid injections 10/11/22: right C6-7 TFESI by Dr Mariah Milling - no significant relief 05/23/22: right C5-6 TFESI by Dr Mariah Milling - no improvement  Past Surgery: no previous spine surgery  Tammy Young has no symptoms of cervical myelopathy.  The symptoms are causing a significant impact on the patient's life.   I have utilized the care everywhere function in epic to review the outside records available from external health systems.  Review of Systems:  A 10 point review of systems is negative, except for the pertinent positives and negatives detailed in the HPI.  Past Medical History: Past Medical History:  Diagnosis Date   Anxiety and depression    Arthritis    Chronic constipation    Chronic cough    Chronic kidney disease (CKD)    stage 3   Collagen vascular disease    RA   CTEPH (chronic thromboembolic pulmonary hypertension)    Fatty liver  disease, nonalcoholic    GERD (gastroesophageal reflux disease)    Headache    Hemorrhoids    Herpes simplex vulvovaginitis    Hiatal hernia    History of kidney stones    History of stomach ulcers    HLD (hyperlipidemia)    Hypertension    Resolved after wt loss   Hypothyroidism, unspecified 10/23/2012   Liver disease    Lymphedema of right lower extremity    Morbid obesity    Multiple gastric ulcers    Multiple gastric ulcers    Obstructive sleep apnea syndrome 10/18/2017   Personal history of COVID-19 12/2020   PONV (postoperative nausea and vomiting)    Pre-diabetes    Pulmonary embolism    HEART CATH DONE 12/2020 AND MRI DONE - PULMONARY EMBOLIS - GONE   Renal disorder    kidney stones and shortened ureter repaired with surgery as a child.   Seizures    only had with preeclampsia during pregnancy at age 31. none since   Stress incontinence (female) (female)    Stroke    Identified on CT.  No deficits   Vitamin B 12 deficiency    Vitamin D deficiency     Past Surgical History: Past Surgical History:  Procedure Laterality Date   BREAST SURGERY     reduction   CARPAL TUNNEL RELEASE Bilateral    CHOLECYSTECTOMY     CYSTOSCOPY WITH URETEROSCOPY, STONE BASKETRY AND STENT PLACEMENT  10/09/2016   Procedure: CYSTOSCOPY WITH URETEROSCOPY, STONE BASKETRY AND STENT PLACEMENT;  Surgeon: Vanna Scotland, MD;  Location: [facility];  Service: Urology;;   DIRECT LARYNGOSCOPY Left 04/15/2015   Procedure: DIRECT LARYNGOSCOPY BIOPSY AND LASER LEFT TONGUE LESION;  Surgeon: Suzanna Obey, MD;  Location: [facility];  Service: ENT;  Laterality: Left;   ESOPHAGOGASTRODUODENOSCOPY (EGD) WITH PROPOFOL N/A 09/03/2019   Procedure: ESOPHAGOGASTRODUODENOSCOPY (EGD) WITH PROPOFOL;  Surgeon: Toledo, Boykin Nearing, MD;  Location: [facility];  Service: Gastroenterology;  Laterality: N/A;   ESOPHAGOGASTRODUODENOSCOPY (EGD) WITH PROPOFOL N/A 02/03/2021   Procedure: ESOPHAGOGASTRODUODENOSCOPY  (EGD) WITH PROPOFOL;  Surgeon: Regis Bill, MD;  Location: [facility];  Service: Endoscopy;  Laterality: N/A;  ELIQUIS   GASTRIC ROUX-EN-Y N/A 04/04/2021   Procedure: LAPAROSCOPIC ROUX-EN-Y GASTRIC BYPASS WITH UPPER ENDOSCOPY;  Surgeon: Berna Bue, MD;  Location: [facility];  Service: General;  Laterality: N/A;   LEFT HEART CATH     REDUCTION MAMMAPLASTY     RIGHT HEART CATH N/A 06/02/2020   Procedure: RIGHT HEART CATH;  Surgeon: Alwyn Pea, MD;  Location: [facility];  Service: Cardiovascular;  Laterality: N/A;   SHOULDER ARTHROSCOPY Right 01/01/2022   Procedure: Right shoulder arthroscopic rotator cuff repair, subscapularis repaire, subpectoral biceps tenodesis, distal clavical excision;  Surgeon: Signa Kell, MD;  Location: [facility];  Service: Orthopedics;  Laterality: Right;   TONSILLECTOMY     UPPER GI ENDOSCOPY N/A 04/04/2021   Procedure: UPPER GI ENDOSCOPY;  Surgeon: Berna Bue, MD;  Location: [facility];  Service: General;  Laterality: N/A;   URETER SURGERY     VAGINAL HYSTERECTOMY  2000    Allergies: Allergies as of 11/06/2022 - Review Complete 11/06/2022  Allergen Reaction Noted   Bee venom Other (See Comments) 04/04/2021   Amoxicillin Rash 09/03/2019   Augmentin [amoxicillin-pot clavulanate] Itching and Rash 10/03/2018    Medications:  Current Outpatient Medications:    ARIPiprazole (ABILIFY) 2 MG tablet, Take 1 tablet (2 mg total) by mouth at bedtime., Disp: 30 tablet, Rfl: 2   CALCIUM PO, Take by mouth daily., Disp: , Rfl:    Cyanocobalamin (VITAMIN B-12 IJ), Inject 1,000 mcg as directed every 30 (thirty) days., Disp: , Rfl:    ergocalciferol (VITAMIN D2) 1.25 MG (50000 UT) capsule, Take 50,000 Units by mouth once a week., Disp: , Rfl:    FLUoxetine (PROZAC) 40 MG capsule, Take 1 capsule (40 mg total) by mouth daily., Disp: 30 capsule, Rfl: 0   hydrOXYzine (ATARAX) 25 MG tablet, Take 1 tablet (25 mg total) by mouth daily as  needed for anxiety., Disp: 90 tablet, Rfl: 0   Multiple Vitamins-Minerals (BARIATRIC MULTIVITAMINS/IRON PO), Take by mouth daily., Disp: , Rfl:    ondansetron (ZOFRAN-ODT) 4 MG disintegrating tablet, Dissolve 1 tablet (4 mg total) by mouth every 6 (six) hours as needed for nausea or vomiting., Disp: 20 tablet, Rfl: 0   ondansetron (ZOFRAN-ODT) 4 MG disintegrating tablet, Take 1 tablet (4 mg total) by mouth every 8 (eight) hours as needed for nausea or vomiting., Disp: 20 tablet, Rfl: 0   pantoprazole (PROTONIX) 40 MG tablet, Take 1 tablet (40 mg total) by mouth daily. (Patient taking differently: Take 20 mg by mouth.), Disp: 90 tablet, Rfl: 0   SUMAtriptan (IMITREX) 100 MG tablet, Take 100 mg by mouth every 2 (two) hours as needed for migraine., Disp: , Rfl:    traMADol (ULTRAM) 50 MG tablet, Take 1 tablet (50 mg total) by mouth every 6 (six) hours as needed (pain)., Disp: 10 tablet, Rfl: 0   valACYclovir (  VALTREX) 500 MG tablet, Take 500 mg by mouth 2 (two) times daily as needed (outbreak). , Disp: , Rfl:    albuterol (PROVENTIL) (2.5 MG/3ML) 0.083% nebulizer solution, Inhale 2.5 mg into the lungs every 4 (four) hours as needed for wheezing or shortness of breath. , Disp: , Rfl:  No current facility-administered medications for this visit.  Facility-Administered Medications Ordered in Other Visits:    gadopentetate dimeglumine (MAGNEVIST) injection 20 mL, 20 mL, Intravenous, Once PRN, Pearlean Brownie, Jason Fila, MD   sodium chloride flush (NS) 0.9 % injection 3 mL, 3 mL, Intravenous, Q12H, Fath, Darlin Priestly, MD  Social History: Social History   Tobacco Use   Smoking status: Never    Passive exposure: Yes   Smokeless tobacco: Never   Tobacco comments:    mother smokes outside/ mother deceased  Vaping Use   Vaping Use: Never used  Substance Use Topics   Alcohol use: Never    Comment: rarely   Drug use: No    Family Medical History: Family History  Problem Relation Age of Onset   Bipolar disorder  Mother    Kidney Stones Mother    Diabetes Mother    COPD Mother    Drug abuse Mother    Alcohol abuse Father    Kidney Stones Father    Prostate cancer Maternal Grandfather    Migraines Maternal Grandmother    Stroke Maternal Grandmother    Diabetes Maternal Grandmother    Kidney cancer Neg Hx    Bladder Cancer Neg Hx     Physical Examination: Vitals:   11/06/22 1300  BP: 130/75  Pulse: 65  SpO2: 99%    General: Patient is well developed, well nourished, calm, collected, and in no apparent distress. Attention to examination is appropriate.  Neck:   Supple.  Full range of motion with significant discomfort on rotation to the right.  Respiratory: Patient is breathing without any difficulty.   NEUROLOGICAL:     Awake, alert, oriented to person, place, and time.  Speech is clear and fluent.   Cranial Nerves: Pupils equal round and reactive to light.  Facial tone is symmetric.  Facial sensation is symmetric. Shoulder shrug is symmetric. Tongue protrusion is midline.  There is no pronator drift.  ROM of spine: full.    Strength: Side Biceps Triceps Deltoid Interossei Grip Wrist Ext. Wrist Flex.  R 4- L Side Iliopsoas Quads Hamstring PF DF EHL  R L Reflexes are 1+ and symmetric at the biceps, triceps, brachioradialis, patella and achilles.   Hoffman's is absent.   Bilateral upper and lower extremity sensation is intact to light touch with exception of R C6 distribution which is diminished.    No evidence of dysmetria noted.  Gait is normal.     Medical Decision Making  Imaging: MRI C spine 05/04/2022 IMPRESSION: 1. Subtle retrolisthesis at C5-C6 with bulky disc osteophyte complex, mild facet and ligament flavum hypertrophy. Subsequent mild spinal stenosis and spinal cord mass effect. No cord signal abnormality. Severe left and moderate to severe right C6 neural foraminal stenosis. 2. Mild multifactorial  spinal stenosis at C6-C7 with no cord mass effect. Moderate left and moderate to severe right C7 foraminal stenosis. 3. C4-C5 borderline to mild spinal stenosis with up to moderate bilateral C5 foraminal stenosis.     Electronically Signed  By: Odessa Fleming M.D.   On: 05/06/2022 07:42  I have personally reviewed the images and agree with the above interpretation.  Assessment and Plan: Tammy Young is a pleasant 57 y.o. female with right-sided C6 and C7 radiculopathies with weakness in a similar distribution.  She has tried and failed conservative management.  She did 3 months of physical therapy last summer.  She is also done injections and medications without improvement.  At this point, no further conservative management is indicated.  I recommended C5-7 anterior cervical discectomy and fusion.  I discussed the planned procedure at length with the patient, including the risks, benefits, alternatives, and indications. The risks discussed include but are not limited to bleeding, infection, need for reoperation, spinal fluid leak, stroke, vision loss, anesthetic complication, coma, paralysis, and even death. We also discussed the possibility of post-operative dysphagia, vocal cord paralysis, and the risk of adjacent segment disease in the future. I also described in detail that improvement was not guaranteed.  The patient expressed understanding of these risks, and asked that we proceed with surgery. I described the surgery in layman's terms, and gave ample opportunity for questions, which were answered to the best of my ability.  I spent a total of 30 minutes in this patient's care today. This time was spent reviewing pertinent records including imaging studies, obtaining and confirming history, performing a directed evaluation, formulating and discussing my recommendations, and documenting the visit within the medical record.     Thank you for involving me in the care of this patient.       Ebrima Ranta K. Myer Haff MD, [facility]

## 2022-11-06 ENCOUNTER — Encounter: Payer: Self-pay | Admitting: Neurosurgery

## 2022-11-06 ENCOUNTER — Other Ambulatory Visit: Payer: Self-pay

## 2022-11-06 ENCOUNTER — Ambulatory Visit (INDEPENDENT_AMBULATORY_CARE_PROVIDER_SITE_OTHER): Payer: Medicaid Other | Admitting: Neurosurgery

## 2022-11-06 VITALS — BP 130/75 | HR 65 | Ht 64.0 in | Wt 158.0 lb

## 2022-11-06 DIAGNOSIS — R29898 Other symptoms and signs involving the musculoskeletal system: Secondary | ICD-10-CM | POA: Diagnosis not present

## 2022-11-06 DIAGNOSIS — M5412 Radiculopathy, cervical region: Secondary | ICD-10-CM | POA: Diagnosis not present

## 2022-11-06 DIAGNOSIS — Z01818 Encounter for other preprocedural examination: Secondary | ICD-10-CM

## 2022-11-06 NOTE — Patient Instructions (Addendum)
Please see below for information in regards to your upcoming surgery:   Planned surgery: C5-7 anterior cervical discectomy and fusion   Surgery date: 11/21/22 - you will find out your arrival time the business day before your surgery.   Pre-op appointment at [facility] Pre-admit Testing: we will call you with a date/time for this. Pre-admit testing is located on the first floor of the [facility], [address] [employer] [address]. Please bring all prescriptions in the original prescription bottles to your appointment, even if you have reviewed medications by phone with a pharmacy representative. Pre-op labs may be done at your pre-op appointment. You are not required to fast for these labs. Should you need to change your pre-op appointment, please call Pre-admit testing at [phone].    Surgical clearance: we will send a form to Laren Everts, NP    NSAIDS (Non-steroidal anti-inflammatory drugs): because you are having a fusion, no NSAIDS (such as ibuprofen, aleve, naproxen, meloxicam, diclofenac) for 3 months after surgery. Celebrex is an exception. Tylenol is ok because it is not an NSAID.   Lifting restrictions after surgery:  First 6 weeks: 5-10 pounds Weeks 6-12: can increase up to 25 pounds After 12 weeks, can lift as tolerated   Because you are having a fusion: for appointments after your 2 week follow-up: please arrive at the [facility] outpatient imaging center ([address], [facility] [place]) or [facility] one hour prior to your appointment for x-rays. This applies to every appointment after your 2 week follow-up. Failure to do so may result in your appointment being rescheduled.    If you have FMLA/disability paperwork, please drop it off or fax it to [phone], attention Patty.   We can be reached by phone or mychart 8am-4pm, Monday-Friday. If you have any questions/concerns before or after surgery, you can reach [place]  at [phone], or you can send a mychart message. If you have a concern after hours that cannot wait until normal business hours, you can call [phone] and ask to page the neurosurgeon on call for .      Appointments/FMLA & disability paperwork: Patty & Cristin  Nurse: Royston Cowper  Medical assistants: Laurann Montana Physician Assistant's: Manning Charity & Drake Leach Surgeon: Venetia Night, MD

## 2022-11-07 ENCOUNTER — Telehealth: Payer: Self-pay

## 2022-11-07 ENCOUNTER — Other Ambulatory Visit: Payer: Self-pay | Admitting: Psychiatry

## 2022-11-07 MED ORDER — SERTRALINE HCL 25 MG PO TABS: 25.0000 mg | ORAL_TABLET | Freq: Every day | ORAL | 0 refills | Status: DC

## 2022-11-07 NOTE — Telephone Encounter (Signed)
pt son called states he has some concerns about the prozac. he states that with the increase she states his mom can not stay focus, and spaces out. she having increase neviousness and anxiety. and jitterness and on edge.   He like to speak with dr. Vanetta Shawl about other options because the  was making her have crying spells and jitterness.

## 2022-11-07 NOTE — Telephone Encounter (Signed)
Discussed with both the patient and her son with her consent.  She states that she has been doing well but prefers her son to provide further details. Her son reports that she has been behaving "abnormal", appearing jittery with a short attention span. He notes that she engages in unrelated conversations. She has a smile, which he thinks is fake and creepy. However, he assures there are no safety concerns for her or others.  They both agree with the following plan:  - Discontinue fluoxetine. - After a week, initiate sertraline at 25 mg at night, with a plan to gradually increase the dosage in the future, starting from the lowest effective dose to minimize potential side effects. - Advised to seek consultation with primary care if symptoms persist despite discontinuation of fluoxetine. - Next appointment scheduled for May 6th at 2:30 PM for 30 minutes, in person.

## 2022-11-08 ENCOUNTER — Ambulatory Visit: Payer: Medicaid Other | Admitting: Neurosurgery

## 2022-11-08 ENCOUNTER — Other Ambulatory Visit: Payer: Self-pay

## 2022-11-08 ENCOUNTER — Encounter
Admission: RE | Admit: 2022-11-08 | Discharge: 2022-11-08 | Disposition: A | Discharge status: Home / Self Care | Payer: Medicaid Other | Source: Ambulatory Visit | Attending: Neurosurgery | Admitting: Neurosurgery

## 2022-11-08 VITALS — BP 143/79 | HR 59 | Resp 16 | Ht 64.5 in | Wt 160.0 lb

## 2022-11-08 DIAGNOSIS — R2990 Unspecified symptoms and signs involving the nervous system: Secondary | ICD-10-CM

## 2022-11-08 DIAGNOSIS — E785 Hyperlipidemia, unspecified: Secondary | ICD-10-CM | POA: Diagnosis not present

## 2022-11-08 DIAGNOSIS — Z01818 Encounter for other preprocedural examination: Secondary | ICD-10-CM | POA: Insufficient documentation

## 2022-11-08 DIAGNOSIS — I10 Essential (primary) hypertension: Secondary | ICD-10-CM

## 2022-11-08 DIAGNOSIS — Z01810 Encounter for preprocedural cardiovascular examination: Secondary | ICD-10-CM | POA: Diagnosis not present

## 2022-11-08 HISTORY — DX: Chronic obstructive pulmonary disease, unspecified: J44.9

## 2022-11-08 LAB — TYPE AND SCREEN
ABO/RH(D): O NEG
Antibody Screen: NEGATIVE

## 2022-11-08 LAB — SURGICAL PCR SCREEN
MRSA, PCR: NEGATIVE
Staphylococcus aureus: NEGATIVE

## 2022-11-08 NOTE — Patient Instructions (Addendum)
Your procedure is scheduled on: Wednesday 11/21/22 To find out your arrival time, please call [phone] between 1PM - 3PM on:   Tuesday 11/20/22 Report to the Registration Desk on the 1st floor of the [facility]. Valet parking is available.  If your arrival time is 6:00 am, do not arrive before that time as the [facility] entrance doors do not open until 6:00 am.  REMEMBER: Instructions that are not followed completely may result in serious medical risk, up to and including death; or upon the discretion of your surgeon and anesthesiologist your surgery may need to be rescheduled.  Do not eat food after midnight the night before surgery.  No gum chewing or hard candies.  You may however, drink CLEAR liquids up to 2 hours before you are scheduled to arrive for your surgery. Do not drink anything within 2 hours of your scheduled arrival time.  Clear liquids include: - water  - apple juice without pulp - gatorade (not RED colors) - black coffee or tea (Do NOT add milk or creamers to the coffee or tea) Do NOT drink anything that is not on this list.  One week prior to surgery: Stop Anti-inflammatories (NSAIDS) such as Advil, Aleve, Ibuprofen, Motrin, Naproxen, Naprosyn and Aspirin based products such as Excedrin, Goody's Powder, BC Powder. You may however, continue to take Tylenol if needed for pain up until the day of surgery.  Stop ANY OVER THE COUNTER supplements until after surgery.  Continue taking all prescribed medications.  TAKE ONLY THESE MEDICATIONS THE MORNING OF SURGERY WITH A SIP OF WATER:  hydrOXYzine (ATARAX) 25 MG tablet if needed valACYclovir (VALTREX) 500 MG tablet if needed  Use inhalers on the day of surgery and bring to the hospital.  No Alcohol for 24 hours before or after surgery.  No Smoking including e-cigarettes for 24 hours before surgery.  No chewable tobacco products for at least 6 hours before surgery.  No nicotine patches on the day of  surgery.  Do not use any "recreational" drugs for at least a week (preferably 2 weeks) before your surgery.  Please be advised that the combination of cocaine and anesthesia may have negative outcomes, up to and including death. If you test positive for cocaine, your surgery will be cancelled.  On the morning of surgery brush your teeth with toothpaste and water, you may rinse your mouth with mouthwash if you wish. Do not swallow any toothpaste or mouthwash.  Use CHG Soap or wipes as directed on instruction sheet.  Do not wear lotions, powders, or perfumes.   Do not shave body hair from the neck down 48 hours before surgery.  Wear comfortable clothing (specific to your surgery type) to the hospital.  Do not wear jewelry, make-up, hairpins, clips or nail polish.  Contact lenses, hearing aids and dentures may not be worn into surgery.  Do not bring valuables to the hospital. [facility] is not responsible for any missing/lost belongings or valuables.   Notify your doctor if there is any change in your medical condition (cold, fever, infection).  If you are being discharged the day of surgery, you will not be allowed to drive home. You will need a responsible individual to drive you home and stay with you for 24 hours after surgery.   If you are taking public transportation, you will need to have a responsible individual with you.  If you are being admitted to the hospital overnight, leave your suitcase in the car. After surgery  it may be brought to your room.  In case of increased patient census, it may be necessary for you, the patient, to continue your postoperative care in the Same Day Surgery department.  After surgery, you can help prevent lung complications by doing breathing exercises.  Take deep breaths and cough every 1-2 hours. Your doctor may order a device called an Incentive Spirometer to help you take deep breaths. When coughing or sneezing, hold a pillow firmly  against your incision with both hands. This is called "splinting." Doing this helps protect your incision. It also decreases belly discomfort.  Surgery Visitation Policy:  Patients undergoing a surgery or procedure may have two family members or support persons with them as long as the person is not COVID-19 positive or experiencing its symptoms.   Inpatient Visitation:    Visiting hours are 7 a.m. to 8 p.m. Up to four visitors are allowed at one time in a patient room. The visitors may rotate out with other people during the day. One designated support person (adult) may remain overnight.  Please call the Pre-admissions Testing Dept. at [phone] if you have any questions about these instructions.

## 2022-11-19 ENCOUNTER — Ambulatory Visit: Payer: Medicaid Other | Admitting: Psychiatry

## 2022-11-20 MED ORDER — FAMOTIDINE 20 MG PO TABS: 20.0000 mg | ORAL_TABLET | Freq: Once | ORAL | Status: DC

## 2022-11-20 MED ORDER — CEFAZOLIN IN SODIUM CHLORIDE 2-0.9 GM/100ML-% IV SOLN
2.0000 g | Freq: Once | INTRAVENOUS | Status: DC
  Filled 2022-11-20: qty 100

## 2022-11-20 MED ORDER — CEFAZOLIN SODIUM-DEXTROSE 2-4 GM/100ML-% IV SOLN: 2.0000 g | INTRAVENOUS | Status: DC

## 2022-11-21 ENCOUNTER — Ambulatory Visit: Payer: Medicaid Other | Admitting: Urgent Care

## 2022-11-21 ENCOUNTER — Encounter
Admission: RE | Disposition: A | Discharge status: Home / Self Care | Payer: Self-pay | Source: Ambulatory Visit | Attending: Neurosurgery

## 2022-11-21 ENCOUNTER — Other Ambulatory Visit: Payer: Self-pay

## 2022-11-21 ENCOUNTER — Ambulatory Visit: Payer: Medicaid Other

## 2022-11-21 ENCOUNTER — Ambulatory Visit
Admission: RE | Admit: 2022-11-21 | Discharge: 2022-11-21 | Disposition: A | Discharge status: Home / Self Care | Payer: Medicaid Other | Source: Ambulatory Visit | Attending: Neurosurgery | Admitting: Neurosurgery

## 2022-11-21 DIAGNOSIS — R29898 Other symptoms and signs involving the musculoskeletal system: Secondary | ICD-10-CM | POA: Diagnosis not present

## 2022-11-21 DIAGNOSIS — Z01818 Encounter for other preprocedural examination: Secondary | ICD-10-CM

## 2022-11-21 DIAGNOSIS — M5412 Radiculopathy, cervical region: Secondary | ICD-10-CM | POA: Diagnosis not present

## 2022-11-21 DIAGNOSIS — M4802 Spinal stenosis, cervical region: Secondary | ICD-10-CM | POA: Insufficient documentation

## 2022-11-21 DIAGNOSIS — N189 Chronic kidney disease, unspecified: Secondary | ICD-10-CM | POA: Diagnosis not present

## 2022-11-21 DIAGNOSIS — I129 Hypertensive chronic kidney disease with stage 1 through stage 4 chronic kidney disease, or unspecified chronic kidney disease: Secondary | ICD-10-CM | POA: Insufficient documentation

## 2022-11-21 HISTORY — PX: ANTERIOR CERVICAL DECOMP/DISCECTOMY FUSION: SHX1161

## 2022-11-21 SURGERY — ANTERIOR CERVICAL DECOMPRESSION/DISCECTOMY FUSION 2 LEVELS
Anesthesia: General | Site: Spine Cervical

## 2022-11-21 MED ORDER — METHOCARBAMOL 500 MG PO TABS
ORAL_TABLET | ORAL | Status: AC
  Filled 2022-11-21: qty 1

## 2022-11-21 MED ORDER — CEFAZOLIN SODIUM-DEXTROSE 2-4 GM/100ML-% IV SOLN
2.0000 g | INTRAVENOUS | Status: AC
  Administered 2022-11-21: 2 g via INTRAVENOUS

## 2022-11-21 MED ORDER — MIDAZOLAM HCL 2 MG/2ML IJ SOLN
INTRAMUSCULAR | Status: DC | PRN
  Administered 2022-11-21: 2 mg via INTRAVENOUS

## 2022-11-21 MED ORDER — SURGIFLO WITH THROMBIN (HEMOSTATIC MATRIX KIT) OPTIME
TOPICAL | Status: DC | PRN
  Administered 2022-11-21: 1 via TOPICAL

## 2022-11-21 MED ORDER — MIDAZOLAM HCL 2 MG/2ML IJ SOLN
INTRAMUSCULAR | Status: AC
  Filled 2022-11-21: qty 2

## 2022-11-21 MED ORDER — CHLORHEXIDINE GLUCONATE 0.12 % MT SOLN
OROMUCOSAL | Status: AC
  Filled 2022-11-21: qty 15

## 2022-11-21 MED ORDER — DEXAMETHASONE SODIUM PHOSPHATE 10 MG/ML IJ SOLN
INTRAMUSCULAR | Status: AC
  Filled 2022-11-21: qty 1

## 2022-11-21 MED ORDER — ACETAMINOPHEN 10 MG/ML IV SOLN: 1000.0000 mg | Freq: Once | INTRAVENOUS | Status: DC | PRN

## 2022-11-21 MED ORDER — ONDANSETRON HCL 4 MG/2ML IJ SOLN
INTRAMUSCULAR | Status: DC | PRN
  Administered 2022-11-21: 4 mg via INTRAVENOUS

## 2022-11-21 MED ORDER — PROPOFOL 10 MG/ML IV BOLUS
INTRAVENOUS | Status: DC | PRN
  Administered 2022-11-21: 200 mg via INTRAVENOUS

## 2022-11-21 MED ORDER — SUCCINYLCHOLINE CHLORIDE 200 MG/10ML IV SOSY
PREFILLED_SYRINGE | INTRAVENOUS | Status: AC
  Filled 2022-11-21: qty 10

## 2022-11-21 MED ORDER — EPINEPHRINE PF 1 MG/ML IJ SOLN
INTRAMUSCULAR | Status: AC
  Filled 2022-11-21: qty 1

## 2022-11-21 MED ORDER — FENTANYL CITRATE (PF) 100 MCG/2ML IJ SOLN
INTRAMUSCULAR | Status: AC
  Filled 2022-11-21: qty 4

## 2022-11-21 MED ORDER — LIDOCAINE HCL (PF) 2 % IJ SOLN
INTRAMUSCULAR | Status: AC
  Filled 2022-11-21: qty 5

## 2022-11-21 MED ORDER — CEFAZOLIN SODIUM-DEXTROSE 2-4 GM/100ML-% IV SOLN
INTRAVENOUS | Status: AC
  Filled 2022-11-21: qty 100

## 2022-11-21 MED ORDER — GLYCOPYRROLATE 0.2 MG/ML IJ SOLN
INTRAMUSCULAR | Status: DC | PRN
  Administered 2022-11-21: .2 mg via INTRAVENOUS

## 2022-11-21 MED ORDER — EPHEDRINE 5 MG/ML INJ
INTRAVENOUS | Status: AC
  Filled 2022-11-21: qty 5

## 2022-11-21 MED ORDER — LIDOCAINE HCL (CARDIAC) PF 100 MG/5ML IV SOSY
PREFILLED_SYRINGE | INTRAVENOUS | Status: DC | PRN
  Administered 2022-11-21: 50 mg via INTRAVENOUS

## 2022-11-21 MED ORDER — 0.9 % SODIUM CHLORIDE (POUR BTL) OPTIME
TOPICAL | Status: DC | PRN
  Administered 2022-11-21: 500 mL

## 2022-11-21 MED ORDER — GLYCOPYRROLATE 0.2 MG/ML IJ SOLN
INTRAMUSCULAR | Status: AC
  Filled 2022-11-21: qty 1

## 2022-11-21 MED ORDER — FENTANYL CITRATE (PF) 100 MCG/2ML IJ SOLN
INTRAMUSCULAR | Status: DC | PRN
  Administered 2022-11-21 (×2): 50 ug via INTRAVENOUS

## 2022-11-21 MED ORDER — OXYCODONE HCL 5 MG PO TABS
ORAL_TABLET | ORAL | Status: AC
  Filled 2022-11-21: qty 1

## 2022-11-21 MED ORDER — CHLORHEXIDINE GLUCONATE 0.12 % MT SOLN
15.0000 mL | Freq: Once | OROMUCOSAL | Status: AC
  Administered 2022-11-21: 15 mL via OROMUCOSAL

## 2022-11-21 MED ORDER — PROPOFOL 10 MG/ML IV BOLUS
INTRAVENOUS | Status: AC
  Filled 2022-11-21: qty 40

## 2022-11-21 MED ORDER — OXYCODONE HCL 5 MG PO TABS
5.0000 mg | ORAL_TABLET | Freq: Once | ORAL | Status: AC | PRN
  Administered 2022-11-21: 5 mg via ORAL

## 2022-11-21 MED ORDER — METHOCARBAMOL 500 MG PO TABS: 500.0000 mg | ORAL_TABLET | Freq: Four times a day (QID) | ORAL | 0 refills | Status: DC

## 2022-11-21 MED ORDER — METHOCARBAMOL 500 MG PO TABS
500.0000 mg | ORAL_TABLET | ORAL | Status: AC
  Administered 2022-11-21: 500 mg via ORAL

## 2022-11-21 MED ORDER — ORAL CARE MOUTH RINSE: 15.0000 mL | Freq: Once | OROMUCOSAL | Status: AC

## 2022-11-21 MED ORDER — ONDANSETRON HCL 4 MG/2ML IJ SOLN
INTRAMUSCULAR | Status: AC
  Filled 2022-11-21: qty 2

## 2022-11-21 MED ORDER — SENNA 8.6 MG PO TABS: 1.0000 | ORAL_TABLET | Freq: Every day | ORAL | 0 refills | Status: DC | PRN

## 2022-11-21 MED ORDER — DEXMEDETOMIDINE HCL IN NACL 80 MCG/20ML IV SOLN
INTRAVENOUS | Status: DC | PRN
  Administered 2022-11-21: 12 ug via INTRAVENOUS

## 2022-11-21 MED ORDER — ACETAMINOPHEN 10 MG/ML IV SOLN
INTRAVENOUS | Status: AC
  Filled 2022-11-21: qty 100

## 2022-11-21 MED ORDER — FENTANYL CITRATE (PF) 100 MCG/2ML IJ SOLN
INTRAMUSCULAR | Status: AC
  Filled 2022-11-21: qty 2

## 2022-11-21 MED ORDER — PROPOFOL 1000 MG/100ML IV EMUL
INTRAVENOUS | Status: AC
  Filled 2022-11-21: qty 100

## 2022-11-21 MED ORDER — PROPOFOL 500 MG/50ML IV EMUL
INTRAVENOUS | Status: DC | PRN
  Administered 2022-11-21: 125 ug/kg/min via INTRAVENOUS

## 2022-11-21 MED ORDER — DROPERIDOL 2.5 MG/ML IJ SOLN: 0.6250 mg | Freq: Once | INTRAMUSCULAR | Status: DC | PRN

## 2022-11-21 MED ORDER — DEXAMETHASONE SODIUM PHOSPHATE 10 MG/ML IJ SOLN
INTRAMUSCULAR | Status: DC | PRN
  Administered 2022-11-21: 10 mg via INTRAVENOUS

## 2022-11-21 MED ORDER — EPHEDRINE SULFATE (PRESSORS) 50 MG/ML IJ SOLN
INTRAMUSCULAR | Status: DC | PRN
  Administered 2022-11-21: 10 mg via INTRAVENOUS

## 2022-11-21 MED ORDER — LACTATED RINGERS IV SOLN: INTRAVENOUS | Status: DC

## 2022-11-21 MED ORDER — FAMOTIDINE 20 MG PO TABS
ORAL_TABLET | ORAL | Status: AC
  Filled 2022-11-21: qty 1

## 2022-11-21 MED ORDER — OXYCODONE HCL 5 MG PO TABS: 5.0000 mg | ORAL_TABLET | ORAL | 0 refills | Status: AC | PRN

## 2022-11-21 MED ORDER — BUPIVACAINE HCL (PF) 0.5 % IJ SOLN
INTRAMUSCULAR | Status: AC
  Filled 2022-11-21: qty 30

## 2022-11-21 MED ORDER — FENTANYL CITRATE (PF) 100 MCG/2ML IJ SOLN
25.0000 ug | INTRAMUSCULAR | Status: DC | PRN
  Administered 2022-11-21 (×3): 25 ug via INTRAVENOUS

## 2022-11-21 MED ORDER — ACETAMINOPHEN 10 MG/ML IV SOLN
INTRAVENOUS | Status: DC | PRN
  Administered 2022-11-21: 1000 mg via INTRAVENOUS

## 2022-11-21 MED ORDER — FAMOTIDINE 20 MG PO TABS
20.0000 mg | ORAL_TABLET | Freq: Once | ORAL | Status: AC
  Administered 2022-11-21: 20 mg via ORAL

## 2022-11-21 MED ORDER — OXYCODONE HCL 5 MG/5ML PO SOLN: 5.0000 mg | Freq: Once | ORAL | Status: AC | PRN

## 2022-11-21 MED ORDER — PROMETHAZINE HCL 25 MG/ML IJ SOLN: 6.2500 mg | INTRAMUSCULAR | Status: DC | PRN

## 2022-11-21 MED ORDER — SUCCINYLCHOLINE CHLORIDE 200 MG/10ML IV SOSY
PREFILLED_SYRINGE | INTRAVENOUS | Status: DC | PRN
  Administered 2022-11-21: 80 mg via INTRAVENOUS

## 2022-11-21 SURGICAL SUPPLY — 54 items
ADH SKN CLS APL DERMABOND .7 (GAUZE/BANDAGES/DRESSINGS) ×1
AGENT HMST KT MTR STRL THRMB (HEMOSTASIS) ×1
ALLOGRAFT BONE FIBER KORE 1CC (Bone Implant) IMPLANT
BASIN KIT SINGLE STR (MISCELLANEOUS) ×1 IMPLANT
BASKET BONE COLLECTION (BASKET) IMPLANT
BULB RESERV EVAC DRAIN JP 100C (MISCELLANEOUS) IMPLANT
BUR NEURO DRILL SOFT 3.0X3.8M (BURR) ×1 IMPLANT
DERMABOND ADVANCED .7 DNX12 (GAUZE/BANDAGES/DRESSINGS) ×1 IMPLANT
DRAIN CHANNEL JP 10F RND 20C F (MISCELLANEOUS) IMPLANT
DRAPE C ARM PK CFD 31 SPINE (DRAPES) ×1 IMPLANT
DRAPE LAPAROTOMY 77X122 PED (DRAPES) ×1 IMPLANT
DRAPE MICROSCOPE SPINE 48X150 (DRAPES) ×1 IMPLANT
DRSG OPSITE POSTOP 4X6 (GAUZE/BANDAGES/DRESSINGS) ×2 IMPLANT
ELECT REM PT RETURN 9FT ADLT (ELECTROSURGICAL) ×1
ELECTRODE REM PT RTRN 9FT ADLT (ELECTROSURGICAL) ×1 IMPLANT
FEE INTRAOP CADWELL SUPPLY NCS (MISCELLANEOUS) IMPLANT
FEE INTRAOP MONITOR IMPULS NCS (MISCELLANEOUS) IMPLANT
GLOVE BIOGEL PI IND STRL 6.5 (GLOVE) ×2 IMPLANT
GLOVE SURG SYN 6.5 ES PF (GLOVE) ×2 IMPLANT
GLOVE SURG SYN 6.5 PF PI (GLOVE) ×2 IMPLANT
GLOVE SURG SYN 8.5  E (GLOVE) ×3
GLOVE SURG SYN 8.5 E (GLOVE) ×3 IMPLANT
GLOVE SURG SYN 8.5 PF PI (GLOVE) ×3 IMPLANT
GOWN SRG LRG LVL 4 IMPRV REINF (GOWNS) ×2 IMPLANT
GOWN SRG XL LVL 3 NONREINFORCE (GOWNS) ×1 IMPLANT
GOWN STRL NON-REIN TWL XL LVL3 (GOWNS) ×1
GOWN STRL REIN LRG LVL4 (GOWNS) ×2
INTRAOP CADWELL SUPPLY FEE NCS (MISCELLANEOUS)
INTRAOP DISP SUPPLY FEE NCS (MISCELLANEOUS)
INTRAOP MONITOR FEE IMPULS NCS (MISCELLANEOUS)
INTRAOP MONITOR FEE IMPULSE (MISCELLANEOUS)
KIT TURNOVER KIT A (KITS) ×1 IMPLANT
MANIFOLD NEPTUNE II (INSTRUMENTS) ×1 IMPLANT
NDL SAFETY ECLIP 18X1.5 (MISCELLANEOUS) ×1 IMPLANT
NS IRRIG 1000ML POUR BTL (IV SOLUTION) ×1 IMPLANT
NS IRRIG 500ML POUR BTL (IV SOLUTION) IMPLANT
PACK LAMINECTOMY NEURO (CUSTOM PROCEDURE TRAY) ×1 IMPLANT
PAD ARMBOARD 7.5X6 YLW CONV (MISCELLANEOUS) ×2 IMPLANT
PIN CASPAR 14 (PIN) ×1 IMPLANT
PIN CASPAR 14MM (PIN) ×1
PLATE ACP 1.6X36 2LVL (Plate) IMPLANT
SCREW ACP 3.5X17 S/D VARIA (Screw) IMPLANT
SPACER C HEDRON 12X14 7M 7D (Spacer) IMPLANT
SPONGE KITTNER 5P (MISCELLANEOUS) ×1 IMPLANT
STAPLER SKIN PROX 35W (STAPLE) IMPLANT
SURGIFLO W/THROMBIN 8M KIT (HEMOSTASIS) ×1 IMPLANT
SUT DVC VLOC 90 3-0 CV23 UNDY (SUTURE) IMPLANT
SUT V-LOC 90 ABS DVC 3-0 CL (SUTURE) ×1 IMPLANT
SUT VIC AB 3-0 SH 8-18 (SUTURE) ×1 IMPLANT
SUT VICRYL 3-0 CR8 SH (SUTURE) ×1 IMPLANT
SYR 20ML LL LF (SYRINGE) ×1 IMPLANT
TAPE CLOTH 3X10 WHT NS LF (GAUZE/BANDAGES/DRESSINGS) ×3 IMPLANT
TRAP FLUID SMOKE EVACUATOR (MISCELLANEOUS) ×1 IMPLANT
TRAY FOLEY SLVR 16FR LF STAT (SET/KITS/TRAYS/PACK) IMPLANT

## 2022-11-21 NOTE — Anesthesia Procedure Notes (Signed)
Procedure Name: Intubation Date/Time: 11/21/2022 11:02 AM  Performed by: Jeannene Patella, CRNAPre-anesthesia Checklist: Patient identified, Emergency Drugs available, Suction available, Patient being monitored and Timeout performed Patient Re-evaluated:Patient Re-evaluated prior to induction Oxygen Delivery Method: Circle system utilized Preoxygenation: Pre-oxygenation with 100% oxygen Induction Type: IV induction Ventilation: Mask ventilation without difficulty Laryngoscope Size: McGraph and 4 Grade View: Grade I Tube type: Oral Tube size: 6.5 mm Number of attempts: 1 Airway Equipment and Method: Stylet and Video-laryngoscopy Placement Confirmation: ETT inserted through vocal cords under direct vision, positive ETCO2 and breath sounds checked- equal and bilateral Secured at: 19 (at teeth) cm Tube secured with: Tape Dental Injury: Teeth and Oropharynx as per pre-operative assessment

## 2022-11-21 NOTE — Interval H&P Note (Signed)
History and Physical Interval Note:  11/21/2022 10:25 AM  Tammy Young  has presented today for surgery, with the diagnosis of M54.12 cervical radiculopathy R29.898 right arm weakness.  The various methods of treatment have been discussed with the patient and family. After consideration of risks, benefits and other options for treatment, the patient has consented to  Procedure(s): C5-7 ANTERIOR CERVICAL DISCECTOMY AND FUSION (N/A) as a surgical intervention.  The patient's history has been reviewed, patient examined, no change in status, stable for surgery.  I have reviewed the patient's chart and labs.  Questions were answered to the patient's satisfaction.    Heart sounds normal no MRG. Chest Clear to Auscultation Bilaterally.   Lorelle Macaluso

## 2022-11-21 NOTE — Discharge Instructions (Addendum)
Your surgeon has performed an operation on your cervical spine (neck) to relieve pressure on the spinal cord and/or nerves. This involved making an incision in the front of your neck and removing one or more of the discs that support your spine. Next, a small piece of bone, a titanium plate, and screws were used to fuse two or more of the vertebrae (bones) together.  The following are instructions to help in your recovery once you have been discharged from the hospital. Even if you feel well, it is important that you follow these activity guidelines. If you do not let your neck heal properly from the surgery, you can increase the chance of return of your symptoms and other complications.  * Do not take anti-inflammatory medications for 3 months after surgery (naproxen [Aleve], ibuprofen [Advil, Motrin], etc.). These medications can prevent your bones from healing properly.  Celebrex, if prescribed, is ok to take.  Activity    No bending, lifting, or twisting ("BLT"). Avoid lifting objects heavier than 10 pounds (gallon milk jug).  Where possible, avoid household activities that involve lifting, bending, reaching, pushing, or pulling such as laundry, vacuuming, grocery shopping, and childcare. Try to arrange for help from friends and family for these activities while your back heals.  Increase physical activity slowly as tolerated.  Taking short walks is encouraged, but avoid strenuous exercise. Do not jog, run, bicycle, lift weights, or participate in any other exercises unless specifically allowed by your doctor.  Talk to your doctor before resuming sexual activity.  You should not drive until cleared by your doctor.  Until released by your doctor, you should not return to work or school.  You should rest at home and let your body heal.   You may shower three days after your surgery.  After showering, lightly dab your incision dry. Do not take a tub bath or go swimming until approved by your  doctor at your follow-up appointment.  If your doctor ordered a cervical collar (neck brace) for you, you should wear it whenever you are out of bed. You may remove it when lying down or sleeping, but you should wear it at all other times. Not all neck surgeries require a cervical collar.  If you smoke, we strongly recommend that you quit.  Smoking has been proven to interfere with normal bone healing and will dramatically reduce the success rate of your surgery. Please contact [facility] (800-QUIT-NOW) and use the resources at www.[facility].com for assistance in stopping smoking.  Surgical Incision   If you have a dressing on your incision, you may remove it two days after your surgery. Keep your incision area clean and dry.  If you have staples or stitches on your incision, you should have a follow up scheduled for removal. If you do not have staples or stitches, you will have steri-strips (small pieces of surgical tape) or Dermabond glue. The steri-strips/glue should begin to peel away within about a week (it is fine if the steri-strips fall off before then). If the strips are still in place one week after your surgery, you may gently remove them.  Diet           You may return to your usual diet. However, you may experience discomfort when swallowing in the first month after your surgery. This is normal. You may find that softer foods are more comfortable for you to swallow. Be sure to stay hydrated.  When to Contact Us  You may experience pain in your   neck and/or pain between your shoulder blades. This is normal and should improve in the next few weeks with the help of pain medication, muscle relaxers, and rest. Some patients report that a warm compress on the back of the neck or between the shoulder blades helps.  However, should you experience any of the following, contact us immediately: New numbness or weakness Pain that is progressively getting worse, and is not relieved by your pain  medication, muscle relaxers, rest, and warm compresses Bleeding, redness, swelling, pain, or drainage from surgical incision Chills or flu-like symptoms Fever greater than 101.0 F (38.3 C) Inability to eat, drink fluids, or take medications Problems with bowel or bladder functions Difficulty breathing or shortness of breath Warmth, tenderness, or swelling in your calf Contact Information During office hours (Monday-Friday 9 am to 5 pm), please call your physician at [phone] and ask for Sharlot Gowda After hours and weekends, please call [phone] and speak with the neurosurgeon on call For a life-threatening emergency, call 911    AMBULATORY SURGERY  DISCHARGE INSTRUCTIONS   The drugs that you were given will stay in your system until tomorrow so for the next 24 hours you should not:  Drive an automobile Make any legal decisions Drink any alcoholic beverage   You may resume regular meals tomorrow.  Today it is better to start with liquids and gradually work up to solid foods.  You may eat anything you prefer, but it is better to start with liquids, then soup and crackers, and gradually work up to solid foods.   Please notify your doctor immediately if you have any unusual bleeding, trouble breathing, redness and pain at the surgery site, drainage, fever, or pain not relieved by medication.     Additional Instructions:

## 2022-11-21 NOTE — Progress Notes (Signed)
Patient awakens and states neck is hurting really bad then  returns to sleep. States her pain is unbearable but then falls back to sleep.

## 2022-11-21 NOTE — Transfer of Care (Signed)
Immediate Anesthesia Transfer of Care Note  Patient: Tammy Young  Procedure(s) Performed: C5-7 ANTERIOR CERVICAL DISCECTOMY AND FUSION (Spine Cervical)  Patient Location: PACU  Anesthesia Type:General  Level of Consciousness: drowsy and patient cooperative  Airway & Oxygen Therapy: Patient Spontanous Breathing and Patient connected to face mask oxygen  Post-op Assessment: Report given to RN and Post -op Vital signs reviewed and stable  Post vital signs: Reviewed and stable  Last Vitals:  Vitals Value Taken Time  BP 138/72 11/21/22 1257  Temp    Pulse 61 11/21/22 1259  Resp 17 11/21/22 1259  SpO2 100 % 11/21/22 1259  Vitals shown include unvalidated device data.  Last Pain:  Vitals:   11/21/22 0915  TempSrc: Oral         Complications: No notable events documented.

## 2022-11-21 NOTE — Op Note (Signed)
Indications: [name] is suffering from  M54.12 cervical radiculopathy, R29.898 right arm weakness . The patient tried and failed conservative management, prompting surgical intervention.  Findings: severe foraminal stenosis  Preoperative Diagnosis:  M54.12 cervical radiculopathy, R29.898 right arm weakness  Postoperative Diagnosis: same   EBL: 50 ml IVF: see AR ml Drains: none Disposition: Extubated and Stable to PACU Complications: none  No foley catheter was placed.   Preoperative Note:   Risks of surgery discussed include: infection, bleeding, stroke, coma, death, paralysis, CSF leak, nerve/spinal cord injury, numbness, tingling, weakness, complex regional pain syndrome, recurrent stenosis and/or disc herniation, vascular injury, development of instability, neck/back pain, need for further surgery, persistent symptoms, development of deformity, and the risks of anesthesia. The patient understood these risks and agreed to proceed.  Operative Note:   Procedure:  1) Anterior cervical diskectomy and fusion at C5/6 and C6/7 2) Anterior cervical instrumentation at C5 - 7 3) Placement of biomechanical devices at C5/6 and C6/7  4) Use of operative microscope 5) Use of flouroscopy   Procedure: After obtaining informed consent, the patient taken to the operating room, placed in supine position, general anesthesia induced.  The patient had a small shoulder roll placed behind their shoulders.  The patient received preop antibiotics and IV Decadron.  The patient had a neck incision outlined, was prepped and draped in usual sterile fashion. The incision was injected with local anesthetic.   An incision was opened, dissection taken down medial to the carotid artery and jugular vein, lateral to the trachea and esophagus.  The prevertebral fascia identified and a localizing x-ray demonstrated the correct level.  The longus colli were dissected laterally, and self-retaining retractors placed  to open the operative field. The microscope was then brought into the field.  With this complete, distractor pins were placed in the vertebral bodies of C5 and C7. The distractor was placed, and the annulus at C5/6 was opened using a bovie.  Curettes and pituitary rongeurs used to remove the majority of disk, then the drill was used to remove the posterior osteophyte, expose the posterior longitudinal ligament, and begin the foraminotomies. The nerve hook was used to elevate the posterior longitudinal ligament, which was then removed with Kerrison rongeurs to complete decompression of the spinal cord. The Kerrison rongeurs were then used to complete the foraminotomies bilaterally. The nerve hook could be passed out the foramen bilaterally, ensuring decompression of the nerve roots. Meticulous hemostasis was obtained. A biomechanical device (Globus Hedron C 7 mm height x 14 mm width by 12 mm depth) was placed at C5/6. The device had been filled with demineralized bone matrix for aid in arthrodesis.  Please note that the procedure included removal of the disc, removal of the posterior osteophytes, and removal of the posterior longitudinal ligament to ensure decompression of the spinal cord.  Additionally, foraminotomies were performed on both sides of the spinal canal.  We then moved to C6/7.  Curettes and pituitary rongeurs used to remove the majority of disk, then the drill was used to remove the posterior osteophyte, expose the posterior longitudinal ligament, and begin the foraminotomies. The nerve hook was used to elevate the posterior longitudinal ligament, which was then removed with Kerrison rongeurs to complete decompression of the spinal cord. The Kerrison rongeurs were then used to complete the foraminotomies bilaterally. The nerve hook could be passed out the foramen bilaterally, ensuring decompression of the nerve roots. Meticulous hemostasis was obtained. A biomechanical device (Globus Hedron C 7  mm  height x 14 mm width by 12 mm depth) was placed at C6/7. The device had been filled with demineralized bone matrix for aid in arthrodesis.  Please note that the procedure included removal of the disc, removal of the posterior osteophytes, and removal of the posterior longitudinal ligament to ensure decompression of the spinal cord.  Additionally, foraminotomies were performed on both sides of the spinal canal.  The caspar distractor was removed, and bone wax used for hemostasis. A separate, 36 mm Nuvasive ACP plate was chosen.  Two screws placed in each vertebral body, respectively making sure the screws were behind the locking mechanism.  Final AP and lateral radiographs were taken.   Please note that the plate is not inclusive to the biomechanical devices.  The anchoring mechanism of the plate is completely separate from the biomechanical devices.  With everything in good position, the wound was irrigated copiously and meticulous hemostasis obtained.  Wound was closed in 2 layers using interrupted inverted 3-0 Vicryl sutures in the platysma and 3-0 monocryl on the dermis.  The wound was dressed with dermabond, the head of bed at 30 degrees, taken to recovery room in stable condition.  No new postop neurological deficits were identified.  Sponge and pattie counts were correct at the end of the procedure.     I performed the entire procedure with the assistance of Manning Charity PA as an Designer, television/film set. An assistant was required for this procedure due to the complexity.  The assistant provided assistance in tissue manipulation and suction, and was required for the successful and safe performance of the procedure. I performed the critical portions of the procedure.   Venetia Night MD

## 2022-11-21 NOTE — Discharge Summary (Signed)
Discharge Summary  Patient ID: Tammy Young MRN: [id number] DOB/AGE: 10/06/1965 57 y.o.  Admit date: 11/21/2022 Discharge date: 11/21/2022  Admission Diagnoses: M54.12 cervical radiculopathy, R29.898 right arm weakness  Discharge Diagnoses:  Active Problems:   * No active hospital problems. *   Discharged Condition: good  Hospital Course:  Tammy Young is a 57 year old presenting with cervical radiculopathy and right arm weakness.  She underwent a C5-7 ACDF.  Her intraoperative course was uncomplicated.  She was monitored in PACU for minimum of 4 hours and was able to discharge home after ambulating, urinating, and tolerating p.o. intake.  She was given prescription for oxycodone, Robaxin, and senna to take as needed.  Consults: None  Significant Diagnostic Studies: none   Treatments: surgery: As above.  Please see separately dictated operative report for further details.  Discharge Exam: Blood pressure 129/72, pulse 70, temperature (!) 97.2 F (36.2 C), temperature source Oral, resp. rate 20, height 5' 4.5" (1.638 m), weight 72.6 kg, SpO2 100 %. {physical [id number]}  Disposition: Discharge disposition: 01-Home or Self Care        Allergies as of 11/21/2022       Reactions   Bee Venom Other (See Comments)   " Swelling "   Nsaids    History of gastric bypass surgery   Amoxicillin Rash   Augmentin [amoxicillin-pot Clavulanate] Itching, Rash        Medication List     STOP taking these medications    FLUoxetine 40 MG capsule Commonly known as: PROZAC   traMADol 50 MG tablet Commonly known as: ULTRAM       TAKE these medications    albuterol (2.5 MG/3ML) 0.083% nebulizer solution Commonly known as: PROVENTIL Inhale 2.5 mg into the lungs every 4 (four) hours as needed for wheezing or shortness of breath.   ARIPiprazole 2 MG tablet Commonly known as: ABILIFY Take 1 tablet (2 mg total) by mouth at bedtime.   BARIATRIC MULTIVITAMINS/IRON PO Take  by mouth daily.   CALCIUM PO Take 1,500 mg by mouth daily.   ergocalciferol 1.25 MG (50000 UT) capsule Commonly known as: VITAMIN D2 Take 50,000 Units by mouth once a week.   fluticasone-salmeterol 100-50 MCG/ACT Aepb Commonly known as: ADVAIR Inhale 1 puff into the lungs 2 (two) times daily.   hydrOXYzine 25 MG tablet Commonly known as: ATARAX Take 1 tablet (25 mg total) by mouth daily as needed for anxiety.   methocarbamol 500 MG tablet Commonly known as: ROBAXIN Take 1 tablet (500 mg total) by mouth 4 (four) times daily.   OLANZapine 5 MG tablet Commonly known as: ZYPREXA Take 2.5 mg by mouth at bedtime.   ondansetron 4 MG disintegrating tablet Commonly known as: ZOFRAN-ODT Take 1 tablet (4 mg total) by mouth every 8 (eight) hours as needed for nausea or vomiting.   oxyCODONE 5 MG immediate release tablet Commonly known as: Roxicodone Take 1 tablet (5 mg total) by mouth every 4 (four) hours as needed for up to 5 days.   pantoprazole 40 MG tablet Commonly known as: PROTONIX Take 1 tablet (40 mg total) by mouth daily.   senna 8.6 MG Tabs tablet Commonly known as: SENOKOT Take 1 tablet (8.6 mg total) by mouth daily as needed.   sertraline 25 MG tablet Commonly known as: Zoloft Take 1 tablet (25 mg total) by mouth at bedtime.   SUMAtriptan 100 MG tablet Commonly known as: IMITREX Take 100 mg by mouth every 2 (two) hours as needed for migraine.  valACYclovir 500 MG tablet Commonly known as: VALTREX Take 500 mg by mouth 2 (two) times daily as needed (outbreak).   VITAMIN B-12 IJ Inject 1,000 mcg as directed every 30 (thirty) days.        Follow-up Information     Susanne Borders, PA Follow up on 12/06/2022.   Specialty: Neurosurgery Contact information: [address] [place] [address] [phone]                 Signed: Susanne Borders 11/21/2022, 12:45 PM

## 2022-11-21 NOTE — Anesthesia Preprocedure Evaluation (Signed)
Anesthesia Evaluation  Patient identified by MRN, date of birth, ID band Patient awake    Reviewed: Allergy & Precautions, NPO status , Patient's Chart, lab work & pertinent test results  History of Anesthesia Complications (+) PONV and history of anesthetic complications  Airway Mallampati: II  TM Distance: >3 FB Neck ROM: Full    Dental no notable dental hx. (+) Teeth Intact   Pulmonary neg pulmonary ROS, neg sleep apnea, COPD,  COPD inhaler, Patient abstained from smoking.Not current smoker OSA prior to gastric bypass   Pulmonary exam normal breath sounds clear to auscultation       Cardiovascular Exercise Tolerance: Good METShypertension, (-) CAD and (-) Past MI (-) dysrhythmias  Rhythm:Regular Rate:Normal - Systolic murmurs    Neuro/Psych  Headaches, neg Seizures PSYCHIATRIC DISORDERS Anxiety Depression     Neuromuscular disease CVA, No Residual Symptoms    GI/Hepatic hiatal hernia, PUD,GERD  Medicated and Controlled,,(+)     (-) substance abuse  S/p gastric bypass   Endo/Other  neg diabetesHypothyroidism    Renal/GU Renal disease     Musculoskeletal  (+) Arthritis ,    Abdominal   Peds  Hematology   Anesthesia Other Findings Past Medical History: No date: Anxiety and depression No date: Arthritis No date: Chronic constipation No date: Chronic cough No date: Chronic kidney disease (CKD)     Comment:  stage 3 No date: Collagen vascular disease (HCC)     Comment:  RA No date: COPD (chronic obstructive pulmonary disease) (HCC) No date: CTEPH (chronic thromboembolic pulmonary hypertension) (HCC) No date: Fatty liver disease, nonalcoholic No date: GERD (gastroesophageal reflux disease) No date: Headache No date: Hemorrhoids No date: Herpes simplex vulvovaginitis No date: Hiatal hernia No date: History of kidney stones No date: History of stomach ulcers No date: HLD (hyperlipidemia) No date:  Hypertension     Comment:  Resolved after wt loss 10/23/2012: Hypothyroidism, unspecified No date: Liver disease No date: Lymphedema of right lower extremity No date: Morbid obesity (HCC) No date: Multiple gastric ulcers No date: Multiple gastric ulcers 10/18/2017: Obstructive sleep apnea syndrome 12/2020: Personal history of COVID-19 No date: PONV (postoperative nausea and vomiting) No date: Pre-diabetes No date: Pulmonary embolism (HCC)     Comment:  HEART CATH DONE 12/2020 AND MRI DONE - PULMONARY EMBOLIS               - GONE No date: Renal disorder     Comment:  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 No date: Seizures (HCC)     Comment:  only had with preeclampsia during pregnancy at age 11.               none since No date: Stress incontinence (female) (female) No date: Stroke [facility])     Comment:  Identified on CT.  No deficits No date: Vitamin B 12 deficiency No date: Vitamin D deficiency  Reproductive/Obstetrics                              Anesthesia Physical Anesthesia Plan  ASA: 3  Anesthesia Plan: General   Post-op Pain Management: Ofirmev IV (intra-op)*   Induction: Intravenous  PONV Risk Score and Plan: 4 or greater and Ondansetron, Dexamethasone, Midazolam and Scopolamine patch - Pre-op  Airway Management Planned: Oral ETT and Video Laryngoscope Planned  Additional Equipment: None  Intra-op  Plan:   Post-operative Plan: Extubation in OR  Informed Consent: I have reviewed the patients History and Physical, chart, labs and discussed the procedure including the risks, benefits and alternatives for the proposed anesthesia with the patient or authorized representative who has indicated his/her understanding and acceptance.     Dental advisory given  Plan Discussed with: CRNA and Surgeon  Anesthesia Plan Comments: (Discussed risks of anesthesia with patient, including PONV, sore throat,  lip/dental/eye damage. Rare risks discussed as well, such as cardiorespiratory and neurological sequelae, and allergic reactions. Discussed the role of CRNA in patient's perioperative care. Patient understands.)         Anesthesia Quick Evaluation

## 2022-11-22 ENCOUNTER — Encounter: Payer: Self-pay | Admitting: Neurosurgery

## 2022-11-22 NOTE — Anesthesia Postprocedure Evaluation (Signed)
Anesthesia Post Note  Patient: Tammy Young  Procedure(s) Performed: C5-7 ANTERIOR CERVICAL DISCECTOMY AND FUSION (Spine Cervical)  Patient location during evaluation: PACU Anesthesia Type: General Level of consciousness: awake and alert Pain management: pain level controlled Vital Signs Assessment: post-procedure vital signs reviewed and stable Respiratory status: spontaneous breathing, nonlabored ventilation and respiratory function stable Cardiovascular status: blood pressure returned to baseline and stable Postop Assessment: no apparent nausea or vomiting Anesthetic complications: no   No notable events documented.   Last Vitals:  Vitals:   11/21/22 1513 11/21/22 1642  BP: 137/64 (!) 145/71  Pulse: 60 64  Resp: 15 16  Temp: (!) 36.1 C (!) 36.1 C  SpO2: 99% 100%    Last Pain:  Vitals:   11/21/22 1642  TempSrc: Temporal  PainSc:                  Foye Deer
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
## 2022-11-26 ENCOUNTER — Encounter: Payer: Self-pay | Admitting: Psychiatry

## 2022-11-26 ENCOUNTER — Other Ambulatory Visit (HOSPITAL_COMMUNITY): Payer: Self-pay

## 2022-11-26 ENCOUNTER — Ambulatory Visit (INDEPENDENT_AMBULATORY_CARE_PROVIDER_SITE_OTHER): Payer: Medicaid Other | Admitting: Psychiatry

## 2022-11-26 ENCOUNTER — Telehealth: Payer: Self-pay | Admitting: Psychiatry

## 2022-11-26 VITALS — BP 133/79 | HR 87 | Temp 97.4°F | Ht 64.5 in | Wt 161.4 lb

## 2022-11-26 DIAGNOSIS — F0630 Mood disorder due to known physiological condition, unspecified: Secondary | ICD-10-CM

## 2022-11-26 DIAGNOSIS — F4310 Post-traumatic stress disorder, unspecified: Secondary | ICD-10-CM | POA: Diagnosis not present

## 2022-11-26 MED ORDER — ARIPIPRAZOLE 5 MG PO TABS: 5.0000 mg | ORAL_TABLET | Freq: Every day | ORAL | 1 refills | Status: DC

## 2022-11-26 MED ORDER — SERTRALINE HCL 25 MG PO TABS: 25.0000 mg | ORAL_TABLET | Freq: Every day | ORAL | 1 refills | Status: DC

## 2022-11-26 NOTE — Telephone Encounter (Signed)
Patient called and said she needs a refill on her Sertraline-please advise

## 2022-11-26 NOTE — Telephone Encounter (Signed)
Thank you. Could you notify the patient that Abilify 5 mg was ordered? Thanks.

## 2022-11-26 NOTE — Telephone Encounter (Signed)
patient called back to let you know she is on 2 mg once daily of the Abilify. said you wanted to know before increasing the medication.

## 2022-11-26 NOTE — Telephone Encounter (Signed)
It is ordered.     thanks

## 2022-11-27 ENCOUNTER — Other Ambulatory Visit (HOSPITAL_COMMUNITY): Payer: Self-pay

## 2022-11-27 NOTE — Telephone Encounter (Signed)
Pt.notified

## 2022-11-27 NOTE — Telephone Encounter (Signed)
Notified patient of medication being sent to pharmacy she stated that she was able to pick the medication up.

## 2022-12-02 ENCOUNTER — Other Ambulatory Visit: Payer: Self-pay | Admitting: Psychiatry

## 2022-12-03 ENCOUNTER — Telehealth: Payer: Self-pay | Admitting: Psychiatry

## 2022-12-03 NOTE — Telephone Encounter (Signed)
gave patient the instructions but patient wanted to know if she continues to take the olanzapine 5mg  and the tizanidine 4mg 

## 2022-12-03 NOTE — Telephone Encounter (Signed)
I will defer tizanidine as it is indicated for other condition. I advised her to discontinue olanzapine previously- please advise her to take olanzapine 2.5 mg at night for one week, then discontinue

## 2022-12-03 NOTE — Telephone Encounter (Signed)
Could you contact the patient and ask what concern she may have? The instruction was as below 1. Increase Abilify 5 mg at night   2. Continue sertraline 25 might  3. Continue hydroxyzine 25 mg daily as needed for anxiety

## 2022-12-03 NOTE — Telephone Encounter (Unsigned)
Patient called and said she wants more clarity on a medication she is taking-please advise

## 2022-12-04 NOTE — Telephone Encounter (Signed)
Pt.notified

## 2022-12-05 ENCOUNTER — Telehealth: Payer: Self-pay

## 2022-12-05 NOTE — Telephone Encounter (Signed)
went online to [email or web address] and submitted the prior auth . - pending 

## 2022-12-05 NOTE — Telephone Encounter (Signed)
received fax that a prior auth was needed for the sertraline hcl 25mg 

## 2022-12-05 NOTE — Telephone Encounter (Signed)
Pt notified. Left message.

## 2022-12-05 NOTE — Telephone Encounter (Signed)
received fax that prior auth was approved from 5-15 to 12-31 tracking ID #[id number]

## 2022-12-06 ENCOUNTER — Ambulatory Visit (INDEPENDENT_AMBULATORY_CARE_PROVIDER_SITE_OTHER): Payer: Medicaid Other | Admitting: Neurosurgery

## 2022-12-06 ENCOUNTER — Ambulatory Visit
Admission: RE | Admit: 2022-12-06 | Discharge: 2022-12-06 | Disposition: A | Discharge status: Home / Self Care | Payer: Medicaid Other | Source: Ambulatory Visit | Attending: Neurosurgery | Admitting: Neurosurgery

## 2022-12-06 ENCOUNTER — Encounter: Payer: Self-pay | Admitting: Neurosurgery

## 2022-12-06 ENCOUNTER — Other Ambulatory Visit: Payer: Self-pay | Admitting: Neurosurgery

## 2022-12-06 ENCOUNTER — Telehealth: Payer: Self-pay

## 2022-12-06 VITALS — BP 130/78 | Temp 98.2°F | Ht 64.5 in | Wt 161.0 lb

## 2022-12-06 DIAGNOSIS — Z981 Arthrodesis status: Secondary | ICD-10-CM

## 2022-12-06 DIAGNOSIS — M5412 Radiculopathy, cervical region: Secondary | ICD-10-CM

## 2022-12-06 DIAGNOSIS — M542 Cervicalgia: Secondary | ICD-10-CM

## 2022-12-06 DIAGNOSIS — Z09 Encounter for follow-up examination after completed treatment for conditions other than malignant neoplasm: Secondary | ICD-10-CM

## 2022-12-06 MED ORDER — OXYCODONE HCL 5 MG PO TABS: 5.0000 mg | ORAL_TABLET | Freq: Three times a day (TID) | ORAL | 0 refills | Status: AC | PRN

## 2022-12-06 MED ORDER — METHYLPREDNISOLONE 4 MG PO TBPK: ORAL_TABLET | ORAL | 0 refills | Status: DC

## 2022-12-06 MED ORDER — DIAZEPAM 2 MG PO TABS: 2.0000 mg | ORAL_TABLET | Freq: Two times a day (BID) | ORAL | 0 refills | Status: DC | PRN

## 2022-12-06 NOTE — Telephone Encounter (Signed)
Authorization for Oxycodone has been initiated through Cover My Meds.   PA Case ID #: [id number]

## 2022-12-06 NOTE — Progress Notes (Signed)
   REFERRING PHYSICIAN:  Luciana Axe, [address] [place],  [place] [address]  DOS: 11/21/22 C5-7 ACDF   HISTORY OF PRESENT ILLNESS: Tammy Young is about 2 weeks status post ACDF. Overall, she is doing well after recent ACDF.  She had an episode about a week ago where she was walking down the stairs and stumbled without falling.  Since then she has had some thoracic back discomfort and increased neck pain.  Prior to this episode she was doing very well and was off all pain medication.  She continues to take oxycodone and Robaxin.  PHYSICAL EXAMINATION:  NEUROLOGICAL:  General: In no acute distress.   Awake, alert, oriented to person, place, and time.  Pupils equal round and reactive to light.  Facial tone is symmetric.  Tongue protrusion is midline.  There is no pronator drift.  Strength: Side Biceps Triceps Deltoid Interossei Grip Wrist Ext. Wrist Flex.  R 5 5 5 5 5 5 5   L 5 5 5 5 5 5 5    Incision c/d/I  Imaging:  12/06/22 cervical xrays Without evidence of hardware complication  Assessment / Plan: FRANCISCO DOHRMANN is doing fair after recent ACDF.  Aspiration of pain and her recent episode as well as her reassuring x-rays, I would like to have her do a steroid Dosepak, Valium at night, and will refill her oxycodone.  We discussed medication side effects.  We discussed activity escalation and I have advised the patient to lift up to 10 pounds until 6 weeks after surgery, then increase up to 25 pounds until 12 weeks after surgery.  After 12 weeks post-op, the patient advised to increase activity as tolerated. she will return to clinic in approximately 4 weeks with cervical x-rays prior to see Dr. Marcell Barlow.  She was encouraged to call the office in the interim should she have any questions or concerns.  She expressed understanding was in agreement with this plan.    Advised to contact the office if any questions or concerns arise.   Manning Charity PA-C Dept of Neurosurgery

## 2022-12-07 NOTE — Telephone Encounter (Signed)
Oxycodone has been approved from 12/06/22 to 06/04/23. I have faxed the approval to the pharmacy.

## 2022-12-10 ENCOUNTER — Telehealth: Payer: Self-pay

## 2022-12-10 NOTE — Telephone Encounter (Signed)
Noted, thanks!

## 2022-12-10 NOTE — Telephone Encounter (Signed)
Patient left voicemail requesting a call back due to having a issue with one of her medications and how she was feeling called patient no answer left voicemail for patient to return call to office.

## 2022-12-11 ENCOUNTER — Other Ambulatory Visit: Payer: Self-pay | Admitting: Psychiatry

## 2022-12-11 ENCOUNTER — Telehealth: Payer: Self-pay

## 2022-12-11 MED ORDER — LAMOTRIGINE 25 MG PO TABS: 25.0000 mg | ORAL_TABLET | Freq: Every day | ORAL | 0 refills | Status: DC

## 2022-12-11 NOTE — Telephone Encounter (Signed)
Spoke to patient she is having some issues since 11/21/22 she stated that she can't stop pacing she is not able to sit down and watch tv she is walking in circles she stated that she is not sure if this is something from being put to sleep when she had her surgery. She sleeps for about 4-5 hours and then she is back up  walking. She was prescribed  Valium and Prednisone by a different provider to help with her pain. She is taking her Hydroxyzine but it is not calming her down at all. She is not sure what to do about this and would like your opinion please advise.

## 2022-12-11 NOTE — Telephone Encounter (Signed)
I have talked with the patient.  She states that she has been feeling agitated.  She walks around in circles, and cannot sit still.  She denies hallucinations, decreased need for sleep.  She denies SI, HI.  She believes her symptoms has worsened since the surgery in May, which may have worsened since uptitration of Abilify.  She agrees with the following.  - Decrease Abilify 2.5 mg daily for one week, then discontinue - Start lamotrigine 25 mg daily.  Discussed potential risk of Stevens-Johnson syndrome.

## 2022-12-11 NOTE — Telephone Encounter (Signed)
Spoke to patient informed her of the message from Dr Vanetta Shawl she would like to know if  this could have anything to do with her switching from Prozac to the Abilify. Please advise

## 2022-12-11 NOTE — Telephone Encounter (Signed)
Although there could be a variety of possibilities, methylprednisolone can worsen mood symptoms, restlessness, and insomnia. I would advise her to discuss this with the provider who prescribed the medication. If her symptoms do not subside despite the change in this medication, we will consider adjusting the medication I am prescribing for her.

## 2022-12-13 ENCOUNTER — Ambulatory Visit: Payer: Medicaid Other | Admitting: Psychiatry

## 2022-12-14 ENCOUNTER — Telehealth: Payer: Self-pay | Admitting: Psychiatry

## 2022-12-14 NOTE — Telephone Encounter (Signed)
My guess is that Abilify is causing this restlessness. Hope her symptoms subside after tapering off/discontinuation of Abilify.

## 2022-12-14 NOTE — Telephone Encounter (Signed)
Patient called back wanting you to know she is still pacing the floor. Knows the medication may take a while but would like to consider a different medication. Please call her

## 2022-12-14 NOTE — Telephone Encounter (Signed)
Called patient and gave her the directions per dr. Vanetta Shawl order but pt states that the lamotrigine is making her not sit down and moving around a lot. she fells on edge.

## 2022-12-14 NOTE — Telephone Encounter (Signed)
pt was called and direction were given but patient states she feel on edge, and can't sit down.

## 2022-12-18 ENCOUNTER — Telehealth: Payer: Self-pay | Admitting: Psychiatry

## 2022-12-18 ENCOUNTER — Other Ambulatory Visit: Payer: Self-pay | Admitting: Psychiatry

## 2022-12-18 NOTE — Telephone Encounter (Signed)
It has been only a week since adjustment in the medication, and it takes time for medication to be fully effective. Please advise her to stay on the current medication as advised until the next visit.

## 2022-12-18 NOTE — Telephone Encounter (Signed)
Patient called and said the new medication is not working and that her son said she appears "fake" with her emotions. Patient asked to change to Prozac-Please advise

## 2022-12-20 NOTE — Telephone Encounter (Signed)
Spoke to patient she stated that she is very anxious and can not sleep she still can not sit still. She also stated that she is taking Prozac, Hydroxyzine, Lamotrigine during the day and she is also taking the Zoloft at night. She did stop the Abilify.

## 2022-12-20 NOTE — Telephone Encounter (Signed)
Please advise her not to take Fluoxetine and see how it goes. It was discontinued when sertraline was started.

## 2022-12-20 NOTE — Telephone Encounter (Signed)
Pt.notified

## 2022-12-20 NOTE — Telephone Encounter (Signed)
Spoke to patient she stated that she seems to do better on the Prozac and would like to continue

## 2022-12-20 NOTE — Telephone Encounter (Signed)
What is the dose she is taking? If she prefers Prozac, please advise her to discontinue sertraline to avoid polypharmacy.

## 2022-12-21 ENCOUNTER — Other Ambulatory Visit: Payer: Self-pay | Admitting: Psychiatry

## 2022-12-21 MED ORDER — FLUOXETINE HCL 40 MG PO CAPS: 40.0000 mg | ORAL_CAPSULE | Freq: Every day | ORAL | 0 refills | Status: DC

## 2022-12-21 NOTE — Telephone Encounter (Signed)
Spoke to patient she stated that she is taking Prozac 40 mg and she will need a refill sent to her pharmacy [facility] in [place].

## 2022-12-21 NOTE — Telephone Encounter (Signed)
Ordered. Please contact the pharmacy and cancel sertraline order.

## 2022-12-22 ENCOUNTER — Other Ambulatory Visit: Payer: Self-pay | Admitting: Psychiatry

## 2022-12-24 NOTE — Telephone Encounter (Signed)
Called pharmacy spoke to [place] he stated that he would cancel the Sertraline

## 2022-12-27 ENCOUNTER — Other Ambulatory Visit (HOSPITAL_COMMUNITY): Payer: Self-pay

## 2022-12-31 ENCOUNTER — Other Ambulatory Visit: Payer: Self-pay

## 2022-12-31 DIAGNOSIS — M542 Cervicalgia: Secondary | ICD-10-CM

## 2023-01-01 ENCOUNTER — Ambulatory Visit (INDEPENDENT_AMBULATORY_CARE_PROVIDER_SITE_OTHER): Payer: Medicaid Other | Admitting: Neurosurgery

## 2023-01-01 ENCOUNTER — Ambulatory Visit
Admission: RE | Admit: 2023-01-01 | Discharge: 2023-01-01 | Disposition: A | Discharge status: Home / Self Care | Payer: Medicaid Other | Attending: Neurosurgery | Admitting: Neurosurgery

## 2023-01-01 ENCOUNTER — Encounter: Payer: Self-pay | Admitting: Neurosurgery

## 2023-01-01 ENCOUNTER — Ambulatory Visit
Admission: RE | Admit: 2023-01-01 | Discharge: 2023-01-01 | Disposition: A | Discharge status: Home / Self Care | Payer: Medicaid Other | Source: Ambulatory Visit | Attending: Neurosurgery | Admitting: Neurosurgery

## 2023-01-01 VITALS — BP 140/75 | HR 77 | Temp 98.5°F | Wt 168.6 lb

## 2023-01-01 DIAGNOSIS — M542 Cervicalgia: Secondary | ICD-10-CM

## 2023-01-01 DIAGNOSIS — Z981 Arthrodesis status: Secondary | ICD-10-CM

## 2023-01-01 DIAGNOSIS — R29898 Other symptoms and signs involving the musculoskeletal system: Secondary | ICD-10-CM

## 2023-01-01 DIAGNOSIS — M5412 Radiculopathy, cervical region: Secondary | ICD-10-CM

## 2023-01-01 NOTE — Progress Notes (Signed)
   REFERRING PHYSICIAN:  Luciana Axe, Np [address] [place],  [place] [address]  DOS: 11/21/22 C5-7 ACDF   HISTORY OF PRESENT ILLNESS: BRIEL GALLICCHIO is  status post ACDF. Overall, she is doing well after recent ACDF.   She did have a fall that has left her with some tingling in her hands and feet.  PHYSICAL EXAMINATION:  NEUROLOGICAL:  General: In no acute distress.   Awake, alert, oriented to person, place, and time.  Pupils equal round and reactive to light.  Facial tone is symmetric.  Tongue protrusion is midline.  There is no pronator drift.  Strength: Side Biceps Triceps Deltoid Interossei Grip Wrist Ext. Wrist Flex.  R 5 5 5 5 5 5 5   L 5 5 5 5 5 5 5    Incision c/d/I  Imaging:  12/06/22 and January 01, 2023 cervical xrays Without evidence of hardware complication  Assessment / Plan: JODELL WEITMAN is doing fair after recent ACDF.  She had a flare of symptoms after a fall.  I am hopeful that this will improve over time.  I would like to start her on exercises for her neck.  If she is not better in 6 weeks, we will consider repeating her MRI scan.     Venetia Night MD Dept of Neurosurgery

## 2023-01-02 ENCOUNTER — Telehealth: Payer: Self-pay | Admitting: Psychiatry

## 2023-01-02 NOTE — Telephone Encounter (Signed)
Patient's next appointment is June 20. Patient called requesting refill for Sertraline medication. Patient stated she has 2 pills left-please advise
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
## 2023-01-02 NOTE — Telephone Encounter (Signed)
Patient is confused about what she is supposed to be taking she said that she does remember me calling but did not know  which medication to stop after the phone conversation her next appt is virtual and she is requesting that it be changed to in person because she would like to go over her medications with Dr Vanetta Shawl. Also mentioned to patient that she should not be taking both medications only the fluoxetine she voiced understanding

## 2023-01-02 NOTE — Telephone Encounter (Signed)
I am not available for an in-person visit that day. However, June 18th may be an option for an in-person appointment. I will include the front desk staff here to check if we have any sooner availability for in person visit.

## 2023-01-02 NOTE — Telephone Encounter (Signed)
I believe the patient was notified to stay on fluoxetine only, given her strong preference, and it was advised to discontinue sertraline. Could you contact her to find out which medication she is currently taking? If she is taking both fluoxetine and sertraline, please advise her to discontinue sertraline to avoid polypharmacy.

## 2023-01-03 ENCOUNTER — Encounter: Payer: Self-pay | Admitting: Psychiatry

## 2023-01-03 ENCOUNTER — Encounter: Payer: Medicaid Other | Admitting: Neurosurgery

## 2023-01-03 ENCOUNTER — Ambulatory Visit (INDEPENDENT_AMBULATORY_CARE_PROVIDER_SITE_OTHER): Payer: Medicaid Other | Admitting: Psychiatry

## 2023-01-03 VITALS — BP 130/77 | HR 64 | Temp 97.5°F | Ht 64.5 in | Wt 165.6 lb

## 2023-01-03 DIAGNOSIS — F411 Generalized anxiety disorder: Secondary | ICD-10-CM

## 2023-01-03 DIAGNOSIS — F0630 Mood disorder due to known physiological condition, unspecified: Secondary | ICD-10-CM

## 2023-01-03 DIAGNOSIS — F4310 Post-traumatic stress disorder, unspecified: Secondary | ICD-10-CM

## 2023-01-03 MED ORDER — FLUOXETINE HCL 20 MG PO CAPS: 20.0000 mg | ORAL_CAPSULE | Freq: Every day | ORAL | 0 refills | Status: DC

## 2023-01-03 MED ORDER — HYDROXYZINE HCL 25 MG PO TABS: 25.0000 mg | ORAL_TABLET | Freq: Two times a day (BID) | ORAL | 1 refills | Status: DC | PRN

## 2023-01-03 MED ORDER — LAMOTRIGINE 25 MG PO TABS: 50.0000 mg | ORAL_TABLET | Freq: Every day | ORAL | 1 refills | Status: DC

## 2023-01-03 NOTE — Patient Instructions (Signed)
Increase lamotrigine 50 mg daily   Increase hydroxyzine 25 mg twice a day as needed for anxiety  Decrease fluoxetine 20 mg daily for one week, then discontinue Hold sertraline Next appointment: 8/5 at 8 Am

## 2023-01-07 ENCOUNTER — Telehealth: Payer: Self-pay | Admitting: Psychiatry

## 2023-01-07 MED ORDER — QUETIAPINE FUMARATE 25 MG PO TABS: 25.0000 mg | ORAL_TABLET | Freq: Every day | ORAL | 1 refills | Status: DC

## 2023-01-07 NOTE — Telephone Encounter (Signed)
I discussed with the patient, and she states that she experiences panic attacks in the morning. She is unsure if this is due to lamotrigine. She recalls not feeling well when she was on this medication, although she cannot specify what happened. She reports an increase in appetite and has insomnia but denies suicidal ideation. She agrees with the following plan:  - Discontinue lamotrigine. - Start quetiapine 25 mg at night. Discussed the potential risks of increased appetite, weight gain, and extrapyramidal symptoms (EPS). - Keep the appointment in August

## 2023-01-07 NOTE — Telephone Encounter (Signed)
I called spoke to patient she was referring to Quetiapine I informed her of the message she voiced understanding

## 2023-01-07 NOTE — Telephone Encounter (Signed)
Called the patient. Details is documented in her chart.

## 2023-01-07 NOTE — Telephone Encounter (Signed)
Patient called and had a question about her new anxiety medication. She wants to know if the medication will last until later on in the day when she starts feeling anxious.-Please advise

## 2023-01-07 NOTE — Telephone Encounter (Signed)
I believe she is referring to quetiapine. Yes, the effect would last for a day.

## 2023-01-07 NOTE — Telephone Encounter (Signed)
Patient called stating every morning when she wakes up it feels like she is having anxiety attacks. She is shaky, heart beating fast, just wants to lay in the bed when this happens and stays this way most of the day. She [place] unsure is it is the Lamotrigine. Please advise

## 2023-01-10 ENCOUNTER — Telehealth: Payer: Medicaid Other | Admitting: Psychiatry

## 2023-01-18 ENCOUNTER — Other Ambulatory Visit: Payer: Self-pay | Admitting: Psychiatry

## 2023-01-18 ENCOUNTER — Telehealth: Payer: Self-pay | Admitting: Psychiatry

## 2023-01-18 MED ORDER — QUETIAPINE FUMARATE 50 MG PO TABS: 50.0000 mg | ORAL_TABLET | Freq: Every day | ORAL | 0 refills | Status: DC

## 2023-01-18 NOTE — Telephone Encounter (Signed)
Called pharmacy spoke to [place] she stated that the patient picked up the quetiapine 25 mg on 01/07/23 she will cancel the prescription and any remaining refills.

## 2023-01-18 NOTE — Telephone Encounter (Signed)
Patient called and stated she is still taking prozac because the  hydroxyzine is not working. She would like to know what she should do about her anxiety. Patient's next appointment is on 7/8.-Please Advise

## 2023-01-18 NOTE — Telephone Encounter (Signed)
Please advise her not to self-adjust her medication, as it has been very difficult to determine whether she is experiencing side effects from certain medications. Ask her to hold off on taking fluoxetine at this time, as we discussed at her last visit. Please also ask if she is taking quetiapine 25 mg at night as discussed. If she is taking this and has not experienced any drowsiness, she can increase the dose to 50 mg at night (take two tablets) until her next visit for anxiety.
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
## 2023-01-25 ENCOUNTER — Other Ambulatory Visit: Payer: Self-pay | Admitting: Psychiatry

## 2023-01-28 ENCOUNTER — Ambulatory Visit (INDEPENDENT_AMBULATORY_CARE_PROVIDER_SITE_OTHER): Payer: Medicaid Other | Admitting: Psychiatry

## 2023-01-28 ENCOUNTER — Encounter: Payer: Self-pay | Admitting: Psychiatry

## 2023-01-28 VITALS — BP 154/83 | HR 54 | Temp 97.5°F | Ht 64.5 in | Wt 165.4 lb

## 2023-01-28 DIAGNOSIS — F0630 Mood disorder due to known physiological condition, unspecified: Secondary | ICD-10-CM | POA: Diagnosis not present

## 2023-01-28 DIAGNOSIS — F411 Generalized anxiety disorder: Secondary | ICD-10-CM | POA: Diagnosis not present

## 2023-01-28 DIAGNOSIS — F4310 Post-traumatic stress disorder, unspecified: Secondary | ICD-10-CM

## 2023-01-28 MED ORDER — QUETIAPINE FUMARATE 100 MG PO TABS: 100.0000 mg | ORAL_TABLET | Freq: Every day | ORAL | 1 refills | Status: DC

## 2023-02-08 ENCOUNTER — Other Ambulatory Visit: Payer: Self-pay

## 2023-02-08 DIAGNOSIS — M5412 Radiculopathy, cervical region: Secondary | ICD-10-CM

## 2023-02-08 DIAGNOSIS — M542 Cervicalgia: Secondary | ICD-10-CM

## 2023-02-14 ENCOUNTER — Ambulatory Visit
Admission: RE | Admit: 2023-02-14 | Discharge: 2023-02-14 | Disposition: A | Discharge status: Home / Self Care | Payer: Medicaid Other | Source: Ambulatory Visit | Attending: Neurosurgery | Admitting: Neurosurgery

## 2023-02-14 ENCOUNTER — Telehealth: Payer: Self-pay | Admitting: Psychiatry

## 2023-02-14 ENCOUNTER — Ambulatory Visit (INDEPENDENT_AMBULATORY_CARE_PROVIDER_SITE_OTHER): Payer: Medicaid Other | Admitting: Neurosurgery

## 2023-02-14 ENCOUNTER — Encounter: Payer: Self-pay | Admitting: Neurosurgery

## 2023-02-14 ENCOUNTER — Other Ambulatory Visit: Payer: Self-pay | Admitting: Psychiatry

## 2023-02-14 ENCOUNTER — Ambulatory Visit
Admission: RE | Admit: 2023-02-14 | Discharge: 2023-02-14 | Disposition: A | Discharge status: Home / Self Care | Payer: Medicaid Other | Attending: Neurosurgery | Admitting: Neurosurgery

## 2023-02-14 VITALS — BP 124/74 | HR 82 | Temp 98.6°F | Wt 165.0 lb

## 2023-02-14 DIAGNOSIS — M5412 Radiculopathy, cervical region: Secondary | ICD-10-CM | POA: Insufficient documentation

## 2023-02-14 DIAGNOSIS — Z981 Arthrodesis status: Secondary | ICD-10-CM

## 2023-02-14 DIAGNOSIS — M542 Cervicalgia: Secondary | ICD-10-CM

## 2023-02-14 DIAGNOSIS — M5010 Cervical disc disorder with radiculopathy, unspecified cervical region: Secondary | ICD-10-CM | POA: Diagnosis not present

## 2023-02-14 MED ORDER — HYDROXYZINE HCL 25 MG PO TABS: 25.0000 mg | ORAL_TABLET | Freq: Three times a day (TID) | ORAL | 0 refills | Status: DC | PRN

## 2023-02-14 NOTE — Progress Notes (Signed)
   REFERRING PHYSICIAN:  Luciana Axe, [address] [place],  [place] [address]  DOS: 11/21/22 C5-7 ACDF   HISTORY OF PRESENT ILLNESS:  02/14/23 Tammy Young presents today for 3 month follow up after ACDF.  She states that her symptoms are no better than they were before surgery.  She currently complains of neck pain that radiates to both shoulders and pain radiating down her right arm with associated numbness into her thumb pinky and index finger. This is significantly impacting her quality of life.  01/01/23 Dr. Rogue Jury Andres Ege is  status post ACDF. Overall, she is doing well after recent ACDF.   She did have a fall that has left her with some tingling in her hands and feet.  PHYSICAL EXAMINATION:  NEUROLOGICAL:  General: In no acute distress.   Awake, alert, oriented to person, place, and time.  Pupils equal round and reactive to light.    Strength: Side Biceps Triceps Deltoid Interossei Grip Wrist Ext. Wrist Flex.  R 5 5 5 5 5 5 5   L 5 5 5 5 5 5 5    Incision well-healed  Imaging:  02/14/23 xray c spine  Without evidence of hardware complication  Assessment / Plan: Tammy Young is doing poorly after recent ACDF.  While she did have about a week or 2 of improved radiating arm pain after surgery, her fall has caused persistent pain since which is just as bad as it was before surgery.  I would like to obtain a cervical MRI to evaluate for ongoing nerve compression explains her symptoms.  If this is largely negative we may obtain an EMG and consider ESI's and physical therapy.  I will contact her with the results of her MRI and further plan of care.  She will need to follow-up with Dr. Marcell Barlow in about 3 months.  I spent a total of 30 minutes in both face-to-face and non-face-to-face activities for this visit on the date of this encounter.   Manning Charity PA-C Dept of Neurosurgery

## 2023-02-14 NOTE — Telephone Encounter (Signed)
Patient called stated she is taking hydroxyzine for anxiety but she is still feeling nervous and "shaky". Patient asked if medication dosage should be increased.Please advise

## 2023-02-14 NOTE — Telephone Encounter (Signed)
Please inform her that she can take hydroxyzine up to 25 mg three times a day as needed for anxiety. A new order has been sent in. Advise her to hold the medication if it causes drowsiness.

## 2023-02-14 NOTE — Telephone Encounter (Signed)
Spoke to patient she  voiced understanding and stated that she will call the office if she has any other issues

## 2023-02-18 ENCOUNTER — Telehealth: Payer: Self-pay | Admitting: Orthopedic Surgery

## 2023-02-18 DIAGNOSIS — M5010 Cervical disc disorder with radiculopathy, unspecified cervical region: Secondary | ICD-10-CM

## 2023-02-18 DIAGNOSIS — Z981 Arthrodesis status: Secondary | ICD-10-CM

## 2023-02-18 NOTE — Telephone Encounter (Signed)
You saw her last week and said that you would order a MRI cervical. No one has called her to schedule that yet. She is in a lot of pain. She is taking oxycodone that she had left over from a previous surgery and is almost out. Can you prescribe her something for pain? [facility] [place]

## 2023-02-18 NOTE — Telephone Encounter (Signed)
The MRI order was not placed so I put in a MRI cervical w/o contrast.  She is also asking if you could give her something for pain?

## 2023-02-19 ENCOUNTER — Other Ambulatory Visit: Payer: Self-pay | Admitting: Psychiatry

## 2023-02-19 ENCOUNTER — Other Ambulatory Visit: Payer: Self-pay | Admitting: Neurosurgery

## 2023-02-19 MED ORDER — OXYCODONE HCL 5 MG PO TABS: 5.0000 mg | ORAL_TABLET | Freq: Two times a day (BID) | ORAL | 0 refills | Status: AC | PRN

## 2023-02-19 NOTE — Progress Notes (Signed)
PDMP reviewed and appropriate

## 2023-02-21 ENCOUNTER — Telehealth: Payer: Self-pay | Admitting: Psychiatry

## 2023-02-21 NOTE — Telephone Encounter (Signed)
Called patient informed her of the message she stated that she did not pick the medication up and voiced understanding

## 2023-02-21 NOTE — Telephone Encounter (Signed)
Patient called stating she got an order sent for fluoxetine by our office. Patient stated she is not sure if she should take that medication because she was told not to. Patient stated she is confused on what to do. Next appointment is 8/26.-Please Advise

## 2023-02-23 ENCOUNTER — Ambulatory Visit: Payer: Medicaid Other

## 2023-02-25 ENCOUNTER — Ambulatory Visit: Payer: Medicaid Other | Admitting: Psychiatry

## 2023-03-02 ENCOUNTER — Ambulatory Visit: Payer: Medicaid Other

## 2023-03-08 ENCOUNTER — Telehealth: Payer: Self-pay | Admitting: Neurosurgery

## 2023-03-08 ENCOUNTER — Ambulatory Visit
Admission: RE | Admit: 2023-03-08 | Discharge: 2023-03-08 | Disposition: A | Discharge status: Home / Self Care | Payer: Medicaid Other | Source: Ambulatory Visit | Attending: Neurosurgery | Admitting: Neurosurgery

## 2023-03-08 DIAGNOSIS — M5010 Cervical disc disorder with radiculopathy, unspecified cervical region: Secondary | ICD-10-CM | POA: Diagnosis present

## 2023-03-08 DIAGNOSIS — Z981 Arthrodesis status: Secondary | ICD-10-CM | POA: Diagnosis not present

## 2023-03-08 NOTE — Telephone Encounter (Signed)
Patient is calling that she has her MRI scan today. She saw [facility] and they told her nothing is wrong with her inner ear so they are not sure what is causing her dizziness. She just saw Carollee Herter her PCP and she feels her dizziness and falling is something to do with her neck. Can you please contact her as soon as you receive her results.

## 2023-03-11 ENCOUNTER — Encounter: Payer: Self-pay | Admitting: Neurosurgery
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
## 2023-03-11 NOTE — Telephone Encounter (Signed)
Patient calling that her  MRI results are in her chart now. She is in a lot of pain and would like a call back ASAP.

## 2023-03-12 ENCOUNTER — Other Ambulatory Visit: Payer: Self-pay | Admitting: Neurosurgery

## 2023-03-12 ENCOUNTER — Ambulatory Visit (INDEPENDENT_AMBULATORY_CARE_PROVIDER_SITE_OTHER): Payer: Medicaid Other | Admitting: Psychiatry

## 2023-03-12 ENCOUNTER — Encounter: Payer: Self-pay | Admitting: Psychiatry

## 2023-03-12 ENCOUNTER — Telehealth: Payer: Self-pay

## 2023-03-12 VITALS — BP 111/71 | HR 71 | Temp 97.6°F | Ht 64.5 in | Wt 170.4 lb

## 2023-03-12 DIAGNOSIS — F0630 Mood disorder due to known physiological condition, unspecified: Secondary | ICD-10-CM

## 2023-03-12 DIAGNOSIS — F4310 Post-traumatic stress disorder, unspecified: Secondary | ICD-10-CM

## 2023-03-12 DIAGNOSIS — F411 Generalized anxiety disorder: Secondary | ICD-10-CM

## 2023-03-12 MED ORDER — OXYCODONE HCL 5 MG PO TABS: 5.0000 mg | ORAL_TABLET | Freq: Two times a day (BID) | ORAL | 0 refills | Status: AC | PRN

## 2023-03-12 MED ORDER — LURASIDONE HCL 20 MG PO TABS: 20.0000 mg | ORAL_TABLET | Freq: Every day | ORAL | 1 refills | Status: DC

## 2023-03-12 MED ORDER — HYDROXYZINE HCL 25 MG PO TABS: 25.0000 mg | ORAL_TABLET | Freq: Three times a day (TID) | ORAL | 1 refills | Status: DC | PRN

## 2023-03-12 NOTE — Telephone Encounter (Signed)
Patient is calling that she is in a lot of pain and would like her results soon.
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
## 2023-03-12 NOTE — Progress Notes (Signed)
PDMP reviewed and appropriate. Oxycodone 5mg  BID PRN refilled.

## 2023-03-12 NOTE — Patient Instructions (Signed)
Start latuda 20 mg daily  Decrease quetiapine 25 mg at night for one week, then discontinue  Continue hydroxyzine 25 mg three times a day as needed for anxiety  Next appointment: 10/8 at 11 am

## 2023-03-12 NOTE — Telephone Encounter (Signed)
She reached out to me through [employer], and I replied to address her concerns. Will wait to hear back from her.

## 2023-03-12 NOTE — Telephone Encounter (Signed)
pt called states she seen you today and you wanted to start her on latuda that you said it would not cause weight gain. but she was reading that it does cause weitght gain and she wants to speak with you about maybe doing something else.

## 2023-03-14 ENCOUNTER — Ambulatory Visit (INDEPENDENT_AMBULATORY_CARE_PROVIDER_SITE_OTHER): Payer: Medicaid Other | Admitting: Neurosurgery

## 2023-03-14 DIAGNOSIS — Z981 Arthrodesis status: Secondary | ICD-10-CM | POA: Diagnosis not present

## 2023-03-14 DIAGNOSIS — M5412 Radiculopathy, cervical region: Secondary | ICD-10-CM

## 2023-03-14 NOTE — Progress Notes (Signed)
Neurosurgery Telephone (Audio-Only) Note  Requesting Provider     Luciana Axe, NP [address] [place],  [place] [address] T: [phone] F: [phone]  Primary Care Provider Luciana Axe, NP [address] [place] [address] T: [phone] F: [phone]  Telehealth visit was conducted with Tammy Young, a 57 y.o. female via telephone.  History of Present Illness: Tammy Young is a 57 year old with a history of C5-7 ACDF on 11/21/2022.  Unfortunately she has had persistent neck pain and radiating shoulder pain with radiating pain down her right arm.  Because of this we updated a cervical MRI which shows some questionable ongoing compression.  Today she reports her symptoms are worse than they were before surgery.  She is taking oxycodone which does provide some relief.  General Review of Systems:  A ROS was performed including pertinent positive and negatives as documented.  All other systems are negative.   Prior to Admission medications   Medication Sig Start Date End Date Taking? Authorizing Provider  oxyCODONE (ROXICODONE) 5 MG immediate release tablet Take 1 tablet (5 mg total) by mouth 2 (two) times daily as needed for up to 15 days for severe pain. 03/12/23 03/27/23  Susanne Borders, PA  albuterol (PROVENTIL) (2.5 MG/3ML) 0.083% nebulizer solution Inhale 2.5 mg into the lungs every 4 (four) hours as needed for wheezing or shortness of breath.  06/29/19 11/08/22  [provider]  CALCIUM PO Take 1,500 mg by mouth daily.    [provider]  Cyanocobalamin (VITAMIN B-12 IJ) Inject 1,000 mcg as directed every 30 (thirty) days.    [provider]  ergocalciferol (VITAMIN D2) 1.25 MG (50000 UT) capsule Take 50,000 Units by mouth once a week.    [provider]  fluticasone-salmeterol (ADVAIR) 100-50 MCG/ACT AEPB Inhale 1 puff into the lungs 2 (two) times daily.    [provider]  hydrOXYzine (ATARAX) 25 MG tablet  Take 1 tablet (25 mg total) by mouth 3 (three) times daily as needed for anxiety. 02/25/23   Neysa Hotter, MD  hydrOXYzine (ATARAX) 25 MG tablet Take 1 tablet (25 mg total) by mouth 3 (three) times daily as needed for anxiety. 03/16/23 05/15/23  Neysa Hotter, MD  lurasidone (LATUDA) 20 MG TABS tablet Take 1 tablet (20 mg total) by mouth daily. 03/12/23 05/11/23  Neysa Hotter, MD  methocarbamol (ROBAXIN) 500 MG tablet Take 500 mg by mouth once.    [provider]  Multiple Vitamins-Minerals (BARIATRIC MULTIVITAMINS/IRON PO) Take by mouth daily.    [provider]  pantoprazole (PROTONIX) 40 MG tablet Take 1 tablet (40 mg total) by mouth daily. 04/05/21   Berna Bue, MD  propranolol (INDERAL) 10 MG tablet Take 10 mg by mouth 4 (four) times daily as needed. 01/11/23 01/11/24  [provider]  senna (SENOKOT) 8.6 MG TABS tablet Take 1 tablet (8.6 mg total) by mouth daily as needed. 11/21/22   Susanne Borders, PA  SUMAtriptan (IMITREX) 100 MG tablet Take 100 mg by mouth every 2 (two) hours as needed for migraine. 03/12/19   [provider]  valACYclovir (VALTREX) 500 MG tablet Take 500 mg by mouth 2 (two) times daily as needed (outbreak).  10/10/15   [provider]  fexofenadine (ALLEGRA) 180 MG tablet Take 180 mg by mouth daily. Patient not taking: Reported on 01/11/2020 07/19/19 01/20/20  [provider]  RABEprazole (ACIPHEX) 20 MG tablet Take 20 mg by mouth daily. 07/19/19 01/20/20  [provider]  ranitidine (ZANTAC) 150 MG tablet Take 150 mg by mouth at bedtime. Patient not taking: Reported on 01/11/2020  01/20/20  [provider]    DATA REVIEWED    Imaging Studies  MRI C spine 03/08/23   FINDINGS: Alignment: Exaggeration of the normal cervical lordosis. Trace retrolisthesis of C4 on C5.   Vertebrae: Prior ACDF at C5-C7. Vertebral body height maintained without acute or chronic fracture. Bone marrow signal  intensity within normal limits. No discrete or worrisome osseous lesions. No abnormal marrow edema.   Cord: Normal signal and morphology.   Posterior Fossa, vertebral arteries, paraspinal tissues: Unremarkable.   Disc levels:   C2-C3: Unremarkable.   C3-C4: Small central disc protrusion mildly indents the ventral thecal sac. No spinal stenosis. Foramina remain patent.   C4-C5: Mild disc bulge with uncovertebral hypertrophy. Flattening of the ventral thecal sac without significant spinal stenosis. Mild right greater than left C5 foraminal narrowing.   C5-C6: Prior fusion. No residual spinal stenosis. Uncovertebral spurring with residual mild to moderate left greater than right C6 foraminal narrowing.   C6-C7: Prior fusion. No residual spinal stenosis. Foramina appear patent.   C7-T1:  Normal interspace.  No canal or foraminal stenosis.   IMPRESSION: 1. Prior ACDF at C5-C7 without residual spinal stenosis. Uncovertebral spurring at C5-6 with residual mild to moderate left greater than right C6 foraminal narrowing. 2. Mild disc bulge with uncovertebral hypertrophy at C4-5 with resultant mild right greater than left C5 foraminal stenosis. 3. Small central disc protrusion at C3-4 without stenosis.     Electronically Signed   By: Rise Mu M.D.   On: 03/10/2023 18:38 IMPRESSION  Tammy Young is a 57 y.o. female who I performed a telephone encounter today for evaluation and management of: Ongoing cervical radiculopathy  PLAN  Tammy Young is a pleasant 57 year old presenting with persistent cervical radiculopathy.  I reviewed her imaging and symptoms with Dr. Marcell Barlow.  There is some concern that she has ongoing foraminal narrowing at C5-6 causing a C6 radiculopathy.  In reviewing things with Dr. Marcell Barlow, he recommended her attempting physical therapy which she would like to do at [facility] and [place] as well as trying a diagnostic C5-6 transforaminal ESI.  She is  currently scheduled for a follow-up with Dr. Myer Haff in September and we will keep this appointment.  She will keep our office apprised on her response to physical therapy and injections.  No orders of the defined types were placed in this encounter.    DISPOSITION  Follow up: In person appointment in  I currently scheduled follow-up with Dr. Vilma Prader, PA Neurosurgery  TELEPHONE DOCUMENTATION   This visit was performed via telephone.  Patient location: home Provider location: office  I spent a total of 5 minutes non-face-to-face activities for this visit on the date of this encounter including review of current clinical condition and response to treatment.  The patient is aware of and accepts the limits of this telehealth visit.

## 2023-03-18 ENCOUNTER — Ambulatory Visit: Payer: Medicaid Other | Admitting: Psychiatry

## 2023-04-01 ENCOUNTER — Other Ambulatory Visit: Payer: Self-pay | Admitting: Family Medicine

## 2023-04-01 DIAGNOSIS — M5412 Radiculopathy, cervical region: Secondary | ICD-10-CM

## 2023-04-08 ENCOUNTER — Other Ambulatory Visit: Payer: Self-pay | Admitting: Physical Medicine & Rehabilitation

## 2023-04-08 ENCOUNTER — Encounter: Payer: Self-pay | Admitting: Physical Medicine & Rehabilitation

## 2023-04-08 ENCOUNTER — Telehealth: Payer: Self-pay | Admitting: Psychiatry

## 2023-04-08 DIAGNOSIS — M5412 Radiculopathy, cervical region: Secondary | ICD-10-CM

## 2023-04-08 NOTE — Telephone Encounter (Signed)
Please contact the patient and inform her that she should start taking Latuda 20 mg daily and continue with this dose. The prescription was for 20 mg, so she should maintain this dosage until her next appointment.

## 2023-04-08 NOTE — Telephone Encounter (Signed)
Pt.notified

## 2023-04-08 NOTE — Telephone Encounter (Signed)
Patient states you mentioned increasing the latuda. She has a refill on it, but before she pays for another prescription advise on how you want to proceed? Increase what she has or put in a new prescription? Please advise

## 2023-04-09 ENCOUNTER — Ambulatory Visit (INDEPENDENT_AMBULATORY_CARE_PROVIDER_SITE_OTHER): Payer: Medicaid Other | Admitting: Neurosurgery

## 2023-04-09 ENCOUNTER — Encounter: Payer: Self-pay | Admitting: Neurosurgery

## 2023-04-09 ENCOUNTER — Encounter: Payer: Self-pay | Admitting: Physical Medicine & Rehabilitation

## 2023-04-09 ENCOUNTER — Ambulatory Visit
Admission: RE | Admit: 2023-04-09 | Discharge: 2023-04-09 | Disposition: A | Discharge status: Home / Self Care | Payer: Medicaid Other | Source: Ambulatory Visit | Attending: Neurosurgery | Admitting: Neurosurgery

## 2023-04-09 ENCOUNTER — Ambulatory Visit
Admission: RE | Admit: 2023-04-09 | Discharge: 2023-04-09 | Disposition: A | Discharge status: Home / Self Care | Payer: Medicaid Other | Attending: Neurosurgery | Admitting: Neurosurgery

## 2023-04-09 VITALS — BP 115/80 | Ht 64.5 in | Wt 170.6 lb

## 2023-04-09 DIAGNOSIS — M5412 Radiculopathy, cervical region: Secondary | ICD-10-CM | POA: Insufficient documentation

## 2023-04-09 DIAGNOSIS — M4802 Spinal stenosis, cervical region: Secondary | ICD-10-CM

## 2023-04-09 DIAGNOSIS — Z09 Encounter for follow-up examination after completed treatment for conditions other than malignant neoplasm: Secondary | ICD-10-CM | POA: Diagnosis not present

## 2023-04-09 DIAGNOSIS — Z981 Arthrodesis status: Secondary | ICD-10-CM | POA: Diagnosis not present

## 2023-04-09 NOTE — Progress Notes (Signed)
   Neurosurgery Note    Primary Care Provider Luciana Axe, NP [address] [place] [address] T: [phone] F: [phone]   History of Present Illness: Tammy Young is a 57 year old with a history of C5-7 ACDF on 11/21/2022.  Unfortunately she has had continued neck and arm pain.  She has pain all over her body.  She also has bilateral lower extremity pain.   General Review of Systems:  A ROS was performed including pertinent positive and negatives as documented.  All other systems are negative.   DATA REVIEWED    Imaging Studies  MRI C spine 03/08/23   FINDINGS: Alignment: Exaggeration of the normal cervical lordosis. Trace retrolisthesis of C4 on C5.   Vertebrae: Prior ACDF at C5-C7. Vertebral body height maintained without acute or chronic fracture. Bone marrow signal intensity within normal limits. No discrete or worrisome osseous lesions. No abnormal marrow edema.   Cord: Normal signal and morphology.   Posterior Fossa, vertebral arteries, paraspinal tissues: Unremarkable.   Disc levels:   C2-C3: Unremarkable.   C3-C4: Small central disc protrusion mildly indents the ventral thecal sac. No spinal stenosis. Foramina remain patent.   C4-C5: Mild disc bulge with uncovertebral hypertrophy. Flattening of the ventral thecal sac without significant spinal stenosis. Mild right greater than left C5 foraminal narrowing.   C5-C6: Prior fusion. No residual spinal stenosis. Uncovertebral spurring with residual mild to moderate left greater than right C6 foraminal narrowing.   C6-C7: Prior fusion. No residual spinal stenosis. Foramina appear patent.   C7-T1:  Normal interspace.  No canal or foraminal stenosis.   IMPRESSION: 1. Prior ACDF at C5-C7 without residual spinal stenosis. Uncovertebral spurring at C5-6 with residual mild to moderate left greater than right C6 foraminal narrowing. 2. Mild disc bulge with uncovertebral hypertrophy at C4-5  with resultant mild right greater than left C5 foraminal stenosis. 3. Small central disc protrusion at C3-4 without stenosis.     Electronically Signed   By: Rise Mu M.D.   On: 03/10/2023 18:38  X-rays of her cervical spine from April 09, 2023 show no complications   IMPRESSION  Tammy Young is a 57 y.o. female who has some symptoms of persistent left-sided cervical radiculopathy.  She is getting an epidural steroid injection next week.  I would like to see her back 2 weeks after that injection to see whether it helped her pain.  She does have some moderate residual stenosis on the left side at C5-6.  She may be a candidate for posterior foraminotomy versus consideration of cervical spinal cord stimulator.  On a long-term basis, I think it is likely that she has fibromyalgia or some other chronic pain condition.  Other providers have mentioned consideration of pain referral.  I think this is a reasonable idea once we have worked through this issue.   Venetia Night, MD Neurosurgery

## 2023-04-19 ENCOUNTER — Encounter: Payer: Self-pay | Admitting: *Deleted

## 2023-04-19 ENCOUNTER — Ambulatory Visit
Admission: RE | Admit: 2023-04-19 | Discharge: 2023-04-19 | Disposition: A | Discharge status: Home / Self Care | Payer: Medicaid Other | Source: Ambulatory Visit | Attending: Physical Medicine & Rehabilitation | Admitting: Physical Medicine & Rehabilitation

## 2023-04-19 DIAGNOSIS — M5412 Radiculopathy, cervical region: Secondary | ICD-10-CM

## 2023-04-19 MED ORDER — IOPAMIDOL (ISOVUE-300) INJECTION 61%
1.0000 mL | Freq: Once | INTRAVENOUS | Status: AC | PRN
  Administered 2023-04-19: 1 mL

## 2023-04-19 MED ORDER — TRIAMCINOLONE ACETONIDE 40 MG/ML IJ SUSP (RADIOLOGY)
60.0000 mg | Freq: Once | INTRAMUSCULAR | Status: AC
  Administered 2023-04-19: 60 mg via EPIDURAL

## 2023-04-19 NOTE — Discharge Instructions (Signed)

## 2023-04-22 ENCOUNTER — Telehealth: Payer: Self-pay

## 2023-04-22 NOTE — Telephone Encounter (Signed)
Please advise her to hold off on latuda for now until the next visit in a few days, and to contact the office if she has any concerns in the meantime.

## 2023-04-22 NOTE — Telephone Encounter (Signed)
pt states that when she takes the latuda in the morning she can not sleep and she tried to take it at night and she is crying and shaking the next morning. does she need to change medication? pt was last seen on 8-20 next appt 10-8

## 2023-04-23 ENCOUNTER — Telehealth: Payer: Self-pay

## 2023-04-23 NOTE — Telephone Encounter (Signed)
pt returning call pt was given instructions per dr. Vanetta Shawl order.

## 2023-04-23 NOTE — Telephone Encounter (Signed)
left message with instructions per dr. Vanetta Shawl and also stated if she had any concerns to call our office back.

## 2023-04-24 ENCOUNTER — Other Ambulatory Visit: Payer: Self-pay | Admitting: Psychiatry

## 2023-04-24 NOTE — Telephone Encounter (Signed)
I left a voice message. Details will be in my chart message. She was advised to contact the clinic if any additional concerns.   I've spent a total time of 7 minutes providing service to this patient-generated inquiry in the MyChart message

## 2023-04-25 ENCOUNTER — Encounter: Payer: Self-pay | Admitting: Psychiatry

## 2023-04-25 ENCOUNTER — Ambulatory Visit (INDEPENDENT_AMBULATORY_CARE_PROVIDER_SITE_OTHER): Payer: Medicaid Other | Admitting: Psychiatry

## 2023-04-25 VITALS — BP 128/78 | HR 79 | Temp 98.1°F | Ht 64.5 in | Wt 172.0 lb

## 2023-04-25 DIAGNOSIS — G4700 Insomnia, unspecified: Secondary | ICD-10-CM | POA: Diagnosis not present

## 2023-04-25 DIAGNOSIS — F0630 Mood disorder due to known physiological condition, unspecified: Secondary | ICD-10-CM | POA: Diagnosis not present

## 2023-04-25 DIAGNOSIS — F4310 Post-traumatic stress disorder, unspecified: Secondary | ICD-10-CM | POA: Diagnosis not present

## 2023-04-25 DIAGNOSIS — F411 Generalized anxiety disorder: Secondary | ICD-10-CM

## 2023-04-25 MED ORDER — CARIPRAZINE HCL 1.5 MG PO CAPS: 1.5000 mg | ORAL_CAPSULE | Freq: Every day | ORAL | 1 refills | Status: DC

## 2023-04-25 MED ORDER — PRAZOSIN HCL 2 MG PO CAPS: 2.0000 mg | ORAL_CAPSULE | Freq: Every day | ORAL | 1 refills | Status: DC

## 2023-04-25 MED ORDER — HYDROXYZINE HCL 25 MG PO TABS: 25.0000 mg | ORAL_TABLET | Freq: Three times a day (TID) | ORAL | 1 refills | Status: AC | PRN

## 2023-04-25 MED ORDER — PRAZOSIN HCL 1 MG PO CAPS: 1.0000 mg | ORAL_CAPSULE | Freq: Every day | ORAL | 0 refills | Status: DC
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
## 2023-04-25 NOTE — Patient Instructions (Signed)
Start vraylar 1.5 mg daily   Start prazosin 1 mg at night for 3 days, then 2 mg at night  Continue hydroxyzine 25 mg three times a day as needed for anxiety  Next appointment: 11/26 at 11:30

## 2023-04-26 ENCOUNTER — Ambulatory Visit
Admission: RE | Admit: 2023-04-26 | Discharge: 2023-04-26 | Disposition: A | Discharge status: Home / Self Care | Payer: Medicaid Other | Attending: Gastroenterology | Admitting: Gastroenterology

## 2023-04-26 ENCOUNTER — Ambulatory Visit: Payer: Medicaid Other | Admitting: Certified Registered Nurse Anesthetist

## 2023-04-26 ENCOUNTER — Encounter
Admission: RE | Disposition: A | Discharge status: Home / Self Care | Payer: Self-pay | Source: Home / Self Care | Attending: Gastroenterology

## 2023-04-26 DIAGNOSIS — K621 Rectal polyp: Secondary | ICD-10-CM | POA: Diagnosis not present

## 2023-04-26 DIAGNOSIS — K5909 Other constipation: Secondary | ICD-10-CM | POA: Diagnosis not present

## 2023-04-26 DIAGNOSIS — K2289 Other specified disease of esophagus: Secondary | ICD-10-CM | POA: Insufficient documentation

## 2023-04-26 DIAGNOSIS — Z9049 Acquired absence of other specified parts of digestive tract: Secondary | ICD-10-CM | POA: Diagnosis not present

## 2023-04-26 DIAGNOSIS — K640 First degree hemorrhoids: Secondary | ICD-10-CM | POA: Diagnosis not present

## 2023-04-26 DIAGNOSIS — Z8673 Personal history of transient ischemic attack (TIA), and cerebral infarction without residual deficits: Secondary | ICD-10-CM | POA: Insufficient documentation

## 2023-04-26 DIAGNOSIS — G4733 Obstructive sleep apnea (adult) (pediatric): Secondary | ICD-10-CM | POA: Insufficient documentation

## 2023-04-26 DIAGNOSIS — Z8616 Personal history of COVID-19: Secondary | ICD-10-CM | POA: Diagnosis not present

## 2023-04-26 DIAGNOSIS — Z1211 Encounter for screening for malignant neoplasm of colon: Secondary | ICD-10-CM | POA: Insufficient documentation

## 2023-04-26 DIAGNOSIS — E559 Vitamin D deficiency, unspecified: Secondary | ICD-10-CM | POA: Diagnosis not present

## 2023-04-26 DIAGNOSIS — Z800 Family history of malignant neoplasm of digestive organs: Secondary | ICD-10-CM | POA: Diagnosis not present

## 2023-04-26 DIAGNOSIS — R1013 Epigastric pain: Secondary | ICD-10-CM | POA: Diagnosis present

## 2023-04-26 DIAGNOSIS — J449 Chronic obstructive pulmonary disease, unspecified: Secondary | ICD-10-CM | POA: Insufficient documentation

## 2023-04-26 DIAGNOSIS — Z86711 Personal history of pulmonary embolism: Secondary | ICD-10-CM | POA: Insufficient documentation

## 2023-04-26 DIAGNOSIS — I2724 Chronic thromboembolic pulmonary hypertension: Secondary | ICD-10-CM | POA: Diagnosis not present

## 2023-04-26 DIAGNOSIS — K760 Fatty (change of) liver, not elsewhere classified: Secondary | ICD-10-CM | POA: Insufficient documentation

## 2023-04-26 DIAGNOSIS — Z9884 Bariatric surgery status: Secondary | ICD-10-CM | POA: Insufficient documentation

## 2023-04-26 DIAGNOSIS — E538 Deficiency of other specified B group vitamins: Secondary | ICD-10-CM | POA: Insufficient documentation

## 2023-04-26 DIAGNOSIS — K219 Gastro-esophageal reflux disease without esophagitis: Secondary | ICD-10-CM | POA: Insufficient documentation

## 2023-04-26 HISTORY — PX: COLONOSCOPY WITH PROPOFOL: SHX5780

## 2023-04-26 HISTORY — PX: POLYPECTOMY: SHX5525

## 2023-04-26 HISTORY — PX: BIOPSY: SHX5522

## 2023-04-26 HISTORY — PX: ESOPHAGEAL BRUSHING: SHX6842

## 2023-04-26 HISTORY — PX: ESOPHAGOGASTRODUODENOSCOPY (EGD) WITH PROPOFOL: SHX5813

## 2023-04-26 LAB — KOH PREP

## 2023-04-26 SURGERY — COLONOSCOPY WITH PROPOFOL
Anesthesia: General

## 2023-04-26 MED ORDER — PROPOFOL 10 MG/ML IV BOLUS
INTRAVENOUS | Status: DC | PRN
  Administered 2023-04-26: 30 mg via INTRAVENOUS
  Administered 2023-04-26: 70 mg via INTRAVENOUS

## 2023-04-26 MED ORDER — PROPOFOL 1000 MG/100ML IV EMUL
INTRAVENOUS | Status: AC
  Filled 2023-04-26: qty 100

## 2023-04-26 MED ORDER — GLYCOPYRROLATE 0.2 MG/ML IJ SOLN
INTRAMUSCULAR | Status: AC
  Filled 2023-04-26: qty 1

## 2023-04-26 MED ORDER — MIDAZOLAM HCL 2 MG/2ML IJ SOLN
INTRAMUSCULAR | Status: AC
  Filled 2023-04-26: qty 2

## 2023-04-26 MED ORDER — SODIUM CHLORIDE 0.9 % IV SOLN
INTRAVENOUS | Status: DC
  Administered 2023-04-26: 20 mL/h via INTRAVENOUS

## 2023-04-26 MED ORDER — PROPOFOL 500 MG/50ML IV EMUL
INTRAVENOUS | Status: DC | PRN
  Administered 2023-04-26: 150 ug/kg/min via INTRAVENOUS

## 2023-04-26 MED ORDER — MIDAZOLAM HCL 2 MG/2ML IJ SOLN
INTRAMUSCULAR | Status: DC | PRN
  Administered 2023-04-26: 2 mg via INTRAVENOUS

## 2023-04-26 MED ORDER — GLYCOPYRROLATE 0.2 MG/ML IJ SOLN
INTRAMUSCULAR | Status: DC | PRN
  Administered 2023-04-26: .2 mg via INTRAVENOUS

## 2023-04-26 MED ORDER — LIDOCAINE HCL (CARDIAC) PF 100 MG/5ML IV SOSY
PREFILLED_SYRINGE | INTRAVENOUS | Status: DC | PRN
  Administered 2023-04-26: 50 mg via INTRAVENOUS

## 2023-04-26 NOTE — Op Note (Signed)
[facility] Patient Name: Tammy Young Procedure Date: 04/26/2023 9:06 AM MRN: [id number] Account #: [id number] Date of Birth: 03-Nov-1965 Admit Type: Outpatient Age: 57 Room: [facility] ENDO ROOM 3 Gender: Female Note Status: Finalized Instrument Name: Upper Endoscope [id number] Procedure:             Upper GI endoscopy Indications:           Epigastric abdominal pain Providers:             Eather Colas MD, MD Referring MD:          No Local Md, MD (Referring MD) Medicines:             Monitored Anesthesia Care Complications:         No immediate complications. Estimated blood loss:                         Minimal. Procedure:             Pre-Anesthesia Assessment:                        - Prior to the procedure, a History and Physical was                         performed, and patient medications and allergies were                         reviewed. The patient is competent. The risks and                         benefits of the procedure and the sedation options and                         risks were discussed with the patient. All questions                         were answered and informed consent was obtained.                         Patient identification and proposed procedure were                         verified by the physician, the nurse, the                         anesthesiologist, the anesthetist and the technician                         in the endoscopy suite. Mental Status Examination:                         alert and oriented. Airway Examination: normal                         oropharyngeal airway and neck mobility. Respiratory                         Examination: clear to auscultation. CV Examination:  normal. Prophylactic Antibiotics: The patient does not                         require prophylactic antibiotics. Prior                         Anticoagulants: The patient has taken no anticoagulant                          or antiplatelet agents. ASA Grade Assessment: III - A                         patient with severe systemic disease. After reviewing                         the risks and benefits, the patient was deemed in                         satisfactory condition to undergo the procedure. The                         anesthesia plan was to use monitored anesthesia care                         (MAC). Immediately prior to administration of                         medications, the patient was re-assessed for adequacy                         to receive sedatives. The heart rate, respiratory                         rate, oxygen saturations, blood pressure, adequacy of                         pulmonary ventilation, and response to care were                         monitored throughout the procedure. The physical                         status of the patient was re-assessed after the                         procedure.                        After obtaining informed consent, the endoscope was                         passed under direct vision. Throughout the procedure,                         the patient's blood pressure, pulse, and oxygen                         saturations were monitored continuously. The Endoscope  was introduced through the mouth, and advanced to the                         jejunum. The upper GI endoscopy was accomplished                         without difficulty. The patient tolerated the                         procedure well. Findings:      White nummular lesions were noted in the distal esophagus. Brushings for       KOH prep were obtained.      Evidence of a Roux-en-Y gastrojejunostomy was found. The gastrojejunal       anastomosis was characterized by healthy appearing mucosa. This was       traversed. The pouch-to-jejunum limb was characterized by healthy       appearing mucosa. The jejunojejunal anastomosis was characterized by       healthy  appearing mucosa.      Normal mucosa was found in the stomach. Biopsies were taken with a cold       forceps for Helicobacter pylori testing. Estimated blood loss was       minimal. Impression:            - White nummular lesions in esophageal mucosa.                         Brushings performed.                        - Roux-en-Y gastrojejunostomy with gastrojejunal                         anastomosis characterized by healthy appearing mucosa.                        - Normal mucosa was found in the stomach. Biopsied. Recommendation:        - Discharge patient to home.                        - Resume previous diet.                        - Continue present medications.                        - Await pathology results.                        - Return to referring physician as previously                         scheduled. Procedure Code(s):     --- Professional ---                        [phone], Esophagogastroduodenoscopy, flexible,                         transoral; with biopsy, single or multiple Diagnosis Code(s):     --- Professional ---  K22.89, Other specified disease of esophagus                        Z98.0, Intestinal bypass and anastomosis status                        R10.13, Epigastric pain CPT copyright 2022 American Medical Association. All rights reserved. The codes documented in this report are preliminary and upon coder review may  be revised to meet current compliance requirements. Eather Colas MD, MD 04/26/2023 10:01:47 AM Number of Addenda: 0 Note Initiated On: 04/26/2023 9:06 AM Estimated Blood Loss:  Estimated blood loss was minimal.      [facility]

## 2023-04-26 NOTE — Interval H&P Note (Signed)
History and Physical Interval Note:  04/26/2023 9:14 AM  Tammy Young  has presented today for surgery, with the diagnosis of ABDOMINAL PAIN,COLON CANCER SCREEN.  The various methods of treatment have been discussed with the patient and family. After consideration of risks, benefits and other options for treatment, the patient has consented to  Procedure(s) with comments: COLONOSCOPY WITH PROPOFOL (N/A) - NEEDS TO BE ABOUT 7:30 DUE TO TRANSPORTATION ESOPHAGOGASTRODUODENOSCOPY (EGD) WITH PROPOFOL (N/A) as a surgical intervention.  The patient's history has been reviewed, patient examined, no change in status, stable for surgery.  I have reviewed the patient's chart and labs.  Questions were answered to the patient's satisfaction.     Regis Bill  Ok to proceed with EGD/Colonoscopy

## 2023-04-26 NOTE — Transfer of Care (Signed)
Immediate Anesthesia Transfer of Care Note  Patient: Tammy Young  Procedure(s) Performed: COLONOSCOPY WITH PROPOFOL ESOPHAGOGASTRODUODENOSCOPY (EGD) WITH PROPOFOL ESOPHAGEAL BRUSHING BIOPSY POLYPECTOMY  Patient Location: Endoscopy Unit  Anesthesia Type:General  Level of Consciousness: awake and drowsy  Airway & Oxygen Therapy: Patient Spontanous Breathing  Post-op Assessment: Report given to RN and Post -op Vital signs reviewed and stable  Post vital signs: Reviewed and stable  Last Vitals:  Vitals Value Taken Time  BP 95/47 04/26/23 1001  Temp    Pulse 62 04/26/23 1002  Resp 23 04/26/23 1002  SpO2 100 % 04/26/23 1002  Vitals shown include unfiled device data.  Last Pain:  Vitals:   04/26/23 1001  TempSrc:   PainSc: 0-No pain         Complications: No notable events documented.

## 2023-04-26 NOTE — Op Note (Signed)
[facility] Patient Name: Tammy Young Procedure Date: 04/26/2023 9:05 AM MRN: [id number] Account #: [id number] Date of Birth: 29-Nov-1965 Admit Type: Outpatient Age: 57 Room: [facility] ENDO ROOM 3 Gender: Female Note Status: Finalized Instrument Name: Prentice Docker [id number] Procedure:             Colonoscopy Indications:           Screening for colorectal malignant neoplasm Providers:             Eather Colas MD, MD Referring MD:          No Local Md, MD (Referring MD) Medicines:             Monitored Anesthesia Care Complications:         No immediate complications. Estimated blood loss:                         Minimal. Procedure:             Pre-Anesthesia Assessment:                        - Prior to the procedure, a History and Physical was                         performed, and patient medications and allergies were                         reviewed. The patient is competent. The risks and                         benefits of the procedure and the sedation options and                         risks were discussed with the patient. All questions                         were answered and informed consent was obtained.                         Patient identification and proposed procedure were                         verified by the physician, the nurse, the                         anesthesiologist, the anesthetist and the technician                         in the endoscopy suite. Mental Status Examination:                         alert and oriented. Airway Examination: normal                         oropharyngeal airway and neck mobility. Respiratory                         Examination: clear to auscultation. CV Examination:  normal. Prophylactic Antibiotics: The patient does not                         require prophylactic antibiotics. Prior                         Anticoagulants: The patient has taken no anticoagulant                          or antiplatelet agents. ASA Grade Assessment: III - A                         patient with severe systemic disease. After reviewing                         the risks and benefits, the patient was deemed in                         satisfactory condition to undergo the procedure. The                         anesthesia plan was to use monitored anesthesia care                         (MAC). Immediately prior to administration of                         medications, the patient was re-assessed for adequacy                         to receive sedatives. The heart rate, respiratory                         rate, oxygen saturations, blood pressure, adequacy of                         pulmonary ventilation, and response to care were                         monitored throughout the procedure. The physical                         status of the patient was re-assessed after the                         procedure.                        After obtaining informed consent, the colonoscope was                         passed under direct vision. Throughout the procedure,                         the patient's blood pressure, pulse, and oxygen                         saturations were monitored continuously. The  Colonoscope was introduced through the anus and                         advanced to the the cecum, identified by appendiceal                         orifice and ileocecal valve. The colonoscopy was                         performed without difficulty. The patient tolerated                         the procedure well. The quality of the bowel                         preparation was adequate to identify polyps. The                         ileocecal valve, appendiceal orifice, and rectum were                         photographed. Findings:      The perianal and digital rectal examinations were normal.      Two sessile polyps were found in the rectum. The polyps were 2  to 3 mm       in size. These polyps were removed with a cold snare. Resection and       retrieval were complete. Estimated blood loss was minimal.      Internal hemorrhoids were found during retroflexion. The hemorrhoids       were Grade I (internal hemorrhoids that do not prolapse).      The exam was otherwise without abnormality on direct and retroflexion       views. Impression:            - Two 2 to 3 mm polyps in the rectum, removed with a                         cold snare. Resected and retrieved.                        - Internal hemorrhoids.                        - The examination was otherwise normal on direct and                         retroflexion views. Recommendation:        - Discharge patient to home.                        - Resume previous diet.                        - Continue present medications.                        - Await pathology results.                        - Repeat colonoscopy in 5 years for surveillance due  to requiring extensive washing for adequate prep.                        - Return to referring physician as previously                         scheduled. Procedure Code(s):     --- Professional ---                        [phone], Colonoscopy, flexible; with removal of                         tumor(s), polyp(s), or other lesion(s) by snare                         technique Diagnosis Code(s):     --- Professional ---                        Z12.11, Encounter for screening for malignant neoplasm                         of colon                        D12.8, Benign neoplasm of rectum                        K64.0, First degree hemorrhoids CPT copyright 2022 American Medical Association. All rights reserved. The codes documented in this report are preliminary and upon coder review may  be revised to meet current compliance requirements. Eather Colas MD, MD 04/26/2023 10:05:21 AM Number of Addenda: 0 Note Initiated On:  04/26/2023 9:05 AM Scope Withdrawal Time: 0 hours 10 minutes 0 seconds  Total Procedure Duration: 0 hours 12 minutes 36 seconds  Estimated Blood Loss:  Estimated blood loss was minimal.      [facility]

## 2023-04-26 NOTE — Brief Op Note (Addendum)
Esophageal brushing sent to lab for KOH, r/o candida.

## 2023-04-26 NOTE — Anesthesia Procedure Notes (Signed)
Date/Time: 04/26/2023 9:25 AM  Performed by: Ginger Carne, CRNAPre-anesthesia Checklist: Patient identified, Emergency Drugs available, Suction available, Patient being monitored and Timeout performed Patient Re-evaluated:Patient Re-evaluated prior to induction Oxygen Delivery Method: Nasal cannula Preoxygenation: Pre-oxygenation with 100% oxygen Induction Type: IV induction

## 2023-04-26 NOTE — H&P (Signed)
Outpatient short stay form Pre-procedure 04/26/2023  Regis Bill, MD  Primary Physician: Luciana Axe, NP  Reason for visit:  Epigastric pain/Screening colonoscopy  History of present illness:    57 y/o lady with history of obesity s/p roux-en-y, HLD, and constipation here for EGD/Colonoscopy for epigastric pain and screening colonoscopy. No blood thinners. Grandfather with colon cancer.    Current Facility-Administered Medications:    0.9 %  sodium chloride infusion, , Intravenous, Continuous, Teandra Harlan, Rossie Muskrat, MD, Last Rate: 20 mL/hr at 04/26/23 0830, 20 mL/hr at 04/26/23 0830  Facility-Administered Medications Ordered in Other Encounters:    gadopentetate dimeglumine (MAGNEVIST) injection 20 mL, 20 mL, Intravenous, Once PRN, Micki Riley, MD   sodium chloride flush (NS) 0.9 % injection 3 mL, 3 mL, Intravenous, Q12H, Fath, Darlin Priestly, MD  Medications Prior to Admission  Medication Sig Dispense Refill Last Dose   CALCIUM PO Take 1,500 mg by mouth daily.   04/25/2023   cariprazine (VRAYLAR) 1.5 MG capsule Take 1 capsule (1.5 mg total) by mouth daily. 30 capsule 1 04/25/2023   Cyanocobalamin (VITAMIN B-12 IJ) Inject 1,000 mcg as directed every 30 (thirty) days.   Past Week   ergocalciferol (VITAMIN D2) 1.25 MG (50000 UT) capsule Take 50,000 Units by mouth once a week.   04/25/2023   fluticasone-salmeterol (ADVAIR) 100-50 MCG/ACT AEPB Inhale 1 puff into the lungs 2 (two) times daily.   Past Month   [START ON 05/15/2023] hydrOXYzine (ATARAX) 25 MG tablet Take 1 tablet (25 mg total) by mouth 3 (three) times daily as needed for anxiety. 90 tablet 1 04/25/2023   methocarbamol (ROBAXIN) 500 MG tablet Take 500 mg by mouth once.   04/25/2023   Multiple Vitamins-Minerals (BARIATRIC MULTIVITAMINS/IRON PO) Take by mouth daily.   04/25/2023   pantoprazole (PROTONIX) 40 MG tablet Take 1 tablet (40 mg total) by mouth daily. 90 tablet 0 04/25/2023   prazosin (MINIPRESS) 1 MG capsule Take 1  capsule (1 mg total) by mouth at bedtime for 3 days. 3 capsule 0 04/25/2023   [START ON 04/28/2023] prazosin (MINIPRESS) 2 MG capsule Take 1 capsule (2 mg total) by mouth at bedtime. Start after completing 1 mg at night for 3 days 30 capsule 1 04/25/2023   propranolol (INDERAL) 10 MG tablet Take 10 mg by mouth 4 (four) times daily as needed.   04/25/2023   senna (SENOKOT) 8.6 MG TABS tablet Take 1 tablet (8.6 mg total) by mouth daily as needed. 30 tablet 0 04/25/2023   albuterol (PROVENTIL) (2.5 MG/3ML) 0.083% nebulizer solution Inhale 2.5 mg into the lungs every 4 (four) hours as needed for wheezing or shortness of breath.       valACYclovir (VALTREX) 500 MG tablet Take 500 mg by mouth 2 (two) times daily as needed (outbreak).  (Patient not taking: Reported on 04/25/2023)        Allergies  Allergen Reactions   Bee Venom Other (See Comments)    " Swelling "   Nsaids     History of gastric bypass surgery   Amoxicillin Rash   Augmentin [Amoxicillin-Pot Clavulanate] Itching and Rash     Past Medical History:  Diagnosis Date   Anxiety and depression    Arthritis    Chronic constipation    Chronic cough    Chronic kidney disease (CKD)    stage 3   Collagen vascular disease (HCC)    RA   COPD (chronic obstructive pulmonary disease) (HCC)    CTEPH (chronic thromboembolic pulmonary  hypertension) (HCC)    Fatty liver disease, nonalcoholic    GERD (gastroesophageal reflux disease)    Headache    Hemorrhoids    Herpes simplex vulvovaginitis    Hiatal hernia    History of kidney stones    History of stomach ulcers    HLD (hyperlipidemia)    Hypertension    Resolved after wt loss   Hypothyroidism, unspecified 10/23/2012   Liver disease    Lymphedema of right lower extremity    Morbid obesity (HCC)    Multiple gastric ulcers    Multiple gastric ulcers    Obstructive sleep apnea syndrome 10/18/2017   Personal history of COVID-19 12/2020   PONV (postoperative nausea and vomiting)     Pre-diabetes    Pulmonary embolism (HCC)    HEART CATH DONE 12/2020 AND MRI DONE - PULMONARY EMBOLIS - GONE   Renal disorder    kidney stones and shortened ureter repaired with surgery as a child.   Seizures (HCC)    only had with preeclampsia during pregnancy at age 48. none since   Stress incontinence (female) (female)    Stroke [facility])    Identified on CT.  No deficits   Vitamin B 12 deficiency    Vitamin D deficiency     Review of systems:  Otherwise negative.    Physical Exam  Gen: Alert, oriented. Appears stated age.  HEENT: PERRLA. Lungs: No respiratory distress CV: RRR Abd: soft, benign, no masses Ext: No edema    Planned procedures: Proceed with EGD/colonoscopy. The patient understands the nature of the planned procedure, indications, risks, alternatives and potential complications including but not limited to bleeding, infection, perforation, damage to internal organs and possible oversedation/side effects from anesthesia. The patient agrees and gives consent to proceed.  Please refer to procedure notes for findings, recommendations and patient disposition/instructions.     Regis Bill, MD [facility]astroenter[facility]

## 2023-04-26 NOTE — Anesthesia Postprocedure Evaluation (Signed)
Anesthesia Post Note  Patient: MAISYN NOURI  Procedure(s) Performed: COLONOSCOPY WITH PROPOFOL ESOPHAGOGASTRODUODENOSCOPY (EGD) WITH PROPOFOL ESOPHAGEAL BRUSHING BIOPSY POLYPECTOMY  Patient location during evaluation: PACU Anesthesia Type: General Level of consciousness: awake and awake and alert Pain management: satisfactory to patient Vital Signs Assessment: post-procedure vital signs reviewed and stable Respiratory status: spontaneous breathing and nonlabored ventilation Cardiovascular status: blood pressure returned to baseline Anesthetic complications: no   No notable events documented.   Last Vitals:  Vitals:   04/26/23 0815 04/26/23 1001  BP: (!) 140/83 (!) 95/47  Pulse: 70 61  Resp: 20 20  Temp: (!) 35.9 C   SpO2: 100% 99%    Last Pain:  Vitals:   04/26/23 1001  TempSrc:   PainSc: 0-No pain                 VAN STAVEREN,Janmarie Smoot

## 2023-04-26 NOTE — Anesthesia Preprocedure Evaluation (Addendum)
Anesthesia Evaluation  Patient identified by MRN, date of birth, ID band Patient awake    Reviewed: Allergy & Precautions, NPO status , Patient's Chart, lab work & pertinent test results  Airway Mallampati: II  TM Distance: >3 FB Neck ROM: Limited    Dental  (+) Teeth Intact   Pulmonary neg pulmonary ROS, sleep apnea    Pulmonary exam normal  + decreased breath sounds      Cardiovascular Exercise Tolerance: Good hypertension, Pt. on medications negative cardio ROS Normal cardiovascular exam Rhythm:Regular Rate:Normal     Neuro/Psych Seizures -,    Depression    CVA negative neurological ROS  negative psych ROS   GI/Hepatic negative GI ROS, Neg liver ROS, PUD,GERD  Poorly Controlled,,  Endo/Other  negative endocrine ROSHypothyroidism    Renal/GU negative Renal ROS  negative genitourinary   Musculoskeletal   Abdominal  (+) + obese  Peds negative pediatric ROS (+)  Hematology negative hematology ROS (+)   Anesthesia Other Findings Past Medical History: No date: Anxiety and depression No date: Arthritis No date: Chronic constipation No date: Chronic cough No date: Chronic kidney disease (CKD)     Comment:  stage 3 No date: Collagen vascular disease (HCC)     Comment:  RA No date: COPD (chronic obstructive pulmonary disease) (HCC) No date: CTEPH (chronic thromboembolic pulmonary hypertension) (HCC) No date: Fatty liver disease, nonalcoholic No date: GERD (gastroesophageal reflux disease) No date: Headache No date: Hemorrhoids No date: Herpes simplex vulvovaginitis No date: Hiatal hernia No date: History of kidney stones No date: History of stomach ulcers No date: HLD (hyperlipidemia) No date: Hypertension     Comment:  Resolved after wt loss 10/23/2012: Hypothyroidism, unspecified No date: Liver disease No date: Lymphedema of right lower extremity No date: Morbid obesity (HCC) No date: Multiple gastric  ulcers No date: Multiple gastric ulcers 10/18/2017: Obstructive sleep apnea syndrome 12/2020: Personal history of COVID-19 No date: PONV (postoperative nausea and vomiting) No date: Pre-diabetes No date: Pulmonary embolism (HCC)     Comment:  HEART CATH DONE 12/2020 AND MRI DONE - PULMONARY EMBOLIS               - GONE No date: Renal disorder     Comment:  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 No date: Seizures (HCC)     Comment:  only had with preeclampsia during pregnancy at age 64.               none since No date: Stress incontinence (female) (female) No date: Stroke [facility])     Comment:  Identified on CT.  No deficits No date: Vitamin B 12 deficiency No date: Vitamin D deficiency  Past Surgical History: 11/21/2022: ANTERIOR CERVICAL DECOMP/DISCECTOMY FUSION; N/A     Comment:  Procedure: C5-7 ANTERIOR CERVICAL DISCECTOMY AND FUSION;              Surgeon: Venetia Night, MD;  Location: [facility];                Service: Neurosurgery;  Laterality: N/A; No date: BREAST SURGERY     Comment:  reduction No date: CARPAL TUNNEL RELEASE; Bilateral No date: CHOLECYSTECTOMY 10/09/2016: CYSTOSCOPY WITH URETEROSCOPY, STONE BASKETRY AND STENT  PLACEMENT     Comment:  Procedure: CYSTOSCOPY WITH URETEROSCOPY, STONE BASKETRY               AND STENT PLACEMENT;  Surgeon: Vanna Scotland, MD;                Location: [facility];  Service: Urology;; 04/15/2015: DIRECT LARYNGOSCOPY; Left     Comment:  Procedure: DIRECT LARYNGOSCOPY BIOPSY AND LASER LEFT               TONGUE LESION;  Surgeon: Suzanna Obey, MD;  Location: [facility];  Service: ENT;  Laterality: Left; 09/03/2019: ESOPHAGOGASTRODUODENOSCOPY (EGD) WITH PROPOFOL; N/A     Comment:  Procedure: ESOPHAGOGASTRODUODENOSCOPY (EGD) WITH               PROPOFOL;  Surgeon: Toledo, Boykin Nearing, MD;  Location:               [facility];  Service: Gastroenterology;  Laterality:                N/A; 02/03/2021: ESOPHAGOGASTRODUODENOSCOPY (EGD) WITH PROPOFOL; N/A     Comment:  Procedure: ESOPHAGOGASTRODUODENOSCOPY (EGD) WITH               PROPOFOL;  Surgeon: Regis Bill, MD;  Location:               [facility];  Service: Endoscopy;  Laterality: N/A;                ELIQUIS 04/04/2021: GASTRIC ROUX-EN-Y; N/A     Comment:  Procedure: LAPAROSCOPIC ROUX-EN-Y GASTRIC BYPASS WITH               UPPER ENDOSCOPY;  Surgeon: Berna Bue, MD;                Location: [facility];  Service: General;  Laterality: N/A; No date: LEFT HEART CATH No date: REDUCTION MAMMAPLASTY 06/02/2020: RIGHT HEART CATH; N/A     Comment:  Procedure: RIGHT HEART CATH;  Surgeon: Alwyn Pea, MD;  Location: [facility];  Service:               Cardiovascular;  Laterality: N/A; 01/01/2022: SHOULDER ARTHROSCOPY; Right     Comment:  Procedure: Right shoulder arthroscopic rotator cuff               repair, subscapularis repaire, subpectoral biceps               tenodesis, distal clavical excision;  Surgeon: Signa Kell, MD;  Location: [facility];  Service:               Orthopedics;  Laterality: Right; No date: TONSILLECTOMY 04/04/2021: UPPER GI ENDOSCOPY; N/A     Comment:  Procedure: UPPER GI ENDOSCOPY;  Surgeon: Berna Bue, MD;  Location: [facility];  Service: General;  Laterality:              N/A; No date: URETER SURGERY 2000: VAGINAL HYSTERECTOMY  BMI    Body Mass Index: 29.39 kg/m      Reproductive/Obstetrics negative OB ROS                             Anesthesia Physical Anesthesia Plan  ASA: 3  Anesthesia Plan: General   Post-op Pain Management:  Induction:   PONV Risk Score and Plan: Propofol infusion and TIVA  Airway Management Planned: Natural Airway and Nasal Cannula  Additional Equipment:   Intra-op Plan:   Post-operative Plan:   Informed Consent: I have reviewed  the patients History and Physical, chart, labs and discussed the procedure including the risks, benefits and alternatives for the proposed anesthesia with the patient or authorized representative who has indicated his/her understanding and acceptance.     Dental Advisory Given  Plan Discussed with: CRNA and Surgeon  Anesthesia Plan Comments:        Anesthesia Quick Evaluation

## 2023-04-29 ENCOUNTER — Encounter: Payer: Self-pay | Admitting: Gastroenterology

## 2023-04-29 LAB — SURGICAL PATHOLOGY

## 2023-04-30 ENCOUNTER — Ambulatory Visit: Payer: Medicaid Other | Admitting: Psychiatry

## 2023-05-08 ENCOUNTER — Other Ambulatory Visit: Payer: Self-pay | Admitting: Psychiatry

## 2023-05-11 ENCOUNTER — Encounter: Payer: Self-pay | Admitting: Neurosurgery

## 2023-05-13 ENCOUNTER — Telehealth: Payer: Self-pay

## 2023-05-13 NOTE — Telephone Encounter (Signed)
Unfortunately, other options are limited. Please advise her to restart hydroxyzine after she completes the fluconazole. Thanks.

## 2023-05-13 NOTE — Telephone Encounter (Signed)
pt called states that she had a endo done and they found she has yeast in her esophagus and that they put her on fluconazole and that she can not take the hydroxyzine because it is a interaction between the two. can you send in something else that she can take.  Pt was last seen on 10-3 next appt 11-26

## 2023-05-14 NOTE — Telephone Encounter (Signed)
finally got the patient she was given the information. she understood directions
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
## 2023-05-16 ENCOUNTER — Encounter: Payer: Self-pay | Admitting: Neurosurgery

## 2023-05-16 ENCOUNTER — Ambulatory Visit: Payer: Medicaid Other | Admitting: Neurosurgery

## 2023-05-16 VITALS — BP 134/80 | Ht 64.0 in | Wt 171.0 lb

## 2023-05-16 DIAGNOSIS — G8929 Other chronic pain: Secondary | ICD-10-CM

## 2023-05-16 DIAGNOSIS — M25511 Pain in right shoulder: Secondary | ICD-10-CM | POA: Diagnosis not present

## 2023-05-16 DIAGNOSIS — M545 Low back pain, unspecified: Secondary | ICD-10-CM

## 2023-05-16 DIAGNOSIS — M25512 Pain in left shoulder: Secondary | ICD-10-CM

## 2023-05-16 DIAGNOSIS — M5412 Radiculopathy, cervical region: Secondary | ICD-10-CM

## 2023-05-16 MED ORDER — BACLOFEN 10 MG PO TABS: 10.0000 mg | ORAL_TABLET | Freq: Three times a day (TID) | ORAL | 1 refills | Status: DC | PRN

## 2023-05-16 NOTE — Progress Notes (Signed)
   Neurosurgery Note    Primary Care Provider Luciana Axe, NP [address] [place] [address] T: [phone] F: [phone]   History of Present Illness: Ms. Toyama is a 57 year old with a history of C5-7 ACDF on 11/21/2022.  Unfortunately she has had continued neck and arm pain.  She had an duction approximately 3 weeks ago which helped a small amount of her pain.  Today she complains of significant pain with movement of her left arm.  There is pain around her left shoulder.  She is concerned that she may have shoulder mediated pathology.  She is also having low back pain without significant sciatic component.      General Review of Systems:  A ROS was performed including pertinent positive and negatives as documented.  All other systems are negative.   DATA REVIEWED    Imaging Studies  MRI C spine 03/08/23   FINDINGS: Alignment: Exaggeration of the normal cervical lordosis. Trace retrolisthesis of C4 on C5.   Vertebrae: Prior ACDF at C5-C7. Vertebral body height maintained without acute or chronic fracture. Bone marrow signal intensity within normal limits. No discrete or worrisome osseous lesions. No abnormal marrow edema.   Cord: Normal signal and morphology.   Posterior Fossa, vertebral arteries, paraspinal tissues: Unremarkable.   Disc levels:   C2-C3: Unremarkable.   C3-C4: Small central disc protrusion mildly indents the ventral thecal sac. No spinal stenosis. Foramina remain patent.   C4-C5: Mild disc bulge with uncovertebral hypertrophy. Flattening of the ventral thecal sac without significant spinal stenosis. Mild right greater than left C5 foraminal narrowing.   C5-C6: Prior fusion. No residual spinal stenosis. Uncovertebral spurring with residual mild to moderate left greater than right C6 foraminal narrowing.   C6-C7: Prior fusion. No residual spinal stenosis. Foramina appear patent.   C7-T1:  Normal interspace.  No canal or  foraminal stenosis.   IMPRESSION: 1. Prior ACDF at C5-C7 without residual spinal stenosis. Uncovertebral spurring at C5-6 with residual mild to moderate left greater than right C6 foraminal narrowing. 2. Mild disc bulge with uncovertebral hypertrophy at C4-5 with resultant mild right greater than left C5 foraminal stenosis. 3. Small central disc protrusion at C3-4 without stenosis.     Electronically Signed   By: Rise Mu M.D.   On: 03/10/2023 18:38  X-rays of her cervical spine from April 09, 2023 show no complications   IMPRESSION  Ms. Slowik is a 57 y.o. female who has some symptoms of persistent left-sided cervical radiculopathy, though I think her more pressing issue is left shoulder related.  I would like to have her reevaluated by her orthopedic surgeon.  She may be suffering from some rotator cuff issues on that side.  She is also having persistent right shoulder pain.  I have recommended that she start a muscle relaxant to help with her pain.  I will also send her for physical therapy for her low back and shoulders.  I will see her back in 3 months with x-rays.    I spent a total of 15 minutes in this patient's care today. This time was spent reviewing pertinent records including imaging studies, obtaining and confirming history, performing a directed evaluation, formulating and discussing my recommendations, and documenting the visit within the medical record.   Venetia Night, MD Neurosurgery

## 2023-05-18 ENCOUNTER — Other Ambulatory Visit: Payer: Self-pay | Admitting: Psychiatry

## 2023-05-20 ENCOUNTER — Ambulatory Visit (INDEPENDENT_AMBULATORY_CARE_PROVIDER_SITE_OTHER): Payer: Medicaid Other | Admitting: Psychiatry

## 2023-05-20 ENCOUNTER — Telehealth: Payer: Self-pay | Admitting: Neurosurgery

## 2023-05-20 ENCOUNTER — Encounter: Payer: Self-pay | Admitting: Psychiatry

## 2023-05-20 VITALS — BP 135/87 | HR 73 | Temp 96.9°F | Ht 64.0 in | Wt 176.8 lb

## 2023-05-20 DIAGNOSIS — G4700 Insomnia, unspecified: Secondary | ICD-10-CM | POA: Diagnosis not present

## 2023-05-20 DIAGNOSIS — F0630 Mood disorder due to known physiological condition, unspecified: Secondary | ICD-10-CM

## 2023-05-20 DIAGNOSIS — F4310 Post-traumatic stress disorder, unspecified: Secondary | ICD-10-CM | POA: Diagnosis not present

## 2023-05-20 MED ORDER — PRAZOSIN HCL 2 MG PO CAPS: 4.0000 mg | ORAL_CAPSULE | Freq: Every day | ORAL | 1 refills | Status: DC

## 2023-05-20 MED ORDER — CARIPRAZINE HCL 1.5 MG PO CAPS: 1.5000 mg | ORAL_CAPSULE | Freq: Every day | ORAL | 1 refills | Status: DC

## 2023-05-20 MED ORDER — FLUOXETINE HCL 20 MG PO CAPS: 20.0000 mg | ORAL_CAPSULE | Freq: Every day | ORAL | 1 refills | Status: DC

## 2023-05-20 NOTE — Telephone Encounter (Signed)
Patient has called stating she was last seen on 10.24.24 and was prescribed baclofen (LIORESAL) 10 MG tablet   States she is taking this medication 3 times a day as stated on the instructions and that this is not working for her. Wanting something stronger for her pain, please advise.

## 2023-05-20 NOTE — Patient Instructions (Signed)
Continue Vraylar 1.5 mg daily  Increase prazosin 4 mg at night  Start fluoxetine 20 mg daily after you complete fluconazole  Continue hydroxyzine 25 mg three times a day as needed for anxiety fter you complete fluconazole  Next appointment: 12/31 at 8:30

## 2023-05-21 ENCOUNTER — Ambulatory Visit: Payer: Medicaid Other | Admitting: Neurosurgery

## 2023-05-27 ENCOUNTER — Telehealth: Payer: Self-pay

## 2023-05-27 NOTE — Telephone Encounter (Signed)
pt left a message that the hydroxyzine is not working. she was last seen on 10-28 next appt 11-26

## 2023-05-27 NOTE — Telephone Encounter (Signed)
pt was notified of dr. Vanetta Shawl directions.

## 2023-05-27 NOTE — Telephone Encounter (Signed)
Please advise her that we have just adjusted her medication and that it takes some time to become fully effective. Please advise her to stay on the current regimen until her next visit, unless she experiences any side effects.

## 2023-06-04 ENCOUNTER — Other Ambulatory Visit: Payer: Self-pay | Admitting: Gastroenterology

## 2023-06-04 DIAGNOSIS — R1013 Epigastric pain: Secondary | ICD-10-CM

## 2023-06-04 DIAGNOSIS — R1011 Right upper quadrant pain: Secondary | ICD-10-CM

## 2023-06-06 ENCOUNTER — Telehealth: Payer: Self-pay

## 2023-06-06 NOTE — Telephone Encounter (Signed)
She now has sooner appointment next week. Will plan to address this at her next visit.

## 2023-06-06 NOTE — Telephone Encounter (Signed)
pt called states that she needs you to put her on something else besides the hydroxyzine. she said that she needs something to help with her anxiety, nervousness

## 2023-06-07 ENCOUNTER — Other Ambulatory Visit: Payer: Self-pay | Admitting: Neurosurgery

## 2023-06-07 NOTE — Telephone Encounter (Signed)
Pt.notified
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
## 2023-06-07 NOTE — Telephone Encounter (Signed)
Patient sent Mychart message on 10/28 that baclofen was not helping. No relief with robaxin, flexeril, or zanaflex.   Advised her to stop baclofen as it was not helping and f/u with ortho as discussed with Dr. Myer Haff.   Baclofen refill denied.

## 2023-06-11 ENCOUNTER — Ambulatory Visit (INDEPENDENT_AMBULATORY_CARE_PROVIDER_SITE_OTHER): Payer: Medicaid Other | Admitting: Psychiatry

## 2023-06-11 ENCOUNTER — Telehealth: Payer: Self-pay

## 2023-06-11 ENCOUNTER — Encounter: Payer: Self-pay | Admitting: Psychiatry

## 2023-06-11 VITALS — BP 144/83 | HR 71 | Temp 97.7°F | Ht 64.0 in | Wt 173.6 lb

## 2023-06-11 DIAGNOSIS — F3181 Bipolar II disorder: Secondary | ICD-10-CM

## 2023-06-11 DIAGNOSIS — F4310 Post-traumatic stress disorder, unspecified: Secondary | ICD-10-CM

## 2023-06-11 DIAGNOSIS — Z79899 Other long term (current) drug therapy: Secondary | ICD-10-CM

## 2023-06-11 MED ORDER — VALPROIC ACID 250 MG PO CAPS: 250.0000 mg | ORAL_CAPSULE | Freq: Two times a day (BID) | ORAL | 1 refills | Status: DC

## 2023-06-11 NOTE — Telephone Encounter (Signed)
called pharmacy they just got it finished and it is $4.00

## 2023-06-11 NOTE — Telephone Encounter (Signed)
notified pt that rx was ready for pick up and is $4.00

## 2023-06-11 NOTE — Patient Instructions (Addendum)
Discontinue Vraylar Start Depakote 250 mg twice a day  Obtain labs (CBC, LFT, VPA) five days after starting Depakote Continue prazosin 4 mg at night  Continue fluoxetine 20 mg daily  Discontinue hydroxyzine Next appointment: 12/31 at 8:30

## 2023-06-11 NOTE — Telephone Encounter (Signed)
It was sent earlier. Please verify with the pharmacy- valproic acid is the generic name, fyi

## 2023-06-11 NOTE — Telephone Encounter (Signed)
pt states that she went to the pharmacy and they did not received the depakote rx . please send rx

## 2023-06-13 ENCOUNTER — Other Ambulatory Visit: Payer: Self-pay | Admitting: Psychiatry

## 2023-06-18 ENCOUNTER — Ambulatory Visit: Payer: Medicaid Other | Admitting: Psychiatry

## 2023-06-24 ENCOUNTER — Ambulatory Visit: Payer: Medicaid Other | Admitting: Dietician

## 2023-06-27 ENCOUNTER — Encounter (INDEPENDENT_AMBULATORY_CARE_PROVIDER_SITE_OTHER): Payer: Medicaid Other

## 2023-06-27 DIAGNOSIS — F3181 Bipolar II disorder: Secondary | ICD-10-CM | POA: Diagnosis not present

## 2023-06-28 ENCOUNTER — Encounter: Admission: RE | Admit: 2023-06-28 | Payer: Medicaid Other | Source: Ambulatory Visit

## 2023-06-28 MED ORDER — RISPERIDONE 0.5 MG PO TABS: 0.5000 mg | ORAL_TABLET | Freq: Every day | ORAL | 0 refills | Status: DC

## 2023-06-28 NOTE — Telephone Encounter (Signed)
Called the patient in response to her message.   She reported experiencing headaches from Depakote and described feeling as though she had been drinking energy drinks, despite not consuming any. She also felt shaky during the visit. After discussion, it was agreed to start Risperdal, and she was informed of the potential side effects, including EPS and metabolic effects. Monitoring for QTc prolongation was also discussed.  Although she expressed concern about shakiness, no significant tremors were observed during prior evaluations to suggest EPS or Parkinsonism. She was reassured and agreed with the outlined plan.  - start risperidone 0.5 mg at night.  It was noted that she restarted hydroxyzine, despite reporting limited benefit from it. She was advised to hold off on this medication but may continue taking it if she has a strong preference.  I've spent a total time of 16 minutes providing service to this patient-generated inquiry in the MyChart message

## 2023-07-10 ENCOUNTER — Encounter: Payer: Self-pay | Admitting: Gastroenterology
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
## 2023-07-20 ENCOUNTER — Other Ambulatory Visit: Payer: Self-pay | Admitting: Psychiatry

## 2023-07-22 ENCOUNTER — Other Ambulatory Visit: Payer: Self-pay | Admitting: Psychiatry

## 2023-07-23 ENCOUNTER — Encounter: Payer: Self-pay | Admitting: Psychiatry

## 2023-07-23 ENCOUNTER — Ambulatory Visit (INDEPENDENT_AMBULATORY_CARE_PROVIDER_SITE_OTHER): Payer: Medicaid Other | Admitting: Psychiatry

## 2023-07-23 VITALS — BP 118/78 | HR 93 | Temp 98.5°F | Ht 64.0 in | Wt 181.4 lb

## 2023-07-23 DIAGNOSIS — Z79899 Other long term (current) drug therapy: Secondary | ICD-10-CM | POA: Diagnosis not present

## 2023-07-23 DIAGNOSIS — F4310 Post-traumatic stress disorder, unspecified: Secondary | ICD-10-CM | POA: Diagnosis not present

## 2023-07-23 DIAGNOSIS — F3181 Bipolar II disorder: Secondary | ICD-10-CM | POA: Diagnosis not present

## 2023-07-23 MED ORDER — PRAZOSIN HCL 2 MG PO CAPS: 4.0000 mg | ORAL_CAPSULE | Freq: Every day | ORAL | 1 refills | Status: DC

## 2023-07-23 MED ORDER — HYDROXYZINE HCL 25 MG PO TABS: 25.0000 mg | ORAL_TABLET | Freq: Two times a day (BID) | ORAL | 1 refills | Status: AC | PRN

## 2023-07-23 MED ORDER — FLUOXETINE HCL 20 MG PO CAPS: 20.0000 mg | ORAL_CAPSULE | Freq: Every day | ORAL | 1 refills | Status: DC

## 2023-07-23 MED ORDER — RISPERIDONE 1 MG PO TABS: 1.0000 mg | ORAL_TABLET | Freq: Every day | ORAL | 1 refills | Status: DC

## 2023-08-09 ENCOUNTER — Encounter: Admission: RE | Admit: 2023-08-09 | Payer: Medicaid Other | Source: Ambulatory Visit

## 2023-08-15 ENCOUNTER — Ambulatory Visit: Payer: Medicaid Other | Admitting: Neurosurgery

## 2023-08-16 ENCOUNTER — Encounter
Admission: RE | Admit: 2023-08-16 | Discharge: 2023-08-16 | Disposition: A | Discharge status: Home / Self Care | Payer: Medicaid Other | Source: Ambulatory Visit | Attending: Gastroenterology | Admitting: Gastroenterology

## 2023-08-16 ENCOUNTER — Other Ambulatory Visit: Payer: Self-pay | Admitting: Psychiatry

## 2023-08-16 DIAGNOSIS — R1013 Epigastric pain: Secondary | ICD-10-CM | POA: Diagnosis present

## 2023-08-16 MED ORDER — TECHNETIUM TC 99M SULFUR COLLOID
2.1700 | Freq: Once | INTRAVENOUS | Status: AC | PRN
  Administered 2023-08-16: 2.17 via ORAL

## 2023-08-22 ENCOUNTER — Other Ambulatory Visit: Payer: Self-pay | Admitting: Orthopedic Surgery

## 2023-08-22 DIAGNOSIS — M25511 Pain in right shoulder: Secondary | ICD-10-CM

## 2023-08-23 ENCOUNTER — Other Ambulatory Visit: Payer: Medicaid Other

## 2023-08-27 ENCOUNTER — Other Ambulatory Visit: Payer: Self-pay | Admitting: Orthopedic Surgery

## 2023-08-27 ENCOUNTER — Encounter: Payer: Self-pay | Admitting: Student in an Organized Health Care Education/Training Program

## 2023-08-27 ENCOUNTER — Ambulatory Visit
Payer: Medicaid Other | Attending: Student in an Organized Health Care Education/Training Program | Admitting: Student in an Organized Health Care Education/Training Program

## 2023-08-27 VITALS — BP 103/77 | HR 79 | Temp 97.4°F | Resp 16 | Ht 64.0 in | Wt 182.0 lb

## 2023-08-27 DIAGNOSIS — G5682 Other specified mononeuropathies of left upper limb: Secondary | ICD-10-CM | POA: Diagnosis present

## 2023-08-27 DIAGNOSIS — Z9889 Other specified postprocedural states: Secondary | ICD-10-CM | POA: Insufficient documentation

## 2023-08-27 DIAGNOSIS — M25511 Pain in right shoulder: Secondary | ICD-10-CM | POA: Insufficient documentation

## 2023-08-27 DIAGNOSIS — M797 Fibromyalgia: Secondary | ICD-10-CM | POA: Insufficient documentation

## 2023-08-27 DIAGNOSIS — G5681 Other specified mononeuropathies of right upper limb: Secondary | ICD-10-CM | POA: Diagnosis not present

## 2023-08-27 DIAGNOSIS — M12811 Other specific arthropathies, not elsewhere classified, right shoulder: Secondary | ICD-10-CM | POA: Diagnosis present

## 2023-08-27 DIAGNOSIS — Z981 Arthrodesis status: Secondary | ICD-10-CM | POA: Diagnosis present

## 2023-08-27 DIAGNOSIS — G8929 Other chronic pain: Secondary | ICD-10-CM

## 2023-08-27 DIAGNOSIS — M25512 Pain in left shoulder: Secondary | ICD-10-CM | POA: Insufficient documentation

## 2023-08-27 NOTE — Progress Notes (Signed)
 Safety precautions to be maintained throughout the outpatient stay will include: orient to surroundings, keep bed in low position, maintain call bell within reach at all times, provide assistance with transfer out of bed and ambulation.

## 2023-08-27 NOTE — Progress Notes (Signed)
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 Vitamin B 12 deficiency, and Vitamin D  deficiency. Family: family history includes Alcohol abuse in her father; Bipolar disorder in her mother; COPD in her mother; Diabetes in her maternal grandmother and mother; Drug abuse in her mother; Kidney Stones in her father and mother; Migraines in her maternal grandmother; Prostate cancer in her maternal grandfather; Stroke in her maternal grandmother.  Past Surgical History:  Procedure Laterality Date   ANTERIOR CERVICAL DECOMP/DISCECTOMY FUSION N/A 11/21/2022   Procedure: C5-7 ANTERIOR CERVICAL DISCECTOMY AND FUSION;  Surgeon: Clois Fret, MD;  Location: [facility];  Service: Neurosurgery;  Laterality: N/A;   BIOPSY  04/26/2023   Procedure: BIOPSY;  Surgeon: Maryruth Ole DASEN, MD;  Location: [facility];  Service: Endoscopy;;   BREAST SURGERY     reduction   CARPAL TUNNEL RELEASE Bilateral    CHOLECYSTECTOMY     COLONOSCOPY WITH PROPOFOL  N/A 04/26/2023   Procedure: COLONOSCOPY WITH PROPOFOL ;  Surgeon: Maryruth Ole DASEN, MD;  Location: [facility];  Service: Endoscopy;  Laterality: N/A;  NEEDS TO BE ABOUT 7:30 DUE TO TRANSPORTATION   CYSTOSCOPY WITH URETEROSCOPY, STONE BASKETRY AND STENT PLACEMENT  10/09/2016   Procedure: CYSTOSCOPY WITH URETEROSCOPY, STONE BASKETRY AND STENT PLACEMENT;  Surgeon: Rosina Riis, MD;  Location: [facility];  Service: Urology;;   DIRECT LARYNGOSCOPY Left 04/15/2015   Procedure: DIRECT LARYNGOSCOPY BIOPSY AND LASER LEFT TONGUE LESION;  Surgeon: Norleen Notice, MD;  Location: [facility];  Service: ENT;  Laterality: Left;   ESOPHAGEAL BRUSHING  04/26/2023   Procedure: ESOPHAGEAL BRUSHING;  Surgeon: Maryruth Ole DASEN, MD;  Location: [facility];  Service: Endoscopy;;   ESOPHAGOGASTRODUODENOSCOPY (EGD) WITH PROPOFOL  N/A 09/03/2019   Procedure: ESOPHAGOGASTRODUODENOSCOPY (EGD) WITH PROPOFOL ;  Surgeon: Toledo, Ladell POUR, MD;  Location: [facility];  Service: Gastroenterology;  Laterality: N/A;   ESOPHAGOGASTRODUODENOSCOPY (EGD) WITH PROPOFOL  N/A 02/03/2021   Procedure: ESOPHAGOGASTRODUODENOSCOPY (EGD) WITH PROPOFOL ;  Surgeon: Maryruth Ole DASEN, MD;  Location: [facility];  Service: Endoscopy;  Laterality: N/A;  ELIQUIS    ESOPHAGOGASTRODUODENOSCOPY (EGD) WITH PROPOFOL  N/A 04/26/2023   Procedure: ESOPHAGOGASTRODUODENOSCOPY (EGD) WITH PROPOFOL ;  Surgeon: Maryruth Ole DASEN, MD;  Location: [facility];  Service: Endoscopy;  Laterality: N/A;   GASTRIC ROUX-EN-Y N/A 04/04/2021   Procedure: LAPAROSCOPIC ROUX-EN-Y GASTRIC BYPASS  WITH UPPER ENDOSCOPY;  Surgeon: Signe Mitzie LABOR, MD;  Location: [facility];  Service: General;  Laterality: N/A;   LEFT HEART CATH     POLYPECTOMY  04/26/2023   Procedure: POLYPECTOMY;  Surgeon: Maryruth Ole DASEN, MD;   Location: [facility];  Service: Endoscopy;;   REDUCTION MAMMAPLASTY     RIGHT HEART CATH N/A 06/02/2020   Procedure: RIGHT HEART CATH;  Surgeon: Florencio Cara BIRCH, MD;  Location: [facility];  Service: Cardiovascular;  Laterality: N/A;   SHOULDER ARTHROSCOPY Right 01/01/2022   Procedure: Right shoulder arthroscopic rotator cuff repair, subscapularis repaire, subpectoral biceps tenodesis, distal clavical excision;  Surgeon: Tobie Priest, MD;  Location: [facility];  Service: Orthopedics;  Laterality: Right;   TONSILLECTOMY     UPPER GI ENDOSCOPY N/A 04/04/2021   Procedure: UPPER GI ENDOSCOPY;  Surgeon: Signe Mitzie LABOR, MD;  Location: [facility];  Service: General;  Laterality: N/A;   URETER SURGERY     VAGINAL HYSTERECTOMY  2000   Active Ambulatory Problems    Diagnosis Date Noted   Chronic daily headache 11/16/2015   Tension headache 11/16/2015   Intractable chronic migraine without aura and with status migrainosus 12/02/2015   Obstructive uropathy 10/08/2016   Ureterolithiasis    Acute right flank pain    Intractable pain    Obesity, Class III, BMI 40-49.9 (morbid obesity) (HCC) 03/17/2014   Calculus of kidney 05/30/2011   Carpal tunnel syndrome 05/30/2011   Chronic cough 08/01/2017   Chronic pansinusitis 10/24/2015   Chronic renal disease, stage 3, moderately decreased glomerular filtration rate between 30-59 mL/min/1.73 square meter (HCC) 07/02/2016   Chronic tension-type headache, intractable 10/24/2015   Epigastric pain 03/17/2014   Essential hypertension 06/22/2016   Fatty (change of) liver, not elsewhere classified 03/17/2014   Hemorrhoids 10/24/2015   Gastroesophageal reflux disease 03/17/2014   Herpesviral vulvovaginitis 11/17/2014   Hyperlipidemia, unspecified 10/24/2015   Hypothyroidism, unspecified 10/23/2012   Lymphedema of right lower extremity 08/14/2019   Multiple gastric ulcers 04/16/2014   Myalgia, other site 10/08/2012   Obstructive sleep  apnea syndrome 10/18/2017   Prediabetes 08/18/2019   Recurrent major depressive disorder, in partial remission (HCC) 01/14/2018   Varicose veins of leg with pain, right 08/20/2019   Sialadenitis 10/17/2017   SOB (shortness of breath) 09/15/2017   Stress incontinence in female 08/14/2019   Stroke-like symptoms 08/20/2019   Thoracic back pain 10/23/2012   Vitamin B12 deficiency 08/18/2019   Vitamin D  deficiency 08/18/2019   Swelling of limb 08/28/2019   Pain in limb 08/28/2019   Osteopenia of multiple sites 08/31/2019   Chronic pain syndrome 10/22/2019   Chronic bilateral low back pain without sciatica 10/22/2019   SI (sacroiliac) joint inflammation (HCC) 10/22/2019   Bilateral hip pain 08/20/2019   Insect bite of left elbow 10/06/2019   Pruritic rash 09/11/2019   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) 12/15/2019   Pulmonary embolism (HCC) 02/16/2020   COPD with acute bronchitis (HCC) 02/16/2020   Chronic SI joint pain 10/22/2019   Morbid obesity (HCC) 04/04/2021   Cervical radiculopathy 11/21/2022   Right arm weakness 11/21/2022   Chronic right shoulder pain 08/27/2023   Disorder of right suprascapular nerve 08/27/2023   Rotator cuff arthropathy of right shoulder 08/27/2023   S/P cervical spinal fusion 08/27/2023   History of rotator cuff surgery (right) 08/27/2023   Fibromyalgia 08/27/2023   Resolved Ambulatory Problems    Diagnosis Date Noted   No Resolved Ambulatory Problems   Past Medical  History:  Diagnosis Date   Anxiety and depression    Arthritis    Chronic constipation    Chronic kidney disease (CKD)    Collagen vascular disease (HCC)    COPD (chronic obstructive pulmonary disease) (HCC)    CTEPH (chronic thromboembolic pulmonary hypertension) (HCC)    Fatty liver disease, nonalcoholic    GERD (gastroesophageal reflux disease)    Headache    Herpes simplex vulvovaginitis    Hiatal hernia    History of kidney stones    History of stomach ulcers     HLD (hyperlipidemia)    Hypertension    Liver disease    Personal history of COVID-19 12/2020   PONV (postoperative nausea and vomiting)    Pre-diabetes    Renal disorder    Seizures (HCC)    Stress incontinence (female) (female)    Stroke (HCC)    Vitamin B 12 deficiency    Constitutional Exam  General appearance: Well nourished, well developed, and well hydrated. In no apparent acute distress Vitals:   08/27/23 0815  BP: 103/77  Pulse: 79  Resp: 16  Temp: (!) 97.4 F (36.3 C)  TempSrc: Temporal  SpO2: 98%  Weight: 182 lb (82.6 kg)  Height: 5' 4 (1.626 m)   BMI Assessment: Estimated body mass index is 31.24 kg/m as calculated from the following:   Height as of this encounter: 5' 4 (1.626 m).   Weight as of this encounter: 182 lb (82.6 kg).  BMI interpretation table: BMI level Category Range association with higher incidence of chronic pain  <18 kg/m2 Underweight   18.5-24.9 kg/m2 Ideal body weight   25-29.9 kg/m2 Overweight Increased incidence by 20%  30-34.9 kg/m2 Obese (Class I) Increased incidence by 68%  35-39.9 kg/m2 Severe obesity (Class II) Increased incidence by 136%  >40 kg/m2 Extreme obesity (Class III) Increased incidence by 254%   Patient's current BMI Ideal Body weight  Body mass index is 31.24 kg/m. Ideal body weight: 54.7 kg (120 lb 9.5 oz) Adjusted ideal body weight: 65.8 kg (145 lb 2.5 oz)   BMI Readings from Last 4 Encounters:  08/27/23 31.24 kg/m  05/16/23 29.35 kg/m  04/26/23 29.39 kg/m  04/09/23 28.83 kg/m   Wt Readings from Last 4 Encounters:  08/27/23 182 lb (82.6 kg)  05/16/23 171 lb (77.6 kg)  04/26/23 171 lb 3.2 oz (77.7 kg)  04/09/23 170 lb 9.6 oz (77.4 kg)    Psych/Mental status: Alert, oriented x 3 (person, place, & time)       Eyes: PERLA Respiratory: No evidence of acute respiratory distress  Cervical Spine Area Exam  Skin & Axial Inspection: No masses, redness, edema, swelling, or associated skin  lesions Alignment: Symmetrical Functional ROM: Pain restricted ROM, bilaterally Stability: No instability detected Muscle Tone/Strength: Functionally intact. No obvious neuro-muscular anomalies detected. Sensory (Neurological): Unimpaired Palpation: No palpable anomalies             Upper Extremity (UE) Exam    Side: Right upper extremity  Side: Left upper extremity  Skin & Extremity Inspection: Skin color, temperature, and hair growth are WNL. No peripheral edema or cyanosis. No masses, redness, swelling, asymmetry, or associated skin lesions. No contractures.  Skin & Extremity Inspection: Skin color, temperature, and hair growth are WNL. No peripheral edema or cyanosis. No masses, redness, swelling, asymmetry, or associated skin lesions. No contractures.  Functional ROM: Pain restricted ROM          Functional ROM: Pain restricted ROM  Muscle Tone/Strength: Functionally intact. No obvious neuro-muscular anomalies detected.  Muscle Tone/Strength: Functionally intact. No obvious neuro-muscular anomalies detected.  Sensory (Neurological): Neurogenic pain pattern          Sensory (Neurological): Unimpaired          Palpation: No palpable anomalies              Palpation: No palpable anomalies              Provocative Test(s):  Phalen's test: deferred Tinel's test: deferred Apley's scratch test (touch opposite shoulder):  Action 1 (Across chest): Decreased ROM Action 2 (Overhead): Decreased ROM Action 3 (LB reach): Decreased ROM   Provocative Test(s):  Phalen's test: deferred Tinel's test: deferred Apley's scratch test (touch opposite shoulder):  Action 1 (Across chest): deferred Action 2 (Overhead): deferred Action 3 (LB reach): deferred    Thoracic Spine Area Exam  Skin & Axial Inspection: No masses, redness, or swelling Alignment: Symmetrical Functional ROM: Unrestricted ROM Stability: No instability detected Muscle Tone/Strength: Functionally intact. No obvious  neuro-muscular anomalies detected. Sensory (Neurological): Unimpaired Muscle strength & Tone: No palpable anomalies Lumbar Spine Area Exam  Skin & Axial Inspection: No masses, redness, or swelling Alignment: Symmetrical Functional ROM: Pain restricted ROM affecting both sides Stability: No instability detected Muscle Tone/Strength: Functionally intact. No obvious neuro-muscular anomalies detected. Sensory (Neurological): Musculoskeletal pain pattern  Gait & Posture Assessment  Ambulation: Unassisted Gait: Relatively normal for age and body habitus Posture: WNL  Lower Extremity Exam    Side: Right lower extremity  Side: Left lower extremity  Stability: No instability observed          Stability: No instability observed          Skin & Extremity Inspection: Skin color, temperature, and hair growth are WNL. No peripheral edema or cyanosis. No masses, redness, swelling, asymmetry, or associated skin lesions. No contractures.  Skin & Extremity Inspection: Skin color, temperature, and hair growth are WNL. No peripheral edema or cyanosis. No masses, redness, swelling, asymmetry, or associated skin lesions. No contractures.  Functional ROM: Unrestricted ROM                  Functional ROM: Unrestricted ROM                  Muscle Tone/Strength: Functionally intact. No obvious neuro-muscular anomalies detected.  Muscle Tone/Strength: Functionally intact. No obvious neuro-muscular anomalies detected.  Sensory (Neurological): Unimpaired        Sensory (Neurological): Unimpaired        DTR: Patellar: deferred today Achilles: deferred today Plantar: deferred today  DTR: Patellar: deferred today Achilles: deferred today Plantar: deferred today  Palpation: No palpable anomalies  Palpation: No palpable anomalies    Assessment  Primary Diagnosis & Pertinent Problem List: The primary encounter diagnosis was Chronic right shoulder pain. Diagnoses of Disorder of right suprascapular nerve, Rotator  cuff arthropathy of right shoulder, History of rotator cuff surgery (right), S/P cervical spinal fusion, Fibromyalgia, Chronic pain of both shoulders, and Suprascapular nerve entrapment, left were also pertinent to this visit.  Visit Diagnosis (New problems to examiner): 1. Chronic right shoulder pain   2. Disorder of right suprascapular nerve   3. Rotator cuff arthropathy of right shoulder   4. History of rotator cuff surgery (right)   5. S/P cervical spinal fusion   6. Fibromyalgia   7. Chronic pain of both shoulders   8. Suprascapular nerve entrapment, left    Plan of Care (Initial workup plan)  Note: Tammy Young was reminded that as per protocol, today's visit has been an evaluation only. We have not taken over the patient's controlled substance management.  Problem-specific plan: Assessment and Plan    Chronic Shoulder Pain Persistent right shoulder pain continues after rotator cuff surgery, worsened by fibromyalgia and previous cervical fusion. Prior treatments, including x-rays, MRIs, and injections, were ineffective. Suprascapular nerve blocks were discussed as a diagnostic tool, with nerve ablation or stimulation as potential follow-up if effective. Risks include possible lack of relief and need for further interventions. Plan to perform suprascapular nerve block on both shoulders with IV sedation, obtain approval for the procedure, and ensure a driver is available.  Chronic Neck Pain Bilateral neck pain persists following cervical fusion, with ongoing right-sided discomfort. The left side was previously treated with injections and surgery.   Fibromyalgia Chronic widespread pain remains unresponsive to gabapentin , Celebrex, and Cymbalta . Lyrica is discussed as a potential option, with sedation as a possible side effect. Consultation with a psychiatrist is advised for management.  General Health Maintenance Post-gastric bypass surgery, NSAIDs should be avoided due to potential  gastrointestinal complications.  Follow-up A follow-up with a psychiatrist is scheduled for next week.        Procedure Orders         SUPRASCAPULAR NERVE BLOCK     Provider-requested follow-up: Return in about 13 days (around 09/09/2023) for B/L SSNB, in clinic IV Versed .  Future Appointments  Date Time Provider Department Center  09/05/2023  8:30 AM Vickey Mettle, MD [facility] None  09/05/2023 10:30 AM Clois Fret, MD [facility] None    Duration of encounter: .  Total time on encounter, as per AMA guidelines included both the face-to-face and non-face-to-face time personally spent by the physician and/or other qualified health care professional(s) on the day of the encounter (includes time in activities that require the physician or other qualified health care professional and does not include time in activities normally performed by clinical staff). Physician's time may include the following activities when performed: Preparing to see the patient (e.g., pre-charting review of records, searching for previously ordered imaging, lab work, and nerve conduction tests) Review of prior analgesic pharmacotherapies. Reviewing PMP Interpreting ordered tests (e.g., lab work, imaging, nerve conduction tests) Performing post-procedure evaluations, including interpretation of diagnostic procedures Obtaining and/or reviewing separately obtained history Performing a medically appropriate examination and/or evaluation Counseling and educating the patient/family/caregiver Ordering medications, tests, or procedures Referring and communicating with other health care professionals (when not separately reported) Documenting clinical information in the electronic or other health record Independently interpreting results (not separately reported) and communicating results to the patient/ family/caregiver Care coordination (not separately reported)  Note by: Wallie Sherry, MD (AI and TTS  technology used. I apologize for any typographical errors that were not detected and corrected.) Date: 08/27/2023; Time: 9:29 AM

## 2023-08-27 NOTE — Patient Instructions (Signed)
______________________________________________________________________    Preparing for your procedure  Appointments: If you think you may not be able to keep your appointment, call 24-48 hours in advance to cancel. We need time to make it available to others.  Procedure visits are for procedures only. During your procedure appointment there will be: NO Prescription Refills*. NO medication changes or discussions*. NO discussion of disability issues*. NO unrelated pain problem evaluations*. NO evaluations to order other pain procedures*. *These will be addressed at a separate and distinct evaluation encounter on the provider's evaluation schedule and not during procedure days.  Instructions: Food intake: Avoid eating anything solid for at least 8 hours prior to your procedure. Clear liquid intake: You may take clear liquids such as water up to 2 hours prior to your procedure. (No carbonated drinks. No soda.) Transportation: Unless otherwise stated by your physician, bring a driver. (Driver cannot be a Market researcher, Pharmacist, community, or any other form of public transportation.) Morning Medicines: Except for blood thinners, take all of your other morning medications with a sip of water. Make sure to take your heart and blood pressure medicines. If your blood pressure's lower number is above 100, the case will be rescheduled. Blood thinners: Make sure to stop your blood thinners as instructed.  If you take a blood thinner, but were not instructed to stop it, call our office [phone] and ask to talk to a nurse. Not stopping a blood thinner prior to certain procedures could lead to serious complications. Diabetics on insulin: Notify the staff so that you can be scheduled 1st case in the morning. If your diabetes requires high dose insulin, take only  of your normal insulin dose the morning of the procedure and notify the staff that you have done so. Preventing infections: Shower with an antibacterial soap the  morning of your procedure.  Build-up your immune system: Take 1000 mg of Vitamin C with every meal (3 times a day) the day prior to your procedure. Antibiotics: Inform the nursing staff if you are taking any antibiotics or if you have any conditions that may require antibiotics prior to procedures. (Example: recent joint implants)   Pregnancy: If you are pregnant make sure to notify the nursing staff. Not doing so may result in injury to the fetus, including death.  Sickness: If you have a cold, fever, or any active infections, call and cancel or reschedule your procedure. Receiving steroids while having an infection may result in complications. Arrival: You must be in the facility at least 30 minutes prior to your scheduled procedure. Tardiness: Your scheduled time is also the cutoff time. If you do not arrive at least 15 minutes prior to your procedure, you will be rescheduled.  Children: Do not bring any children with you. Make arrangements to keep them home. Dress appropriately: There is always a possibility that your clothing may get soiled. Avoid long dresses. Valuables: Do not bring any jewelry or valuables.  Reasons to call and reschedule or cancel your procedure: (Following these recommendations will minimize the risk of a serious complication.) Surgeries: Avoid having procedures within 2 weeks of any surgery. (Avoid for 2 weeks before or after any surgery). Flu Shots: Avoid having procedures within 2 weeks of a flu shots or . (Avoid for 2 weeks before or after immunizations). Barium: Avoid having a procedure within 7-10 days after having had a radiological study involving the use of radiological contrast. (Myelograms, Barium swallow or enema study). Heart attacks: Avoid any elective procedures or surgeries for the  initial 6 months after a "Myocardial Infarction" (Heart Attack). Blood thinners: It is imperative that you stop these medications before procedures. Let us know if you if you take  any blood thinner.  Infection: Avoid procedures during or within two weeks of an infection (including chest colds or gastrointestinal problems). Symptoms associated with infections include: Localized redness, fever, chills, night sweats or profuse sweating, burning sensation when voiding, cough, congestion, stuffiness, runny nose, sore throat, diarrhea, nausea, vomiting, cold or Flu symptoms, recent or current infections. It is specially important if the infection is over the area that we intend to treat. Heart and lung problems: Symptoms that may suggest an active cardiopulmonary problem include: cough, chest pain, breathing difficulties or shortness of breath, dizziness, ankle swelling, uncontrolled high or unusually low blood pressure, and/or palpitations. If you are experiencing any of these symptoms, cancel your procedure and contact your primary care physician for an evaluation.  Remember:  Regular Business hours are:  Monday to Thursday 8:00 AM to 4:00 PM  Provider's Schedule: Delano Metz, MD:  Procedure days: Tuesday and Thursday 7:30 AM to 4:00 PM  Edward Jolly, MD:  Procedure days: Monday and Wednesday 7:30 AM to 4:00 PM Last  Updated: 07/02/2023 ______________________________________________________________________      ______________________________________________________________________    General Risks and Possible Complications  Patient Responsibilities: It is important that you read this as it is part of your informed consent. It is our duty to inform you of the risks and possible complications associated with treatments offered to you. It is your responsibility as a patient to read this and to ask questions about anything that is not clear or that you believe was not covered in this document.  Patient's Rights: You have the right to refuse treatment. You also have the right to change your mind, even after initially having agreed to have the treatment done. However,  under this last option, if you wait until the last second to change your mind, you may be charged for the materials used up to that point.  Introduction: Medicine is not an Visual merchandiser. Everything in Medicine, including the lack of treatment(s), carries the potential for danger, harm, or loss (which is by definition: Risk). In Medicine, a complication is a secondary problem, condition, or disease that can aggravate an already existing one. All treatments carry the risk of possible complications. The fact that a side effects or complications occurs, does not imply that the treatment was conducted incorrectly. It must be clearly understood that these can happen even when everything is done following the highest safety standards.  No treatment: You can choose not to proceed with the proposed treatment alternative. The "PRO(s)" would include: avoiding the risk of complications associated with the therapy. The "CON(s)" would include: not getting any of the treatment benefits. These benefits fall under one of three categories: diagnostic; therapeutic; and/or palliative. Diagnostic benefits include: getting information which can ultimately lead to improvement of the disease or symptom(s). Therapeutic benefits are those associated with the successful treatment of the disease. Finally, palliative benefits are those related to the decrease of the primary symptoms, without necessarily curing the condition (example: decreasing the pain from a flare-up of a chronic condition, such as incurable terminal cancer).  General Risks and Complications: These are associated to most interventional treatments. They can occur alone, or in combination. They fall under one of the following six (6) categories: no benefit or worsening of symptoms; bleeding; infection; nerve damage; allergic reactions; and/or death. No benefits or worsening  of symptoms: In Medicine there are no guarantees, only probabilities. No healthcare provider can  ever guarantee that a medical treatment will work, they can only state the probability that it may. Furthermore, there is always the possibility that the condition may worsen, either directly, or indirectly, as a consequence of the treatment. Bleeding: This is more common if the patient is taking a blood thinner, either prescription or over the counter (example: Goody Powders, Fish oil, Aspirin, Garlic, etc.), or if suffering a condition associated with impaired coagulation (example: Hemophilia, cirrhosis of the liver, low platelet counts, etc.). However, even if you do not have one on these, it can still happen. If you have any of these conditions, or take one of these drugs, make sure to notify your treating physician. Infection: This is more common in patients with a compromised immune system, either due to disease (example: diabetes, cancer, human immunodeficiency virus [HIV], etc.), or due to medications or treatments (example: therapies used to treat cancer and rheumatological diseases). However, even if you do not have one on these, it can still happen. If you have any of these conditions, or take one of these drugs, make sure to notify your treating physician. Nerve Damage: This is more common when the treatment is an invasive one, but it can also happen with the use of medications, such as those used in the treatment of cancer. The damage can occur to small secondary nerves, or to large primary ones, such as those in the spinal cord and brain. This damage may be temporary or permanent and it may lead to impairments that can range from temporary numbness to permanent paralysis and/or brain death. Allergic Reactions: Any time a substance or material comes in contact with our body, there is the possibility of an allergic reaction. These can range from a mild skin rash (contact dermatitis) to a severe systemic reaction (anaphylactic reaction), which can result in death. Death: In general, any medical  intervention can result in death, most of the time due to an unforeseen complication. ______________________________________________________________________     Suprascapular nerve block bilateral

## 2023-08-28 ENCOUNTER — Encounter: Payer: Self-pay | Admitting: Orthopedic Surgery

## 2023-08-29 ENCOUNTER — Telehealth: Payer: Self-pay

## 2023-08-29 ENCOUNTER — Ambulatory Visit: Payer: Medicaid Other | Admitting: Psychiatry

## 2023-08-29 ENCOUNTER — Encounter: Payer: Self-pay | Admitting: Psychiatry

## 2023-08-29 VITALS — BP 118/76 | HR 82 | Temp 97.2°F | Ht 64.0 in | Wt 181.2 lb

## 2023-08-29 DIAGNOSIS — F4310 Post-traumatic stress disorder, unspecified: Secondary | ICD-10-CM

## 2023-08-29 DIAGNOSIS — F3181 Bipolar II disorder: Secondary | ICD-10-CM | POA: Diagnosis not present

## 2023-08-29 DIAGNOSIS — Z79899 Other long term (current) drug therapy: Secondary | ICD-10-CM | POA: Diagnosis not present

## 2023-08-29 MED ORDER — METFORMIN HCL 500 MG PO TABS: 250.0000 mg | ORAL_TABLET | Freq: Every day | ORAL | 1 refills | Status: DC

## 2023-08-29 MED ORDER — FLUOXETINE HCL 20 MG PO CAPS: 20.0000 mg | ORAL_CAPSULE | Freq: Every day | ORAL | 0 refills | Status: DC

## 2023-08-29 NOTE — Telephone Encounter (Signed)
 pt called states that you sent the wrong mg of the risperidone . she thought you was going to send in 2mg  but a 1mg  was sent to the pharmacy. please confirm and if it is suppose to be increase to the 2mg  please send in new rx.   Pt was seen today 2-6 next appt 4-3

## 2023-08-29 NOTE — Patient Instructions (Signed)
 Increase risperidone  2 mg at night   Continue prazosin  4 mg at night  Continue fluoxetine  20 mg daily  Start metformin  250 mg at night  Continue hydroxyzine  25 mg twice a day as needed for anxiety  Obtain EKG - please call [phone]  to make an appointment  Next appointment: 4/3 at 8:30
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
## 2023-08-29 NOTE — Telephone Encounter (Signed)
 As seen in the chart, I did not send any new orders today. However, she is likely referring to the order, which should have been filled several days ago. The plan remains to increase Risperidone  to 2 mg at night. Please advise her to take two tablets to reach the 2 mg dose if she already filled it. If not, please contact the pharmacy to cancel 1 mg order, and I will send in 2 mg tab.

## 2023-08-29 NOTE — Telephone Encounter (Signed)
 Pt.notified

## 2023-08-30 ENCOUNTER — Other Ambulatory Visit: Payer: Self-pay | Admitting: Gastroenterology

## 2023-08-30 DIAGNOSIS — R195 Other fecal abnormalities: Secondary | ICD-10-CM

## 2023-09-04 ENCOUNTER — Ambulatory Visit
Admission: RE | Admit: 2023-09-04 | Discharge: 2023-09-04 | Disposition: A | Discharge status: Home / Self Care | Payer: Medicaid Other | Source: Ambulatory Visit | Attending: Gastroenterology | Admitting: Gastroenterology

## 2023-09-04 DIAGNOSIS — R195 Other fecal abnormalities: Secondary | ICD-10-CM | POA: Insufficient documentation

## 2023-09-05 ENCOUNTER — Ambulatory Visit: Payer: Medicaid Other | Admitting: Neurosurgery

## 2023-09-05 ENCOUNTER — Other Ambulatory Visit: Payer: Medicaid Other

## 2023-09-05 ENCOUNTER — Ambulatory Visit: Payer: Self-pay | Admitting: Psychiatry

## 2023-09-05 VITALS — BP 124/82 | Ht 64.0 in | Wt 181.0 lb

## 2023-09-05 DIAGNOSIS — M5412 Radiculopathy, cervical region: Secondary | ICD-10-CM | POA: Diagnosis not present

## 2023-09-05 DIAGNOSIS — M25512 Pain in left shoulder: Secondary | ICD-10-CM | POA: Diagnosis not present

## 2023-09-05 DIAGNOSIS — G8929 Other chronic pain: Secondary | ICD-10-CM

## 2023-09-05 NOTE — Progress Notes (Signed)
   Neurosurgery Note    Primary Care Provider Luciana Axe, NP [address] [place] [address] T: [phone] F: [phone]   History of Present Illness: Ms. Cotta is a 58 year old with a history of C5-7 ACDF on 11/21/2022.  Unfortunately she has had continued neck and arm pain.  Her arm pain is mostly centered around her shoulders.  She has significant pain around her shoulders with movement.  She is also still having some neck pain.  She has seen her orthopedic provider.  She has an MRI of the shoulder pending.  She is about to start Lyrica for her fibromyalgia      General Review of Systems:  A ROS was performed including pertinent positive and negatives as documented.  All other systems are negative.   DATA REVIEWED    Imaging Studies  MRI C spine 03/08/23   FINDINGS: Alignment: Exaggeration of the normal cervical lordosis. Trace retrolisthesis of C4 on C5.   Vertebrae: Prior ACDF at C5-C7. Vertebral body height maintained without acute or chronic fracture. Bone marrow signal intensity within normal limits. No discrete or worrisome osseous lesions. No abnormal marrow edema.   Cord: Normal signal and morphology.   Posterior Fossa, vertebral arteries, paraspinal tissues: Unremarkable.   Disc levels:   C2-C3: Unremarkable.   C3-C4: Small central disc protrusion mildly indents the ventral thecal sac. No spinal stenosis. Foramina remain patent.   C4-C5: Mild disc bulge with uncovertebral hypertrophy. Flattening of the ventral thecal sac without significant spinal stenosis. Mild right greater than left C5 foraminal narrowing.   C5-C6: Prior fusion. No residual spinal stenosis. Uncovertebral spurring with residual mild to moderate left greater than right C6 foraminal narrowing.   C6-C7: Prior fusion. No residual spinal stenosis. Foramina appear patent.   C7-T1:  Normal interspace.  No canal or foraminal stenosis.   IMPRESSION: 1. Prior ACDF  at C5-C7 without residual spinal stenosis. Uncovertebral spurring at C5-6 with residual mild to moderate left greater than right C6 foraminal narrowing. 2. Mild disc bulge with uncovertebral hypertrophy at C4-5 with resultant mild right greater than left C5 foraminal stenosis. 3. Small central disc protrusion at C3-4 without stenosis.     Electronically Signed   By: Rise Mu M.D.   On: 03/10/2023 18:38  X-rays of her cervical spine from April 09, 2023 show no complications   IMPRESSION  Ms. Bainter is a 58 y.o. female who has some symptoms of persistent left-sided cervical radiculopathy, though I think her more pressing issue is left shoulder related.  We will see what her MRI shows.  I have encouraged her to continue with her injections.  The other alternative is a spinal cord stimulator in the neck.  I have asked her to let me know when her MRI is performed.     I spent a total of 15 minutes in this patient's care today. This time was spent reviewing pertinent records including imaging studies, obtaining and confirming history, performing a directed evaluation, formulating and discussing my recommendations, and documenting the visit within the medical record.   Venetia Night, MD Neurosurgery

## 2023-09-06 ENCOUNTER — Ambulatory Visit
Admission: RE | Admit: 2023-09-06 | Discharge: 2023-09-06 | Disposition: A | Discharge status: Home / Self Care | Payer: Medicaid Other | Source: Ambulatory Visit | Attending: Orthopedic Surgery | Admitting: Orthopedic Surgery

## 2023-09-06 DIAGNOSIS — G8929 Other chronic pain: Secondary | ICD-10-CM | POA: Diagnosis present

## 2023-09-06 DIAGNOSIS — M25512 Pain in left shoulder: Secondary | ICD-10-CM | POA: Insufficient documentation

## 2023-09-09 ENCOUNTER — Telehealth: Payer: Self-pay

## 2023-09-09 NOTE — Telephone Encounter (Signed)
I received a denial for the ssnb. They didn't give a reason other than " There is not enough evidence in the clinical notes to support the use for this chronic pain" They gave me an option for a P2P. Do you want me to set it up?

## 2023-09-09 NOTE — Telephone Encounter (Signed)
I have the P2P set up for 2/20 Thursday at 1530 with Dr. Cliffton Asters. He will call you on your cell phone.

## 2023-09-10 ENCOUNTER — Encounter: Payer: Self-pay | Admitting: Student in an Organized Health Care Education/Training Program

## 2023-09-15 ENCOUNTER — Encounter: Payer: Self-pay | Admitting: Student in an Organized Health Care Education/Training Program

## 2023-09-16 ENCOUNTER — Telehealth: Payer: Self-pay | Admitting: Neurosurgery

## 2023-09-16 NOTE — Telephone Encounter (Signed)
 Patient is calling. She seen Dr.Yarbrough and he told her that her neck pain is coming from her shoulder. She saw Dr. Allena Katz today and he went over her MRI results in the office. He told her that her pain is coming from her neck and to see Dr.Yarbrough again. All she wants is pain relief. She feels she is a ping pong ball being hit from one provider to the other. What should she do?

## 2023-09-18 NOTE — Telephone Encounter (Signed)
 Per our conversation of pending approval of P2P, I called the health plan and they are not showing any decision from the P2P on 09/12/23. She states it could take up to 30 days for them to put in the overruling. I will call them back next week again, if the denial has not been overturned by then.

## 2023-09-20 ENCOUNTER — Encounter: Payer: Self-pay | Admitting: Neurosurgery

## 2023-09-20 ENCOUNTER — Other Ambulatory Visit: Payer: Self-pay | Admitting: Psychiatry

## 2023-09-20 ENCOUNTER — Telehealth: Payer: Self-pay

## 2023-09-20 MED ORDER — METFORMIN HCL 500 MG PO TABS: 500.0000 mg | ORAL_TABLET | Freq: Every day | ORAL | 0 refills | Status: DC

## 2023-09-20 NOTE — Telephone Encounter (Signed)
 Ordered metformin to take 500 mg daily.

## 2023-09-20 NOTE — Telephone Encounter (Signed)
 Patient called to request a refill for the following medication she is also requesting that the medication be increased to one tablet a day instead of a half a tablet a day please advise metFORMIN (GLUCOPHAGE) 500 MG tablet   Last visit 08-29-23 Next visit 10-24-23  Preferred pharmacy [facility] [phone] [facility], [place] - [address] Phone: [phone]  Fax: [phone]

## 2023-09-23 NOTE — Telephone Encounter (Signed)
 Called patient as instructed by provider to advise to take Metformin 500 mg daily patient voiced understanding

## 2023-09-26 ENCOUNTER — Other Ambulatory Visit: Payer: Self-pay | Admitting: Psychiatry

## 2023-09-30 NOTE — Telephone Encounter (Signed)
 They never overturned the denial. I submitted a new case today. They will probably deny again, and we will have to set up a P2P.

## 2023-10-04 ENCOUNTER — Other Ambulatory Visit: Payer: Self-pay | Admitting: Psychiatry

## 2023-10-04 ENCOUNTER — Telehealth: Payer: Self-pay

## 2023-10-04 MED ORDER — RISPERIDONE 2 MG PO TABS
2.0000 mg | ORAL_TABLET | Freq: Every day | ORAL | 0 refills | Status: DC
Start: 2023-10-04 — End: 2023-10-24

## 2023-10-04 NOTE — Telephone Encounter (Signed)
 pt called states that at her last visit dr. Vanetta Shawl increased her risperidone to 2mg  and she needs a rx sent to the pharmacy. she is out. pt last seen on 2-6 next appt 4-3

## 2023-10-04 NOTE — Telephone Encounter (Signed)
 Pt.notified

## 2023-10-07 ENCOUNTER — Other Ambulatory Visit: Payer: Self-pay | Admitting: Gastroenterology

## 2023-10-07 DIAGNOSIS — R195 Other fecal abnormalities: Secondary | ICD-10-CM

## 2023-10-07 DIAGNOSIS — R1013 Epigastric pain: Secondary | ICD-10-CM

## 2023-10-10 ENCOUNTER — Ambulatory Visit
Admission: RE | Admit: 2023-10-10 | Discharge: 2023-10-10 | Disposition: A | Discharge status: Home / Self Care | Source: Ambulatory Visit | Attending: Gastroenterology | Admitting: Gastroenterology

## 2023-10-17 ENCOUNTER — Other Ambulatory Visit: Payer: Self-pay | Admitting: Psychiatry
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
## 2023-10-21 ENCOUNTER — Ambulatory Visit (INDEPENDENT_AMBULATORY_CARE_PROVIDER_SITE_OTHER): Payer: Medicaid Other | Admitting: Professional Counselor

## 2023-10-21 DIAGNOSIS — F3181 Bipolar II disorder: Secondary | ICD-10-CM | POA: Diagnosis not present

## 2023-10-21 DIAGNOSIS — F4310 Post-traumatic stress disorder, unspecified: Secondary | ICD-10-CM | POA: Diagnosis not present
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
## 2023-10-24 ENCOUNTER — Ambulatory Visit
Admission: RE | Admit: 2023-10-24 | Discharge: 2023-10-24 | Disposition: A | Discharge status: Home / Self Care | Source: Ambulatory Visit | Attending: Psychiatry | Admitting: Psychiatry

## 2023-10-24 ENCOUNTER — Encounter: Payer: Self-pay | Admitting: Psychiatry

## 2023-10-24 ENCOUNTER — Ambulatory Visit (INDEPENDENT_AMBULATORY_CARE_PROVIDER_SITE_OTHER): Payer: Medicaid Other | Admitting: Psychiatry

## 2023-10-24 VITALS — BP 118/68 | HR 96 | Temp 98.3°F | Ht 64.0 in | Wt 177.4 lb

## 2023-10-24 DIAGNOSIS — Z79899 Other long term (current) drug therapy: Secondary | ICD-10-CM | POA: Diagnosis present

## 2023-10-24 DIAGNOSIS — M797 Fibromyalgia: Secondary | ICD-10-CM

## 2023-10-24 DIAGNOSIS — F4310 Post-traumatic stress disorder, unspecified: Secondary | ICD-10-CM

## 2023-10-24 DIAGNOSIS — Z5181 Encounter for therapeutic drug level monitoring: Secondary | ICD-10-CM | POA: Diagnosis not present

## 2023-10-24 DIAGNOSIS — F3181 Bipolar II disorder: Secondary | ICD-10-CM

## 2023-10-24 MED ORDER — RISPERIDONE 2 MG PO TABS: 2.0000 mg | ORAL_TABLET | Freq: Every day | ORAL | 0 refills | Status: DC

## 2023-10-24 MED ORDER — PRAZOSIN HCL 2 MG PO CAPS: 4.0000 mg | ORAL_CAPSULE | Freq: Every day | ORAL | 0 refills | Status: DC

## 2023-10-24 MED ORDER — HYDROXYZINE HCL 25 MG PO TABS: 25.0000 mg | ORAL_TABLET | Freq: Two times a day (BID) | ORAL | 0 refills | Status: AC | PRN

## 2023-10-24 NOTE — Patient Instructions (Signed)
 Obtain EKG  - please call [phone]  to make an appointment  Continue risperidone 2 mg at night   Continue metformin 500 mg at night  Continue prazosin 4 mg at night  Continue fluoxetine 20 mg daily  Continue hydroxyzine 25 mg twice a day as needed for anxiety  Next appointment: 5/8 at 3::30

## 2023-10-29 ENCOUNTER — Other Ambulatory Visit: Payer: Self-pay | Admitting: Gerontology

## 2023-10-29 DIAGNOSIS — Z1231 Encounter for screening mammogram for malignant neoplasm of breast: Secondary | ICD-10-CM

## 2023-11-03 ENCOUNTER — Encounter (INDEPENDENT_AMBULATORY_CARE_PROVIDER_SITE_OTHER): Payer: Self-pay

## 2023-11-03 DIAGNOSIS — F3161 Bipolar disorder, current episode mixed, mild: Secondary | ICD-10-CM

## 2023-11-04 ENCOUNTER — Ambulatory Visit
Attending: Student in an Organized Health Care Education/Training Program | Admitting: Student in an Organized Health Care Education/Training Program

## 2023-11-04 ENCOUNTER — Ambulatory Visit
Admission: RE | Admit: 2023-11-04 | Discharge: 2023-11-04 | Disposition: A | Discharge status: Home / Self Care | Source: Ambulatory Visit | Attending: Student in an Organized Health Care Education/Training Program | Admitting: Student in an Organized Health Care Education/Training Program

## 2023-11-04 ENCOUNTER — Encounter: Payer: Self-pay | Admitting: Student in an Organized Health Care Education/Training Program

## 2023-11-04 ENCOUNTER — Other Ambulatory Visit: Payer: Self-pay | Admitting: Psychiatry

## 2023-11-04 DIAGNOSIS — Z9889 Other specified postprocedural states: Secondary | ICD-10-CM | POA: Insufficient documentation

## 2023-11-04 DIAGNOSIS — M25511 Pain in right shoulder: Secondary | ICD-10-CM | POA: Insufficient documentation

## 2023-11-04 DIAGNOSIS — G5681 Other specified mononeuropathies of right upper limb: Secondary | ICD-10-CM | POA: Insufficient documentation

## 2023-11-04 DIAGNOSIS — G8929 Other chronic pain: Secondary | ICD-10-CM | POA: Insufficient documentation

## 2023-11-04 DIAGNOSIS — G5682 Other specified mononeuropathies of left upper limb: Secondary | ICD-10-CM | POA: Diagnosis present

## 2023-11-04 DIAGNOSIS — M25512 Pain in left shoulder: Secondary | ICD-10-CM | POA: Diagnosis present

## 2023-11-04 DIAGNOSIS — M12811 Other specific arthropathies, not elsewhere classified, right shoulder: Secondary | ICD-10-CM | POA: Diagnosis present

## 2023-11-04 MED ORDER — DEXAMETHASONE SODIUM PHOSPHATE 10 MG/ML IJ SOLN
20.0000 mg | Freq: Once | INTRAMUSCULAR | Status: AC
  Administered 2023-11-04: 20 mg
  Filled 2023-11-04: qty 2

## 2023-11-04 MED ORDER — MIDAZOLAM HCL 2 MG/2ML IJ SOLN
0.5000 mg | Freq: Once | INTRAMUSCULAR | Status: AC
  Administered 2023-11-04: 2 mg via INTRAVENOUS
  Filled 2023-11-04: qty 2

## 2023-11-04 MED ORDER — IOHEXOL 180 MG/ML  SOLN
10.0000 mL | Freq: Once | INTRAMUSCULAR | Status: AC
  Administered 2023-11-04: 10 mL via INTRATHECAL
  Filled 2023-11-04: qty 20

## 2023-11-04 MED ORDER — ROPIVACAINE HCL 2 MG/ML IJ SOLN
9.0000 mL | Freq: Once | INTRAMUSCULAR | Status: AC
  Administered 2023-11-04: 9 mL via PERINEURAL
  Filled 2023-11-04: qty 20

## 2023-11-04 MED ORDER — LIDOCAINE HCL 2 % IJ SOLN
20.0000 mL | Freq: Once | INTRAMUSCULAR | Status: AC
  Administered 2023-11-04: 400 mg
  Filled 2023-11-04: qty 20

## 2023-11-04 MED ORDER — RISPERIDONE 0.5 MG PO TABS: 0.5000 mg | ORAL_TABLET | Freq: Every day | ORAL | 0 refills | Status: DC

## 2023-11-04 MED ORDER — LACTATED RINGERS IV SOLN: Freq: Once | INTRAVENOUS | Status: AC

## 2023-11-04 NOTE — Progress Notes (Signed)
 PROVIDER NOTE: Interpretation of information contained herein should be left to medically-trained personnel. Specific patient instructions are provided elsewhere under "Patient Instructions" section of medical record. This document was created in part using STT-dictation technology, any transcriptional errors that may result from this process are unintentional.  Patient: Tammy Young Type: Established DOB: 04-22-1966 MRN: [id number] PCP: Luciana Axe, NP  Service: Procedure DOS: 11/04/2023 Setting: Ambulatory Location: Ambulatory outpatient facility Delivery: Face-to-face Provider: Edward Jolly, MD Specialty: Interventional Pain Management Specialty designation: 09 Location: Outpatient facility Ref. Prov.: Luciana Axe, NP       Interventional Therapy   Procedure: Suprascapular nerve block (SSNB) #1  Laterality:  Bilateral  Level: Superior to scapular spine, lateral to supraspinatus fossa (Suprascapular notch).  Imaging: Fluoroscopic guidance         Anesthesia: Local anesthesia (1-2% Lidocaine) Sedation: Minimal Sedation                       DOS: 11/04/2023  Performed by: Edward Jolly, MD  Purpose: Diagnostic/Therapeutic Indications: Shoulder pain, severe enough to impact quality of life and/or function. 1. Chronic right shoulder pain   2. Disorder of right suprascapular nerve   3. Rotator cuff arthropathy of right shoulder   4. History of rotator cuff surgery (right)   5. Chronic pain of both shoulders   6. Suprascapular nerve entrapment, left    NAS-11 score:   Pre-procedure: 6 /10   Post-procedure: 5 /10     Target: Suprascapular nerve Location: midway between the medial border of the scapula and the acromion as it runs through the suprascapular notch. Region: Suprascapular, posterior shoulder  Approach: Percutaneous  Neuroanatomy: The suprascapular nerve is the lateral branch of the superior trunk of the brachial plexus. It receives nerve fibers that  originate in the nerve roots C5 and C6 (and sometimes C4). It is a mixed nerve, meaning that it provides both sensory and motor supply for the suprascapular region. Function: The main function of this nerve is to provide motor innervation for two muscles, the supraspinatus and infraspinatus muscles. They are part of the rotator cuff muscles. In addition, the suprascapular nerve provides a sensory supply to the joints of the scapula (glenohumeral and acromioclavicular joints). Rationale (medical necessity): procedure needed and proper for the diagnosis and/or treatment of the patient's medical symptoms and needs.  Position / Prep / Materials:  Position: Prone Materials:  Tray: Block Needle(s):  Type: Spinal  Gauge (G): 22  Length: 3.5 in.  Qty: 1 Prep solution: ChloraPrep (2% chlorhexidine gluconate and 70% isopropyl alcohol) Prep Area: Entire posterior shoulder area. From upper spine to shoulder proper (upper arm), and from lateral neck to lower tip of shoulder blade.   H&P (Pre-op Assessment):  Tammy Young is a 58 y.o. (year old), female patient, seen today for interventional treatment. 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 Ms. Brodbeck has a current medication list which includes the following prescription(s): calcium, cyanocobalamin, ergocalciferol, fluoxetine, fluticasone,  fluticasone-salmeterol, hydrocodone-acetaminophen, hydroxyzine, linaclotide, metformin, multiple vitamins-minerals, ondansetron, pantoprazole, [START ON 11/14/2023] prazosin, risperidone, tirzepatide, tramadol, valacyclovir, albuterol, [DISCONTINUED] fexofenadine, [DISCONTINUED] rabeprazole, and [DISCONTINUED] ranitidine, and the following Facility-Administered Medications: gadopentetate dimeglumine, lactated ringers, and sodium chloride flush. Her primarily concern today is the Back Pain (Upper back)  Initial Vital Signs:  Pulse/HCG Rate: (!) 104ECG Heart Rate: 79 Temp: (!) 96.6 F (35.9 C) Resp: 16 BP: 117/73 SpO2: 97 %  BMI: Estimated body mass index is 30.38 kg/m as calculated from the following:   Height as of this encounter: 5\' 4"  (1.626 m).   Weight as of this encounter: 177 lb (80.3 kg).  Risk Assessment: Allergies: Reviewed. She is allergic to bee venom, nsaids, amoxicillin, and augmentin [amoxicillin-pot clavulanate].  Allergy Precautions: None required Coagulopathies: Reviewed. None identified.  Blood-thinner therapy: None at this time Active Infection(s): Reviewed. None identified. Tammy Young is afebrile  Site Confirmation: [name] was asked to confirm the procedure and laterality before marking the site Procedure checklist: Completed Consent: Before the procedure and under the influence of no sedative(s), amnesic(s), or anxiolytics, the patient was informed of the treatment options, risks and possible complications. To fulfill our ethical and legal obligations, as recommended by the American Medical Association's Code of Ethics, I have informed the patient of my clinical impression; the nature and purpose of the treatment or procedure; the risks, benefits, and possible complications of the  intervention; the alternatives, including doing nothing; the risk(s) and benefit(s) of the alternative treatment(s) or procedure(s); and the risk(s) and benefit(s) of doing nothing. The patient was provided information about the general risks and possible complications associated with the procedure. These may include, but are not limited to: failure to achieve desired goals, infection, bleeding, organ or nerve damage, allergic reactions, paralysis, and death. In addition, the patient was informed of those risks and complications associated to the procedure, such as failure to decrease pain; infection; bleeding; organ or nerve damage with subsequent damage to sensory, motor, and/or autonomic systems, resulting in permanent pain, numbness, and/or weakness of one or several areas of the body; allergic reactions; (i.e.: anaphylactic reaction); and/or death. Furthermore, the patient was informed of those risks and complications associated with the medications. These include, but are not limited to: allergic reactions (i.e.: anaphylactic or anaphylactoid reaction(s)); adrenal axis suppression; blood sugar elevation that in diabetics may result in ketoacidosis or comma; water retention that in patients with history of congestive heart failure may result in shortness of breath, pulmonary edema, and decompensation with resultant heart failure; weight gain; swelling or edema; medication-induced neural toxicity; particulate matter embolism and blood vessel occlusion with resultant organ, and/or nervous system infarction; and/or aseptic necrosis of one or more joints. Finally, the patient was informed that Medicine is not an exact science; therefore, there is also the possibility of unforeseen or unpredictable risks and/or possible complications that may result in a catastrophic outcome. The patient indicated having understood very clearly. We have given the patient no guarantees and we have made no promises. Enough time  was given to the patient to ask questions, all of which were answered to the patient's satisfaction. Ms. Zobrist has indicated that she wanted to continue with the procedure. Attestation: I, the ordering provider, attest that I have discussed with the patient the benefits, risks, side-effects, alternatives, likelihood of achieving goals, and potential problems during recovery for the procedure that I have provided informed consent. Date  Time: 11/04/2023  9:28 AM  Pre-Procedure Preparation:  Monitoring: As per clinic protocol. Respiration, ETCO2,  SpO2, BP, heart rate and rhythm monitor placed and checked for adequate function Safety Precautions: Patient was assessed for positional comfort and pressure points before starting the procedure. Time-out: I initiated and conducted the "Time-out" before starting the procedure, as per protocol. The patient was asked to participate by confirming the accuracy of the "Time Out" information. Verification of the correct person, site, and procedure were performed and confirmed by me, the nursing staff, and the patient. "Time-out" conducted as per Joint Commission's Universal Protocol (UP.01.01.01). Time: 1041 Start Time: 1042 hrs.  Description of Procedure:          Procedural Technique Safety Precautions: Aspiration looking for blood return was conducted prior to all injections. At no point did we inject any substances, as a needle was being advanced. No attempts were made at seeking any paresthesias. Safe injection practices and needle disposal techniques used. Medications properly checked for expiration dates. SDV (single dose vial) medications used. Description of the Procedure: Protocol guidelines were followed. The patient was placed in position over the procedure table. The target area was identified and the area prepped in the usual manner. Skin & deeper tissues infiltrated with local anesthetic. Appropriate amount of time allowed to pass for local anesthetics  to take effect. The procedure needles were then advanced to the target area. Proper needle placement secured. Negative aspiration confirmed. Solution injected in intermittent fashion, asking for systemic symptoms every 0.5cc of injectate. The needles were then removed and the area cleansed, making sure to leave some of the prepping solution back to take advantage of its long term bactericidal properties.  11 cc solution made of 9cc of 0.2% ropivacaine, 2 cc of Decadron 10 mg/cc. 5.5 cc injected for the left and right SSNs      Vitals:   11/04/23 1042 11/04/23 1047 11/04/23 1052 11/04/23 1057  BP: 108/81 106/68 112/82 114/73  Pulse: 81  79 80  Resp: 19 16 16 16   Temp:      SpO2: 98% 99% 100% 98%  Weight:      Height:         Start Time: 1042 hrs. End Time: 1048 hrs.  Imaging Guidance (Spinal):          Type of Imaging Technique: Fluoroscopy Guidance (Spinal) Indication(s): Fluoroscopy guidance for needle placement to enhance accuracy in procedures requiring precise needle localization for targeted delivery of medication in or near specific anatomical locations not easily accessible without such real-time imaging assistance. Exposure Time: Please see nurses notes. Contrast: Before injecting any contrast, we confirmed that the patient did not have an allergy to iodine, shellfish, or radiological contrast. Once satisfactory needle placement was completed at the desired level, radiological contrast was injected. Contrast injected under live fluoroscopy. No contrast complications. See chart for type and volume of contrast used. Fluoroscopic Guidance: I was personally present during the use of fluoroscopy. "Tunnel Vision Technique" used to obtain the best possible view of the target area. Parallax error corrected before commencing the procedure. "Direction-depth-direction" technique used to introduce the needle under continuous pulsed fluoroscopy. Once target was reached, antero-posterior,  oblique, and lateral fluoroscopic projection used confirm needle placement in all planes. Images permanently stored in EMR. Interpretation: I personally interpreted the imaging intraoperatively. Adequate needle placement confirmed in multiple planes. Appropriate spread of contrast into desired area was observed. No evidence of afferent or efferent intravascular uptake. No intrathecal or subarachnoid spread observed. Permanent images saved into the patient's record.  Antibiotic Prophylaxis:   Anti-infectives (From admission, onward)  None      Indication(s): None identified  Post-operative Assessment:  Post-procedure Vital Signs:  Pulse/HCG Rate: 8079 Temp: (!) 96.6 F (35.9 C) Resp: 16 BP: 114/73 SpO2: 98 %  EBL: None  Complications: No immediate post-treatment complications observed by team, or reported by patient.  Note: The patient tolerated the entire procedure well. A repeat set of vitals were taken after the procedure and the patient was kept under observation following institutional policy, for this type of procedure. Post-procedural neurological assessment was performed, showing return to baseline, prior to discharge. The patient was provided with post-procedure discharge instructions, including a section on how to identify potential problems. Should any problems arise concerning this procedure, the patient was given instructions to immediately contact us, at any time, without hesitation. In any case, we plan to contact the patient by telephone for a follow-up status report regarding this interventional procedure.  Comments:  No additional relevant information.  Plan of Care (POC)  Orders:  Orders Placed This Encounter  Procedures   DG PAIN CLINIC C-ARM 1-60 MIN NO REPORT    Intraoperative interpretation by procedural physician at [facility].    Standing Status:   Standing    Number of Occurrences:   1    Reason for exam::   Assistance in needle guidance  and placement for procedures requiring needle placement in or near specific anatomical locations not easily accessible without such assistance.     Medications ordered for procedure: Meds ordered this encounter  Medications   iohexol (OMNIPAQUE) 180 MG/ML injection 10 mL    Must be Myelogram-compatible. If not available, you may substitute with a water-soluble, non-ionic, hypoallergenic, myelogram-compatible radiological contrast medium.   lidocaine (XYLOCAINE) 2 % (with pres) injection 400 mg   lactated ringers infusion   midazolam (VERSED) injection 0.5-2 mg    Make sure Flumazenil is available in the pyxis when using this medication. If oversedation occurs, administer 0.2 mg IV over 15 sec. If after 45 sec no response, administer 0.2 mg again over 1 min; may repeat at 1 min intervals; not to exceed 4 doses (1 mg)   ropivacaine (PF) 2 mg/mL (0.2%) (NAROPIN) injection 9 mL   dexamethasone (DECADRON) injection 20 mg   Medications administered: We administered iohexol, lidocaine, lactated ringers, midazolam, ropivacaine (PF) 2 mg/mL (0.2%), and dexamethasone.  See the medical record for exact dosing, route, and time of administration.  Follow-up plan:   Return in about 4 weeks (around 12/02/2023) for PPE, F2F.       Recent Visits Date Type Provider Dept  08/27/23 Office Visit Edward Jolly, MD [facility]  Showing recent visits within past 90 days and meeting all other requirements Today's Visits Date Type Provider Dept  11/04/23 Procedure visit Edward Jolly, MD [facility]  Showing today's visits and meeting all other requirements Future Appointments Date Type Provider Dept  12/02/23 Appointment Edward Jolly, MD [facility]  Showing future appointments within next 90 days and meeting all other requirements  Disposition: Discharge home  Discharge (Date  Time): 11/04/2023; 1059 hrs.   Primary Care Physician: Luciana Axe, NP Location: [facility]  Outpatient Pain Management Facility Note by: Edward Jolly, MD (TTS technology used. I apologize for any typographical errors that were not detected and corrected.) Date: 11/04/2023; Time: 11:22 AM  Disclaimer:  Medicine is not an Visual merchandiser. The only guarantee in medicine is that nothing is guaranteed. It is important to note that the decision to proceed with this intervention  was based on the information collected from the patient. The Data and conclusions were drawn from the patient's questionnaire, the interview, and the physical examination. Because the information was provided in large part by the patient, it cannot be guaranteed that it has not been purposely or unconsciously manipulated. Every effort has been made to obtain as much relevant data as possible for this evaluation. It is important to note that the conclusions that lead to this procedure are derived in large part from the available data. Always take into account that the treatment will also be dependent on availability of resources and existing treatment guidelines, considered by other Pain Management Practitioners as being common knowledge and practice, at the time of the intervention. For Medico-Legal purposes, it is also important to point out that variation in procedural techniques and pharmacological choices are the acceptable norm. The indications, contraindications, technique, and results of the above procedure should only be interpreted and judged by a Board-Certified Interventional Pain Specialist with extensive familiarity and expertise in the same exact procedure and technique.

## 2023-11-04 NOTE — Progress Notes (Signed)
 Safety precautions to be maintained throughout the outpatient stay will include: orient to surroundings, keep bed in low position, maintain call bell within reach at all times, provide assistance with transfer out of bed and ambulation.

## 2023-11-04 NOTE — Patient Instructions (Signed)

## 2023-11-04 NOTE — Telephone Encounter (Signed)
 Called the patient to address her concerns.  She states that she has been feeling more irritable, anxious.  She wonders if it might be related to her son staying with her all the time. She eventually left the scene, and her symptoms subsided to some extent. She thought of going to ED due to this a few days ago.  Although she had a nonspecific HI, she denies any plan or intent. She denies SI.  Reviewed EKG. QTc H- 439 msec.  - will increase risperidone 4.5 mg at night  - keep the appointment in May - Emergency resources which includes 911, ED, suicide crisis line (988) are discussed.    I've spent a total time of 12 minutes providing service to this patient-generated inquiry in the MyChart message

## 2023-11-05 ENCOUNTER — Telehealth: Payer: Self-pay

## 2023-11-05 NOTE — Telephone Encounter (Signed)
 No issues post-procedure.

## 2023-11-05 NOTE — Telephone Encounter (Signed)
(  addendum) Correction about risperidone dosage. It is 2.5 mg at night

## 2023-11-07 ENCOUNTER — Ambulatory Visit
Admission: RE | Admit: 2023-11-07 | Discharge: 2023-11-07 | Disposition: A | Discharge status: Home / Self Care | Source: Ambulatory Visit | Attending: Gerontology | Admitting: Gerontology

## 2023-11-07 DIAGNOSIS — Z1231 Encounter for screening mammogram for malignant neoplasm of breast: Secondary | ICD-10-CM | POA: Diagnosis present

## 2023-11-18 ENCOUNTER — Encounter: Payer: Self-pay | Admitting: Nurse Practitioner

## 2023-11-18 ENCOUNTER — Ambulatory Visit: Attending: Nurse Practitioner | Admitting: Nurse Practitioner

## 2023-11-18 ENCOUNTER — Telehealth: Payer: Self-pay | Admitting: Student in an Organized Health Care Education/Training Program

## 2023-11-18 VITALS — Resp 16 | Ht 64.0 in | Wt 175.0 lb

## 2023-11-18 DIAGNOSIS — M25511 Pain in right shoulder: Secondary | ICD-10-CM | POA: Insufficient documentation

## 2023-11-18 DIAGNOSIS — G8929 Other chronic pain: Secondary | ICD-10-CM

## 2023-11-18 DIAGNOSIS — M25512 Pain in left shoulder: Secondary | ICD-10-CM | POA: Insufficient documentation

## 2023-11-18 DIAGNOSIS — M797 Fibromyalgia: Secondary | ICD-10-CM | POA: Diagnosis present

## 2023-11-18 MED ORDER — KETOROLAC TROMETHAMINE 60 MG/2ML IM SOLN: 30.0000 mg | Freq: Once | INTRAMUSCULAR | 0 refills | Status: DC

## 2023-11-18 MED ORDER — KETOROLAC TROMETHAMINE 30 MG/ML IJ SOLN: 30.0000 mg | Freq: Once | INTRAMUSCULAR | Status: DC

## 2023-11-18 MED ORDER — METHOCARBAMOL 1000 MG/10ML IJ SOLN
200.0000 mg | Freq: Once | INTRAMUSCULAR | Status: AC
  Administered 2023-11-18: 200 mg via INTRAMUSCULAR

## 2023-11-18 MED ORDER — KETOROLAC TROMETHAMINE 30 MG/ML IJ SOLN
30.0000 mg | Freq: Once | INTRAMUSCULAR | Status: AC
  Administered 2023-11-18: 30 mg via INTRAMUSCULAR

## 2023-11-18 MED ORDER — KETOROLAC TROMETHAMINE 30 MG/ML IJ SOLN
INTRAMUSCULAR | Status: AC
  Filled 2023-11-18: qty 1

## 2023-11-18 MED ORDER — METHOCARBAMOL 1000 MG/10ML IJ SOLN
INTRAMUSCULAR | Status: AC
  Filled 2023-11-18: qty 10

## 2023-11-18 NOTE — Telephone Encounter (Signed)
 Patient had SSNB 11-04-23. States she is not getting any relief so far. Would like to speak with a nurse to see if there is anything she can be doing until 5-13 visit with Dr Rhesa Celeste.

## 2023-11-18 NOTE — Telephone Encounter (Signed)
 Pain is worse after SSNB. Offered Toradol /Robaxin  injection. Message sent to S. Lydia Sams, NP.

## 2023-11-18 NOTE — Progress Notes (Signed)
 Patient here for IM injection for Toradol  and Robaxin . IM injections given and patient tolerated well.   Patient to return for follow-up with Dr. Rhesa Celeste.

## 2023-11-18 NOTE — Addendum Note (Signed)
 Addended by: Marthe Slain on: 11/18/2023 02:21 PM   Modules accepted: Level of Service

## 2023-11-20 ENCOUNTER — Telehealth: Payer: Self-pay | Admitting: Neurosurgery

## 2023-11-20 NOTE — Telephone Encounter (Signed)
 Patient is calling to let our office know that she saw Dr. Rhesa Celeste and had injections in which she had a couple days of relief on the left side but no relief on the right side. She states she went back Monday and had a Toradol  injection in which she had no relief. She is scheduled to follow up with Dr. Rhesa Celeste on 12/03/2023 and was recommended by their office to contact our office to request something for pain until she goes for her appointment as Dr. Rhesa Celeste is out of the office. Please advise.  [facility] in [place]

## 2023-11-20 NOTE — Telephone Encounter (Signed)
 Thoughts?  Should she ask her PCP for something for pain?

## 2023-11-20 NOTE — Telephone Encounter (Signed)
 Patient indicates understanding that medications should come from her PCP while Dr. Rhesa Celeste is out of the office. She had some questions about next steps after her injections with Dr. Rhesa Celeste and would like to discuss other potential surgical options. She mentioned that she would not like to move forward with a spinal cord stimulator but would like to discuss other potential surgical options.

## 2023-11-21 ENCOUNTER — Encounter (INDEPENDENT_AMBULATORY_CARE_PROVIDER_SITE_OTHER): Payer: Self-pay

## 2023-11-21 DIAGNOSIS — F411 Generalized anxiety disorder: Secondary | ICD-10-CM

## 2023-11-21 MED ORDER — HYDROXYZINE HCL 25 MG PO TABS
25.0000 mg | ORAL_TABLET | Freq: Three times a day (TID) | ORAL | 0 refills | Status: DC | PRN
Start: 2023-11-21 — End: 2023-11-25

## 2023-11-21 NOTE — Telephone Encounter (Signed)
 Called the patient in response to her message.  She cannot sit still, and has had challenges. She has noticed much difference since uptitration of risperidone . While we cannot rule out akathisia, she did have some effect from risperidone  for irritability.   Discussed the following plans.  - increase hydroxyzine  25 mg three times a day  Although the chart indicates that the medication was prescribed with a 90-day supply, it was only filled for one month. A new order will be sent.  I've spent a total time of 5 minutes providing service to this patient-generated inquiry in the MyChart message.

## 2023-11-22 NOTE — Telephone Encounter (Signed)
 Patient is scheduled for 12/05/23 with Dr. Mont Antis
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
## 2023-11-25 ENCOUNTER — Encounter: Payer: Self-pay | Admitting: Psychiatry

## 2023-11-25 ENCOUNTER — Ambulatory Visit (INDEPENDENT_AMBULATORY_CARE_PROVIDER_SITE_OTHER): Admitting: Psychiatry

## 2023-11-25 VITALS — BP 120/80 | HR 102 | Temp 98.7°F | Ht 64.0 in | Wt 176.6 lb

## 2023-11-25 DIAGNOSIS — F3161 Bipolar disorder, current episode mixed, mild: Secondary | ICD-10-CM | POA: Diagnosis not present

## 2023-11-25 DIAGNOSIS — F4310 Post-traumatic stress disorder, unspecified: Secondary | ICD-10-CM | POA: Diagnosis not present

## 2023-11-25 DIAGNOSIS — G2571 Drug induced akathisia: Secondary | ICD-10-CM | POA: Diagnosis not present

## 2023-11-25 MED ORDER — HYDROXYZINE HCL 25 MG PO TABS: 25.0000 mg | ORAL_TABLET | Freq: Three times a day (TID) | ORAL | 0 refills | Status: AC | PRN

## 2023-11-25 MED ORDER — FLUOXETINE HCL 20 MG PO CAPS: 20.0000 mg | ORAL_CAPSULE | Freq: Every day | ORAL | 0 refills | Status: DC

## 2023-11-25 MED ORDER — L-METHYLFOLATE 7.5 MG PO TABS
7.5000 mg | ORAL_TABLET | Freq: Every day | ORAL | 1 refills | Status: AC
Start: 2023-11-25 — End: 2024-01-24

## 2023-11-25 MED ORDER — RISPERIDONE 2 MG PO TABS: 2.0000 mg | ORAL_TABLET | Freq: Every day | ORAL | 0 refills | Status: DC

## 2023-11-25 NOTE — Patient Instructions (Signed)
 Decrease risperidone  2 mg  Continue metformin  500 mg at night  Resume and Increase prazosin  6 mg at night  Continue fluoxetine  20 mg daily  Start L-methylfolate 7.5 mg daily Continue hydroxyzine  50 mg in AM- 25 mg in PM a day as needed for anxiety  Next appointment: 6/19 at 10 AM
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
## 2023-11-26 ENCOUNTER — Ambulatory Visit: Admitting: Neurosurgery

## 2023-11-28 ENCOUNTER — Ambulatory Visit: Admitting: Psychiatry

## 2023-12-02 ENCOUNTER — Ambulatory Visit: Admitting: Student in an Organized Health Care Education/Training Program

## 2023-12-02 NOTE — Progress Notes (Unsigned)
 PROVIDER NOTE: Interpretation of information contained herein should be left to medically-trained personnel. Specific patient instructions are provided elsewhere under "Patient Instructions" section of medical record. This document was created in part using AI and STT-dictation technology, any transcriptional errors that may result from this process are unintentional.  Patient: Tammy Young  Service: E/M   PCP: Efraim Grange, NP  DOB: 1965/11/20  DOS: 12/03/2023  Provider: Cherylin Corrigan, NP  MRN: [id number]  Delivery: Face-to-face  Specialty: Interventional Pain Management  Type: Established Patient  Setting: Ambulatory outpatient facility  Specialty designation: 09  Referring Prov.: Efraim Grange, NP  Location: Outpatient office facility       HPI  [name], a 58 y.o. year old female, is here today because of her Fibromyalgia [M79.7]. [name] primary complain today is Shoulder Pain (both)   Pain Assessment: Severity of Chronic pain is reported as a 6 /10. Location: Shoulder Left, Right/pain radiaities across her shoulders, pain is in her neck. Onset: More than a month ago. Quality: Aching, Burning, Throbbing, Stabbing, Shooting, Radiating. Timing: Constant. Modifying factor(s): Meds. Vitals:  height is 5\' 4"  (1.626 m) and weight is 176 lb (79.8 kg). Her temperature is 97.3 F (36.3 C) (abnormal). Her blood pressure is 107/61 and her pulse is 93. Her oxygen saturation is 98%.  BMI: Estimated body mass index is 30.21 kg/m as calculated from the following:   Height as of this encounter: 5\' 4"  (1.626 m).   Weight as of this encounter: 176 lb (79.8 kg). Last encounter: 11/18/2023.  Reason for encounter: post-procedure evaluation and assessment.  The patient underwent Suprascapular nerve block on 11/04/2023.  She reported 100% pain relief during the duration of the local anesthetic and continues to experience 0 % ongoing improvement in her symptoms since the procedure.     Procedure: Suprascapular nerve block (SSNB) #1  Laterality:  Bilateral  Level: Superior to scapular spine, lateral to supraspinatus fossa (Suprascapular notch).  Imaging: Fluoroscopic guidance         Anesthesia: Local anesthesia (1-2% Lidocaine ) Sedation: Minimal Sedation                       DOS: 11/04/2023  Performed by: Cephus Collin, MD   Purpose: Diagnostic/Therapeutic Indications: Shoulder pain, severe enough to impact quality of life and/or function. 1. Chronic right shoulder pain   2. Disorder of right suprascapular nerve   3. Rotator cuff arthropathy of right shoulder   4. History of rotator cuff surgery (right)   5. Chronic pain of both shoulders   6. Suprascapular nerve entrapment, left     NAS-11 score:         Pre-procedure: 6 /10         Post-procedure: 5 /10   Effectiveness:  Initial hour after procedure:   100%. Subsequent 4-6 hours post-procedure:   100%. Analgesia past initial 6 hours:   0 %. Ongoing improvement:  Analgesic:  The patient underwent Suprascapular nerve block on 11/04/2023.  She reported 100% pain relief during the duration of the local anesthetic and continues to experience 0 % ongoing improvement in her symptoms since the procedure.  Function: Back to baseline ROM: Back to baseline   Pharmacotherapy Assessment  Analgesic: Hydrocodone -acetaminophen  (Norco) 5-325 mg tablet every 8 hours as needed for pain Monitoring: [place] PMP: PDMP reviewed during this encounter.       Pharmacotherapy: No side-effects or adverse reactions reported. Compliance: No problems identified. Effectiveness: Clinically acceptable.  Kathee Palm, RN  12/03/2023  1:30 PM  Sign when Signing Visit Safety precautions to be maintained throughout the outpatient stay will include: orient to surroundings, keep bed in low position, maintain call bell within reach at all times, provide assistance with transfer out of bed and ambulation.     No results found for:  "CBDTHCR" No results found for: "D8THCCBX" No results found for: "D9THCCBX"  UDS:  No results found for: "SUMMARY"    ROS  Constitutional: Denies any fever or chills Gastrointestinal: No reported hemesis, hematochezia, vomiting, or acute GI distress Musculoskeletal: Bilateral shoulder pain, limited ROM, stiffness Neurological: No reported episodes of acute onset apraxia, aphasia, dysarthria, agnosia, amnesia, paralysis, loss of coordination, or loss of consciousness  Medication Review  Calcium , Cyanocobalamin , FLUoxetine , HYDROcodone -acetaminophen , L-Methylfolate, Multiple Vitamins-Minerals, RABEprazole, albuterol , ergocalciferol , fexofenadine, fluticasone, fluticasone-salmeterol, hydrOXYzine , linaclotide , metFORMIN , ondansetron , pantoprazole , prazosin , ranitidine, risperiDONE , tirzepatide, traMADol , and valACYclovir   History Review  Allergy: [name] is allergic to bee venom, nsaids, amoxicillin, and augmentin [amoxicillin-pot clavulanate]. Drug: [name]  reports no history of drug use. Alcohol:  reports no history of alcohol use. Tobacco:  reports that she has never smoked. She has been exposed to tobacco smoke. She has never used smokeless tobacco. Social: [name]  reports that she has never smoked. She has been exposed to tobacco smoke. She has never used smokeless tobacco. She reports that she does not drink alcohol and does not use drugs. Medical:  has a past medical history of Anxiety and depression, Arthritis, Chronic constipation, Chronic cough, Chronic kidney disease (CKD), Collagen vascular disease (HCC), COPD (chronic obstructive pulmonary disease) (HCC), CTEPH (chronic thromboembolic pulmonary hypertension) (HCC), Fatty liver disease, nonalcoholic, GERD (gastroesophageal reflux disease), Headache, Hemorrhoids, Herpes simplex vulvovaginitis, Hiatal hernia, History of kidney stones, History of stomach ulcers, HLD (hyperlipidemia), Hypertension, Hypothyroidism, unspecified  (10/23/2012), Liver disease, Lymphedema of right lower extremity, Morbid obesity (HCC), Multiple gastric ulcers, Multiple gastric ulcers, Obstructive sleep apnea syndrome (10/18/2017), Personal history of COVID-19 (12/2020), PONV (postoperative nausea and vomiting), Pre-diabetes, Pulmonary embolism (HCC), Renal disorder, Seizures (HCC), Stress incontinence (female) (female), Stroke (HCC), Vitamin B 12 deficiency, and Vitamin D  deficiency. Surgical: Ms. 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 Family: family history includes Alcohol abuse in her father; Bipolar disorder in her mother; COPD in her mother; Diabetes in her maternal grandmother and mother; Drug abuse in her mother; Kidney Stones in her father and mother; Migraines in her maternal grandmother; Prostate cancer in her maternal grandfather; Stroke in her maternal grandmother.  Laboratory Chemistry Profile   Renal Lab Results  Component Value Date   BUN 13 04/05/2021   CREATININE 0.68 04/05/2021   GFRAA >60 02/17/2020   GFRNONAA >60 04/05/2021    Hepatic Lab Results  Component Value Date   AST 56 (H) 04/05/2021   ALT 54 (H)  04/05/2021   ALBUMIN 3.7 04/05/2021   ALKPHOS 51 04/05/2021   LIPASE 28 10/08/2016    Electrolytes Lab Results  Component Value Date  NA 139 04/05/2021   K 4.4 04/05/2021   CL 111 04/05/2021   CALCIUM  9.1 04/05/2021   MG 2.3 04/05/2021    Bone No results found for: "VD25OH", "VD125OH2TOT", "RU0454UJ8", "JX9147WG9", "25OHVITD1", "25OHVITD2", "25OHVITD3", "TESTOFREE", "TESTOSTERONE"  Inflammation (CRP: Acute Phase) (ESR: Chronic Phase) No results found for: "CRP", "ESRSEDRATE", "LATICACIDVEN"       Note: Above Lab results reviewed.  Recent Imaging Review  MM 3D SCREENING MAMMOGRAM BILATERAL BREAST CLINICAL DATA:  Screening.  EXAM: DIGITAL SCREENING BILATERAL MAMMOGRAM WITH TOMOSYNTHESIS AND CAD  TECHNIQUE: Bilateral screening digital craniocaudal and mediolateral oblique mammograms were obtained. Bilateral screening digital breast tomosynthesis was performed. The images were evaluated with computer-aided detection.  COMPARISON:  Previous exam(s).  ACR Breast Density Category b: There are scattered areas of fibroglandular density.  FINDINGS: There are no findings suspicious for malignancy.  IMPRESSION: No mammographic evidence of malignancy. A result letter of this screening mammogram will be mailed directly to the patient.  RECOMMENDATION: Screening mammogram in one year. (Code:SM-B-01Y)  BI-RADS CATEGORY  1: Negative.  Electronically Signed   By: Sande Cromer M.D.   On: 11/12/2023 12:30 Note: Reviewed         Physical Exam  General appearance: Well nourished, well developed, and well hydrated. In no apparent acute distress Mental status: Alert, oriented x 3 (person, place, & time)       Respiratory: No evidence of acute respiratory distress Eyes: PERLA Vitals: BP 107/61   Pulse 93   Temp (!) 97.3 F (36.3 C)   Ht 5\' 4"  (1.626 m)   Wt 176 lb (79.8 kg)   SpO2 98%   BMI 30.21 kg/m  BMI: Estimated body mass index is 30.21 kg/m as calculated from  the following:   Height as of this encounter: 5\' 4"  (1.626 m).   Weight as of this encounter: 176 lb (79.8 kg). Ideal: Ideal body weight: 54.7 kg (120 lb 9.5 oz) Adjusted ideal body weight: 64.8 kg (142 lb 12.1 oz)  Assessment   Diagnosis Status  1. Fibromyalgia   2. Suprascapular nerve entrapment, left   3. Chronic pain of both shoulders   4. Rotator cuff arthropathy of right shoulder   5. Disorder of right suprascapular nerve   6. Chronic right shoulder pain   7. History of rotator cuff surgery (right)    Controlled Controlled Controlled   Plan of Care  Assessment and Plan  Given the limited effectiveness of the nerve block and ongoing consistent pain, we will proceed with alternative options, including medication management for 1 month to evaluate its impact on symptoms relief  Started on hydrocodone -acetaminophen  (Norco) 5-325 mg tablet for 3 times daily for 30 days.  Schedule visit in 1 month.   Pharmacotherapy (Medications Ordered): Meds ordered this encounter  Medications   HYDROcodone -acetaminophen  (NORCO/VICODIN) 5-325 MG tablet    Sig: Take 1 tablet by mouth 3 (three) times daily as needed for severe pain (pain score 7-10). Must last 30 days    Dispense:  90 tablet    Refill:  0    Chronic Pain: STOP Act (Not applicable) Fill 1 day early if closed on refill date. Avoid benzodiazepines within 8 hours of opioids   Orders:  No orders of the defined types were placed in this encounter.  Follow-up plan:   Return in about 1 month (around 01/03/2024) for (F2F), (MM), Marthe Slain NP.    Recent Visits Date Type Provider Dept  11/18/23 Office Visit Berklee Battey K, NP [facility]  11/04/23 Procedure visit Cephus Collin, MD [facility]  Showing recent visits within past 90 days and meeting all other requirements Today's Visits Date Type Provider Dept  12/03/23 Office Visit Brewster Wolters K, NP [facility]  Showing today's visits and meeting  all other requirements Future Appointments Date Type Provider Dept  12/30/23 Appointment Nelson Julson K, NP [facility]  Showing future appointments within next 90 days and meeting all other requirements  I discussed the assessment and treatment plan with the patient. The patient was provided an opportunity to ask questions and all were answered. The patient agreed with the plan and demonstrated an understanding of the instructions.  Patient advised to call back or seek an in-person evaluation if the symptoms or condition worsens.  Duration of encounter: 30 minutes.  Total time on encounter, as per AMA guidelines included both the face-to-face and non-face-to-face time personally spent by the physician and/or other qualified health care professional(s) on the day of the encounter (includes time in activities that require the physician or other qualified health care professional and does not include time in activities normally performed by clinical staff). Physician's time may include the following activities when performed: Preparing to see the patient (e.g., pre-charting review of records, searching for previously ordered imaging, lab work, and nerve conduction tests) Review of prior analgesic pharmacotherapies. Reviewing PMP Interpreting ordered tests (e.g., lab work, imaging, nerve conduction tests) Performing post-procedure evaluations, including interpretation of diagnostic procedures Obtaining and/or reviewing separately obtained history Performing a medically appropriate examination and/or evaluation Counseling and educating the patient/family/caregiver Ordering medications, tests, or procedures Referring and communicating with other health care professionals (when not separately reported) Documenting clinical information in the electronic or other health record Independently interpreting results (not separately reported) and communicating results to the patient/  family/caregiver Care coordination (not separately reported)  Note by: Ginni Eichler K Lacora Folmer, NP (TTS and AI technology used. I apologize for any typographical errors that were not detected and corrected.) Date: 12/03/2023; Time: 2:49 PM

## 2023-12-03 ENCOUNTER — Encounter: Payer: Self-pay | Admitting: Nurse Practitioner

## 2023-12-03 ENCOUNTER — Ambulatory Visit: Attending: Student in an Organized Health Care Education/Training Program | Admitting: Nurse Practitioner

## 2023-12-03 VITALS — BP 107/61 | HR 93 | Temp 97.3°F | Ht 64.0 in | Wt 176.0 lb

## 2023-12-03 DIAGNOSIS — M12811 Other specific arthropathies, not elsewhere classified, right shoulder: Secondary | ICD-10-CM | POA: Diagnosis not present

## 2023-12-03 DIAGNOSIS — Z9889 Other specified postprocedural states: Secondary | ICD-10-CM | POA: Diagnosis present

## 2023-12-03 DIAGNOSIS — G5682 Other specified mononeuropathies of left upper limb: Secondary | ICD-10-CM | POA: Diagnosis not present

## 2023-12-03 DIAGNOSIS — G8929 Other chronic pain: Secondary | ICD-10-CM | POA: Insufficient documentation

## 2023-12-03 DIAGNOSIS — M25511 Pain in right shoulder: Secondary | ICD-10-CM | POA: Insufficient documentation

## 2023-12-03 DIAGNOSIS — M797 Fibromyalgia: Secondary | ICD-10-CM | POA: Diagnosis present

## 2023-12-03 DIAGNOSIS — M25512 Pain in left shoulder: Secondary | ICD-10-CM | POA: Diagnosis present

## 2023-12-03 DIAGNOSIS — G5681 Other specified mononeuropathies of right upper limb: Secondary | ICD-10-CM | POA: Insufficient documentation

## 2023-12-03 MED ORDER — HYDROCODONE-ACETAMINOPHEN 5-325 MG PO TABS: 1.0000 | ORAL_TABLET | Freq: Three times a day (TID) | ORAL | 0 refills | Status: DC | PRN

## 2023-12-03 NOTE — Progress Notes (Signed)
 Safety precautions to be maintained throughout the outpatient stay will include: orient to surroundings, keep bed in low position, maintain call bell within reach at all times, provide assistance with transfer out of bed and ambulation.

## 2023-12-05 ENCOUNTER — Ambulatory Visit (INDEPENDENT_AMBULATORY_CARE_PROVIDER_SITE_OTHER): Admitting: Neurosurgery

## 2023-12-05 ENCOUNTER — Encounter: Payer: Self-pay | Admitting: Neurosurgery

## 2023-12-05 VITALS — BP 128/82 | Ht 64.0 in | Wt 176.0 lb

## 2023-12-05 DIAGNOSIS — M961 Postlaminectomy syndrome, not elsewhere classified: Secondary | ICD-10-CM

## 2023-12-05 DIAGNOSIS — M5412 Radiculopathy, cervical region: Secondary | ICD-10-CM | POA: Diagnosis not present

## 2023-12-05 NOTE — Progress Notes (Signed)
   Neurosurgery Note    Primary Care Provider Efraim Grange, NP [id number] [place] [address] T: [phone] F: [phone]   History of Present Illness: Ms. Manginelli is a 58 year old with a history of C5-7 ACDF on 11/21/2022.  Unfortunately she has had continued neck and arm pain.    She additionally has tingling.   General Review of Systems:  A ROS was performed including pertinent positive and negatives as documented.  All other systems are negative.  MAEW.  DATA REVIEWED    Imaging Studies  MRI C spine 03/08/23   FINDINGS: Alignment: Exaggeration of the normal cervical lordosis. Trace retrolisthesis of C4 on C5.   Vertebrae: Prior ACDF at C5-C7. Vertebral body height maintained without acute or chronic fracture. Bone marrow signal intensity within normal limits. No discrete or worrisome osseous lesions. No abnormal marrow edema.   Cord: Normal signal and morphology.   Posterior Fossa, vertebral arteries, paraspinal tissues: Unremarkable.   Disc levels:   C2-C3: Unremarkable.   C3-C4: Small central disc protrusion mildly indents the ventral thecal sac. No spinal stenosis. Foramina remain patent.   C4-C5: Mild disc bulge with uncovertebral hypertrophy. Flattening of the ventral thecal sac without significant spinal stenosis. Mild right greater than left C5 foraminal narrowing.   C5-C6: Prior fusion. No residual spinal stenosis. Uncovertebral spurring with residual mild to moderate left greater than right C6 foraminal narrowing.   C6-C7: Prior fusion. No residual spinal stenosis. Foramina appear patent.   C7-T1:  Normal interspace.  No canal or foraminal stenosis.   IMPRESSION: 1. Prior ACDF at C5-C7 without residual spinal stenosis. Uncovertebral spurring at C5-6 with residual mild to moderate left greater than right C6 foraminal narrowing. 2. Mild disc bulge with uncovertebral hypertrophy at C4-5 with resultant mild right greater than  left C5 foraminal stenosis. 3. Small central disc protrusion at C3-4 without stenosis.     Electronically Signed   By: Virgia Griffins M.D.   On: 03/10/2023 18:38  X-rays of her cervical spine from April 09, 2023 show no complications   IMPRESSION  Ms. Cornacchia is a 58 y.o. female who has some symptoms of persistent left-sided cervical radiculopathy, though I think her more pressing issue is left shoulder related.   She has some symptoms of postlaminectomy type syndrome of her neck.  She may have chronic radiculopathy or pain from fibromyalgia.  I do not think that further surgery is indicated.  I recommended she consider a spinal cord stimulator.    I spent a total of 15 minutes in this patient's care today. This time was spent reviewing pertinent records including imaging studies, obtaining and confirming history, performing a directed evaluation, formulating and discussing my recommendations, and documenting the visit within the medical record.   Jodeen Munch, MD Neurosurgery

## 2023-12-05 NOTE — Patient Instructions (Signed)

## 2023-12-09 ENCOUNTER — Ambulatory Visit (INDEPENDENT_AMBULATORY_CARE_PROVIDER_SITE_OTHER): Admitting: Professional Counselor

## 2023-12-09 DIAGNOSIS — F331 Major depressive disorder, recurrent, moderate: Secondary | ICD-10-CM

## 2023-12-09 NOTE — Progress Notes (Signed)
  THERAPIST PROGRESS NOTE  Session Time: 9:02 AM - 9:53 AM   Participation Level: Active  Behavioral Response: Casual, Alert, Dysphoric  Type of Therapy: Individual Therapy  Treatment Goals addressed: Active OP Depression  LTG: "Not feeling that myself and everybody else around me would be better off if I was dead."     Start:  01/06/2024    Expected End:  12/07/24     STG: "To try to get out of the house more." To improve socialization AEB reducing depression sxs, increasing network and engaging in activities once a week for the next 12 weeks.    STG: "I would say not to stress about things I can't change." To improve stress management AEB utilizing coping mechanisms 3 out of 7 days for the next 90 days.    ProgressTowards Goals: Initial  Interventions: Motivational Interviewing, Solution Focused, and Supportive  Summary: [name] is a 58 y.o. female who presents with a history of depression, trauma, and mood disorder. She appeared alert and oriented x5. She stated things are going okay. [name] expressed her concerns about inability to make changes when she is stuck in the same environment. She engaged in developing her treatment plan. She actively listened to coping skills and engaged in 5-4-3-2-1 grounding mechanism. She was in agreement to practice skills between now and next session.   Therapist Response: Conducted session with [name]. Began session with check-in/update since previous session. Utilized empathetic and reflective listening. Developed treatment plan with input from [place] on current strengths, needs, and progress towards goals. Provided psychoeducation on coping skills and engaged [name] in 5-4-3-2-1 grounding mechanism. Scheduled additional appointment and concluded session.   Suicidal/Homicidal: No  Plan: Return again in 2 weeks.  Diagnosis: MDD (major depressive disorder), recurrent episode, moderate (HCC)  Collaboration of Care: Medication Management  AEB chart review  Patient/Guardian was advised Release of Information must be obtained prior to any record release in order to collaborate their care with an outside provider. Patient/Guardian was advised if they have not already done so to contact the registration department to sign all necessary forms in order for us  to release information regarding their care.   Consent: Patient/Guardian gives verbal consent for treatment and assignment of benefits for services provided during this visit. Patient/Guardian expressed understanding and agreed to proceed.   Len Quale, [facility] 01-06-2024

## 2023-12-23 ENCOUNTER — Ambulatory Visit: Admitting: Professional Counselor

## 2023-12-23 NOTE — Progress Notes (Deleted)
 Referring Physician:  Efraim Grange, NP [address] [place],  [place] [address]  Primary Physician:  Efraim Grange, NP  History of Present Illness: 12/23/2023 Ms. Macaiah Bares is here today with a chief complaint of ***  Discuss SCS   Duration: *** Location: *** Quality: *** Severity: ***  Precipitating: aggravated by *** Modifying factors: made better by *** Weakness: none Timing: *** Bowel/Bladder Dysfunction: none  Conservative measures:  Physical therapy: has not participated in PT  Multimodal medical therapy including regular antiinflammatories: hydrocodone   Injections: 11/04/2023 Suprascapular Nerve Block   Past Surgery: 11/21/2022 C5-7 ACDF  Keyaria DEISSY GUILBERT has ***no symptoms of cervical myelopathy.  The symptoms are causing a significant impact on the patient's life.   I have utilized the care everywhere function in epic to review the outside records available from external health systems.  Review of Systems:  A 10 point review of systems is negative, except for the pertinent positives and negatives detailed in the HPI.  Past Medical History: Past Medical History:  Diagnosis Date   Anxiety and depression    Arthritis    Chronic constipation    Chronic cough    Chronic kidney disease (CKD)    stage 3   Collagen vascular disease (HCC)    RA   COPD (chronic obstructive pulmonary disease) (HCC)    CTEPH (chronic thromboembolic pulmonary hypertension) (HCC)    Fatty liver disease, nonalcoholic    GERD (gastroesophageal reflux disease)    Headache    Hemorrhoids    Herpes simplex vulvovaginitis    Hiatal hernia    History of kidney stones    History of stomach ulcers    HLD (hyperlipidemia)    Hypertension    Resolved after wt loss   Hypothyroidism, unspecified 10/23/2012   Liver disease    Lymphedema of right lower extremity    Morbid obesity (HCC)    Multiple gastric ulcers    Multiple gastric ulcers    Obstructive sleep apnea  syndrome 10/18/2017   Personal history of COVID-19 12/2020   PONV (postoperative nausea and vomiting)    Pre-diabetes    Pulmonary embolism (HCC)    HEART CATH DONE 12/2020 AND MRI DONE - PULMONARY EMBOLIS - GONE   Renal disorder    kidney stones and shortened ureter repaired with surgery as a child.   Seizures (HCC)    only had with preeclampsia during pregnancy at age 49. none since   Stress incontinence (female) (female)    Stroke [facility])    Identified on CT.  No deficits   Vitamin B 12 deficiency    Vitamin D  deficiency     Past Surgical History: Past Surgical History:  Procedure Laterality Date   ANTERIOR CERVICAL DECOMP/DISCECTOMY FUSION N/A 11/21/2022   Procedure: C5-7 ANTERIOR CERVICAL DISCECTOMY AND FUSION;  Surgeon: Jodeen Munch, MD;  Location: [facility];  Service: Neurosurgery;  Laterality: N/A;   BIOPSY  04/26/2023   Procedure: BIOPSY;  Surgeon: Shane Darling, MD;  Location: [facility];  Service: Endoscopy;;   BREAST SURGERY     reduction   CARPAL TUNNEL RELEASE Bilateral    CHOLECYSTECTOMY     COLONOSCOPY WITH PROPOFOL  N/A 04/26/2023   Procedure: COLONOSCOPY WITH PROPOFOL ;  Surgeon: Shane Darling, MD;  Location: [facility];  Service: Endoscopy;  Laterality: N/A;  NEEDS TO BE ABOUT 7:30 DUE TO TRANSPORTATION   CYSTOSCOPY WITH URETEROSCOPY, STONE BASKETRY AND STENT PLACEMENT  10/09/2016   Procedure: CYSTOSCOPY WITH URETEROSCOPY,  STONE BASKETRY AND STENT PLACEMENT;  Surgeon: Dustin Gimenez, MD;  Location: [facility];  Service: Urology;;   DIRECT LARYNGOSCOPY Left 04/15/2015   Procedure: DIRECT LARYNGOSCOPY BIOPSY AND LASER LEFT TONGUE LESION;  Surgeon: Vernadine Golas, MD;  Location: [facility];  Service: ENT;  Laterality: Left;   ESOPHAGEAL BRUSHING  04/26/2023   Procedure: ESOPHAGEAL BRUSHING;  Surgeon: Shane Darling, MD;  Location: [facility];  Service: Endoscopy;;   ESOPHAGOGASTRODUODENOSCOPY (EGD) WITH PROPOFOL  N/A 09/03/2019    Procedure: ESOPHAGOGASTRODUODENOSCOPY (EGD) WITH PROPOFOL ;  Surgeon: Toledo, Alphonsus Jeans, MD;  Location: [facility];  Service: Gastroenterology;  Laterality: N/A;   ESOPHAGOGASTRODUODENOSCOPY (EGD) WITH PROPOFOL  N/A 02/03/2021   Procedure: ESOPHAGOGASTRODUODENOSCOPY (EGD) WITH PROPOFOL ;  Surgeon: Shane Darling, MD;  Location: [facility];  Service: Endoscopy;  Laterality: N/A;  ELIQUIS    ESOPHAGOGASTRODUODENOSCOPY (EGD) WITH PROPOFOL  N/A 04/26/2023   Procedure: ESOPHAGOGASTRODUODENOSCOPY (EGD) WITH PROPOFOL ;  Surgeon: Shane Darling, MD;  Location: [facility];  Service: Endoscopy;  Laterality: N/A;   GASTRIC ROUX-EN-Y N/A 04/04/2021   Procedure: LAPAROSCOPIC ROUX-EN-Y GASTRIC BYPASS WITH UPPER ENDOSCOPY;  Surgeon: Adalberto Acton, MD;  Location: [facility];  Service: General;  Laterality: N/A;   LEFT HEART CATH     POLYPECTOMY  04/26/2023   Procedure: POLYPECTOMY;  Surgeon: Shane Darling, MD;  Location: [facility];  Service: Endoscopy;;   REDUCTION MAMMAPLASTY     RIGHT HEART CATH N/A 06/02/2020   Procedure: RIGHT HEART CATH;  Surgeon: Antonette Batters, MD;  Location: [facility];  Service: Cardiovascular;  Laterality: N/A;   SHOULDER ARTHROSCOPY Right 01/01/2022   Procedure: Right shoulder arthroscopic rotator cuff repair, subscapularis repaire, subpectoral biceps tenodesis, distal clavical excision;  Surgeon: Lorri Rota, MD;  Location: [facility];  Service: Orthopedics;  Laterality: Right;   TONSILLECTOMY     UPPER GI ENDOSCOPY N/A 04/04/2021   Procedure: UPPER GI ENDOSCOPY;  Surgeon: Adalberto Acton, MD;  Location: [facility];  Service: General;  Laterality: N/A;   URETER SURGERY     VAGINAL HYSTERECTOMY  2000    Allergies: Allergies as of 12/25/2023 - Review Complete 12/05/2023  Allergen Reaction Noted   Bee venom Other (See Comments) 04/04/2021   Nsaids  11/06/2022   Amoxicillin Rash 09/03/2019   Augmentin [amoxicillin-pot clavulanate]  Itching and Rash 10/03/2018    Medications:  Current Outpatient Medications:    ergocalciferol  (VITAMIN D2) 1.25 MG (50000 UT) capsule, Take 50,000 Units by mouth once a week., Disp: , Rfl:    FLUoxetine  (PROZAC ) 20 MG capsule, Take 1 capsule (20 mg total) by mouth daily., Disp: 90 capsule, Rfl: 0   fluticasone (FLONASE) 50 MCG/ACT nasal spray, Place 2 sprays into both nostrils daily., Disp: , Rfl:    HYDROcodone -acetaminophen  (NORCO/VICODIN) 5-325 MG tablet, Take 1 tablet by mouth 3 (three) times daily as needed for severe pain (pain score 7-10). Must last 30 days, Disp: 90 tablet, Rfl: 0   hydrOXYzine  (ATARAX ) 25 MG tablet, Take 1 tablet (25 mg total) by mouth 2 (two) times daily as needed for anxiety., Disp: 180 tablet, Rfl: 0   hydrOXYzine  (ATARAX ) 25 MG tablet, Take 1 tablet (25 mg total) by mouth 3 (three) times daily as needed for anxiety., Disp: 270 tablet, Rfl: 0   L-Methylfolate 7.5 MG TABS, Take 1 tablet (7.5 mg total) by mouth daily., Disp: 30 tablet, Rfl: 1   linaclotide  (LINZESS ) 145 MCG CAPS capsule, Take 145 mcg by mouth daily before breakfast., Disp: , Rfl:  metFORMIN  (GLUCOPHAGE ) 500 MG tablet, Take 1 tablet (500 mg total) by mouth daily., Disp: 90 tablet, Rfl: 0   Multiple Vitamins-Minerals (BARIATRIC MULTIVITAMINS/IRON PO), Take by mouth daily., Disp: , Rfl:    ondansetron  (ZOFRAN -ODT) 4 MG disintegrating tablet, Take 4 mg by mouth every 8 (eight) hours as needed for nausea or vomiting., Disp: , Rfl:    pantoprazole  (PROTONIX ) 40 MG tablet, Take 1 tablet (40 mg total) by mouth daily., Disp: 90 tablet, Rfl: 0   prazosin  (MINIPRESS ) 2 MG capsule, Take 2 capsules (4 mg total) by mouth at bedtime., Disp: 180 capsule, Rfl: 0   risperiDONE  (RISPERDAL ) 2 MG tablet, Take 1 tablet (2 mg total) by mouth at bedtime., Disp: 90 tablet, Rfl: 0   valACYclovir  (VALTREX ) 500 MG tablet, Take 500 mg by mouth 2 (two) times daily as needed (outbreak)., Disp: , Rfl:  No current  facility-administered medications for this visit.  Facility-Administered Medications Ordered in Other Visits:    gadopentetate dimeglumine  (MAGNEVIST ) injection 20 mL, 20 mL, Intravenous, Once PRN, Sethi, Pramod S, MD   sodium chloride  flush (NS) 0.9 % injection 3 mL, 3 mL, Intravenous, Q12H, Fath, Wiliam Harder, MD  Social History: Social History   Tobacco Use   Smoking status: Never    Passive exposure: Yes   Smokeless tobacco: Never   Tobacco comments:    mother smokes outside/ mother deceased  Vaping Use   Vaping status: Never Used  Substance Use Topics   Alcohol use: Never    Comment: rarely   Drug use: No    Family Medical History: Family History  Problem Relation Age of Onset   Bipolar disorder Mother    Kidney Stones Mother    Diabetes Mother    COPD Mother    Drug abuse Mother    Alcohol abuse Father    Kidney Stones Father    Prostate cancer Maternal Grandfather    Migraines Maternal Grandmother    Stroke Maternal Grandmother    Diabetes Maternal Grandmother    Kidney cancer Neg Hx    Bladder Cancer Neg Hx     Physical Examination: There were no vitals filed for this visit.  General: Patient is in no apparent distress. Attention to examination is appropriate.  Neck:   Supple.  Full range of motion.  Respiratory: Patient is breathing without any difficulty.   NEUROLOGICAL:     Awake, alert, oriented to person, place, and time.  Speech is clear and fluent.   Cranial Nerves: Pupils equal round and reactive to light.  Facial tone is symmetric.  Facial sensation is symmetric. Shoulder shrug is symmetric. Tongue protrusion is midline.    Strength: Side Biceps Triceps Deltoid Interossei Grip Wrist Ext. Wrist Flex.  R 5 5 5 5 5 5 5   L 5 5 5 5 5 5 5    Side Iliopsoas Quads Hamstring PF DF EHL  R 5 5 5 5 5 5   L 5 5 5 5 5 5    Reflexes are ***2+ and symmetric at the biceps, triceps, brachioradialis, patella and achilles.   Hoffman's is absent. Clonus is  absent  Bilateral upper and lower extremity sensation is intact to light touch ***.     No evidence of dysmetria noted.  Gait is normal.    Imaging: *** I have personally reviewed the images and agree with the above interpretation.  Medical Decision Making/Assessment and Plan: Ms. Hamor is a pleasant 58 y.o. female with ***  There are no diagnoses linked to  this encounter.   Thank you for involving me in the care of this patient.    Carroll Clamp MD/MSCR Neurosurgery
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
## 2023-12-24 ENCOUNTER — Encounter: Payer: Self-pay | Admitting: Psychiatry

## 2023-12-24 ENCOUNTER — Other Ambulatory Visit: Payer: Self-pay

## 2023-12-24 ENCOUNTER — Ambulatory Visit (INDEPENDENT_AMBULATORY_CARE_PROVIDER_SITE_OTHER): Admitting: Psychiatry

## 2023-12-24 VITALS — BP 118/82 | HR 84 | Temp 97.3°F | Ht 64.0 in | Wt 176.6 lb

## 2023-12-24 DIAGNOSIS — F3161 Bipolar disorder, current episode mixed, mild: Secondary | ICD-10-CM

## 2023-12-24 DIAGNOSIS — F4310 Post-traumatic stress disorder, unspecified: Secondary | ICD-10-CM

## 2023-12-24 DIAGNOSIS — G2571 Drug induced akathisia: Secondary | ICD-10-CM

## 2023-12-24 MED ORDER — BENZTROPINE MESYLATE 1 MG PO TABS: 1.0000 mg | ORAL_TABLET | Freq: Every day | ORAL | 1 refills | Status: DC

## 2023-12-24 NOTE — Patient Instructions (Signed)
 Continue risperidone  2 mg  Continue metformin  500 mg at night  Start benztropine 1 mg at night  Continue prazosin  2 mg  Continue fluoxetine  20 mg daily  Continue hydroxyzine  50 mg in AM- 25 mg in PM a day as needed for anxiety  Next appointment: 7/15 at 8 am

## 2023-12-25 ENCOUNTER — Ambulatory Visit: Admitting: Neurosurgery

## 2023-12-30 ENCOUNTER — Encounter: Payer: Self-pay | Admitting: Nurse Practitioner

## 2023-12-30 ENCOUNTER — Ambulatory Visit: Attending: Nurse Practitioner | Admitting: Nurse Practitioner

## 2023-12-30 VITALS — BP 109/86 | HR 75 | Temp 96.6°F | Resp 16 | Ht 64.0 in | Wt 176.0 lb

## 2023-12-30 DIAGNOSIS — M25511 Pain in right shoulder: Secondary | ICD-10-CM | POA: Insufficient documentation

## 2023-12-30 DIAGNOSIS — G8929 Other chronic pain: Secondary | ICD-10-CM | POA: Diagnosis present

## 2023-12-30 DIAGNOSIS — G894 Chronic pain syndrome: Secondary | ICD-10-CM | POA: Insufficient documentation

## 2023-12-30 DIAGNOSIS — Z981 Arthrodesis status: Secondary | ICD-10-CM | POA: Insufficient documentation

## 2023-12-30 DIAGNOSIS — G5681 Other specified mononeuropathies of right upper limb: Secondary | ICD-10-CM | POA: Insufficient documentation

## 2023-12-30 DIAGNOSIS — M25512 Pain in left shoulder: Secondary | ICD-10-CM | POA: Insufficient documentation

## 2023-12-30 DIAGNOSIS — Z9889 Other specified postprocedural states: Secondary | ICD-10-CM | POA: Insufficient documentation

## 2023-12-30 DIAGNOSIS — M12811 Other specific arthropathies, not elsewhere classified, right shoulder: Secondary | ICD-10-CM | POA: Diagnosis not present

## 2023-12-30 DIAGNOSIS — G5682 Other specified mononeuropathies of left upper limb: Secondary | ICD-10-CM | POA: Diagnosis present

## 2023-12-30 DIAGNOSIS — Z79899 Other long term (current) drug therapy: Secondary | ICD-10-CM | POA: Insufficient documentation

## 2023-12-30 DIAGNOSIS — M797 Fibromyalgia: Secondary | ICD-10-CM | POA: Insufficient documentation

## 2023-12-30 MED ORDER — HYDROCODONE-ACETAMINOPHEN 5-325 MG PO TABS: 1.0000 | ORAL_TABLET | Freq: Three times a day (TID) | ORAL | 0 refills | Status: DC | PRN

## 2023-12-30 MED ORDER — HYDROCODONE-ACETAMINOPHEN 5-325 MG PO TABS: 1.0000 | ORAL_TABLET | Freq: Three times a day (TID) | ORAL | 0 refills | Status: AC | PRN

## 2023-12-30 NOTE — Progress Notes (Signed)
 PROVIDER NOTE: Interpretation of information contained herein should be left to medically-trained personnel. Specific patient instructions are provided elsewhere under "Patient Instructions" section of medical record. This document was created in part using AI and STT-dictation technology, any transcriptional errors that may result from this process are unintentional.  Patient: Tammy Young  Service: E/M   PCP: Efraim Grange, NP  DOB: [birth year]  DOS: 12/30/2023  Provider: Cherylin Corrigan, NP  MRN: [id number]  Delivery: Face-to-face  Specialty: Interventional Pain Management  Type: Established Patient  Setting: Ambulatory outpatient facility  Specialty designation: 09  Referring Prov.: Efraim Grange, NP  Location: Outpatient office facility       History of present illness (HPI) Tammy Young, a 58 y.o. year old female, is here today because of her Chronic pain syndrome [G89.4]. Tammy Young primary complain today is Neck Pain  Pertinent problems: Tammy Young has a Chronic pain syndrome; Fibromyalgia; Chronic pain of both shoulders; History of rotator cuff surgery (Right); Rotator cuff arthropathy of right shoulder; Suprascapular nerve entrapment (Left); and S/P Cervical spinal fusion on their pertinent problem list  Pain Assessment: Severity of Chronic pain is reported as a 6 /10. Location: Neck  /both shoulders, left upper arm. Onset: More than a month ago. Quality: Burning, Throbbing. Timing: Constant. Modifying factor(s): Hydrocodone . Vitals:  height is 5\' 4"  (1.626 m) and weight is 176 lb (79.8 kg). Her temporal temperature is 96.6 F (35.9 C) (abnormal). Her blood pressure is 109/86 and her pulse is 75. Her respiration is 16 and oxygen saturation is 100%.  BMI: Estimated body mass index is 30.21 kg/m as calculated from the following:   Height as of this encounter: 5\' 4"  (1.626 m).   Weight as of this encounter: 176 lb (79.8 kg).  Last encounter: 12/03/2023. Last procedure:  11/04/2023  Reason for encounter: medication management. No change in medical history since last visit.  Patient's pain is at baseline.  Patient continues multimodal pain regimen as prescribed.  States that it provides pain relief and improvement in functional status.   Pharmacotherapy Assessment   Analgesic:Hydrocodone -acetaminophen  (Norco) 5-325 mg tablet every 8 hours as needed for pain. MME=15  Monitoring: [place] PMP: PDMP reviewed during this encounter.       Pharmacotherapy: No side-effects or adverse reactions reported. Compliance: No problems identified. Effectiveness: Clinically acceptable.  Calton Catholic, RN  12/30/2023  9:33 AM  Sign when Signing Visit Nursing Pain Medication Assessment:  Safety precautions to be maintained throughout the outpatient stay will include: orient to surroundings, keep bed in low position, maintain call bell within reach at all times, provide assistance with transfer out of bed and ambulation.  Medication Inspection Compliance: Pill count conducted under aseptic conditions, in front of the patient. Neither the pills nor the bottle was removed from the patient's sight at any time. Once count was completed pills were immediately returned to the patient in their original bottle.  Medication: Hydrocodone /APAP Pill/Patch Count: 11 of 90 pills/patches remain Pill/Patch Appearance: Markings consistent with prescribed medication Bottle Appearance: Standard pharmacy container. Clearly labeled. Filled Date: 05 / 13 / 2025 Last Medication intake:  TodaySafety precautions to be maintained throughout the outpatient stay will include: orient to surroundings, keep bed in low position, maintain call bell within reach at all times, provide assistance with transfer out of bed and ambulation.   UDS:  No results found for: "SUMMARY"  No results found for: "CBDTHCR" No results found for: "D8THCCBX" No results found for: "D9THCCBX"  ROS  Constitutional: Denies any fever  or chills Gastrointestinal: No reported hemesis, hematochezia, vomiting, or acute GI distress Musculoskeletal: Neck pain Neurological: No reported episodes of acute onset apraxia, aphasia, dysarthria, agnosia, amnesia, paralysis, loss of coordination, or loss of consciousness  Medication Review  FLUoxetine , HYDROcodone -acetaminophen , L-Methylfolate, Multiple Vitamins-Minerals, RABEprazole, Semaglutide-Weight Management, benztropine , ergocalciferol , fexofenadine, fluticasone, hydrOXYzine , ibuprofen, linaclotide , metFORMIN , ondansetron , pantoprazole , prazosin , ranitidine, risperiDONE , and valACYclovir   History Review  Allergy: Tammy Young is allergic to bee venom, nsaids, amoxicillin, and augmentin [amoxicillin-pot clavulanate]. Drug: Tammy Young  reports no history of drug use. Alcohol:  reports no history of alcohol use. Tobacco:  reports that she has never smoked. She has been exposed to tobacco smoke. She has never used smokeless tobacco. Social: Tammy Young  reports that she has never smoked. She has been exposed to tobacco smoke. She has never used smokeless tobacco. She reports that she does not drink alcohol and does not use drugs. Medical:  has a past medical history of Anxiety and depression, Arthritis, Chronic constipation, Chronic cough, Chronic kidney disease (CKD), Collagen vascular disease (HCC), COPD (chronic obstructive pulmonary disease) (HCC), CTEPH (chronic thromboembolic pulmonary hypertension) (HCC), Fatty liver disease, nonalcoholic, GERD (gastroesophageal reflux disease), Headache, Hemorrhoids, Herpes simplex vulvovaginitis, Hiatal hernia, History of kidney stones, History of stomach ulcers, HLD (hyperlipidemia), Hypertension, Hypothyroidism, unspecified (10/23/2012), Liver disease, Lymphedema of right lower extremity, Morbid obesity (HCC), Multiple gastric ulcers, Multiple gastric ulcers, Obstructive sleep apnea syndrome (10/18/2017), Personal history of COVID-19 (12/2020), PONV  (postoperative nausea and vomiting), Pre-diabetes, Pulmonary embolism (HCC), Renal disorder, Seizures (HCC), Stress incontinence (female) (female), Stroke (HCC), Vitamin B 12 deficiency, and Vitamin D  deficiency. 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 Family: family history includes Alcohol abuse in her father; Bipolar disorder in her mother; COPD in her mother; Diabetes in her maternal grandmother and mother; Drug abuse in her mother; Kidney Stones in her father and mother; Migraines in her maternal grandmother; Prostate cancer in her maternal grandfather; Stroke in her maternal grandmother.  Laboratory Chemistry Profile   Renal Lab Results  Component Value Date   BUN 13 04/05/2021   CREATININE 0.68 04/05/2021   GFRAA >60 02/17/2020   GFRNONAA >60 04/05/2021    Hepatic Lab Results  Component Value Date   AST 56 (H) 04/05/2021   ALT 54 (H) 04/05/2021   ALBUMIN 3.7 04/05/2021   ALKPHOS 51 04/05/2021   LIPASE 28 10/08/2016    Electrolytes Lab Results  Component Value Date   NA 139 04/05/2021   K 4.4 04/05/2021   CL 111 04/05/2021   CALCIUM  9.1 04/05/2021   MG 2.3  04/05/2021    Bone No results found for: "VD25OH", "VD125OH2TOT", "ZO1096EA5", "WU9811BJ4", "25OHVITD1", "25OHVITD2", "25OHVITD3", "TESTOFREE", "TESTOSTERONE"  Inflammation (CRP: Acute Phase) (ESR: Chronic Phase) No results found for: "CRP", "ESRSEDRATE", "LATICACIDVEN"       Note: Above Lab results reviewed.  Recent Imaging Review  MM 3D SCREENING MAMMOGRAM BILATERAL BREAST CLINICAL DATA:  Screening.  EXAM: DIGITAL SCREENING BILATERAL  MAMMOGRAM WITH TOMOSYNTHESIS AND CAD  TECHNIQUE: Bilateral screening digital craniocaudal and mediolateral oblique mammograms were obtained. Bilateral screening digital breast tomosynthesis was performed. The images were evaluated with computer-aided detection.  COMPARISON:  Previous exam(s).  ACR Breast Density Category b: There are scattered areas of fibroglandular density.  FINDINGS: There are no findings suspicious for malignancy.  IMPRESSION: No mammographic evidence of malignancy. A result letter of this screening mammogram will be mailed directly to the patient.  RECOMMENDATION: Screening mammogram in one year. (Code:[id number]-B-01Y)  BI-RADS CATEGORY  1: Negative.  Electronically Signed   By: Sande Cromer M.D.   On: 11/12/2023 12:30 Note: Reviewed         Physical Exam  General appearance: Well nourished, well developed, and well hydrated. In no apparent acute distress Mental status: Alert, oriented x 3 (person, place, & time)       Respiratory: No evidence of acute respiratory distress Eyes: PERLA Vitals: BP 109/86 (Cuff Size: Normal)   Pulse 75   Temp (!) 96.6 F (35.9 C) (Temporal)   Resp 16   Ht 5\' 4"  (1.626 m)   Wt 176 lb (79.8 kg)   SpO2 100%   BMI 30.21 kg/m  BMI: Estimated body mass index is 30.21 kg/m as calculated from the following:   Height as of this encounter: 5\' 4"  (1.626 m).   Weight as of this encounter: 176 lb (79.8 kg). Ideal: Ideal body weight: 54.7 kg (120 lb 9.5 oz) Adjusted ideal body weight:  64.8 kg (142 lb 12.1 oz)  Assessment   Diagnosis Status  1. Chronic pain syndrome   2. Fibromyalgia   3. Chronic pain of both shoulders   4. Rotator cuff arthropathy of right shoulder   5. Disorder of right suprascapular nerve   6. Suprascapular nerve entrapment, left   7. S/P cervical spinal fusion   8. History of rotator cuff surgery (right)   9. Medication management    Controlled Controlled Controlled   Updated Problems: Problem  Medication Management  Suprascapular Nerve Entrapment, Left    Plan of Care  Problem-specific:  Assessment and Plan We will continue on current medication regimen.  Prescribing drug monitoring (PDMP) reviewed; findings consistent with the use of prescribed medication and no evidence of narcotic misuse or abuse.Routine UDS ordered today.  Schedule follow-up in 90 days for medication management.  No other new issues or problems reported to this visit.   Tammy Young has a current medication list which includes the following long-term medication(s): benztropine , fluoxetine , fluticasone, metformin , prazosin , risperidone , [DISCONTINUED] rabeprazole, pantoprazole , [DISCONTINUED] fexofenadine, and [DISCONTINUED] ranitidine.  Pharmacotherapy (Medications Ordered): Meds ordered this encounter  Medications   HYDROcodone -acetaminophen  (NORCO/VICODIN) 5-325 MG tablet    Sig: Take 1 tablet by mouth 3 (three) times daily as needed for severe pain (pain score 7-10). Must last 30 days    Dispense:  90 tablet    Refill:  0    Chronic Pain: STOP Act (Not applicable) Fill 1 day early if closed on refill date. Avoid benzodiazepines within 8 hours of opioids   HYDROcodone -acetaminophen  (NORCO/VICODIN) 5-325 MG tablet    Sig: Take 1 tablet by mouth 3 (three) times daily as needed for severe pain (pain score 7-10). Must last 30 days    Dispense:  90 tablet    Refill:  0    Chronic Pain: STOP Act (Not applicable) Fill 1 day early if closed on refill date.  Avoid benzodiazepines within 8 hours of opioids   HYDROcodone -acetaminophen  (NORCO/VICODIN)  5-325 MG tablet    Sig: Take 1 tablet by mouth 3 (three) times daily as needed for severe pain (pain score 7-10). Must last 30 days    Dispense:  90 tablet    Refill:  0    Chronic Pain: STOP Act (Not applicable) Fill 1 day early if closed on refill date. Avoid benzodiazepines within 8 hours of opioids   Orders:  Orders Placed This Encounter  Procedures   ToxASSURE Select 13 (MW), Urine    Volume: 30 ml(s). Minimum 3 ml of urine is needed. Document temperature of fresh sample. Indications: Long term (current) use of opiate analgesic (Z61.096)    Release to patient:   Immediate        Return in about 3 months (around 03/31/2024) for (F2F), (MM), Marthe Slain NP.    Recent Visits Date Type Provider Dept  12/03/23 Office Visit Domonique Cothran K, NP [facility]  11/18/23 Office Visit Shamel Galyean K, NP [facility]  11/04/23 Procedure visit Cephus Collin, MD [facility]  Showing recent visits within past 90 days and meeting all other requirements Today's Visits Date Type Provider Dept  12/30/23 Office Visit Devontae Casasola K, NP [facility]  Showing today's visits and meeting all other requirements Future Appointments No visits were found meeting these conditions. Showing future appointments within next 90 days and meeting all other requirements  I discussed the assessment and treatment plan with the patient. The patient was provided an opportunity to ask questions and all were answered. The patient agreed with the plan and demonstrated an understanding of the instructions.  Patient advised to call back or seek an in-person evaluation if the symptoms or condition worsens.  Duration of encounter: 30 minutes.  Total time on encounter, as per AMA guidelines included both the face-to-face and non-face-to-face time personally spent by the physician and/or other  qualified health care professional(s) on the day of the encounter (includes time in activities that require the physician or other qualified health care professional and does not include time in activities normally performed by clinical staff). Physician's time may include the following activities when performed: Preparing to see the patient (e.g., pre-charting review of records, searching for previously ordered imaging, lab work, and nerve conduction tests) Review of prior analgesic pharmacotherapies. Reviewing PMP Interpreting ordered tests (e.g., lab work, imaging, nerve conduction tests) Performing post-procedure evaluations, including interpretation of diagnostic procedures Obtaining and/or reviewing separately obtained history Performing a medically appropriate examination and/or evaluation Counseling and educating the patient/family/caregiver Ordering medications, tests, or procedures Referring and communicating with other health care professionals (when not separately reported) Documenting clinical information in the electronic or other health record Independently interpreting results (not separately reported) and communicating results to the patient/ family/caregiver Care coordination (not separately reported)  Note by: Majd Tissue K Randie Bloodgood, NP (TTS and AI technology used. I apologize for any typographical errors that were not detected and corrected.) Date: 12/30/2023; Time: 9:35 AM

## 2023-12-30 NOTE — Progress Notes (Signed)
 Nursing Pain Medication Assessment:  Safety precautions to be maintained throughout the outpatient stay will include: orient to surroundings, keep bed in low position, maintain call bell within reach at all times, provide assistance with transfer out of bed and ambulation.  Medication Inspection Compliance: Pill count conducted under aseptic conditions, in front of the patient. Neither the pills nor the bottle was removed from the patient's sight at any time. Once count was completed pills were immediately returned to the patient in their original bottle.  Medication: Hydrocodone /APAP Pill/Patch Count: 11 of 90 pills/patches remain Pill/Patch Appearance: Markings consistent with prescribed medication Bottle Appearance: Standard pharmacy container. Clearly labeled. Filled Date: 05 / 13 / 2025 Last Medication intake:  TodaySafety precautions to be maintained throughout the outpatient stay will include: orient to surroundings, keep bed in low position, maintain call bell within reach at all times, provide assistance with transfer out of bed and ambulation.

## 2024-01-02 ENCOUNTER — Encounter: Admitting: Nurse Practitioner

## 2024-01-02 LAB — TOXASSURE SELECT 13 (MW), URINE

## 2024-01-06 ENCOUNTER — Ambulatory Visit (INDEPENDENT_AMBULATORY_CARE_PROVIDER_SITE_OTHER): Admitting: Professional Counselor

## 2024-01-06 DIAGNOSIS — F3161 Bipolar disorder, current episode mixed, mild: Secondary | ICD-10-CM | POA: Diagnosis not present

## 2024-01-06 DIAGNOSIS — F4310 Post-traumatic stress disorder, unspecified: Secondary | ICD-10-CM

## 2024-01-06 NOTE — Progress Notes (Signed)
  THERAPIST PROGRESS NOTE  Session Time: 9:03 AM - 9:43 AM  Participation Level: Active  Behavioral Response: Well Groomed, Alert, Anxious  Type of Therapy: Individual Therapy  Treatment Goals addressed: Active OP Depression  LTG: Not feeling that myself and everybody else around me would be better off if I was dead.     Start:  2023-12-16    Expected End:  12/07/24     STG: To try to get out of the house more. To improve socialization AEB reducing depression sxs, increasing network and engaging in activities once a week for the next 12 weeks.    STG: I would say not to stress about things I can't change. To improve stress management AEB utilizing coping mechanisms 3 out of 7 days for the next 90 days.    ProgressTowards Goals: Progressing  Interventions: CBT, Motivational Interviewing, and Supportive  Summary: Tammy Young is a 58 y.o. female who presents with a history of bipolar disorder and PTSD. She appeared alert and oriented x5. She was unsure what to discuss in today's session. Deloise discussed things at home and expressed feeling like her son controls her. She engaged in writing exercise. She was able to identify things within and outside of her control. She was receptive to input from Conservation officer, nature and noted she struggles with controlling some of the things she actually can control.   Therapist Response: Conducted session with [name]. Began session with check-in/update since previous session. Utilized empathetic and reflective listening. Used open-ended questions to facilitate discussion. Engaged Tiajah in writing exercise - donut/circles of control. Assisted with identifying things within and outside of her control. Encouraged her to focus on the donut hole/things within her control. Confirmed next appointment and concluded session.   Suicidal/Homicidal: No  Plan: Return again in 2 weeks.  Diagnosis: Bipolar disorder, current episode mixed, mild (HCC)  [F31.61]  PTSD (post-traumatic stress disorder)  Collaboration of Care: Medication Management AEB chart review  Patient/Guardian was advised Release of Information must be obtained prior to any record release in order to collaborate their care with an outside provider. Patient/Guardian was advised if they have not already done so to contact the registration department to sign all necessary forms in order for us  to release information regarding their care.   Consent: Patient/Guardian gives verbal consent for treatment and assignment of benefits for services provided during this visit. Patient/Guardian expressed understanding and agreed to proceed.   Len Quale, [facility] 01/06/2024

## 2024-01-09 ENCOUNTER — Ambulatory Visit: Admitting: Psychiatry

## 2024-01-13 ENCOUNTER — Other Ambulatory Visit: Payer: Self-pay | Admitting: Psychiatry

## 2024-01-15 ENCOUNTER — Other Ambulatory Visit: Payer: Self-pay | Admitting: Psychiatry
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
## 2024-01-20 ENCOUNTER — Ambulatory Visit (INDEPENDENT_AMBULATORY_CARE_PROVIDER_SITE_OTHER): Admitting: Psychiatry

## 2024-01-20 ENCOUNTER — Other Ambulatory Visit: Payer: Self-pay

## 2024-01-20 ENCOUNTER — Encounter: Payer: Self-pay | Admitting: Psychiatry

## 2024-01-20 VITALS — BP 109/73 | HR 86 | Temp 97.2°F | Ht 64.0 in | Wt 172.6 lb

## 2024-01-20 DIAGNOSIS — F4310 Post-traumatic stress disorder, unspecified: Secondary | ICD-10-CM | POA: Diagnosis not present

## 2024-01-20 DIAGNOSIS — G2571 Drug induced akathisia: Secondary | ICD-10-CM

## 2024-01-20 DIAGNOSIS — F3161 Bipolar disorder, current episode mixed, mild: Secondary | ICD-10-CM

## 2024-01-20 MED ORDER — BENZTROPINE MESYLATE 1 MG PO TABS: 1.0000 mg | ORAL_TABLET | Freq: Every day | ORAL | 1 refills | Status: DC

## 2024-01-21 ENCOUNTER — Ambulatory Visit (INDEPENDENT_AMBULATORY_CARE_PROVIDER_SITE_OTHER): Admitting: Professional Counselor

## 2024-01-21 DIAGNOSIS — F4310 Post-traumatic stress disorder, unspecified: Secondary | ICD-10-CM

## 2024-01-21 DIAGNOSIS — F3161 Bipolar disorder, current episode mixed, mild: Secondary | ICD-10-CM | POA: Diagnosis not present

## 2024-01-21 NOTE — Progress Notes (Signed)
  THERAPIST PROGRESS NOTE  Session Time: 11:00 AM - 11:53 AM   Participation Level: Active  Behavioral Response: Well Groomed, Alert, Anxious and Dysphoric  Type of Therapy: Individual Therapy  Treatment Goals addressed: Active OP Depression  LTG: Not feeling that myself and everybody else around me would be better off if I was dead.                 Start:  2023/12/27    Expected End:  12/07/24      STG: To try to get out of the house more. To improve socialization AEB reducing depression sxs, increasing network and engaging in activities once a week for the next 12 weeks.     STG: I would say not to stress about things I can't change. To improve stress management AEB utilizing coping mechanisms 3 out of 7 days for the next 90 days.    ProgressTowards Goals: Progressing  Interventions: CBT, Motivational Interviewing, and Supportive  Summary: Tammy Young is a 58 y.o. female who presents with a history of bipolar disorder and PTSD. She appeared alert and oriented x5. She stated things are basically the same. Tammy Young reported the main issues exist with her son when her grandson is there and their differences in parenting. Tammy Young identified thoughts around situation. She appeared receptive to [facility] model. She actively listened to [facility] MAN skill but worries it won't be helpful with her son.   Therapist Response: Conducted session with [name]. Began session with check-in/update since previous session. Utilized empathetic and reflective listening. Used open-ended questions to facilitate discussion and summarized Tammy Young's thoughts/feelings. Explained ABC model and encouraged Tammy Young to put her thoughts on trial. Explained and modeled DEAR MAN skill to support effective communication and objective effectiveness with her son. Scheduled additional appointment and concluded session.   Suicidal/Homicidal: No  Plan: Return again in 4 weeks.  Diagnosis: Bipolar disorder, current episode  mixed, mild (HCC) [F31.61]  PTSD (post-traumatic stress disorder)  Collaboration of Care: Medication Management AEB chart review  Patient/Guardian was advised Release of Information must be obtained prior to any record release in order to collaborate their care with an outside provider. Patient/Guardian was advised if they have not already done so to contact the registration department to sign all necessary forms in order for us  to release information regarding their care.   Consent: Patient/Guardian gives verbal consent for treatment and assignment of benefits for services provided during this visit. Patient/Guardian expressed understanding and agreed to proceed.   Tammy Young, [facility] 01/21/2024

## 2024-02-04 ENCOUNTER — Ambulatory Visit: Admitting: Psychiatry

## 2024-02-18 ENCOUNTER — Other Ambulatory Visit: Payer: Self-pay | Admitting: Psychiatry
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
## 2024-02-24 ENCOUNTER — Encounter: Payer: Self-pay | Admitting: Psychiatry

## 2024-02-24 ENCOUNTER — Ambulatory Visit (INDEPENDENT_AMBULATORY_CARE_PROVIDER_SITE_OTHER): Admitting: Psychiatry

## 2024-02-24 ENCOUNTER — Other Ambulatory Visit: Payer: Self-pay

## 2024-02-24 VITALS — BP 128/83 | HR 75 | Temp 97.6°F | Ht 64.0 in | Wt 165.6 lb

## 2024-02-24 DIAGNOSIS — F3161 Bipolar disorder, current episode mixed, mild: Secondary | ICD-10-CM | POA: Diagnosis not present

## 2024-02-24 DIAGNOSIS — G2571 Drug induced akathisia: Secondary | ICD-10-CM

## 2024-02-24 DIAGNOSIS — F4310 Post-traumatic stress disorder, unspecified: Secondary | ICD-10-CM | POA: Diagnosis not present

## 2024-02-24 MED ORDER — FLUOXETINE HCL 20 MG PO CAPS
20.0000 mg | ORAL_CAPSULE | Freq: Every day | ORAL | 0 refills | Status: DC
Start: 2024-03-19 — End: 2024-05-27

## 2024-02-24 MED ORDER — MIRTAZAPINE 15 MG PO TABS
ORAL_TABLET | ORAL | 1 refills | Status: DC
Start: 2024-02-24 — End: 2024-06-04

## 2024-02-24 MED ORDER — RISPERIDONE 2 MG PO TABS: 2.0000 mg | ORAL_TABLET | Freq: Every day | ORAL | 0 refills | Status: DC

## 2024-02-24 NOTE — Patient Instructions (Addendum)
 Continue risperidone  2 mg  Continue metformin  500 mg at night  Continue benztropine  1 mg at night  Continue prazosin  2 mg  Continue fluoxetine  20 mg daily  Start mirtazapine  7.5 mg at night for one week, then 15 mg at night  Continue hydroxyzine  50 mg in AM- 25 mg in PM a day as needed for anxiety  Next appointment: 9/16 at 1 pm

## 2024-02-26 ENCOUNTER — Ambulatory Visit (INDEPENDENT_AMBULATORY_CARE_PROVIDER_SITE_OTHER): Admitting: Professional Counselor

## 2024-02-26 DIAGNOSIS — F411 Generalized anxiety disorder: Secondary | ICD-10-CM

## 2024-02-26 DIAGNOSIS — F4310 Post-traumatic stress disorder, unspecified: Secondary | ICD-10-CM | POA: Diagnosis not present

## 2024-02-26 DIAGNOSIS — F3161 Bipolar disorder, current episode mixed, mild: Secondary | ICD-10-CM | POA: Diagnosis not present

## 2024-02-26 NOTE — Progress Notes (Signed)
  THERAPIST PROGRESS NOTE  Session Time: 9:00 AM - 9:53 AM   Participation Level: Active  Behavioral Response: Well Groomed, Alert, Depressed  Type of Therapy: Individual Therapy  Treatment Goals addressed:  Active OP Depression  LTG: Not feeling that myself and everybody else around me would be better off if I was dead.                 Start:  2023-12-25    Expected End:  12/07/24      STG: To try to get out of the house more. To improve socialization AEB reducing depression sxs, increasing network and engaging in activities once a week for the next 12 weeks.     STG: I would say not to stress about things I can't change. To improve stress management AEB utilizing coping mechanisms 3 out of 7 days for the next 90 days.   ProgressTowards Goals: Progressing  Interventions: Motivational Interviewing, Solution Focused, and Supportive  Summary: Tammy Young is a 58 y.o. female who presents with a history of bipolar disorder and PTSD. She appeared somber but oriented x5. She shared how a recent trip with her son and granddaughter went horribly. She also shared concerns about her grandson. [name] expressed feeling hopeless and noting her dog and grandchildren are her only hope. She shared feeling stuck in her current situation and unsure about what would help. She may look into other housing options as she feels living separately from her son may be helpful.   Therapist Response: Conducted session with [name]. Began session with check-in/update since previous session. Utilized empathetic and reflective listening. Used open-ended questions to facilitate discussion and summarized thoughts/feelings. Explored possible solutions to changing current situation, including housing. Scheduled additional appointment and concluded session.   Suicidal/Homicidal: Yes, without intent/plan, passive thoughts of not wanting to be here, lack of purpose, denies any thoughts of hurting self  Plan: Return  again in 5 weeks.  Diagnosis: Bipolar disorder, current episode mixed, mild (HCC) [F31.61]  PTSD (post-traumatic stress disorder)  Anxiety state [F41.1]  Collaboration of Care: Medication Management AEB chart review  Patient/Guardian was advised Release of Information must be obtained prior to any record release in order to collaborate their care with an outside provider. Patient/Guardian was advised if they have not already done so to contact the registration department to sign all necessary forms in order for us  to release information regarding their care.   Consent: Patient/Guardian gives verbal consent for treatment and assignment of benefits for services provided during this visit. Patient/Guardian expressed understanding and agreed to proceed.   [name], [facility] 02/26/2024

## 2024-03-03 ENCOUNTER — Other Ambulatory Visit: Payer: Self-pay | Admitting: Medical Genetics

## 2024-03-10 ENCOUNTER — Other Ambulatory Visit
Admission: RE | Admit: 2024-03-10 | Discharge: 2024-03-10 | Disposition: A | Discharge status: Home / Self Care | Payer: Self-pay | Source: Ambulatory Visit | Attending: Medical Genetics | Admitting: Medical Genetics

## 2024-03-13 ENCOUNTER — Other Ambulatory Visit: Payer: Self-pay | Admitting: Psychiatry

## 2024-03-20 ENCOUNTER — Other Ambulatory Visit: Payer: Self-pay | Admitting: Psychiatry

## 2024-03-20 LAB — GENECONNECT MOLECULAR SCREEN: Genetic Analysis Overall Interpretation: NEGATIVE

## 2024-03-27 ENCOUNTER — Other Ambulatory Visit: Payer: Self-pay | Admitting: Orthopedic Surgery

## 2024-03-31 ENCOUNTER — Ambulatory Visit (INDEPENDENT_AMBULATORY_CARE_PROVIDER_SITE_OTHER): Admitting: Professional Counselor

## 2024-03-31 ENCOUNTER — Encounter: Payer: Self-pay | Admitting: Nurse Practitioner

## 2024-03-31 ENCOUNTER — Ambulatory Visit: Attending: Nurse Practitioner | Admitting: Nurse Practitioner

## 2024-03-31 VITALS — BP 130/87 | HR 71 | Temp 97.1°F | Resp 16 | Ht 64.0 in | Wt 157.0 lb

## 2024-03-31 DIAGNOSIS — M797 Fibromyalgia: Secondary | ICD-10-CM | POA: Insufficient documentation

## 2024-03-31 DIAGNOSIS — F3161 Bipolar disorder, current episode mixed, mild: Secondary | ICD-10-CM | POA: Diagnosis not present

## 2024-03-31 DIAGNOSIS — G8929 Other chronic pain: Secondary | ICD-10-CM | POA: Diagnosis present

## 2024-03-31 DIAGNOSIS — M25512 Pain in left shoulder: Secondary | ICD-10-CM | POA: Diagnosis not present

## 2024-03-31 DIAGNOSIS — Z981 Arthrodesis status: Secondary | ICD-10-CM | POA: Diagnosis present

## 2024-03-31 DIAGNOSIS — M12811 Other specific arthropathies, not elsewhere classified, right shoulder: Secondary | ICD-10-CM | POA: Insufficient documentation

## 2024-03-31 DIAGNOSIS — G894 Chronic pain syndrome: Secondary | ICD-10-CM | POA: Diagnosis not present

## 2024-03-31 DIAGNOSIS — F4310 Post-traumatic stress disorder, unspecified: Secondary | ICD-10-CM | POA: Diagnosis not present

## 2024-03-31 DIAGNOSIS — Z79899 Other long term (current) drug therapy: Secondary | ICD-10-CM | POA: Insufficient documentation

## 2024-03-31 DIAGNOSIS — M25511 Pain in right shoulder: Secondary | ICD-10-CM | POA: Diagnosis not present

## 2024-03-31 MED ORDER — HYDROCODONE-ACETAMINOPHEN 5-325 MG PO TABS: 1.0000 | ORAL_TABLET | Freq: Three times a day (TID) | ORAL | 0 refills | Status: AC | PRN

## 2024-03-31 MED ORDER — HYDROCODONE-ACETAMINOPHEN 5-325 MG PO TABS: 1.0000 | ORAL_TABLET | Freq: Three times a day (TID) | ORAL | 0 refills | Status: DC | PRN

## 2024-03-31 NOTE — Progress Notes (Signed)
 PROVIDER NOTE: Interpretation of information contained herein should be left to medically-trained personnel. Specific patient instructions are provided elsewhere under Patient Instructions section of medical record. This document was created in part using AI and STT-dictation technology, any transcriptional errors that may result from this process are unintentional.  Patient: Tammy Young  Service: E/M   PCP: Steva Clotilda DEL, NP  DOB: August 23, 1965  DOS: 03/31/2024  Provider: Emmy MARLA Blanch, NP  MRN: [id number]  Delivery: Face-to-face  Specialty: Interventional Pain Management  Type: Established Patient  Setting: Ambulatory outpatient facility  Specialty designation: 09  Referring Prov.: Steva Clotilda DEL, NP  Location: Outpatient office facility       History of present illness (HPI) [name], a 58 y.o. year old female, is here today because of her Chronic pain syndrome [G89.4]. [name] primary complain today is Shoulder Pain (Bilateral, left is worse.  Scheduled for surgery 04/14/24 rotator cuff repair on the left )  Pertinent problems: [name] has a Chronic pain syndrome; Fibromyalgia; Chronic pain of both shoulders; History of rotator cuff surgery (Right); Rotator cuff arthropathy of right shoulder; Suprascapular nerve entrapment (Left); and S/P Cervical spinal fusion on their pertinent problem list   Pain Assessment: Severity of Chronic pain is reported as a 6 /10. Location: Shoulder Left, Right/down both arms to the fingers. Onset: More than a month ago. Quality: Tingling, Discomfort, Constant. Timing: Constant. Modifying factor(s): nothing currently.  taking hydrocodone  - apap 5 - 325 mg and that seems to help a little bit but does not take the pain away. Vitals:  height is 5' 4 (1.626 m) and weight is 157 lb (71.2 kg). Her temporal temperature is 97.1 F (36.2 C) (abnormal). Her blood pressure is 130/87 and her pulse is 71. Her respiration is 16 and oxygen saturation is 100%.   BMI: Estimated body mass index is 26.95 kg/m as calculated from the following:   Height as of this encounter: 5' 4 (1.626 m).   Weight as of this encounter: 157 lb (71.2 kg).  Last encounter: 12/30/2023. Last procedure: Visit date not found.  Reason for encounter: medication management. No change in medical history since last visit.  Patient's pain is at baseline.  Patient continues multimodal pain regimen as prescribed.  States that it provides pain relief and improvement in functional status.  The patient has an upcoming left shoulder rotator cuff surgery scheduled for the end of the September 2025.  Pharmacotherapy Assessment   Analgesic:Hydrocodone -acetaminophen  (Norco) 5-325 mg tablet every 8 hours as needed for pain. MME=15 [place] PMP: PDMP reviewed during this encounter.       Pharmacotherapy: No side-effects or adverse reactions reported. Compliance: No problems identified. Effectiveness: Clinically acceptable.  Jakie Chrissie MATSU, RN  03/31/2024  8:47 AM  Sign when Signing Visit Nursing Pain Medication Assessment:  Safety precautions to be maintained throughout the outpatient stay will include: orient to surroundings, keep bed in low position, maintain call bell within reach at all times, provide assistance with transfer out of bed and ambulation.  Medication Inspection Compliance: Pill count conducted under aseptic conditions, in front of the patient. Neither the pills nor the bottle was removed from the patient's sight at any time. Once count was completed pills were immediately returned to the patient in their original bottle.  Medication: Hydrocodone /APAP Pill/Patch Count: 67 of 90 pills/patches remain Pill/Patch Appearance: Markings consistent with prescribed medication Bottle Appearance: Standard pharmacy container. Clearly labeled. Filled Date: 08 / 23 / 2025 Last Medication intake:  Today    UDS:  Summary  Date Value Ref Range Status  12/30/2023 FINAL  Final     Comment:    ==================================================================== [employer] (MW) ==================================================================== Test                             Result       Flag       Units  Drug Present and Declared for Prescription Verification   Hydrocodone                     1164         EXPECTED   ng/mg creat   Dihydrocodeine                 168          EXPECTED   ng/mg creat   Norhydrocodone                 1245         EXPECTED   ng/mg creat    Sources of hydrocodone  include scheduled prescription medications.    Dihydrocodeine and norhydrocodone are expected metabolites of    hydrocodone . Dihydrocodeine is also available as a scheduled    prescription medication.  Drug Present not Declared for Prescription Verification   7-aminoclonazepam              36           UNEXPECTED ng/mg creat    7-aminoclonazepam is an expected metabolite of clonazepam. Source of    clonazepam is a scheduled prescription medication.  ==================================================================== Test                      Result    Flag   Units      Ref Range   Creatinine              131              mg/dL      >=79 ==================================================================== Declared Medications:  The flagging and interpretation on this report are based on the  following declared medications.  Unexpected results may arise from  inaccuracies in the declared medications.   **Note: The testing scope of this panel includes these medications:   Hydrocodone  (Norco)   **Note: The testing scope of this panel does not include the  following reported medications:   Acetaminophen  (Norco)  Benztropine  (Cogentin )  Fexofenadine (Allegra)  Fluoxetine  (Prozac )  Fluticasone (Flonase)  Hydroxyzine  (Atarax )  Ibuprofen (Advil)  Linaclotide  (Linzess )  Metformin  (Glucophage )  Multivitamin  Ondansetron  (Zofran )  Prazosin  (Minipress )   Rabeprazole (Aciphex)  Ranitidine (Zantac)  Risperidone  (Risperdal )  Semaglutide [facility])  Supplement  Valacyclovir  (Valtrex )  Vitamin D2 ==================================================================== For clinical consultation, please call [phone]. ====================================================================     No results found for: CBDTHCR No results found for: D8THCCBX No results found for: D9THCCBX  ROS  Constitutional: Denies any fever or chills Gastrointestinal: No reported hemesis, hematochezia, vomiting, or acute GI distress Musculoskeletal: Denies any acute onset joint swelling, redness, loss of ROM, or weakness Neurological: No reported episodes of acute onset apraxia, aphasia, dysarthria, agnosia, amnesia, paralysis, loss of coordination, or loss of consciousness  Medication Review  FLUoxetine , HYDROcodone -acetaminophen , Multiple Vitamins-Minerals, RABEprazole, benztropine , ergocalciferol , fexofenadine, fluticasone, ibuprofen, linaclotide , metFORMIN , mirtazapine , ondansetron , prazosin , ranitidine, risperiDONE , semaglutide-weight management, and valACYclovir   History Review  Allergy: [name] is allergic to bee venom, nsaids, amoxicillin, and augmentin [  amoxicillin-pot clavulanate]. Drug: [name]  reports no history of drug use. Alcohol:  reports no history of alcohol use. Tobacco:  reports that she has never smoked. She has been exposed to tobacco smoke. She has never used smokeless tobacco. Social: [name]  reports that she has never smoked. She has been exposed to tobacco smoke. She has never used smokeless tobacco. She reports that she does not drink alcohol and does not use drugs. Medical:  has a past medical history of Anxiety and depression, Arthritis, Chronic constipation, Chronic cough, Chronic kidney disease (CKD), Collagen vascular disease (HCC), COPD (chronic obstructive pulmonary disease) (HCC), CTEPH (chronic thromboembolic  pulmonary hypertension) (HCC), Fatty liver disease, nonalcoholic, GERD (gastroesophageal reflux disease), Headache, Hemorrhoids, Herpes simplex vulvovaginitis, Hiatal hernia, History of kidney stones, History of stomach ulcers, HLD (hyperlipidemia), Hypertension, Hypothyroidism, unspecified (10/23/2012), Liver disease, Lymphedema of right lower extremity, Morbid obesity (HCC), Multiple gastric ulcers, Multiple gastric ulcers, Obstructive sleep apnea syndrome (10/18/2017), Personal history of COVID-19 (12/2020), PONV (postoperative nausea and vomiting), Pre-diabetes, Pulmonary embolism (HCC), Renal disorder, Seizures (HCC), Stress incontinence (female) (female), Stroke (HCC), Vitamin B 12 deficiency, and Vitamin D  deficiency. Surgical: 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 Family: family history includes Alcohol abuse in her father; Bipolar disorder in her mother; COPD in her mother; Diabetes in her maternal grandmother and mother; Drug abuse in her mother; Kidney Stones in her father and mother; Migraines in her maternal grandmother; Prostate cancer in her maternal grandfather; Stroke in her  maternal grandmother.  Laboratory Chemistry Profile   Renal Lab Results  Component Value Date   BUN 13 04/05/2021   CREATININE 0.68 04/05/2021   GFRAA >60 02/17/2020   GFRNONAA >60 04/05/2021    Hepatic Lab Results  Component Value Date   AST 56 (H) 04/05/2021   ALT 54 (H) 04/05/2021   ALBUMIN 3.7 04/05/2021   ALKPHOS 51 04/05/2021   LIPASE 28 10/08/2016    Electrolytes Lab Results  Component Value Date   NA 139 04/05/2021   K 4.4 04/05/2021   CL 111 04/05/2021   CALCIUM  9.1 04/05/2021   MG 2.3 04/05/2021    Bone No results found for: VD25OH, VD125OH2TOT, CI6874NY7, CI7874NY7, 25OHVITD1, 25OHVITD2, 25OHVITD3, TESTOFREE, TESTOSTERONE  Inflammation (CRP: Acute Phase) (ESR: Chronic Phase) No results found for: CRP, ESRSEDRATE, LATICACIDVEN       Note: Above Lab results reviewed.  Recent Imaging Review  MM 3D SCREENING MAMMOGRAM BILATERAL BREAST CLINICAL DATA:  Screening.  EXAM: DIGITAL SCREENING BILATERAL MAMMOGRAM WITH TOMOSYNTHESIS AND CAD  TECHNIQUE: Bilateral screening digital craniocaudal and mediolateral oblique mammograms were obtained. Bilateral screening digital breast tomosynthesis was performed. The images were evaluated with computer-aided detection.  COMPARISON:  Previous exam(s).  ACR Breast Density Category b: There are scattered areas of fibroglandular density.  FINDINGS: There are no findings suspicious for malignancy.  IMPRESSION: No mammographic evidence of malignancy. A result letter of this screening mammogram will be mailed directly to the patient.  RECOMMENDATION: Screening mammogram in one  year. (Code:[id number]-B-01Y)  BI-RADS CATEGORY  1: Negative.  Electronically Signed   By: Dirk Arrant M.D.   On: 11/12/2023 12:30 Note: Reviewed        Physical Exam  Vitals: BP 130/87 (BP Location: Right Arm, Patient Position: Sitting, Cuff Size: Normal)   Pulse 71   Temp (!) 97.1 F (36.2 C) (Temporal)   Resp 16    Ht 5' 4 (1.626 m)   Wt 157 lb (71.2 kg)   SpO2 100%   BMI 26.95 kg/m  BMI: Estimated body mass index is 26.95 kg/m as calculated from the following:   Height as of this encounter: 5' 4 (1.626 m).   Weight as of this encounter: 157 lb (71.2 kg). Ideal: Ideal body weight: 54.7 kg (120 lb 9.5 oz) Adjusted ideal body weight: 61.3 kg (135 lb 2.5 oz) General appearance: Well nourished, well developed, and well hydrated. In no apparent acute distress Mental status: Alert, oriented x 3 (person, place, & time)       Respiratory: No evidence of acute respiratory distress Eyes: PERLA   Assessment   Diagnosis Status  1. Chronic pain syndrome   2. Fibromyalgia   3. Chronic pain of both shoulders   4. Rotator cuff arthropathy of right shoulder   5. S/P cervical spinal fusion   6. Medication management   7. Chronic right shoulder pain    Controlled Controlled Controlled   Updated Problems: No problems updated.  Plan of Care  Problem-specific:  Assessment and Plan  We will continue on current medication regimen.  Prescribing drug monitoring (PDMP) reviewed; findings consistent with the use of prescribed medication and no evidence of narcotic misuse or abuse.  Urine drug screening (UDS) up-to-date.  Schedule follow-up in 60 days for medication management.   [name] has a current medication list which includes the following long-term medication(s): benztropine , fluoxetine , fluticasone, metformin , mirtazapine , prazosin , risperidone , [DISCONTINUED] fexofenadine, [DISCONTINUED] rabeprazole, and [DISCONTINUED] ranitidine.  Pharmacotherapy (Medications Ordered): Meds ordered this encounter  Medications   HYDROcodone -acetaminophen  (NORCO/VICODIN) 5-325 MG tablet    Sig: Take 1 tablet by mouth 3 (three) times daily as needed for severe pain (pain score 7-10). Must last 30 days    Dispense:  90 tablet    Refill:  0    Chronic Pain: STOP Act (Not applicable) Fill 1 day early if  closed on refill date. Avoid benzodiazepines within 8 hours of opioids   HYDROcodone -acetaminophen  (NORCO/VICODIN) 5-325 MG tablet    Sig: Take 1 tablet by mouth 3 (three) times daily as needed for severe pain (pain score 7-10). Must last 30 days    Dispense:  90 tablet    Refill:  0    Chronic Pain: STOP Act (Not applicable) Fill 1 day early if closed on refill date. Avoid benzodiazepines within 8 hours of opioids   Orders:  No orders of the defined types were placed in this encounter.       Return in about 2 months (around 05/31/2024) for (F2F), (MM), Emmy Blanch NP.    Recent Visits No visits were found meeting these conditions. Showing recent visits within past 90 days and meeting all other requirements Today's Visits Date Type Provider Dept  03/31/24 Office Visit Toryn Dewalt K, NP [facility]  Showing today's visits and meeting all other requirements Future Appointments Date Type Provider Dept  05/25/24 Appointment Malani Lees K, NP [facility]  Showing future appointments within next 90 days and meeting all other requirements  I discussed the assessment and treatment plan with the patient. The patient was provided an opportunity to ask questions and all were answered. The patient agreed with the plan and demonstrated an understanding of the instructions.  Patient advised to call back or seek an in-person evaluation if the symptoms or condition worsens.  Duration of encounter: 30 minutes.  Total time on encounter, as per AMA guidelines included both the face-to-face and non-face-to-face time personally spent by the physician and/or other qualified health care professional(s) on the day of the encounter (includes time in activities that require the physician or other qualified health care professional and does not include time in activities normally performed by clinical staff). Physician's time may include the following activities when performed: Preparing  to see the patient (e.g., pre-charting review of records, searching for previously ordered imaging, lab work, and nerve conduction tests) Review of prior analgesic pharmacotherapies. Reviewing PMP Interpreting ordered tests (e.g., lab work, imaging, nerve conduction tests) Performing post-procedure evaluations, including interpretation of diagnostic procedures Obtaining and/or reviewing separately obtained history Performing a medically appropriate examination and/or evaluation Counseling and educating the patient/family/caregiver Ordering medications, tests, or procedures Referring and communicating with other health care professionals (when not separately reported) Documenting clinical information in the electronic or other health record Independently interpreting results (not separately reported) and communicating results to the patient/ family/caregiver Care coordination (not separately reported)  Note by: Jahel Wavra K Alaster Asfaw, NP (TTS and AI technology used. I apologize for any typographical errors that were not detected and corrected.) Date: 03/31/2024; Time: 10:53 AM

## 2024-03-31 NOTE — Progress Notes (Signed)
  THERAPIST PROGRESS NOTE  Session Time: 9:21 AM - 9:58 AM   Participation Level: Active  Behavioral Response: Well Groomed, Alert, Dysphoric  Type of Therapy: Individual Therapy  Treatment Goals addressed:  Active OP Depression  LTG: Not feeling that myself and everybody else around me would be better off if I was dead.                 Start:  12/27/23    Expected End:  12/07/24      STG: To try to get out of the house more. To improve socialization AEB reducing depression sxs, increasing network and engaging in activities once a week for the next 12 weeks.     STG: I would say not to stress about things I can't change. To improve stress management AEB utilizing coping mechanisms 3 out of 7 days for the next 90 days.    ProgressTowards Goals: Progressing  Interventions: Motivational Interviewing and Supportive  Summary: Tammy Young is a 58 y.o. female who presents with a history of anxiety, depression, bipolar disorder, and trauma. She appeared alert and oriented x5. She was running late from her pain management appointment. [name] reported she is scheduled for surgery on her rotator cuff later this month. She expressed concerns about help and explored possible solutions to this. She agreed to reach out to her doctor now rather than waiting until her next appointment, to see if they can request home health aide. [name] reported things are basically the same at home. She struggles to mention concerns to her son for fear of his backlash. She would rather maintain peace in the home since moving out is not really an option at this time.   Therapist Response: Conducted session with . Began session with check-in/update since previous session. Utilized empathetic and reflective listening. Used open-ended questions to facilitate discussion and summarized thoughts/feelings. Scheduled additional appointment and concluded session.   Suicidal/Homicidal: No  Plan: Return again in 4  weeks.  Diagnosis: Bipolar disorder, current episode mixed, mild (HCC) [F31.61]  PTSD (post-traumatic stress disorder)  Collaboration of Care: Medication Management AEB chart review  Patient/Guardian was advised Release of Information must be obtained prior to any record release in order to collaborate their care with an outside provider. Patient/Guardian was advised if they have not already done so to contact the registration department to sign all necessary forms in order for us  to release information regarding their care.   Consent: Patient/Guardian gives verbal consent for treatment and assignment of benefits for services provided during this visit. Patient/Guardian expressed understanding and agreed to proceed.   Tammy Young, [facility] 03/31/2024

## 2024-03-31 NOTE — Progress Notes (Signed)
 Nursing Pain Medication Assessment:  Safety precautions to be maintained throughout the outpatient stay will include: orient to surroundings, keep bed in low position, maintain call bell within reach at all times, provide assistance with transfer out of bed and ambulation.  Medication Inspection Compliance: Pill count conducted under aseptic conditions, in front of the patient. Neither the pills nor the bottle was removed from the patient's sight at any time. Once count was completed pills were immediately returned to the patient in their original bottle.  Medication: Hydrocodone /APAP Pill/Patch Count: 67 of 90 pills/patches remain Pill/Patch Appearance: Markings consistent with prescribed medication Bottle Appearance: Standard pharmacy container. Clearly labeled. Filled Date: 08 / 23 / 2025 Last Medication intake:  Today
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
## 2024-04-07 ENCOUNTER — Ambulatory Visit (INDEPENDENT_AMBULATORY_CARE_PROVIDER_SITE_OTHER): Admitting: Psychiatry

## 2024-04-07 ENCOUNTER — Encounter: Payer: Self-pay | Admitting: Psychiatry

## 2024-04-07 VITALS — BP 112/72 | HR 93 | Temp 97.8°F | Ht 63.0 in | Wt 156.0 lb

## 2024-04-07 DIAGNOSIS — Z79899 Other long term (current) drug therapy: Secondary | ICD-10-CM | POA: Diagnosis not present

## 2024-04-07 DIAGNOSIS — F3161 Bipolar disorder, current episode mixed, mild: Secondary | ICD-10-CM | POA: Diagnosis not present

## 2024-04-07 DIAGNOSIS — G2571 Drug induced akathisia: Secondary | ICD-10-CM

## 2024-04-07 DIAGNOSIS — F4310 Post-traumatic stress disorder, unspecified: Secondary | ICD-10-CM

## 2024-04-07 NOTE — Patient Instructions (Signed)
 Continue risperidone  2 mg  Continue metformin  500 mg at night  Continue fluoxetine  20 mg daily  Continue benztropine  1 mg at night  Continue prazosin  2 mg  Hold mirtazapine   Continue hydroxyzine  50 mg in AM- 25 mg in PM a day as needed for anxiety  Next appointment: 11/13 at 8:30

## 2024-04-08 ENCOUNTER — Encounter
Admission: RE | Admit: 2024-04-08 | Discharge: 2024-04-08 | Disposition: A | Discharge status: Home / Self Care | Source: Ambulatory Visit | Attending: Orthopedic Surgery | Admitting: Orthopedic Surgery

## 2024-04-08 ENCOUNTER — Other Ambulatory Visit: Payer: Self-pay

## 2024-04-08 DIAGNOSIS — I129 Hypertensive chronic kidney disease with stage 1 through stage 4 chronic kidney disease, or unspecified chronic kidney disease: Secondary | ICD-10-CM | POA: Diagnosis not present

## 2024-04-08 DIAGNOSIS — I10 Essential (primary) hypertension: Secondary | ICD-10-CM

## 2024-04-08 DIAGNOSIS — N183 Chronic kidney disease, stage 3 unspecified: Secondary | ICD-10-CM | POA: Diagnosis not present

## 2024-04-08 DIAGNOSIS — Z01812 Encounter for preprocedural laboratory examination: Secondary | ICD-10-CM | POA: Diagnosis present

## 2024-04-08 DIAGNOSIS — G8929 Other chronic pain: Secondary | ICD-10-CM

## 2024-04-08 HISTORY — DX: Fibromyalgia: M79.7

## 2024-04-08 LAB — BASIC METABOLIC PANEL WITH GFR
Anion gap: 7 (ref 5–15)
BUN: 9 mg/dL (ref 6–20)
CO2: 27 mmol/L (ref 22–32)
Calcium: 9.2 mg/dL (ref 8.9–10.3)
Chloride: 109 mmol/L (ref 98–111)
Creatinine, Ser: 0.82 mg/dL (ref 0.44–1.00)
GFR, Estimated: >60 mL/min (ref >60)
Glucose, Bld: 141 mg/dL — ABNORMAL HIGH (ref 70–99)
Potassium: 3.7 mmol/L (ref 3.5–5.1)
Sodium: 143 mmol/L (ref 135–145)

## 2024-04-08 LAB — CBC
HCT: 37.1 % (ref 36.0–46.0)
Hemoglobin: 12.4 g/dL (ref 12.0–15.0)
MCH: 30.8 pg (ref 26.0–34.0)
MCHC: 33.4 g/dL (ref 30.0–36.0)
MCV: 92.3 fL (ref 80.0–100.0)
Platelets: 212 K/uL (ref 150–400)
RBC: 4.02 MIL/uL (ref 3.87–5.11)
RDW: 13.4 % (ref 11.5–15.5)
WBC: 5.2 K/uL (ref 4.0–10.5)
nRBC: 0 % (ref 0.0–0.2)

## 2024-04-08 NOTE — Patient Instructions (Addendum)
 Your procedure is scheduled on: 04/14/24 - Tuesday Report to the Registration Desk on the 1st floor of the [facility]. To find out your arrival time, please call [phone] between 1PM - 3PM on: 04/13/24 - Monday If your arrival time is 6:00 am, do not arrive before that time as the [facility] entrance doors do not open until 6:00 am.  REMEMBER: Instructions that are not followed completely may result in serious medical risk, up to and including death; or upon the discretion of your surgeon and anesthesiologist your surgery may need to be rescheduled.  Do not eat food after midnight the night before surgery.  No gum chewing or hard candies.  You may however, drink CLEAR liquids up to 2 hours before you are scheduled to arrive for your surgery. Do not drink anything within 2 hours of your scheduled arrival time.  Clear liquids include: - water   - apple juice without pulp - gatorade (not RED colors) - black coffee or tea (Do NOT add milk or creamers to the coffee or tea) Do NOT drink anything that is not on this list.   In addition, your doctor has ordered for you to drink the provided:  Ensure Pre-Surgery Clear Carbohydrate Drink  Drinking this carbohydrate drink up to two hours before surgery helps to reduce insulin resistance and improve patient outcomes. Please complete drinking 2 hours before scheduled arrival time.  One week prior to surgery: Stop Anti-inflammatories (NSAIDS) such as Advil, Aleve , Ibuprofen, Motrin, Naproxen , Naprosyn  and Aspirin  based products such as Excedrin, Goody's Powder, BC Powder. You may continue to take Tylenol  if needed for pain up until the day of surgery.  Stop ANY OVER THE COUNTER supplements until after surgery : Multiple Vitamins  HOLD metFORMIN  (GLUCOPHAGE )  beginning 04/12/24.  HOLD WEGOVY 7 days prior to your surgery, last dose 04/06/24.  ON THE DAY OF SURGERY ONLY TAKE THESE MEDICATIONS WITH SIPS OF WATER :  FLUoxetine  (PROZAC )   hydrOXYzine  (ATARAX )  linaclotide  (LINZESS )  pregabalin (LYRICA)  RABEprazole (ACIPHEX)    No Alcohol for 24 hours before or after surgery.  No Smoking including e-cigarettes for 24 hours before surgery.  No chewable tobacco products for at least 6 hours before surgery.  No nicotine patches on the day of surgery.  Do not use any recreational drugs for at least a week (preferably 2 weeks) before your surgery.  Please be advised that the combination of cocaine and anesthesia may have negative outcomes, up to and including death. If you test positive for cocaine, your surgery will be cancelled.  On the morning of surgery brush your teeth with toothpaste and water , you may rinse your mouth with mouthwash if you wish. Do not swallow any toothpaste or mouthwash.  Use CHG Soap or wipes as directed on instruction sheet.  Do not wear jewelry, make-up, hairpins, clips or nail polish.  For welded (permanent) jewelry: bracelets, anklets, waist bands, etc.  Please have this removed prior to surgery.  If it is not removed, there is a chance that hospital personnel will need to cut it off on the day of surgery.  Do not wear lotions, powders, or perfumes.   Do not shave body hair from the neck down 48 hours before surgery.  Contact lenses, hearing aids and dentures may not be worn into surgery.  Do not bring valuables to the hospital. [facility] is not responsible for any missing/lost belongings or valuables.   Notify your doctor if there is any change in your  medical condition (cold, fever, infection).  Wear comfortable clothing (specific to your surgery type) to the hospital.  After surgery, you can help prevent lung complications by doing breathing exercises.  Take deep breaths and cough every 1-2 hours. Your doctor may order a device called an Incentive Spirometer to help you take deep breaths.  When coughing or sneezing, hold a pillow firmly against your incision with both hands.  This is called "splinting." Doing this helps protect your incision. It also decreases belly discomfort.  If you are being admitted to the hospital overnight, leave your suitcase in the car. After surgery it may be brought to your room.  In case of increased patient census, it may be necessary for you, the patient, to continue your postoperative care in the Same Day Surgery department.  If you are being discharged the day of surgery, you will not be allowed to drive home. You will need a responsible individual to drive you home and stay with you for 24 hours after surgery.   If you are taking public transportation, you will need to have a responsible individual with you.  Please call the Pre-admissions Testing Dept. at [phone] if you have any questions about these instructions.  Surgery Visitation Policy:  Patients having surgery or a procedure may have two visitors.  Children under the age of 5 must have an adult with them who is not the patient.  Inpatient Visitation:    Visiting hours are 7 a.m. to 8 p.m. Up to four visitors are allowed at one time in a patient room. The visitors may rotate out with other people during the day.  One visitor age 69 or older may stay with the patient overnight and must be in the room by 8 p.m.   Merchandiser, retail to address health-related social needs:  [email or web address]

## 2024-04-11 ENCOUNTER — Other Ambulatory Visit: Payer: Self-pay | Admitting: Psychiatry

## 2024-04-14 ENCOUNTER — Other Ambulatory Visit: Payer: Self-pay

## 2024-04-14 ENCOUNTER — Encounter
Admission: RE | Disposition: A | Discharge status: Home / Self Care | Payer: Self-pay | Source: Home / Self Care | Attending: Orthopedic Surgery

## 2024-04-14 ENCOUNTER — Ambulatory Visit: Payer: Self-pay | Admitting: Urgent Care

## 2024-04-14 ENCOUNTER — Ambulatory Visit

## 2024-04-14 ENCOUNTER — Ambulatory Visit: Admitting: Certified Registered"

## 2024-04-14 ENCOUNTER — Ambulatory Visit
Admission: RE | Admit: 2024-04-14 | Discharge: 2024-04-14 | Disposition: A | Discharge status: Home / Self Care | Attending: Orthopedic Surgery | Admitting: Orthopedic Surgery

## 2024-04-14 ENCOUNTER — Encounter: Payer: Self-pay | Admitting: Orthopedic Surgery

## 2024-04-14 DIAGNOSIS — K219 Gastro-esophageal reflux disease without esophagitis: Secondary | ICD-10-CM | POA: Diagnosis not present

## 2024-04-14 DIAGNOSIS — Z7951 Long term (current) use of inhaled steroids: Secondary | ICD-10-CM | POA: Diagnosis not present

## 2024-04-14 DIAGNOSIS — F419 Anxiety disorder, unspecified: Secondary | ICD-10-CM | POA: Insufficient documentation

## 2024-04-14 DIAGNOSIS — M75112 Incomplete rotator cuff tear or rupture of left shoulder, not specified as traumatic: Secondary | ICD-10-CM | POA: Diagnosis not present

## 2024-04-14 DIAGNOSIS — F32A Depression, unspecified: Secondary | ICD-10-CM | POA: Insufficient documentation

## 2024-04-14 DIAGNOSIS — M25812 Other specified joint disorders, left shoulder: Secondary | ICD-10-CM | POA: Insufficient documentation

## 2024-04-14 DIAGNOSIS — E039 Hypothyroidism, unspecified: Secondary | ICD-10-CM | POA: Insufficient documentation

## 2024-04-14 DIAGNOSIS — Z79899 Other long term (current) drug therapy: Secondary | ICD-10-CM | POA: Insufficient documentation

## 2024-04-14 DIAGNOSIS — J449 Chronic obstructive pulmonary disease, unspecified: Secondary | ICD-10-CM | POA: Insufficient documentation

## 2024-04-14 DIAGNOSIS — I10 Essential (primary) hypertension: Secondary | ICD-10-CM | POA: Diagnosis not present

## 2024-04-14 DIAGNOSIS — M19012 Primary osteoarthritis, left shoulder: Secondary | ICD-10-CM | POA: Insufficient documentation

## 2024-04-14 DIAGNOSIS — G8929 Other chronic pain: Secondary | ICD-10-CM | POA: Diagnosis present

## 2024-04-14 DIAGNOSIS — M25512 Pain in left shoulder: Secondary | ICD-10-CM | POA: Diagnosis present

## 2024-04-14 HISTORY — PX: SUBACROMIAL DECOMPRESSION: SHX5174

## 2024-04-14 HISTORY — PX: SHOULDER ARTHROSCOPY WITH OPEN ROTATOR CUFF REPAIR: SHX6092

## 2024-04-14 HISTORY — PX: BICEPT TENODESIS: SHX5116

## 2024-04-14 SURGERY — ARTHROSCOPY, SHOULDER WITH REPAIR, ROTATOR CUFF, OPEN
Anesthesia: General | Site: Shoulder | Laterality: Left

## 2024-04-14 MED ORDER — LIDOCAINE HCL (PF) 1 % IJ SOLN
INTRAMUSCULAR | Status: DC | PRN
  Administered 2024-04-14: 3 mL

## 2024-04-14 MED ORDER — BUPIVACAINE LIPOSOME 1.3 % IJ SUSP
INTRAMUSCULAR | Status: AC
  Filled 2024-04-14: qty 20

## 2024-04-14 MED ORDER — RINGERS IRRIGATION IR SOLN
Status: DC | PRN
  Administered 2024-04-14: 5000 mL

## 2024-04-14 MED ORDER — LIDOCAINE HCL (PF) 1 % IJ SOLN
INTRAMUSCULAR | Status: AC
  Filled 2024-04-14: qty 5

## 2024-04-14 MED ORDER — CEFAZOLIN SODIUM-DEXTROSE 2-4 GM/100ML-% IV SOLN
INTRAVENOUS | Status: AC
  Filled 2024-04-14: qty 100

## 2024-04-14 MED ORDER — ONDANSETRON 4 MG PO TBDP: 4.0000 mg | ORAL_TABLET | Freq: Three times a day (TID) | ORAL | 0 refills | Status: AC | PRN

## 2024-04-14 MED ORDER — 0.9 % SODIUM CHLORIDE (POUR BTL) OPTIME
TOPICAL | Status: DC | PRN
  Administered 2024-04-14: 500 mL

## 2024-04-14 MED ORDER — FENTANYL CITRATE (PF) 100 MCG/2ML IJ SOLN: 25.0000 ug | INTRAMUSCULAR | Status: DC | PRN

## 2024-04-14 MED ORDER — PROPOFOL 10 MG/ML IV BOLUS
INTRAVENOUS | Status: AC
  Filled 2024-04-14: qty 20

## 2024-04-14 MED ORDER — EPINEPHRINE PF 1 MG/ML IJ SOLN
INTRAMUSCULAR | Status: AC
  Filled 2024-04-14: qty 4

## 2024-04-14 MED ORDER — ASPIRIN 325 MG PO TBEC: 325.0000 mg | DELAYED_RELEASE_TABLET | Freq: Every day | ORAL | 0 refills | Status: AC

## 2024-04-14 MED ORDER — ONDANSETRON HCL 4 MG/2ML IJ SOLN
INTRAMUSCULAR | Status: DC | PRN
  Administered 2024-04-14: 4 mg via INTRAVENOUS

## 2024-04-14 MED ORDER — FENTANYL CITRATE (PF) 100 MCG/2ML IJ SOLN
INTRAMUSCULAR | Status: DC | PRN
  Administered 2024-04-14: 25 ug via INTRAVENOUS
  Administered 2024-04-14: 50 ug via INTRAVENOUS
  Administered 2024-04-14: 25 ug via INTRAVENOUS

## 2024-04-14 MED ORDER — SEVOFLURANE IN SOLN
RESPIRATORY_TRACT | Status: AC
  Filled 2024-04-14: qty 250

## 2024-04-14 MED ORDER — ONDANSETRON HCL 4 MG/2ML IJ SOLN
INTRAMUSCULAR | Status: AC
  Filled 2024-04-14: qty 2

## 2024-04-14 MED ORDER — LACTATED RINGERS IV SOLN
INTRAVENOUS | Status: DC | PRN
  Administered 2024-04-14: 12000 mL

## 2024-04-14 MED ORDER — ORAL CARE MOUTH RINSE: 15.0000 mL | Freq: Once | OROMUCOSAL | Status: AC

## 2024-04-14 MED ORDER — BUPIVACAINE LIPOSOME 1.3 % IJ SUSP
INTRAMUSCULAR | Status: DC | PRN
  Administered 2024-04-14: 20 mL

## 2024-04-14 MED ORDER — SUGAMMADEX SODIUM 200 MG/2ML IV SOLN
INTRAVENOUS | Status: DC | PRN
  Administered 2024-04-14: 200 mg via INTRAVENOUS

## 2024-04-14 MED ORDER — MIDAZOLAM HCL 2 MG/2ML IJ SOLN
INTRAMUSCULAR | Status: AC
  Filled 2024-04-14: qty 2

## 2024-04-14 MED ORDER — OXYCODONE HCL 5 MG/5ML PO SOLN: 5.0000 mg | Freq: Once | ORAL | Status: DC | PRN

## 2024-04-14 MED ORDER — PHENYLEPHRINE 80 MCG/ML (10ML) SYRINGE FOR IV PUSH (FOR BLOOD PRESSURE SUPPORT)
PREFILLED_SYRINGE | INTRAVENOUS | Status: DC | PRN
Start: 2024-04-14 — End: 2024-04-14
  Administered 2024-04-14 (×2): 160 ug via INTRAVENOUS

## 2024-04-14 MED ORDER — OXYCODONE HCL 5 MG PO TABS: 5.0000 mg | ORAL_TABLET | Freq: Once | ORAL | Status: DC | PRN

## 2024-04-14 MED ORDER — DEXAMETHASONE SODIUM PHOSPHATE 10 MG/ML IJ SOLN
INTRAMUSCULAR | Status: AC
  Filled 2024-04-14: qty 1

## 2024-04-14 MED ORDER — CHLORHEXIDINE GLUCONATE 0.12 % MT SOLN
15.0000 mL | Freq: Once | OROMUCOSAL | Status: AC
  Administered 2024-04-14: 15 mL via OROMUCOSAL

## 2024-04-14 MED ORDER — PHENYLEPHRINE HCL-NACL 20-0.9 MG/250ML-% IV SOLN
INTRAVENOUS | Status: DC | PRN
  Administered 2024-04-14: 40 ug/min via INTRAVENOUS

## 2024-04-14 MED ORDER — ACETAMINOPHEN 500 MG PO TABS: 1000.0000 mg | ORAL_TABLET | Freq: Three times a day (TID) | ORAL | 2 refills | Status: AC

## 2024-04-14 MED ORDER — CHLORHEXIDINE GLUCONATE 0.12 % MT SOLN
OROMUCOSAL | Status: AC
  Filled 2024-04-14: qty 15

## 2024-04-14 MED ORDER — PHENYLEPHRINE HCL-NACL 20-0.9 MG/250ML-% IV SOLN
INTRAVENOUS | Status: AC
  Filled 2024-04-14: qty 250

## 2024-04-14 MED ORDER — CEFAZOLIN SODIUM-DEXTROSE 2-4 GM/100ML-% IV SOLN
2.0000 g | INTRAVENOUS | Status: AC
  Administered 2024-04-14: 2 g via INTRAVENOUS

## 2024-04-14 MED ORDER — FENTANYL CITRATE (PF) 100 MCG/2ML IJ SOLN
INTRAMUSCULAR | Status: AC
  Filled 2024-04-14: qty 2

## 2024-04-14 MED ORDER — LIDOCAINE HCL (CARDIAC) PF 100 MG/5ML IV SOSY
PREFILLED_SYRINGE | INTRAVENOUS | Status: DC | PRN
  Administered 2024-04-14: 20 mg via INTRAVENOUS

## 2024-04-14 MED ORDER — MIDAZOLAM HCL 2 MG/2ML IJ SOLN
2.0000 mg | Freq: Once | INTRAMUSCULAR | Status: AC
Start: 2024-04-14 — End: 2024-04-14
  Administered 2024-04-14 (×2): 1 mg via INTRAVENOUS

## 2024-04-14 MED ORDER — OXYCODONE HCL 5 MG PO TABS: 5.0000 mg | ORAL_TABLET | ORAL | 0 refills | Status: DC | PRN

## 2024-04-14 MED ORDER — ROCURONIUM BROMIDE 100 MG/10ML IV SOLN
INTRAVENOUS | Status: DC | PRN
  Administered 2024-04-14: 50 mg via INTRAVENOUS
  Administered 2024-04-14: 10 mg via INTRAVENOUS

## 2024-04-14 MED ORDER — LACTATED RINGERS IV SOLN
INTRAVENOUS | Status: DC
Start: 2024-04-14 — End: 2024-04-14

## 2024-04-14 MED ORDER — DEXAMETHASONE SODIUM PHOSPHATE 10 MG/ML IJ SOLN
INTRAMUSCULAR | Status: DC | PRN
  Administered 2024-04-14: 5 mg via INTRAVENOUS

## 2024-04-14 MED ORDER — PROPOFOL 10 MG/ML IV BOLUS
INTRAVENOUS | Status: DC | PRN
  Administered 2024-04-14: 100 mg via INTRAVENOUS

## 2024-04-14 SURGICAL SUPPLY — 63 items
ADAPTER IRRIG TUBE 2 SPIKE SOL (ADAPTER) ×2 IMPLANT
ANCH SUT FIBERTAK 2.6 SP#2 (Anchor) IMPLANT
ANCHOR BONE REGENETEN (Anchor) IMPLANT
ANCHOR TENDON REGENETEN (Staple) IMPLANT
BLADE SHAVER 4.5X7 STR FR (MISCELLANEOUS) ×1 IMPLANT
BLADE SW THK.38XMED LNG THN (BLADE) IMPLANT
BNDG ADH 2 X3.75 FABRIC TAN LF (GAUZE/BANDAGES/DRESSINGS) ×1 IMPLANT
BUR BR 5.5 WIDE MOUTH (BURR) ×1 IMPLANT
CANNULA PART THRD DISP 5.75X7 (CANNULA) IMPLANT
CANNULA PARTIAL THREAD 2X7 (CANNULA) ×1 IMPLANT
CANNULA TWIST IN 8.25X9CM (CANNULA) IMPLANT
CHLORAPREP W/TINT 26 (MISCELLANEOUS) ×1 IMPLANT
COOLER ICEMAN CLASSIC (MISCELLANEOUS) ×1 IMPLANT
COVER LIGHT HANDLE STERIS (MISCELLANEOUS) ×1 IMPLANT
DERMABOND ADVANCED .7 DNX12 (GAUZE/BANDAGES/DRESSINGS) IMPLANT
DRAPE INCISE IOBAN 66X45 STRL (DRAPES) ×1 IMPLANT
ELECTRODE REM PT RTRN 9FT ADLT (ELECTROSURGICAL) ×1 IMPLANT
GAUZE SPONGE 4X4 12PLY STRL (GAUZE/BANDAGES/DRESSINGS) ×1 IMPLANT
GAUZE XEROFORM 1X8 LF (GAUZE/BANDAGES/DRESSINGS) ×1 IMPLANT
GLOVE BIO SURGEON STRL SZ7.5 (GLOVE) ×2 IMPLANT
GLOVE BIOGEL PI IND STRL 8 (GLOVE) ×2 IMPLANT
GLOVE SURG SYN 8.0 PF PI (GLOVE) ×2 IMPLANT
GOWN SRG LRG LVL 4 IMPRV REINF (GOWNS) ×1 IMPLANT
GOWN SRG XL LONG LVL 3 NONREIN (GOWNS) ×1 IMPLANT
GOWN STRL REUS W/ TWL LRG LVL3 (GOWN DISPOSABLE) ×1 IMPLANT
IMPL REGENETEN MEDIUM (Shoulder) IMPLANT
IV LR IRRIG 3000ML ARTHROMATIC (IV SOLUTION) ×4 IMPLANT
KIT STABILIZATION SHOULDER (MISCELLANEOUS) ×1 IMPLANT
KIT TURNOVER KIT A (KITS) ×1 IMPLANT
MANIFOLD NEPTUNE II (INSTRUMENTS) ×1 IMPLANT
MASK FACE SPIDER DISP (MASK) ×1 IMPLANT
MAT ABSORB FLUID 56X50 GRAY (MISCELLANEOUS) ×2 IMPLANT
NDL HYPO 22X1.5 SAFETY MO (MISCELLANEOUS) IMPLANT
NDL MAYO 6 CRC TAPER PT (NEEDLE) IMPLANT
NDL SCORPION MULTI FIRE (NEEDLE) IMPLANT
NEEDLE HYPO 22X1.5 SAFETY MO (MISCELLANEOUS) IMPLANT
NEEDLE MAYO 6 CRC TAPER PT (NEEDLE) IMPLANT
NEEDLE SCORPION MULTI FIRE (NEEDLE) IMPLANT
PACK ARTHROSCOPY SHOULDER (MISCELLANEOUS) ×1 IMPLANT
PAD ABD DERMACEA PRESS 5X9 (GAUZE/BANDAGES/DRESSINGS) ×1 IMPLANT
PAD ARMBOARD POSITIONER FOAM (MISCELLANEOUS) ×2 IMPLANT
PAD COLD SHLDR SM WRAP-ON (PAD) ×1 IMPLANT
PASSER SUT FIRSTPASS SELF (INSTRUMENTS) IMPLANT
SLING ULTRA II M (MISCELLANEOUS) IMPLANT
SPONGE T-LAP 18X18 ~~LOC~~+RFID (SPONGE) ×1 IMPLANT
STRAP SAFETY 5IN WIDE (MISCELLANEOUS) ×1 IMPLANT
SUT ETHILON 3-0 (SUTURE) ×1 IMPLANT
SUT LASSO 90 DEG SD STR (SUTURE) IMPLANT
SUT PROLENE 0 CT 2 (SUTURE) IMPLANT
SUT TICRON 2-0 30IN 311381 (SUTURE) IMPLANT
SUT VIC AB 0 CT1 36 (SUTURE) IMPLANT
SUT VIC AB 2-0 CT2 27 (SUTURE) IMPLANT
SUTURE EHLN 3-0 FS-10 30 BLK (SUTURE) IMPLANT
SUTURE MNCRL 4-0 27XMF (SUTURE) IMPLANT
SUTURE TAPE 1.3 40 TPR END (SUTURE) IMPLANT
SYR 10ML LL (SYRINGE) IMPLANT
SYSTEM IMPL TENODESIS LNT 2.9 (Orthopedic Implant) IMPLANT
TAPE MICROFOAM 4IN (TAPE) ×1 IMPLANT
TRAP FLUID SMOKE EVACUATOR (MISCELLANEOUS) ×1 IMPLANT
TUBE SET DOUBLEFLO INFLOW (TUBING) ×1 IMPLANT
TUBE SET DOUBLEFLO OUTFLOW (TUBING) ×1 IMPLANT
WAND WEREWOLF FLOW 90D (MISCELLANEOUS) ×1 IMPLANT
WATER STERILE IRR 500ML POUR (IV SOLUTION) ×1 IMPLANT

## 2024-04-14 NOTE — H&P (Signed)
 Paper H&P to be scanned into permanent record. H&P reviewed. No significant changes noted.

## 2024-04-14 NOTE — Op Note (Signed)
 SURGERY DATE: 04/14/2024   PRE-OP DIAGNOSIS:  1. Left partial thickness rotator cuff tear 2. Left biceps tendinopathy 3. Left subacromial impingement 4. Left acromioclavicular joint arthritis  POST-OP DIAGNOSIS: 1. Left partial thickness rotator cuff tear 2. Left biceps tendinopathy 3. Left subacromial impingement 4. Left acromioclavicular joint arthritis  PROCEDURES:  1. Left arthroscopic rotator cuff (upper border subscapularis + Regeneten patch for partial thickness supraspinatus) 2. Left arthroscopic biceps tenodesis 3. Left extensive debridement of shoulder (glenohumeral and subacromial spaces) 4. Left arthroscopic distal clavicle excision 5. Left arthroscopic subacromial decompression  SURGEON: Earnestine HILARIO Blanch, MD  ASSISTANT: DOROTHA Krystal Doyne, PA  ANESTHESIA: Gen with interscalene block w/Exparel   ESTIMATED BLOOD LOSS: 5cc  DRAINS:  none  TOTAL IV FLUIDS: per anesthesia   SPECIMENS: none  IMPLANTS:  - [employer] Regeneten patch with associated tendon and bone staples - Arthrex 2.64mm PushLock anchor (arthroscopic biceps tenodesis) - Arthrex 1.52mm Double Knotless Fibertak x 1  OPERATIVE FINDINGS:  Examination under anesthesia: A careful examination under anesthesia was performed.  Passive range of motion was: FF: 140; ER at side: 50; ER in abduction: 110; IR in abduction: 45.  Anterior load shift: NT.  Posterior load shift: NT.  Sulcus in neutral: NT.  Sulcus in ER: NT.    Intra-operative findings: A thorough arthroscopic examination of the shoulder was performed.  The findings are: 1. Biceps tendon: Significant tendinopathy with erythema 2. Superior labrum: Degenerative fraying 3. Posterior labrum and capsule: Degenerative labral fraying 4. Inferior capsule and inferior recess: normal 5. Glenoid cartilage surface: Normal 6. Supraspinatus attachment: partial thickness tearing of anterior supraspinatus involving approximately 10% of articular surface and partial  thickness bursal sided tearing affecting approximately 40% of the thickness 7. Posterior rotator cuff attachment: normal  8. Humeral head articular cartilage: Extensive grade 4 degenerative change with large, loose flap of cartilage 9. Rotator interval: Synovitic 10: Subscapularis tendon: Upper border partial-thickness tearing 11. Anterior labrum: degenerative tearing 12. IGHL: normal  OPERATIVE REPORT:   Indications for procedure: Tammy Young is a 58 y.o. female with chronic shoulder pain. Clinical exam and MRI were significant for partial thickness rotator cuff tear, AC joint arthritis, subacromial impingement, and biceps tendinopathy.  Patient underwent preoperative diagnostic glenohumeral and subacromial space local anesthetic injection.  She did get significantly improved, but not complete resolution of her symptoms with this.  After discussion of risks, benefits, and alternatives to surgery, the patient elected to proceed with above mentioned procedure. The patient understands that use of the Regeneten patch is relatively new and long-term data is unknown.  She also understands that she may not get complete relief of her symptoms with this procedure.  Procedure in detail:  I identified Tammy Young in the pre-operative holding area.  I marked the operative shoulder with my initials. I reviewed the risks and benefits of the proposed surgical intervention, and the patient wished to proceed.  Anesthesia was then performed with an interscalene block with Exparel .  The patient was transferred to the operative suite and placed in the beach chair position.    SCDs were placed on the lower extremities. Appropriate IV antibiotics were administered. The operative upper extremity was then prepped and draped in standard fashion. A time out was performed confirming the correct extremity, correct patient and correct procedure.   I then created a standard posterior portal with an 11 blade. The  glenohumeral joint was easily entered with a blunt trochar and the arthroscope introduced. The findings of diagnostic arthroscopy  are described above. I debrided the degenerative anterior labrum, posterior labrum, superior labrum, and also debrided and coagulated the inflamed synovium to obtain hemostasis and reduce the risk of post-operative swelling using an Arthrocare radiofrequency device.  There was a visible, small grade 4 defect of the humeral head, but surrounding cartilage was completely delaminated and loose.  This was debrided using combination of an oscillating shaver and an arthroscopic biter until there were stable cartilaginous edges.  After debridement, there was an approximately 15 x 10 mm grade 4 chondral defect of the humeral head.  The articular side of the anterior supraspinatus was also debrided given the significant amount of frayed fibers and approximately 10% articular sided tearing.  I then turned my attention to the arthroscopic biceps tenodesis.  I used the loop n tack technique to pass a fiber tape through the biceps in a locked fashion adjacent to the biceps anchor.  The biceps tendon was cut at its attachment on the superior labrum.  The remnant stump was debrided down to a stable base on the biceps anchor complex using an oscillating shaver.  A hole for a 2.9 mm Arthrex PushLock was drilled in the bicipital groove just superior to the subscapularis tendon insertion.  The fiber tape was loaded onto the PushLock anchor and impacted into place into the previously drilled hole in the bicipital groove.  This appropriately secured the biceps into the bicipital groove and took it off of tension.  Next, arthroscopic repair of the subscapularis was performed. The lesser tuberosity footprint was prepared with a combination of electrocautery and an arthroscopic curette.  An Arthrex double knotless FiberTak was placed into the lesser tuberosity footprint from the anterior portal.  A BirdBeak  was used to shuttle the repair sutures through the upper border of the subscapularis tendon.  The repair sutures were then shuttled through the anchor. With the arm in neutral rotation, the repair was tensioned appropriately. This appropriately reduced the subscapularis tear.  The arm was then internally and externally rotated and the subscapularis was noted to move appropriately with rotation.  The remainder of the suture was then cut.  Next, the arthroscope was then introduced into the subacromial space. A direct lateral portal was created with an 11-blade after spinal needle localization. An extensive subacromial bursectomy was performed using a combination of the shaver and Arthrocare wand. The entire acromial undersurface was exposed and the CA ligament was subperiosteally elevated to expose the prominent anterior acromial hook. A burr was used to create a flat anterior and lateral aspect of the acromion, converting it from a Type 2 to a Type 1 acromion. Care was made to keep the deltoid fascia intact.  Then, I exposed the acromioclavicular joint using a combination of shaver and arthrocare wand. The distal 10mm of clavicle was removed using a 5.31mm burr (2 burr widths removed). Adequate resection was confirmed by placing the camera into the anterior portal and by using a probe to measure the distance between the acromion and distal clavicle. Care was taken to preserve the superior and posterior capsule.   Next, a switching stick was used to probe the bursal side of the rotator cuff.  There was a bursal sided partial-thickness tear involving approximately 40% of the thickness of the supraspinatus.  The bursal side of the rotator cuff was intact as the switching stick could not be pushed through into the glenohumeral joint. Given the partial-thickness nature of the tear, we decided to proceed with Regeneten patch placement. The Regeneten  patch delivery gun was placed appropriately and the patch was  delivered over the supraspinatus tendon using the PDS suture as a reference. It was positioned such the medial border of the patch was just medial to the PDS suture. Tendon staples were placed medially, anteriorly, and posteriorly after making appropriate stab incisions for portal placement.  3 bone staples were then placed laterally. The patch was then probed to confirm appropriate stability.   Arthroscopic fluid was evacuated from the joint.  The portals were closed with 3-0 Nylon. Xeroform was applied to the portals. A sterile dressing was applied, followed by a Polar Care sleeve and a SlingShot shoulder immobilizer/sling. The patient awoke from anesthesia without difficulty and was transferred to the PACU in stable condition.    Of note, assistance from a PA was essential to performing the surgery.  PA was present for the entire surgery.  PA assisted with patient positioning, retraction, instrumentation, and wound closure. The surgery would have been more difficult and had longer operative time without PA assistance.   COMPLICATIONS: none  DISPOSITION: plan for discharge home after recovery in PACU  POSTOPERATIVE PLAN: Remain in sling (except hygiene and elbow/wrist/hand RoM exercises as instructed by PT) x 4 weeks and NWB for this time.  Use small/medium rotator cuff repair rehab protocol with subscapularis restrictions.  For the first 4 weeks, forward flexion is limited to 100. External rotation with the arm by the side is allowed, but abduction-external rotation is not allowed for the first 6 weeks. ASA for DVT prophylaxis.  Follow-up as an outpatient as scheduled in approximately 2 weeks.

## 2024-04-14 NOTE — Transfer of Care (Signed)
 Immediate Anesthesia Transfer of Care Note  Patient: Tammy Young  Procedure(s) Performed: ARTHROSCOPY, SHOULDER WITH REPAIR, ROTATOR CUFF, OPEN (Left: Shoulder) DECOMPRESSION, SUBACROMIAL SPACE (Left: Shoulder) TENODESIS, BICEPS (Left: Shoulder)  Patient Location: PACU  Anesthesia Type:General  Level of Consciousness: drowsy  Airway & Oxygen Therapy: Patient Spontanous Breathing and Patient connected to nasal cannula oxygen  Post-op Assessment: Report given to RN and Post -op Vital signs reviewed and stable  Post vital signs: Reviewed and stable  Last Vitals:  Vitals Value Taken Time  BP 108/58 04/14/24 09:49  Temp    Pulse 59 04/14/24 09:53  Resp 17 04/14/24 09:53  SpO2 100 % 04/14/24 09:53  Vitals shown include unfiled device data.  Last Pain:  Vitals:   04/14/24 0651  TempSrc: Temporal  PainSc: 6          Complications: No notable events documented.

## 2024-04-14 NOTE — Discharge Instructions (Addendum)
 Post-Op Instructions - Rotator Cuff Repair  1. Bracing: You will wear a shoulder immobilizer or sling for 4 weeks.   2. Driving: No driving for 4 weeks post-op.  3. Activity: No active lifting for 2 months. Wrist, hand, and elbow motion only. Avoid lifting the upper arm away from the body except for hygiene. You are permitted to bend and straighten the elbow passively only (no active elbow motion). You may use your hand and wrist for typing, writing, and managing utensils (cutting food). Do not lift more than a coffee cup for 8 weeks.  When sleeping or resting, inclined positions (recliner chair or wedge pillow) and a pillow under the forearm for support may provide better comfort for up to 4 weeks.  Avoid long distance travel for 4 weeks.  Return to normal activities after rotator cuff repair repair normally takes 6 months on average. If rehab goes very well, may be able to do most activities at 4 months, except overhead or contact sports.  4. Physical Therapy: Begins 3-4 days after surgery, and proceed 1 time per week for the first 6 weeks, then 1-2 times per week from weeks 6-20 post-op.  5. Medications:  - You will be provided a prescription for narcotic pain medicine. After surgery, take 1-2 narcotic tablets every 4 hours if needed for severe pain.  - A prescription for anti-nausea medication will be provided in case the narcotic medicine causes nausea - take 1 tablet every 6 hours only if nauseated.   - Take tylenol  1000 mg (2 Extra Strength tablets or 3 regular strength) every 8 hours for pain.  May decrease or stop tylenol  5 days after surgery if you are having minimal pain. - Take ASA 325mg /day x 2 weeks to help prevent DVTs/PEs (blood clots). Stop if any GI irritation.   If you are taking prescription medication for anxiety, depression, insomnia, muscle spasm, chronic pain, or for attention deficit disorder, you are advised that you are at a higher risk of adverse effects with use of  narcotics post-op, including narcotic addiction/dependence, depressed breathing, death. If you use non-prescribed substances: alcohol, marijuana, cocaine, heroin, methamphetamines, etc., you are at a higher risk of adverse effects with use of narcotics post-op, including narcotic addiction/dependence, depressed breathing, death. You are advised that taking > 50 morphine  milligram equivalents (MME) of narcotic pain medication per day results in twice the risk of overdose or death. For your prescription provided: oxycodone  5 mg - taking more than 6 tablets per day would result in > 50 morphine  milligram equivalents (MME) of narcotic pain medication. Be advised that we will prescribe narcotics short-term, for acute post-operative pain only - 3 weeks for major operations such as shoulder repair/reconstruction surgeries.     6. Post-Op Appointment:  Your first post-op appointment will be 10-14 days post-op.  7. Work or School: For most, but not all procedures, we advise staying out of work or school for at least 1 to 2 weeks in order to recover from the stress of surgery and to allow time for healing.   If you need a work or school note this can be provided.   8. Smoking: If you are a smoker, you need to refrain from smoking in the postoperative period. The nicotine in cigarettes will inhibit healing of your shoulder repair and decrease the chance of successful repair. Similarly, nicotine containing products (gum, patches) should be avoided.   Post-operative Brace: Apply and remove the brace you received as you were instructed to at  the time of fitting and as described in detail as the brace's instructions for use indicate.  Wear the brace for the period of time prescribed by your physician.  The brace can be cleaned with soap and water  and allowed to air dry only.  Should the brace result in increased pain, decreased feeling (numbness/tingling), increased swelling or an overall worsening of your  medical condition, please contact your doctor immediately.  If an emergency situation occurs as a result of wearing the brace after normal business hours, please dial 911 and seek immediate medical attention.  Let your doctor know if you have any further questions about the brace issued to you. Refer to the shoulder sling instructions for use if you have any questions regarding the correct fit of your shoulder sling.  [employer] [facility] [employer] for Troubleshooting: [phone]  Video that illustrates how to properly use a shoulder sling: Instructions for Proper Use of an Orthopaedic Sling [email or web address]://bass.com/

## 2024-04-14 NOTE — Anesthesia Procedure Notes (Signed)
 Anesthesia Regional Block: Interscalene brachial plexus block   Pre-Anesthetic Checklist: , timeout performed,  Correct Patient, Correct Site, Correct Laterality,  Correct Procedure, Correct Position, site marked,  Risks and benefits discussed,  Surgical consent,  Pre-op evaluation,  At surgeon's request and post-op pain management  Laterality: Left  Prep: alcohol swabs       Needles:  Injection technique: Single-shot  Needle Type: Echogenic Needle     Needle Length: 4cm  Needle Gauge: 25     Additional Needles:   Procedures:,,,, ultrasound used (permanent image in chart),,    Narrative:  Start time: 04/14/2024 7:30 AM End time: 04/14/2024 7:34 AM Injection made incrementally with aspirations every 5 mL.  Performed by: Personally  Anesthesiologist: Chesley Lendia CROME, MD  Additional Notes: Patient's chart reviewed and they were deemed appropriate candidate for procedure, at surgeon's request. Patient educated about risks, benefits, and alternatives of the block including but not limited to: temporary or permanent nerve damage, bleeding, infection, damage to surround tissues, pneumothorax, hemidiaphragmatic paralysis, unilateral Horner's syndrome, block failure, local anesthetic toxicity. Patient expressed understanding. A formal time-out was conducted consistent with institution rules.  Monitors were applied, and minimal sedation used (see nursing record). The site was prepped with skin prep and allowed to dry, and sterile gloves were used. A high frequency linear ultrasound probe with probe cover was utilized throughout. C5-7 nerve roots located and appeared anatomically normal, local anesthetic injected around them, and echogenic block needle trajectory was monitored throughout. Aspiration performed every 5ml. Lung and blood vessels were avoided. All injections were performed without resistance and free of blood and paresthesias. The patient tolerated the procedure well.  Injectate:  20ml exparel 

## 2024-04-14 NOTE — Anesthesia Procedure Notes (Signed)
 Procedure Name: Intubation Date/Time: 04/14/2024 7:51 AM  Performed by: Niki Manus SAUNDERS, CRNAPre-anesthesia Checklist: Patient identified, Patient being monitored, Timeout performed, Emergency Drugs available and Suction available Patient Re-evaluated:Patient Re-evaluated prior to induction Oxygen Delivery Method: Circle system utilized Preoxygenation: Pre-oxygenation with 100% oxygen Induction Type: IV induction Ventilation: Mask ventilation without difficulty Laryngoscope Size: Mac and 4 Grade View: Grade I Tube type: Oral Tube size: 7.0 mm Number of attempts: 1 Airway Equipment and Method: Stylet Placement Confirmation: ETT inserted through vocal cords under direct vision, positive ETCO2 and breath sounds checked- equal and bilateral Secured at: 21 cm Tube secured with: Tape Dental Injury: Teeth and Oropharynx as per pre-operative assessment

## 2024-04-14 NOTE — Anesthesia Preprocedure Evaluation (Addendum)
 Anesthesia Evaluation  Patient identified by MRN, date of birth, ID band Patient awake    Reviewed: Allergy & Precautions, NPO status , Patient's Chart, lab work & pertinent test results  History of Anesthesia Complications (+) PONV and history of anesthetic complications  Airway Mallampati: III  TM Distance: >3 FB Neck ROM: full    Dental no notable dental hx.    Pulmonary sleep apnea , COPD,  COPD inhaler   Pulmonary exam normal        Cardiovascular hypertension, On Medications Normal cardiovascular exam     Neuro/Psych  Headaches PSYCHIATRIC DISORDERS Anxiety Depression     Neuromuscular disease CVA, No Residual Symptoms    GI/Hepatic Neg liver ROS, hiatal hernia, PUD,GERD  Medicated and Controlled,,  Endo/Other  Hypothyroidism    Renal/GU Renal disease     Musculoskeletal   Abdominal   Peds  Hematology negative hematology ROS (+)   Anesthesia Other Findings Past Medical History: No date: Anxiety and depression No date: Arthritis No date: Chronic constipation No date: Chronic cough No date: Chronic kidney disease (CKD)     Comment:  stage 3 No date: Collagen vascular disease     Comment:  RA No date: COPD (chronic obstructive pulmonary disease) (HCC) No date: CTEPH (chronic thromboembolic pulmonary hypertension) (HCC) No date: Fatty liver disease, nonalcoholic No date: Fibromyalgia No date: GERD (gastroesophageal reflux disease) No date: Headache No date: Hemorrhoids No date: Herpes simplex vulvovaginitis No date: Hiatal hernia     Comment:  repaired No date: History of kidney stones No date: History of stomach ulcers No date: HLD (hyperlipidemia) No date: Hypertension     Comment:  Resolved after wt loss 10/23/2012: Hypothyroidism, unspecified No date: Liver disease No date: Lymphedema of right lower extremity No date: Morbid obesity (HCC) No date: Multiple gastric ulcers No date: Multiple  gastric ulcers 10/18/2017: Obstructive sleep apnea syndrome 12/2020: Personal history of COVID-19 No date: PONV (postoperative nausea and vomiting) No date: Pre-diabetes No date: Pulmonary embolism (HCC)     Comment:  HEART CATH DONE 12/2020 AND MRI DONE - PULMONARY EMBOLIS               - GONE No date: Renal disorder     Comment:  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 No date: Seizures (HCC)     Comment:  only had with preeclampsia during pregnancy at age 58.               none since No date: Stress incontinence (female) (female) No date: Stroke [facility])     Comment:  Identified on CT.  No deficits No date: Vitamin B 12 deficiency No date: Vitamin D  deficiency  Past Surgical History: 11/21/2022: ANTERIOR CERVICAL DECOMP/DISCECTOMY FUSION; N/A     Comment:  Procedure: C5-7 ANTERIOR CERVICAL DISCECTOMY AND FUSION;              Surgeon: Clois Fret, MD;  Location: [facility];                Service: Neurosurgery;  Laterality: N/A; 04/26/2023: BIOPSY     Comment:  Procedure: BIOPSY;  Surgeon: Maryruth Ole DASEN, MD;                Location: [facility];  Service: Endoscopy;; No date: BREAST SURGERY     Comment:  reduction No date: CARPAL TUNNEL RELEASE; Bilateral No date: CHOLECYSTECTOMY 04/26/2023: COLONOSCOPY  WITH PROPOFOL ; N/A     Comment:  Procedure: COLONOSCOPY WITH PROPOFOL ;  Surgeon:               Maryruth Ole DASEN, MD;  Location: [facility];                Service: Endoscopy;  Laterality: N/A;  NEEDS TO BE ABOUT               7:30 DUE TO TRANSPORTATION 10/09/2016: CYSTOSCOPY WITH URETEROSCOPY, STONE BASKETRY AND STENT  PLACEMENT     Comment:  Procedure: CYSTOSCOPY WITH URETEROSCOPY, STONE BASKETRY               AND STENT PLACEMENT;  Surgeon: Rosina Riis, MD;                Location: [facility];  Service: Urology;; 04/15/2015: DIRECT LARYNGOSCOPY; Left     Comment:  Procedure: DIRECT LARYNGOSCOPY BIOPSY AND LASER LEFT                TONGUE LESION;  Surgeon: Norleen Notice, MD;  Location: [facility];  Service: ENT;  Laterality: Left; 04/26/2023: ESOPHAGEAL BRUSHING     Comment:  Procedure: ESOPHAGEAL BRUSHING;  Surgeon: Maryruth Ole DASEN, MD;  Location: [facility];  Service:               Endoscopy;; 09/03/2019: ESOPHAGOGASTRODUODENOSCOPY (EGD) WITH PROPOFOL ; N/A     Comment:  Procedure: ESOPHAGOGASTRODUODENOSCOPY (EGD) WITH               PROPOFOL ;  Surgeon: Toledo, Ladell POUR, MD;  Location:               [facility];  Service: Gastroenterology;  Laterality:               N/A; 02/03/2021: ESOPHAGOGASTRODUODENOSCOPY (EGD) WITH PROPOFOL ; N/A     Comment:  Procedure: ESOPHAGOGASTRODUODENOSCOPY (EGD) WITH               PROPOFOL ;  Surgeon: Maryruth Ole DASEN, MD;  Location:               [facility];  Service: Endoscopy;  Laterality: N/A;                ELIQUIS  04/26/2023: ESOPHAGOGASTRODUODENOSCOPY (EGD) WITH PROPOFOL ; N/A     Comment:  Procedure: ESOPHAGOGASTRODUODENOSCOPY (EGD) WITH               PROPOFOL ;  Surgeon: Maryruth Ole DASEN, MD;  Location:               [facility];  Service: Endoscopy;  Laterality: N/A; 04/04/2021: GASTRIC ROUX-EN-Y; N/A     Comment:  Procedure: LAPAROSCOPIC ROUX-EN-Y GASTRIC BYPASS WITH               UPPER ENDOSCOPY;  Surgeon: Signe Mitzie LABOR, MD;                Location: [facility];  Service: General;  Laterality: N/A; No date: LEFT HEART CATH 04/26/2023: POLYPECTOMY     Comment:  Procedure: POLYPECTOMY;  Surgeon: Maryruth Ole DASEN,               MD;  Location: [facility];  Service: Endoscopy;; No date: REDUCTION MAMMAPLASTY 06/02/2020: RIGHT HEART CATH; N/A     Comment:  Procedure: RIGHT HEART CATH;  Surgeon:  Callwood, Dwayne               D, MD;  Location: [facility];  Service:               Cardiovascular;  Laterality: N/A; 01/01/2022: SHOULDER ARTHROSCOPY; Right     Comment:  Procedure: Right shoulder  arthroscopic rotator cuff               repair, subscapularis repaire, subpectoral biceps               tenodesis, distal clavical excision;  Surgeon: Tobie Priest, MD;  Location: [facility];  Service:               Orthopedics;  Laterality: Right; No date: TONSILLECTOMY 04/04/2021: UPPER GI ENDOSCOPY; N/A     Comment:  Procedure: UPPER GI ENDOSCOPY;  Surgeon: Signe Mitzie LABOR, MD;  Location: [facility];  Service: General;  Laterality:              N/A; No date: URETER SURGERY 2000: VAGINAL HYSTERECTOMY  BMI    Body Mass Index: 26.95 kg/m      Reproductive/Obstetrics negative OB ROS                              Anesthesia Physical Anesthesia Plan  ASA: 3  Anesthesia Plan: General ETT   Post-op Pain Management: Regional block* and Ofirmev  IV (intra-op)*   Induction: Intravenous  PONV Risk Score and Plan: 4 or greater and Ondansetron , Dexamethasone , Midazolam  and Treatment may vary due to age or medical condition  Airway Management Planned: Oral ETT  Additional Equipment:   Intra-op Plan:   Post-operative Plan: Extubation in OR  Informed Consent: I have reviewed the patients History and Physical, chart, labs and discussed the procedure including the risks, benefits and alternatives for the proposed anesthesia with the patient or authorized representative who has indicated his/her understanding and acceptance.     Dental Advisory Given  Plan Discussed with: Anesthesiologist, CRNA and Surgeon  Anesthesia Plan Comments: (Patient consented for risks of anesthesia including but not limited to:  - adverse reactions to medications - damage to eyes, teeth, lips or other oral mucosa - nerve damage due to positioning  - sore throat or hoarseness - Damage to heart, brain, nerves, lungs, other parts of body or loss of life  Patient voiced understanding and assent.)         Anesthesia Quick Evaluation

## 2024-04-14 NOTE — Anesthesia Postprocedure Evaluation (Signed)
 Anesthesia Post Note  Patient: Tammy Young  Procedure(s) Performed: ARTHROSCOPY, SHOULDER WITH REPAIR, ROTATOR CUFF, OPEN (Left: Shoulder) DECOMPRESSION, SUBACROMIAL SPACE (Left: Shoulder) TENODESIS, BICEPS (Left: Shoulder)  Patient location during evaluation: PACU Anesthesia Type: General Level of consciousness: awake and alert Pain management: pain level controlled Vital Signs Assessment: post-procedure vital signs reviewed and stable Respiratory status: spontaneous breathing, nonlabored ventilation, respiratory function stable and patient connected to nasal cannula oxygen Cardiovascular status: blood pressure returned to baseline and stable Postop Assessment: no apparent nausea or vomiting Anesthetic complications: no   No notable events documented.   Last Vitals:  Vitals:   04/14/24 1045 04/14/24 1053  BP: 110/78 126/67  Pulse: 62 63  Resp: 20 18  Temp: 36.4 C (!) 36.2 C  SpO2: 98% 98%    Last Pain:  Vitals:   04/14/24 1053  TempSrc: Temporal  PainSc:                  [name]

## 2024-04-15 ENCOUNTER — Encounter: Payer: Self-pay | Admitting: Orthopedic Surgery

## 2024-04-28 ENCOUNTER — Ambulatory Visit: Admitting: Professional Counselor

## 2024-05-04 ENCOUNTER — Other Ambulatory Visit: Payer: Self-pay | Admitting: Psychiatry

## 2024-05-04 MED ORDER — HYDROXYZINE HCL 25 MG PO TABS: ORAL_TABLET | ORAL | 0 refills | Status: DC

## 2024-05-24 ENCOUNTER — Other Ambulatory Visit: Payer: Self-pay | Admitting: Psychiatry

## 2024-05-25 ENCOUNTER — Ambulatory Visit: Attending: Nurse Practitioner | Admitting: Nurse Practitioner

## 2024-05-25 VITALS — BP 134/82 | HR 88 | Temp 97.0°F | Resp 16 | Ht 64.0 in | Wt 155.0 lb

## 2024-05-25 DIAGNOSIS — M797 Fibromyalgia: Secondary | ICD-10-CM | POA: Diagnosis present

## 2024-05-25 DIAGNOSIS — G894 Chronic pain syndrome: Secondary | ICD-10-CM | POA: Diagnosis not present

## 2024-05-25 DIAGNOSIS — M25511 Pain in right shoulder: Secondary | ICD-10-CM | POA: Insufficient documentation

## 2024-05-25 DIAGNOSIS — G8929 Other chronic pain: Secondary | ICD-10-CM | POA: Diagnosis present

## 2024-05-25 DIAGNOSIS — M12811 Other specific arthropathies, not elsewhere classified, right shoulder: Secondary | ICD-10-CM | POA: Insufficient documentation

## 2024-05-25 DIAGNOSIS — Z981 Arthrodesis status: Secondary | ICD-10-CM | POA: Diagnosis present

## 2024-05-25 DIAGNOSIS — Z79899 Other long term (current) drug therapy: Secondary | ICD-10-CM | POA: Insufficient documentation

## 2024-05-25 DIAGNOSIS — M25512 Pain in left shoulder: Secondary | ICD-10-CM | POA: Diagnosis present

## 2024-05-25 MED ORDER — OXYCODONE HCL 5 MG PO TABS: 5.0000 mg | ORAL_TABLET | Freq: Two times a day (BID) | ORAL | 0 refills | Status: AC | PRN

## 2024-05-25 MED ORDER — OXYCODONE HCL 5 MG PO TABS: 5.0000 mg | ORAL_TABLET | Freq: Two times a day (BID) | ORAL | 0 refills | Status: DC | PRN

## 2024-05-25 NOTE — Progress Notes (Signed)
 PROVIDER NOTE: Interpretation of information contained herein should be left to medically-trained personnel. Specific patient instructions are provided elsewhere under Patient Instructions section of medical record. This document was created in part using AI and STT-dictation technology, any transcriptional errors that may result from this process are unintentional.  Patient: Tammy Young  Service: E/M   PCP: Steva Clotilda DEL, NP  DOB: 1966-01-28  DOS: 05/25/2024  Provider: Emmy MARLA Blanch, NP  MRN: [id number]  Delivery: Face-to-face  Specialty: Interventional Pain Management  Type: Established Patient  Setting: Ambulatory outpatient facility  Specialty designation: 09  Referring Prov.: Steva Clotilda DEL, NP  Location: Outpatient office facility       History of present illness (HPI) [name], a 58 y.o. year old female, is here today because of her Rotator cuff arthropathy of right shoulder [M12.811]. Ms. Nugent primary complain today is Neck Pain  Pertinent problems: Tammy Young has a Chronic pain syndrome; Fibromyalgia; Chronic pain of both shoulders; History of rotator cuff surgery (Right); Rotator cuff arthropathy of right shoulder; Suprascapular nerve entrapment (Left); and S/P Cervical spinal fusion on their pertinent problem list. Pain Assessment: Severity of Chronic pain is reported as a 7 /10. Location: Neck Mid, Right, Left/,up back of head and shoulders bilateral down arms bilateral to elbow. Onset: More than a month ago. Quality: Aching, Discomfort, Restless. Timing: Constant. Modifying factor(s): medications, rest, u. Vitals:  height is 5' 4 (1.626 m) and weight is 155 lb (70.3 kg). Her temperature is 97 F (36.1 C) (abnormal). Her blood pressure is 134/82 and her pulse is 88. Her respiration is 16 and oxygen saturation is 99%.  BMI: Estimated body mass index is 26.61 kg/m as calculated from the following:   Height as of this encounter: 5' 4 (1.626 m).   Weight as of this  encounter: 155 lb (70.3 kg).  Last encounter: 03/31/2024. Last procedure: Visit date not found.  Reason for encounter: medication management.   Discussed the use of AI scribe software for clinical note transcription with the patient, who gave verbal consent to proceed.  History of Present Illness   Tammy Young is a 58 year old female who presents for pain management following rotator cuff surgery.  She underwent rotator cuff surgery approximately six weeks ago, on September 23rd with Dr. Blanch, and is currently experiencing pain management issues post-surgery. Oxycodone  is more effective for her pain than hydrocodone . She has been taking oxycodone  at bedtime to avoid interference with daily activities such as driving. She has a history of fatty liver when she was overweight, but recent blood tests show no current liver issues.  We discussed initiating oxycodone  5 mg immediate release every 12 hours as needed for pain.  Discontinue hydrocodone -acetaminophen  at today's visit.  She experiences constipation as a side effect of oxycodone , which has been managed by increasing her Linzess  dosage and using Valkyria. She drinks 64 ounces of water  daily and is considering increasing her water  intake to help with bowel movements.     Pharmacotherapy Assessment   Analgesic: Oxycodone  (oxy/IR/Roxicodone ) 5 mg IR every 12 hours as needed for pain. MME=45 Hydrocodone -acetaminophen  (Norco) 5-325 mg tablet every 8 hours as needed for pain. MME=15 (Discontinue 05/25/2024) Monitoring: [place] PMP: PDMP reviewed during this encounter.       Pharmacotherapy: No side-effects or adverse reactions reported. Compliance: No problems identified. Effectiveness: Clinically acceptable.  Tammy Montie FALCON, RN  05/25/2024  8:53 AM  Sign when Signing Visit Nursing Pain Medication Assessment:  Safety  precautions to be maintained throughout the outpatient stay will include: orient to surroundings, keep bed in low position,  maintain call bell within reach at all times, provide assistance with transfer out of bed and ambulation.  Medication Inspection Compliance: Pill count conducted under aseptic conditions, in front of the patient. Neither the pills nor the bottle was removed from the patient's sight at any time. Once count was completed pills were immediately returned to the patient in their original bottle.  Medication: Hydrocodone /APAP Pill/Patch Count: 5 of 90 pills/patches remain Pill/Patch Appearance: Markings consistent with prescribed medication Bottle Appearance: Standard pharmacy container. Clearly labeled. Filled Date: 18 / 23 / 2025 Last Medication intake:  Today  Patient she has 14 tablets at home in here pill case  Patient had left rotator  cuff surgery 6 weeks ago, Oxy 6mg  for postop pain    UDS:  Summary  Date Value Ref Range Status  12/30/2023 FINAL  Final    Comment:    ==================================================================== [employer] (MW) ==================================================================== Test                             Result       Flag       Units  Drug Present and Declared for Prescription Verification   Hydrocodone                     1164         EXPECTED   ng/mg creat   Dihydrocodeine                 168          EXPECTED   ng/mg creat   Norhydrocodone                 1245         EXPECTED   ng/mg creat    Sources of hydrocodone  include scheduled prescription medications.    Dihydrocodeine and norhydrocodone are expected metabolites of    hydrocodone . Dihydrocodeine is also available as a scheduled    prescription medication.  Drug Present not Declared for Prescription Verification   7-aminoclonazepam              36           UNEXPECTED ng/mg creat    7-aminoclonazepam is an expected metabolite of clonazepam. Source of    clonazepam is a scheduled prescription  medication.  ==================================================================== Test                      Result    Flag   Units      Ref Range   Creatinine              131              mg/dL      >=79 ==================================================================== Declared Medications:  The flagging and interpretation on this report are based on the  following declared medications.  Unexpected results may arise from  inaccuracies in the declared medications.   **Note: The testing scope of this panel includes these medications:   Hydrocodone  (Norco)   **Note: The testing scope of this panel does not include the  following reported medications:   Acetaminophen  (Norco)  Benztropine  (Cogentin )  Fexofenadine (Allegra)  Fluoxetine  (Prozac )  Fluticasone (Flonase)  Hydroxyzine  (Atarax )  Ibuprofen (Advil)  Linaclotide  (Linzess )  Metformin  (Glucophage )  Multivitamin  Ondansetron  (Zofran )  Prazosin  (Minipress )  Rabeprazole (Aciphex)  Ranitidine (Zantac)  Risperidone  (Risperdal )  Semaglutide [facility])  Supplement  Valacyclovir  (Valtrex )  Vitamin D2 ==================================================================== For clinical consultation, please call [phone]. ====================================================================     No results found for: CBDTHCR No results found for: D8THCCBX No results found for: D9THCCBX  ROS  Constitutional: Denies any fever or chills Gastrointestinal: No reported hemesis, hematochezia, vomiting, or acute GI distress Musculoskeletal: Neck Pain Neurological: No reported episodes of acute onset apraxia, aphasia, dysarthria, agnosia, amnesia, paralysis, loss of coordination, or loss of consciousness  Medication Review  FLUoxetine , HYDROcodone -acetaminophen , Multiple Vitamins-Minerals, RABEprazole, acetaminophen , benztropine , ergocalciferol , fexofenadine, fluticasone, hydrOXYzine , linaclotide , metFORMIN , mirtazapine ,  ondansetron , oxyCODONE , prazosin , pregabalin, ranitidine, risperiDONE , semaglutide-weight management, and valACYclovir   History Review  Allergy: Ms. Brannum is allergic to bee venom, nsaids, amoxicillin, and augmentin [amoxicillin-pot clavulanate]. Drug: Ms. Cieslak  reports no history of drug use. Alcohol:  reports no history of alcohol use. Tobacco:  reports that she has never smoked. She has been exposed to tobacco smoke. She has never used smokeless tobacco. Social: Ms. Sandles  reports that she has never smoked. She has been exposed to tobacco smoke. She has never used smokeless tobacco. She reports that she does not drink alcohol and does not use drugs. Medical:  has a past medical history of Anxiety and depression, Arthritis, Chronic constipation, Chronic cough, Chronic kidney disease (CKD), Collagen vascular disease, COPD (chronic obstructive pulmonary disease) (HCC), CTEPH (chronic thromboembolic pulmonary hypertension) (HCC), Fatty liver disease, nonalcoholic, Fibromyalgia, GERD (gastroesophageal reflux disease), Headache, Hemorrhoids, Herpes simplex vulvovaginitis, Hiatal hernia, History of kidney stones, History of stomach ulcers, HLD (hyperlipidemia), Hypertension, Hypothyroidism, unspecified (10/23/2012), Liver disease, Lymphedema of right lower extremity, Morbid obesity (HCC), Multiple gastric ulcers, Multiple gastric ulcers, Obstructive sleep apnea syndrome (10/18/2017), Personal history of COVID-19 (12/2020), PONV (postoperative nausea and vomiting), Pre-diabetes, Pulmonary embolism (HCC), Renal disorder, Seizures (HCC), Stress incontinence (female) (female), Stroke (HCC), Vitamin B 12 deficiency, and Vitamin D  deficiency. Surgical: Ms. 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 Family: family history includes Alcohol abuse in her father; Bipolar disorder in her mother; COPD in her mother; Diabetes in her maternal grandmother and mother; Drug abuse in her mother; Kidney Stones in her father and mother; Migraines in her maternal grandmother; Prostate cancer in her maternal grandfather; Stroke in her maternal grandmother.  Laboratory Chemistry Profile   Renal Lab Results  Component Value Date   BUN 9 04/08/2024   CREATININE 0.82 04/08/2024   GFRAA >60 02/17/2020   GFRNONAA >60 04/08/2024    Hepatic Lab Results  Component Value Date   AST 56 (H) 04/05/2021   ALT 54 (H) 04/05/2021   ALBUMIN 3.7 04/05/2021   ALKPHOS 51 04/05/2021   LIPASE 28 10/08/2016    Electrolytes Lab Results  Component Value Date   NA 143 04/08/2024   K 3.7 04/08/2024   CL 109 04/08/2024   CALCIUM  9.2 04/08/2024  MG 2.3 04/05/2021    Bone No results found for: VD25OH, [id number], CI6874NY7, CI7874NY7, 25OHVITD1, 25OHVITD2, 25OHVITD3, TESTOFREE, TESTOSTERONE  Inflammation (CRP: Acute Phase) (ESR: Chronic Phase) No results found for: CRP, ESRSEDRATE, LATICACIDVEN       Note: Above Lab results reviewed.  Recent Imaging Review   US  OR NERVE BLOCK-IMAGE ONLY ([facility]) There is no interpretation for this exam.    This order is for images obtained during a surgical procedure.  Please See  Surgeries Tab for more information regarding the procedure. Note: Reviewed        Physical Exam  Vitals: BP 134/82   Pulse 88   Temp (!) 97 F (36.1 C)   Resp 16   Ht 5' 4 (1.626 m)   Wt 155 lb (70.3 kg)   SpO2 99%   BMI 26.61 kg/m  BMI: Estimated body mass index is 26.61 kg/m as calculated from the following:   Height as of this encounter: 5' 4 (1.626 m).   Weight as of this encounter: 155 lb (70.3 kg). Ideal: Ideal body weight: 54.7 kg (120 lb 9.5 oz) Adjusted ideal body weight: 60.9 kg (134 lb 5.7 oz) General appearance: Well nourished, well developed, and well hydrated. In no apparent acute distress Mental status: Alert, oriented x 3 (person, place, & time)       Respiratory: No evidence of acute respiratory distress Eyes: PERLA  Musculoskeletal: neck pain Cervical Spine Exam  Skin & Axial Inspection: No masses, redness, edema, swelling, or associated skin lesions Alignment: Symmetrical Functional ROM: Pain restricted ROM      Stability: No instability detected Muscle Tone/Strength: Functionally intact. No obvious neuro-muscular anomalies detected. Sensory (Neurological): Musculoskeletal pain pattern Palpation: No palpable anomalies             Assessment   Diagnosis Status  1. Rotator cuff arthropathy of right shoulder   2. Chronic pain syndrome   3. Fibromyalgia   4. Chronic pain of both shoulders   5. S/P cervical spinal fusion   6. Medication management    Controlled Controlled Controlled   Updated Problems: No problems updated.  Plan of Care  Problem-specific:  Assessment and Plan    Pain management following right rotator cuff repair Post-operative pain management ongoing. Oxycodone  provides better control than hydrocodone . No liver issues currently. - Discontinue hydrocodone  after  current supply. - Prescribe oxycodone  5 mg every 12 hours for two months. - Re-evaluate pain management in December. - Advise covering surgical area to protect from cold.  Chronic pain syndrome: Started oxycodone  (oxy IR/Roxicodone ) 5 mg immediate release every 12 hours as needed for pain. Discontinue hydrocodone -acetaminophen  Patient's pain is well-controlled with oxycodone , will continue on current medication regimen.  Prescribing drug monitoring (PMP) reviewed, findings consistent with the use of prescribed medication and no evidence of other narcotic misuse or abuse.  Urine drug screening (UDS) up to date.  The patient has not reported any side effects or adverse reaction from oxycodone . Schedule follow-up in 60 days for medication management.  Constipation managed with Linzess  and Valkyria. - Increase water  intake and dietary fiber. - Recommend over-the-counter stool softeners if needed. - Advise drinking room temperature water .  Fibromyalgia: Currently managed with oxycodone  5 mg immediate release.  Medications   oxyCODONE  (OXY IR/ROXICODONE ) 5 MG immediate release tablet    Sig: Take 1 tablet (5 mg total) by mouth every 12 (twelve) hours as needed for severe pain (pain score 7-10). Must last 30 days.    Dispense:  60 tablet  Refill:  0    Chronic Pain: STOP Act (Not applicable) Fill 1 day early if closed on refill date. Avoid benzodiazepines within 8 hours of opioids   oxyCODONE  (OXY IR/ROXICODONE ) 5 MG immediate release tablet    Sig: Take 1 tablet (5 mg total) by mouth every 12 (twelve) hours as needed for severe pain (pain score 7-10). Must last 30 days.    Dispense:  60 tablet    Refill:  0    Chronic Pain: STOP Act (Not applicable) Fill 1 day early if closed on refill date. Avoid benzodiazepines within 8 hours of opioids        [name] has a current medication list which includes the following long-term medication(s): benztropine , fluoxetine , fluticasone,  metformin , mirtazapine , pregabalin, rabeprazole, risperidone , [DISCONTINUED] fexofenadine, and [DISCONTINUED] ranitidine.  Pharmacotherapy (Medications Ordered): Meds ordered this encounter  Medications   oxyCODONE  (OXY IR/ROXICODONE ) 5 MG immediate release tablet    Sig: Take 1 tablet (5 mg total) by mouth every 12 (twelve) hours as needed for severe pain (pain score 7-10). Must last 30 days.    Dispense:  60 tablet    Refill:  0    Chronic Pain: STOP Act (Not applicable) Fill 1 day early if closed on refill date. Avoid benzodiazepines within 8 hours of opioids   oxyCODONE  (OXY IR/ROXICODONE ) 5 MG immediate release tablet    Sig: Take 1 tablet (5 mg total) by mouth every 12 (twelve) hours as needed for severe pain (pain score 7-10). Must last 30 days.    Dispense:  60 tablet    Refill:  0    Chronic Pain: STOP Act (Not applicable) Fill 1 day early if closed on refill date. Avoid benzodiazepines within 8 hours of opioids   Orders:  No orders of the defined types were placed in this encounter.       Return in about 2 months (around 07/25/2024) for (F2F), (MM), Emmy Blanch NP.    Recent Visits Date Type Provider Dept  03/31/24 Office Visit Reyn Faivre K, NP [facility]  Showing recent visits within past 90 days and meeting all other requirements Today's Visits Date Type Provider Dept  05/25/24 Office Visit Eldana Isip K, NP [facility]  Showing today's visits and meeting all other requirements Future Appointments Date Type Provider Dept  07/22/24 Appointment Concetta Guion K, NP [facility]  Showing future appointments within next 90 days and meeting all other requirements  I discussed the assessment and treatment plan with the patient. The patient was provided an opportunity to ask questions and all were answered. The patient agreed with the plan and demonstrated an understanding of the instructions.  Patient advised to call back or seek an in-person  evaluation if the symptoms or condition worsens.  I personally spent a total of 30 minutes in the care of the patient today including preparing to see the patient, getting/reviewing separately obtained history, performing a medically appropriate exam/evaluation, counseling and educating, placing orders, referring and communicating with other health care professionals, documenting clinical information in the EHR, independently interpreting results, communicating results, and coordinating care.   Note by: Kaveon Blatz K Kimberla Driskill, NP (TTS and AI technology used. I apologize for any typographical errors that were not detected and corrected.) Date: 05/25/2024; Time: 11:19 AM

## 2024-05-25 NOTE — Progress Notes (Signed)
 Nursing Pain Medication Assessment:  Safety precautions to be maintained throughout the outpatient stay will include: orient to surroundings, keep bed in low position, maintain call bell within reach at all times, provide assistance with transfer out of bed and ambulation.  Medication Inspection Compliance: Pill count conducted under aseptic conditions, in front of the patient. Neither the pills nor the bottle was removed from the patient's sight at any time. Once count was completed pills were immediately returned to the patient in their original bottle.  Medication: Hydrocodone /APAP Pill/Patch Count: 5 of 90 pills/patches remain Pill/Patch Appearance: Markings consistent with prescribed medication Bottle Appearance: Standard pharmacy container. Clearly labeled. Filled Date: 6 / 23 / 2025 Last Medication intake:  Today  Patient she has 14 tablets at home in here pill case  Patient had left rotator  cuff surgery 6 weeks ago, Oxy 6mg  for postop pain

## 2024-05-27 ENCOUNTER — Other Ambulatory Visit: Payer: Self-pay | Admitting: Psychiatry

## 2024-05-27 MED ORDER — FLUOXETINE HCL 20 MG PO CAPS: 20.0000 mg | ORAL_CAPSULE | Freq: Every day | ORAL | 0 refills | Status: DC

## 2024-05-28 ENCOUNTER — Other Ambulatory Visit: Payer: Self-pay | Admitting: Psychiatry
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
## 2024-06-02 ENCOUNTER — Ambulatory Visit (INDEPENDENT_AMBULATORY_CARE_PROVIDER_SITE_OTHER): Admitting: Professional Counselor

## 2024-06-02 DIAGNOSIS — F3161 Bipolar disorder, current episode mixed, mild: Secondary | ICD-10-CM | POA: Diagnosis not present

## 2024-06-02 DIAGNOSIS — F4310 Post-traumatic stress disorder, unspecified: Secondary | ICD-10-CM | POA: Diagnosis not present

## 2024-06-02 DIAGNOSIS — F331 Major depressive disorder, recurrent, moderate: Secondary | ICD-10-CM

## 2024-06-02 NOTE — Progress Notes (Signed)
  THERAPIST PROGRESS NOTE  Session Time: 9:00 AM - 9:53 AM   Participation Level: Active  Behavioral Response: Well Groomed, Alert, Dysphoric  Type of Therapy: Individual Therapy  Treatment Goals addressed:  Active OP Depression  LTG: Not feeling that myself and everybody else around me would be better off if I was dead.                 Start:  12-28-2023    Expected End:  12/07/24      STG: To try to get out of the house more. To improve socialization AEB reducing depression sxs, increasing network and engaging in activities once a week for the next 12 weeks.     STG: I would say not to stress about things I can't change. To improve stress management AEB utilizing coping mechanisms 3 out of 7 days for the next 90 days.  ProgressTowards Goals: Progressing  Interventions: CBT, Motivational Interviewing, and Supportive  Summary: Tammy Young is a 58 y.o. female who presents with a history of depression, bipolar disorder, and PTSD. She appeared alert and oriented x5. She stated she had surgery on her shoulder. She is technically still in recovery, doing PT. [name] stated things have been smoother at home, but noted part of this is because she chooses not to speak up about things that bother her with her son anymore. She agreed it may be helpful to use similar tactic when dealing with other people as well. [name] was receptive to psychoeducation. She noted she still struggles with what other people think though.   Therapist Response: Conducted session with [name]. Began session with check-in/update since previous session. Utilized empathetic and reflective listening. Used open-ended questions to facilitate discussion and summarized [name]'s thoughts/feelings. Reminded [name] to focus on what's within her control and to consider similar reactions to others as she does with her son. Provided psychoeducation on natural vs manufactured emotions. Scheduled additional appointment and  concluded session.   Suicidal/Homicidal: No  Plan: Return again in 3 weeks.  Diagnosis: Bipolar disorder, current episode mixed, mild (HCC) [F31.61]  PTSD (post-traumatic stress disorder)  MDD (major depressive disorder), recurrent episode, moderate (HCC)  Collaboration of Care: Medication Management AEB chart review  Patient/Guardian was advised Release of Information must be obtained prior to any record release in order to collaborate their care with an outside provider. Patient/Guardian was advised if they have not already done so to contact the registration department to sign all necessary forms in order for us  to release information regarding their care.   Consent: Patient/Guardian gives verbal consent for treatment and assignment of benefits for services provided during this visit. Patient/Guardian expressed understanding and agreed to proceed.   [name], [facility] 06/02/2024

## 2024-06-04 ENCOUNTER — Encounter: Payer: Self-pay | Admitting: Psychiatry

## 2024-06-04 ENCOUNTER — Other Ambulatory Visit: Payer: Self-pay

## 2024-06-04 ENCOUNTER — Ambulatory Visit (INDEPENDENT_AMBULATORY_CARE_PROVIDER_SITE_OTHER): Admitting: Psychiatry

## 2024-06-04 VITALS — BP 120/84 | HR 97 | Temp 96.9°F | Ht 64.0 in | Wt 151.6 lb

## 2024-06-04 DIAGNOSIS — F3161 Bipolar disorder, current episode mixed, mild: Secondary | ICD-10-CM | POA: Diagnosis not present

## 2024-06-04 DIAGNOSIS — F4310 Post-traumatic stress disorder, unspecified: Secondary | ICD-10-CM | POA: Diagnosis not present

## 2024-06-04 DIAGNOSIS — G2571 Drug induced akathisia: Secondary | ICD-10-CM

## 2024-06-04 MED ORDER — METFORMIN HCL 500 MG PO TABS: 500.0000 mg | ORAL_TABLET | Freq: Every day | ORAL | 0 refills | Status: AC

## 2024-06-04 MED ORDER — BENZTROPINE MESYLATE 1 MG PO TABS: 1.0000 mg | ORAL_TABLET | Freq: Every day | ORAL | 0 refills | Status: AC

## 2024-06-04 MED ORDER — PRAZOSIN HCL 2 MG PO CAPS: 2.0000 mg | ORAL_CAPSULE | Freq: Every day | ORAL | 0 refills | Status: DC

## 2024-06-04 NOTE — Patient Instructions (Addendum)
 Continue risperidone  2 mg  Continue metformin  500 mg at night  Increase fluoxetine  40 mg daily  Continue benztropine  1 mg at night  Continue prazosin  2 mg  Continue hydroxyzine  50 mg in AM- 25 mg in PM a day as needed for anxiety  Next appointment: 12/30 at 4 pm

## 2024-06-23 ENCOUNTER — Ambulatory Visit: Admitting: Professional Counselor

## 2024-07-01 ENCOUNTER — Ambulatory Visit: Admitting: Professional Counselor
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
## 2024-07-19 NOTE — Progress Notes (Unsigned)
 PROVIDER NOTE: Interpretation of information contained herein should be left to medically-trained personnel. Specific patient instructions are provided elsewhere under Patient Instructions section of medical record. This document was created in part using AI and STT-dictation technology, any transcriptional errors that may result from this process are unintentional.  Patient: Tammy Young  Service: E/M   PCP: Tammy Clotilda DEL, NP  DOB: April 25, 1966  DOS: 07/21/2024  Provider: Emmy MARLA Blanch, NP  MRN: [id number]  Delivery: Face-to-face  Specialty: Interventional Pain Management  Type: Established Patient  Setting: Ambulatory outpatient facility  Specialty designation: 09  Referring Prov.: Tammy Clotilda DEL, NP  Location: Outpatient office facility       History of present illness (HPI) Tammy Young, a 58 y.o. year old female, is here today because of her Rotator cuff arthropathy of right shoulder [M12.811]. [name] primary complain today is Neck Pain (Bilateral ) and Shoulder Pain (Right s/p rotator cuff repair 2023/Left s/p rotator cuff repair 09/25)  Pertinent problems: Tammy Young has Intractable chronic migraine without aura and with status migrainosus; Intractable pain; Obesity, Class III, BMI 40-49.9 (morbid obesity) (HCC); Carpal tunnel syndrome; Chronic renal disease, stage 3, moderately decreased glomerular filtration rate between 30-59 mL/min/1.73 square meter (HCC); Thoracic back pain; Osteopenia of multiple sites; Chronic pain syndrome; Chronic bilateral low back pain without sciatica; SI (sacroiliac) joint inflammation; Bilateral hip pain; 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  Pain  Assessment: Severity of Chronic pain is reported as a 6 /10. Location: Neck (shoulder) Right, Left/down the arms to the fingers. Onset: More than a month ago. Quality: Discomfort, Sharp, Numbness, Tingling, Nagging, Constant. Timing: Constant. Modifying factor(s): feeling of heaviness from neck, hard to lift head from a reclined position. Vitals:  height is 5' 4 (1.626 m) and weight is 155 lb (70.3 kg). Her temporal temperature is 97.5 F (36.4 C) (abnormal). Her blood pressure is 120/74 and her pulse is 93. Her respiration is 16 and oxygen saturation is 99%.  BMI: Estimated body mass index is 26.61 kg/m as calculated from the following:   Height as of this encounter: 5' 4 (1.626 m).   Weight as of this encounter: 155 lb (70.3 kg).  Last encounter: 05/25/2024. Last procedure: Visit date not found.  Reason for encounter: medication management. No change in medical history since last visit.  Patient's pain is at baseline.  Patient continues multimodal pain regimen as prescribed.  States that it provides pain relief and improvement in functional status.   Discussed the use of AI scribe software for clinical note transcription with the patient, who gave verbal consent to proceed.  History of Present Illness   Tammy Young is a 58 year old female who presents with post-operative pain management following rotator cuff surgery.  She underwent rotator cuff surgery in September and is currently experiencing significant pain, particularly on days when she attends physical therapy sessions, which are on Tuesdays and Thursdays. The pain is more pronounced after these sessions, necessitating additional pain management.  Post-surgery, she was initially prescribed hydrocodone , which she has since completed. She is currently taking oxycodone  5 mg twice daily, at 8 AM and 8 PM. She needs an additional dose during the day due to prolonged periods without medication, especially on physical therapy days.  She  reports that she has good motion and is now able to lift up to three pounds during physical therapy. She is approximately eight weeks into a twelve-week physical therapy program.     Pharmacotherapy Assessment   Analgesic: Oxycodone  (oxy/IR/Roxicodone ) 5 mg IR every 8 hours as needed for pain. MME=15 (just for 2 months only due to recovery from post op) Monitoring: [place] PMP: PDMP reviewed during this encounter.       Pharmacotherapy: No side-effects or adverse reactions reported. Compliance: No problems identified. Effectiveness: Clinically acceptable.  Tammy Chrissie MATSU, RN  07/21/2024  1:19 PM  Sign when Signing Visit Nursing Pain Medication Assessment:  Safety precautions to be maintained throughout the outpatient stay will include: orient to surroundings, keep bed in low position, maintain call bell within reach at all times, provide assistance with transfer out of bed and ambulation.  Medication Inspection Compliance: Pill count conducted under aseptic conditions, in front of the patient. Neither the pills nor the bottle was removed from the patient's sight at any time. Once count was completed pills were immediately returned to the patient in their original bottle.  Medication: Oxycodone  IR Pill/Patch Count: 20 of 60 pills/patches remain Pill/Patch Appearance: Markings consistent with prescribed medication Bottle Appearance: Standard pharmacy container. Clearly labeled. Filled Date: 13 / 05 / 2025 Last Medication intake:  Today    UDS:  Summary  Date Value Ref Range Status  12/30/2023 FINAL  Final    Comment:    ==================================================================== [employer] [facility] (MW) ==================================================================== Test                             Result       Flag       Units  Drug Present and Declared for Prescription Verification   Hydrocodone                     1164         EXPECTED   ng/mg creat   Dihydrocodeine                  168          EXPECTED   ng/mg creat   Norhydrocodone                 1245         EXPECTED   ng/mg creat    Sources of hydrocodone  include scheduled prescription medications.    Dihydrocodeine and norhydrocodone are expected metabolites of    hydrocodone . Dihydrocodeine is also available as a scheduled    prescription medication.  Drug Present not Declared for Prescription Verification   7-aminoclonazepam              36           UNEXPECTED ng/mg creat    7-aminoclonazepam is an expected metabolite of clonazepam. Source of    clonazepam is a scheduled prescription medication.  ==================================================================== Test                      Result    Flag   Units      Ref Range   Creatinine              131              mg/dL      >=79 ==================================================================== Declared Medications:  The flagging and interpretation on this report are  based on the  following declared medications.  Unexpected results may arise from  inaccuracies in the declared medications.   **Note: The testing scope of this panel includes these medications:   Hydrocodone  (Norco)   **Note: The testing scope of this panel does not include the  following reported medications:   Acetaminophen  (Norco)  Benztropine  (Cogentin )  Fexofenadine (Allegra)  Fluoxetine  (Prozac )  Fluticasone (Flonase)  Hydroxyzine  (Atarax )  Ibuprofen (Advil)  Linaclotide  (Linzess )  Metformin  (Glucophage )  Multivitamin  Ondansetron  (Zofran )  Prazosin  (Minipress )  Rabeprazole (Aciphex)  Ranitidine (Zantac)  Risperidone  (Risperdal )  Semaglutide [facility])  Supplement  Valacyclovir  (Valtrex )  Vitamin D2 ==================================================================== For clinical consultation, please call [phone]. ====================================================================     No results found for: CBDTHCR No results found  for: D8THCCBX No results found for: D9THCCBX  ROS  Constitutional: Denies any fever or chills Gastrointestinal: No reported hemesis, hematochezia, vomiting, or acute GI distress Musculoskeletal:  Neck Pain (Bilateral ) and Shoulder Pain  Neurological: No reported episodes of acute onset apraxia, aphasia, dysarthria, agnosia, amnesia, paralysis, loss of coordination, or loss of consciousness  Medication Review  FLUoxetine , Multiple Vitamins-Minerals, RABEprazole, Vonoprazan Fumarate, acetaminophen , benztropine , cyanocobalamin , ergocalciferol , fexofenadine, fluticasone, hydrOXYzine , linaclotide , metFORMIN , ondansetron , oxyCODONE , phentermine, prazosin , pregabalin, ranitidine, risperiDONE , semaglutide-weight management, topiramate , and valACYclovir   History Review  Allergy: Tammy Young is allergic to bee venom, nsaids, amoxicillin, and augmentin [amoxicillin-pot clavulanate]. Drug: Tammy Young  reports no history of drug use. Alcohol:  reports no history of alcohol use. Tobacco:  reports that she has never smoked. She has been exposed to tobacco smoke. She has never used smokeless tobacco. Social: Tammy Young  reports that she has never smoked. She has been exposed to tobacco smoke. She has never used smokeless tobacco. She reports that she does not drink alcohol and does not use drugs. Medical:  has a past medical history of Anxiety and depression, Arthritis, Chronic constipation, Chronic cough, Chronic kidney disease (CKD), Collagen vascular disease, COPD (chronic obstructive pulmonary disease) (HCC), CTEPH (chronic thromboembolic pulmonary hypertension) (HCC), Fatty liver disease, nonalcoholic, Fibromyalgia, GERD (gastroesophageal reflux disease), Headache, Hemorrhoids, Herpes simplex vulvovaginitis, Hiatal hernia, History of kidney stones, History of stomach ulcers, HLD (hyperlipidemia), Hypertension, Hypothyroidism, unspecified (10/23/2012), Liver disease, Lymphedema of right lower extremity,  Morbid obesity (HCC), Multiple gastric ulcers, Multiple gastric ulcers, Obstructive sleep apnea syndrome (10/18/2017), Personal history of COVID-19 (12/2020), PONV (postoperative nausea and vomiting), Pre-diabetes, Pulmonary embolism (HCC), Renal disorder, Seizures (HCC), Stress incontinence (female) (female), Stroke (HCC), Vitamin B 12 deficiency, and Vitamin D  deficiency. 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 Family: family history includes Alcohol abuse in her father; Bipolar disorder in her mother; COPD in her mother; Diabetes in her maternal grandmother and mother; Drug abuse in her mother; Kidney Stones in her father and mother; Migraines in her maternal grandmother; Prostate cancer in her maternal grandfather; Stroke in her maternal grandmother.  Laboratory Chemistry Profile  Renal Lab Results  Component Value Date   BUN 9 04/08/2024   CREATININE 0.82 04/08/2024   GFRAA >60 02/17/2020   GFRNONAA >60 04/08/2024     Hepatic Lab Results  Component Value Date   AST 56 (H) 04/05/2021   ALT 54 (H) 04/05/2021   ALBUMIN 3.7 04/05/2021   ALKPHOS 51 04/05/2021   LIPASE 28 10/08/2016    Electrolytes Lab Results  Component Value Date   NA 143 04/08/2024   K 3.7 04/08/2024   CL 109 04/08/2024   CALCIUM  9.2 04/08/2024   MG 2.3 04/05/2021    Bone No results found for: VD25OH, VD125OH2TOT, CI6874NY7, CI7874NY7, 25OHVITD1, 25OHVITD2, 25OHVITD3, TESTOFREE, TESTOSTERONE  Inflammation (CRP: Acute Phase) (ESR: Chronic Phase) No results found for: CRP, ESRSEDRATE, LATICACIDVEN       Note: Above Lab results reviewed.  Recent Imaging Review  US  OR NERVE BLOCK-IMAGE ONLY ([facility]) There is no interpretation for this exam.    This order is for images obtained during a surgical procedure.  Please See  Surgeries Tab for more information regarding the procedure. Note: Reviewed        Physical Exam  Vitals: BP 120/74 (BP Location: Right Arm, Patient Position: Sitting, Cuff Size: Normal)   Pulse 93   Temp (!) 97.5 F (36.4 C) (Temporal)   Resp 16   Ht 5' 4 (1.626 m)   Wt 155 lb (70.3 kg)   SpO2 99%   BMI 26.61 kg/m  BMI: Estimated body mass index is 26.61 kg/m as calculated from the following:   Height as of this encounter: 5' 4 (1.626 m).   Weight as of this encounter: 155 lb (70.3 kg). Ideal: Ideal body weight: 54.7 kg (120 lb 9.5 oz) Adjusted ideal body weight: 60.9 kg (134 lb 5.7 oz) General appearance: Well nourished, well developed, and well hydrated. In no apparent acute distress Mental status: Alert, oriented x 3 (person, place, & time)       Respiratory: No evidence of acute respiratory distress Eyes: PERLA  Musculoskeletal: Neck pain, right shoulder pain Assessment   Diagnosis Status  1. Rotator cuff arthropathy of right shoulder   2. Medication management   3. Chronic pain syndrome   4. Fibromyalgia   5. Chronic pain of both shoulders   6. S/P cervical  spinal fusion   7. Chronic right shoulder pain    Improving Controlled Controlled   Updated Problems: Problem  Chronic Right Shoulder Pain    Plan of Care  Problem-specific:  Assessment and Plan    Right rotator cuff arthropathy with postoperative pain Postoperative pain persists, exacerbated by physical therapy and cold weather. Current oxycodone  regimen adjusted for increased pain during physical therapy. - Increased oxycodone  to three times daily for two months only due to physical therapy (Post op) - Reassess pain management plan after two months; consider returning to twice daily dosing.  Chronic opioid therapy for pain management Chronic opioid therapy effective for neck pain. Adjustment needed for increased pain during physical therapy and weather changes. - Continue oxycodone  5 mg three times daily for two months. - Reassess opioid therapy after two months; consider returning to twice daily dosing.   Medication management: Patient's pain is well-controlled with oxycodone , will continue on current medication regimen.  Prescribing drug monitoring (PMP) reviewed, findings consistent with the use of prescribed medication and no evidence of other narcotic misuse or abuse.  Urine drug screening (UDS) up to date.  The patient has not reported any side effects or adverse  reaction from oxycodone . Schedule follow-up in 60 days for medication management.   - Increase water  intake and dietary fiber. - Recommend over-the-counter stool softeners if needed. - Advise drinking room temperature water .Constipation managed with Linzess  and [place].      Tammy Young has a current medication list which includes the following long-term medication(s): benztropine , fluoxetine , metformin , prazosin , pregabalin, rabeprazole, risperidone , fluticasone, pregabalin, [DISCONTINUED] fexofenadine, and [DISCONTINUED] ranitidine.  Pharmacotherapy (Medications Ordered): Meds ordered this encounter   Medications   oxyCODONE  (OXY IR/ROXICODONE ) 5 MG immediate release tablet    Sig: Take 1 tablet (5 mg total) by mouth every 8 (eight) hours as needed for severe pain (pain score 7-10). Must last 30 days.    Dispense:  90 tablet    Refill:  0    Chronic Pain: STOP Act (Not applicable) Fill 1 day early if closed on refill date. Avoid benzodiazepines within 8 hours of opioids   oxyCODONE  (OXY IR/ROXICODONE ) 5 MG immediate release tablet    Sig: Take 1 tablet (5 mg total) by mouth every 8 (eight) hours as needed for severe pain (pain score 7-10). Must last 30 days.    Dispense:  90 tablet    Refill:  0    Chronic Pain: STOP Act (Not applicable) Fill 1 day early if closed on refill date. Avoid benzodiazepines within 8 hours of opioids   Orders:  No orders of the defined types were placed in this encounter.       Return in about 2 months (around 09/19/2024) for (F2F), (MM), Tammy Blanch NP.    Recent Visits Date Type Provider Dept  05/25/24 Office Visit Dsean Vantol K, NP [facility]  Showing recent visits within past 90 days and meeting all other requirements Today's Visits Date Type Provider Dept  07/21/24 Office Visit Cas Tracz K, NP [facility]  Showing today's visits and meeting all other requirements Future Appointments No visits were found meeting these conditions. Showing future appointments within next 90 days and meeting all other requirements  I discussed the assessment and treatment plan with the patient. The patient was provided an opportunity to ask questions and all were answered. The patient agreed with the plan and demonstrated an understanding of the instructions.  Patient advised to call back or seek an in-person evaluation if the symptoms or condition worsens.  I personally spent a total of 30 minutes in the care of the patient today including preparing to see the patient, getting/reviewing separately obtained history, performing a medically  appropriate exam/evaluation, counseling and educating, placing orders, referring and communicating with other health care professionals, documenting clinical information in the EHR, independently interpreting results, communicating results, and coordinating care.  Note by: Aviyana Sonntag K Draxton Luu, NP (TTS and AI technology used. I apologize for any typographical errors that were not detected and corrected.) Date: 07/21/2024; Time: 1:46 PM

## 2024-07-21 ENCOUNTER — Ambulatory Visit: Attending: Nurse Practitioner | Admitting: Nurse Practitioner

## 2024-07-21 ENCOUNTER — Ambulatory Visit (INDEPENDENT_AMBULATORY_CARE_PROVIDER_SITE_OTHER): Admitting: Psychiatry

## 2024-07-21 ENCOUNTER — Encounter: Payer: Self-pay | Admitting: Psychiatry

## 2024-07-21 ENCOUNTER — Encounter: Payer: Self-pay | Admitting: Nurse Practitioner

## 2024-07-21 ENCOUNTER — Other Ambulatory Visit: Payer: Self-pay

## 2024-07-21 VITALS — BP 120/74 | HR 93 | Temp 97.5°F | Resp 16 | Ht 64.0 in | Wt 155.0 lb

## 2024-07-21 VITALS — BP 121/80 | HR 75 | Temp 97.4°F | Ht 64.0 in | Wt 160.4 lb

## 2024-07-21 DIAGNOSIS — F3161 Bipolar disorder, current episode mixed, mild: Secondary | ICD-10-CM

## 2024-07-21 DIAGNOSIS — M12811 Other specific arthropathies, not elsewhere classified, right shoulder: Secondary | ICD-10-CM | POA: Diagnosis not present

## 2024-07-21 DIAGNOSIS — G894 Chronic pain syndrome: Secondary | ICD-10-CM | POA: Diagnosis not present

## 2024-07-21 DIAGNOSIS — M797 Fibromyalgia: Secondary | ICD-10-CM | POA: Diagnosis not present

## 2024-07-21 DIAGNOSIS — M25512 Pain in left shoulder: Secondary | ICD-10-CM | POA: Insufficient documentation

## 2024-07-21 DIAGNOSIS — G2571 Drug induced akathisia: Secondary | ICD-10-CM | POA: Diagnosis not present

## 2024-07-21 DIAGNOSIS — Z981 Arthrodesis status: Secondary | ICD-10-CM | POA: Diagnosis not present

## 2024-07-21 DIAGNOSIS — M25511 Pain in right shoulder: Secondary | ICD-10-CM | POA: Insufficient documentation

## 2024-07-21 DIAGNOSIS — G8929 Other chronic pain: Secondary | ICD-10-CM | POA: Insufficient documentation

## 2024-07-21 DIAGNOSIS — Z79899 Other long term (current) drug therapy: Secondary | ICD-10-CM | POA: Insufficient documentation

## 2024-07-21 DIAGNOSIS — F4310 Post-traumatic stress disorder, unspecified: Secondary | ICD-10-CM | POA: Diagnosis not present

## 2024-07-21 MED ORDER — PRAZOSIN HCL 2 MG PO CAPS: 2.0000 mg | ORAL_CAPSULE | Freq: Every day | ORAL | 0 refills | Status: AC

## 2024-07-21 MED ORDER — OXYCODONE HCL 5 MG PO TABS: 5.0000 mg | ORAL_TABLET | Freq: Three times a day (TID) | ORAL | 0 refills | Status: AC | PRN

## 2024-07-21 MED ORDER — RISPERIDONE 2 MG PO TABS: 2.0000 mg | ORAL_TABLET | Freq: Every day | ORAL | 0 refills | Status: AC

## 2024-07-21 MED ORDER — HYDROXYZINE HCL 25 MG PO TABS: ORAL_TABLET | ORAL | 0 refills | Status: AC

## 2024-07-21 NOTE — Progress Notes (Signed)
 Nursing Pain Medication Assessment:  Safety precautions to be maintained throughout the outpatient stay will include: orient to surroundings, keep bed in low position, maintain call bell within reach at all times, provide assistance with transfer out of bed and ambulation.  Medication Inspection Compliance: Pill count conducted under aseptic conditions, in front of the patient. Neither the pills nor the bottle was removed from the patient's sight at any time. Once count was completed pills were immediately returned to the patient in their original bottle.  Medication: Oxycodone  IR Pill/Patch Count: 20 of 60 pills/patches remain Pill/Patch Appearance: Markings consistent with prescribed medication Bottle Appearance: Standard pharmacy container. Clearly labeled. Filled Date: 59 / 05 / 2025 Last Medication intake:  Today

## 2024-07-21 NOTE — Patient Instructions (Signed)

## 2024-07-22 ENCOUNTER — Encounter: Admitting: Nurse Practitioner

## 2024-08-15 ENCOUNTER — Other Ambulatory Visit: Payer: Self-pay | Admitting: Psychiatry

## 2024-08-21 ENCOUNTER — Other Ambulatory Visit: Payer: Self-pay | Admitting: Psychiatry

## 2024-08-21 MED ORDER — FLUOXETINE HCL 40 MG PO CAPS: 40.0000 mg | ORAL_CAPSULE | Freq: Every day | ORAL | 0 refills | Status: AC

## 2024-08-25 ENCOUNTER — Ambulatory Visit: Admitting: Professional Counselor

## 2024-09-03 ENCOUNTER — Ambulatory Visit: Admitting: Professional Counselor

## 2024-09-07 ENCOUNTER — Ambulatory Visit: Admitting: Psychiatry

## 2024-09-15 ENCOUNTER — Encounter: Admitting: Nurse Practitioner
